# Patient Record
Sex: Male | Born: 1950
Health system: Southern US, Community
[De-identification: ages and names within clinical notes are randomized; demographics above are authoritative.]

## PROBLEM LIST (undated history)

## (undated) DIAGNOSIS — E785 Hyperlipidemia, unspecified: Secondary | ICD-10-CM

## (undated) DIAGNOSIS — M1990 Unspecified osteoarthritis, unspecified site: Secondary | ICD-10-CM

## (undated) DIAGNOSIS — K219 Gastro-esophageal reflux disease without esophagitis: Secondary | ICD-10-CM

## (undated) DIAGNOSIS — B1920 Unspecified viral hepatitis C without hepatic coma: Secondary | ICD-10-CM

## (undated) DIAGNOSIS — I10 Essential (primary) hypertension: Secondary | ICD-10-CM

## (undated) DIAGNOSIS — T7840XA Allergy, unspecified, initial encounter: Secondary | ICD-10-CM

## (undated) DIAGNOSIS — N529 Male erectile dysfunction, unspecified: Secondary | ICD-10-CM

## (undated) HISTORY — DX: Allergy, unspecified, initial encounter: T78.40XA

## (undated) HISTORY — DX: Hyperlipidemia, unspecified: E78.5

## (undated) HISTORY — PX: HEMICOLECTOMY: SHX854

## (undated) HISTORY — PX: APPENDECTOMY: SHX54

## (undated) HISTORY — DX: Unspecified osteoarthritis, unspecified site: M19.90

## (undated) HISTORY — DX: Essential (primary) hypertension: I10

## (undated) HISTORY — DX: Unspecified viral hepatitis C without hepatic coma: B19.20

## (undated) HISTORY — DX: Gastro-esophageal reflux disease without esophagitis: K21.9

## (undated) HISTORY — DX: Male erectile dysfunction, unspecified: N52.9

---

## 2001-02-27 ENCOUNTER — Ambulatory Visit (HOSPITAL_COMMUNITY): Admission: RE | Admit: 2001-02-27 | Discharge: 2001-02-27 | Payer: Self-pay | Admitting: *Deleted

## 2001-02-27 ENCOUNTER — Encounter: Payer: Self-pay | Admitting: *Deleted

## 2003-10-06 ENCOUNTER — Ambulatory Visit (HOSPITAL_COMMUNITY): Admission: RE | Admit: 2003-10-06 | Discharge: 2003-10-06 | Payer: Self-pay | Admitting: General Surgery

## 2004-11-29 ENCOUNTER — Ambulatory Visit: Payer: Self-pay | Admitting: Family Medicine

## 2004-12-12 ENCOUNTER — Ambulatory Visit (HOSPITAL_COMMUNITY): Admission: RE | Admit: 2004-12-12 | Discharge: 2004-12-12 | Payer: Self-pay | Admitting: Family Medicine

## 2005-01-15 ENCOUNTER — Ambulatory Visit: Payer: Self-pay | Admitting: Family Medicine

## 2005-03-04 HISTORY — PX: OTHER SURGICAL HISTORY: SHX169

## 2005-03-25 ENCOUNTER — Encounter (HOSPITAL_COMMUNITY): Admission: RE | Admit: 2005-03-25 | Discharge: 2005-04-24 | Payer: Self-pay | Admitting: Orthopedic Surgery

## 2005-04-26 ENCOUNTER — Encounter (HOSPITAL_COMMUNITY): Admission: RE | Admit: 2005-04-26 | Discharge: 2005-05-26 | Payer: Self-pay | Admitting: Orthopedic Surgery

## 2005-05-28 ENCOUNTER — Encounter (HOSPITAL_COMMUNITY): Admission: RE | Admit: 2005-05-28 | Discharge: 2005-06-27 | Payer: Self-pay | Admitting: Orthopedic Surgery

## 2005-06-28 ENCOUNTER — Encounter (HOSPITAL_COMMUNITY): Admission: RE | Admit: 2005-06-28 | Discharge: 2005-08-02 | Payer: Self-pay | Admitting: Orthopedic Surgery

## 2005-07-11 ENCOUNTER — Ambulatory Visit: Payer: Self-pay | Admitting: Family Medicine

## 2005-12-19 ENCOUNTER — Ambulatory Visit: Payer: Self-pay | Admitting: Family Medicine

## 2006-02-03 ENCOUNTER — Ambulatory Visit: Payer: Self-pay | Admitting: Family Medicine

## 2006-04-30 ENCOUNTER — Ambulatory Visit: Payer: Self-pay | Admitting: Family Medicine

## 2006-07-31 ENCOUNTER — Ambulatory Visit: Payer: Self-pay | Admitting: Family Medicine

## 2007-01-08 ENCOUNTER — Encounter: Payer: Self-pay | Admitting: Family Medicine

## 2007-01-08 LAB — CONVERTED CEMR LAB
BUN: 12 mg/dL (ref 6–23)
Basophils Absolute: 0 10*3/uL (ref 0.0–0.1)
Basophils Relative: 0 % (ref 0–1)
CO2: 29 meq/L (ref 19–32)
Calcium: 9.4 mg/dL (ref 8.4–10.5)
Chloride: 102 meq/L (ref 96–112)
Cholesterol: 206 mg/dL — ABNORMAL HIGH (ref 0–200)
Creatinine, Ser: 1.42 mg/dL (ref 0.40–1.50)
Eosinophils Absolute: 0.2 10*3/uL (ref 0.0–0.7)
Eosinophils Relative: 5 % (ref 0–5)
Glucose, Bld: 86 mg/dL (ref 70–99)
HCT: 44.6 % (ref 39.0–52.0)
HDL: 54 mg/dL (ref >39)
Hemoglobin: 14.4 g/dL (ref 13.0–17.0)
LDL Cholesterol: 133 mg/dL — ABNORMAL HIGH (ref 0–99)
Lymphocytes Relative: 46 % (ref 12–46)
Lymphs Abs: 2.1 10*3/uL (ref 0.7–3.3)
MCHC: 32.3 g/dL (ref 30.0–36.0)
MCV: 86.8 fL (ref 78.0–100.0)
Monocytes Absolute: 0.4 10*3/uL (ref 0.2–0.7)
Monocytes Relative: 9 % (ref 3–11)
Neutro Abs: 1.8 10*3/uL (ref 1.7–7.7)
Neutrophils Relative %: 40 % — ABNORMAL LOW (ref 43–77)
PSA: 0.6 ng/mL (ref 0.10–4.00)
Platelets: 253 10*3/uL (ref 150–400)
Potassium: 4.8 meq/L (ref 3.5–5.3)
RBC: 5.14 M/uL (ref 4.22–5.81)
RDW: 14 % (ref 11.5–14.0)
Sodium: 139 meq/L (ref 135–145)
Total CHOL/HDL Ratio: 3.8
Triglycerides: 93 mg/dL (ref <150)
VLDL: 19 mg/dL (ref 0–40)
WBC: 4.5 10*3/uL (ref 4.0–10.5)

## 2007-01-20 ENCOUNTER — Ambulatory Visit: Payer: Self-pay | Admitting: Family Medicine

## 2007-01-27 ENCOUNTER — Ambulatory Visit (HOSPITAL_COMMUNITY): Admission: RE | Admit: 2007-01-27 | Discharge: 2007-01-27 | Payer: Self-pay | Admitting: Family Medicine

## 2007-03-09 ENCOUNTER — Ambulatory Visit (HOSPITAL_COMMUNITY): Admission: RE | Admit: 2007-03-09 | Discharge: 2007-03-09 | Payer: Self-pay | Admitting: Family Medicine

## 2007-03-17 ENCOUNTER — Ambulatory Visit: Payer: Self-pay | Admitting: Family Medicine

## 2007-09-10 ENCOUNTER — Ambulatory Visit: Payer: Self-pay | Admitting: Family Medicine

## 2008-01-13 DIAGNOSIS — I10 Essential (primary) hypertension: Secondary | ICD-10-CM

## 2008-01-13 DIAGNOSIS — E785 Hyperlipidemia, unspecified: Secondary | ICD-10-CM

## 2008-01-15 ENCOUNTER — Encounter: Payer: Self-pay | Admitting: Family Medicine

## 2008-01-15 LAB — CONVERTED CEMR LAB
ALT: 42 units/L (ref 0–53)
Albumin: 4.4 g/dL (ref 3.5–5.2)
Basophils Absolute: 0 10*3/uL (ref 0.0–0.1)
Basophils Relative: 1 % (ref 0–1)
Chloride: 99 meq/L (ref 96–112)
Cholesterol: 196 mg/dL (ref 0–200)
Eosinophils Absolute: 0.2 10*3/uL (ref 0.0–0.7)
HDL: 61 mg/dL (ref >39)
Indirect Bilirubin: 1.4 mg/dL — ABNORMAL HIGH (ref 0.0–0.9)
MCHC: 33.3 g/dL (ref 30.0–36.0)
MCV: 83.7 fL (ref 78.0–100.0)
Neutro Abs: 2.2 10*3/uL (ref 1.7–7.7)
Neutrophils Relative %: 42 % — ABNORMAL LOW (ref 43–77)
PSA: 0.66 ng/mL (ref 0.10–4.00)
Platelets: 227 10*3/uL (ref 150–400)
Potassium: 4.1 meq/L (ref 3.5–5.3)
Sodium: 137 meq/L (ref 135–145)
Total CHOL/HDL Ratio: 3.2
Total Protein: 7.3 g/dL (ref 6.0–8.3)
Triglycerides: 60 mg/dL (ref <150)
VLDL: 12 mg/dL (ref 0–40)

## 2008-01-19 ENCOUNTER — Ambulatory Visit: Payer: Self-pay | Admitting: Family Medicine

## 2008-01-19 LAB — CONVERTED CEMR LAB: Hepatitis B Surface Ag: NEGATIVE

## 2008-02-24 ENCOUNTER — Encounter: Payer: Self-pay | Admitting: Family Medicine

## 2008-02-24 LAB — CONVERTED CEMR LAB: AST: 37 units/L (ref 0–37)

## 2008-04-27 ENCOUNTER — Ambulatory Visit: Payer: Self-pay | Admitting: Family Medicine

## 2008-07-13 ENCOUNTER — Ambulatory Visit: Payer: Self-pay | Admitting: Family Medicine

## 2008-07-14 ENCOUNTER — Encounter: Payer: Self-pay | Admitting: Family Medicine

## 2008-07-20 ENCOUNTER — Encounter (HOSPITAL_COMMUNITY): Admission: RE | Admit: 2008-07-20 | Discharge: 2008-08-01 | Payer: Self-pay | Admitting: Family Medicine

## 2008-09-13 ENCOUNTER — Ambulatory Visit: Payer: Self-pay | Admitting: Family Medicine

## 2008-12-07 ENCOUNTER — Ambulatory Visit: Payer: Self-pay | Admitting: Family Medicine

## 2008-12-07 DIAGNOSIS — R5381 Other malaise: Secondary | ICD-10-CM | POA: Insufficient documentation

## 2008-12-07 DIAGNOSIS — R5383 Other fatigue: Secondary | ICD-10-CM

## 2009-01-28 ENCOUNTER — Encounter: Payer: Self-pay | Admitting: Family Medicine

## 2009-01-30 LAB — CONVERTED CEMR LAB
Basophils Relative: 1 % (ref 0–1)
CO2: 27 meq/L (ref 19–32)
Eosinophils Relative: 7 % — ABNORMAL HIGH (ref 0–5)
Glucose, Bld: 92 mg/dL (ref 70–99)
HCT: 41.6 % (ref 39.0–52.0)
Hemoglobin: 13.5 g/dL (ref 13.0–17.0)
MCHC: 32.5 g/dL (ref 30.0–36.0)
Monocytes Absolute: 0.4 10*3/uL (ref 0.1–1.0)
Monocytes Relative: 10 % (ref 3–12)
Neutro Abs: 1.2 10*3/uL — ABNORMAL LOW (ref 1.7–7.7)
PSA: 0.75 ng/mL (ref 0.10–4.00)
Potassium: 4 meq/L (ref 3.5–5.3)
RBC: 4.9 M/uL (ref 4.22–5.81)
Sodium: 141 meq/L (ref 135–145)
Total CHOL/HDL Ratio: 3.6
VLDL: 13 mg/dL (ref 0–40)

## 2009-06-12 ENCOUNTER — Ambulatory Visit: Payer: Self-pay | Admitting: Family Medicine

## 2009-06-29 ENCOUNTER — Encounter: Payer: Self-pay | Admitting: Internal Medicine

## 2009-07-14 ENCOUNTER — Ambulatory Visit (HOSPITAL_COMMUNITY): Admission: RE | Admit: 2009-07-14 | Discharge: 2009-07-14 | Payer: Self-pay | Admitting: Internal Medicine

## 2009-07-14 ENCOUNTER — Ambulatory Visit: Payer: Self-pay | Admitting: Internal Medicine

## 2009-07-14 ENCOUNTER — Encounter: Payer: Self-pay | Admitting: Internal Medicine

## 2009-07-17 ENCOUNTER — Encounter: Payer: Self-pay | Admitting: Internal Medicine

## 2009-07-17 ENCOUNTER — Ambulatory Visit (HOSPITAL_COMMUNITY): Admission: RE | Admit: 2009-07-17 | Discharge: 2009-07-17 | Payer: Self-pay | Admitting: Internal Medicine

## 2009-07-19 ENCOUNTER — Ambulatory Visit (HOSPITAL_COMMUNITY): Admission: RE | Admit: 2009-07-19 | Discharge: 2009-07-19 | Payer: Self-pay | Admitting: Internal Medicine

## 2009-07-19 ENCOUNTER — Encounter: Payer: Self-pay | Admitting: Internal Medicine

## 2009-07-24 ENCOUNTER — Encounter: Payer: Self-pay | Admitting: Internal Medicine

## 2009-08-14 ENCOUNTER — Encounter (INDEPENDENT_AMBULATORY_CARE_PROVIDER_SITE_OTHER): Payer: Self-pay | Admitting: General Surgery

## 2009-08-14 ENCOUNTER — Inpatient Hospital Stay (HOSPITAL_COMMUNITY): Admission: RE | Admit: 2009-08-14 | Discharge: 2009-08-20 | Payer: Self-pay | Admitting: General Surgery

## 2009-08-17 ENCOUNTER — Encounter: Payer: Self-pay | Admitting: Internal Medicine

## 2009-08-17 ENCOUNTER — Encounter (INDEPENDENT_AMBULATORY_CARE_PROVIDER_SITE_OTHER): Payer: Self-pay

## 2010-01-31 ENCOUNTER — Ambulatory Visit: Payer: Self-pay | Admitting: Family Medicine

## 2010-02-05 LAB — CONVERTED CEMR LAB
Chloride: 97 meq/L (ref 96–112)
Creatinine, Ser: 1.28 mg/dL (ref 0.40–1.50)
HDL: 62 mg/dL (ref >39)
LDL Cholesterol: 121 mg/dL — ABNORMAL HIGH (ref 0–99)
Lymphocytes Relative: 46 % (ref 12–46)
Lymphs Abs: 1.8 10*3/uL (ref 0.7–4.0)
Neutro Abs: 1.6 10*3/uL — ABNORMAL LOW (ref 1.7–7.7)
Neutrophils Relative %: 40 % — ABNORMAL LOW (ref 43–77)
Platelets: 247 10*3/uL (ref 150–400)
Potassium: 4.1 meq/L (ref 3.5–5.3)
Triglycerides: 108 mg/dL (ref <150)
VLDL: 22 mg/dL (ref 0–40)
WBC: 3.8 10*3/uL — ABNORMAL LOW (ref 4.0–10.5)

## 2010-08-23 ENCOUNTER — Ambulatory Visit: Payer: Self-pay | Admitting: Family Medicine

## 2010-09-01 DIAGNOSIS — K819 Cholecystitis, unspecified: Secondary | ICD-10-CM | POA: Insufficient documentation

## 2010-12-04 NOTE — Assessment & Plan Note (Signed)
Summary: OV   Vital Signs:  Patient profile:   60 year old male Height:      72 inches Weight:      181 pounds BMI:     24.64 O2 Sat:      99 % Pulse rate:   76 / minute Pulse rhythm:   regular Resp:     16 per minute BP sitting:   124 / 82  (left arm) Cuff size:   large  Vitals Entered By: Everitt Amber LPN (January 31, 2010 3:17 PM)  Nutrition Counseling: Patient's BMI is greater than 25 and therefore counseled on weight management options. CC: Follow up chronic problems Is Patient Diabetic? No Pain Assessment Patient in pain? no        CC:  Follow up chronic problems.  History of Present Illness: Reports  that he has been doing well. Denies recent fever or chills. Denies sinus pressure, nasal congestion , ear pain or sore throat. Denies chest congestion, or cough productive of sputum. Denies chest pain, palpitations, PND, orthopnea or leg swelling. Denies abdominal pain, nausea, vomitting, diarrhea or constipation.  Denies change in bowel movements or bloody stool. Denies dysuria , frequency, incontinence or hesitancy. Denies  joint pain, swelling, or reduced mobility. Denies headaches, vertigo, seizures. Denies depression, anxiety or insomnia. Denies  rash, lesions, or itch.     Current Medications (verified): 1)  Maxzide 75-50 Mg Tabs (Triamterene-Hctz) .... Take 1 Tablet By Mouth Once A Day  Allergies (verified): No Known Drug Allergies  Past History:  Past Medical History: HTN   1988 approx Hyperlipidemia  Allergic rhinitis  Past Surgical History: Surgery Right shoulder (5/06) appendectomy, right hemicolectomy  08/14/2009  Social History: Occupation: English as a second language teacher Married TWo sons Former Smoker, quit in 1970's Alcohol use-no Drug use-no  Review of Systems      See HPI Eyes:  Denies blurring and discharge. Heme:  Denies abnormal bruising and bleeding. Allergy:  Complains of seasonal allergies.  Physical Exam  General:   Well-developed,well-nourished,in no acute distress; alert,appropriate and cooperative throughout examination HEENT: No facial asymmetry,  EOMI, No sinus tenderness, TM's Clear, oropharynx  pink and moist.   Chest: Clear to auscultation bilaterally.  CVS: S1, S2, No murmurs, No S3.   Abd: Soft, Nontender.  MS: Adequate ROM spine, hips, shoulders and knees.  Ext: No edema.   CNS: CN 2-12 intact, power tone and sensation normal throughout.   Skin: Intact, no visible lesions or rashes.  Psych: Good eye contact, normal affect.  Memory intact, not anxious or depressed appearing.    Impression & Recommendations:  Problem # 1:  ERECTILE DYSFUNCTION (ICD-302.72) Assessment Unchanged  The following medications were removed from the medication list:    Cialis 20 Mg Tabs (Tadalafil) .Marland Kitchen... Take 1 tablet by mouth once a day  as needed His updated medication list for this problem includes:    Cialis 20 Mg Tabs (Tadalafil) .Marland Kitchen... Take 1 tablet by mouth once a day as needed  Problem # 2:  DYSLIPIDEMIA (ICD-272.4) Assessment: Comment Only  Orders: T-Lipid Profile [phone])  Labs Reviewed: SGOT: 37 (02/24/2008)   SGPT: 35 (02/24/2008)   HDL:62 (01/29/2010), 48 (01/28/2009)  LDL:121 (01/29/2010), 111 (01/28/2009)  Chol:205 (01/29/2010), 172 (01/28/2009)  Trig:108 (01/29/2010), 65 (01/28/2009)low fat diet and regular exercise discussed and encouraged  Problem # 3:  HYPERTENSION (ICD-401.9) Assessment: Unchanged  His updated medication list for this problem includes:    Maxzide 75-50 Mg Tabs (Triamterene-hctz) .Marland Kitchen... Take 1 tablet by mouth once  a day  Orders: T-Basic Metabolic Panel [id number])  BP today: 124/82 Prior BP: 120/70 (06/12/2009)  Labs Reviewed: K+: 4.1 (01/29/2010) Creat: : 1.28 (01/29/2010)   Chol: 205 (01/29/2010)   HDL: 62 (01/29/2010)   LDL: 121 (01/29/2010)   TG: 108 (01/29/2010)  Complete Medication List: 1)  Maxzide 75-50 Mg Tabs (Triamterene-hctz) .... Take 1  tablet by mouth once a day 2)  Flonase 50 Mcg/act Susp (Fluticasone propionate) .... 2 puffs pwer nostril daily as needed 3)  Cialis 20 Mg Tabs (Tadalafil) .... Take 1 tablet by mouth once a day as needed  Patient Instructions: 1)   CPE in 5 months.3.5 weeks 2)  Lipid Panel prior to visit, ICD-9: 3)  BMP prior to visit, ICD-9: 4)  Your bP is great , no med changes. 5)  It is important that you exercise regularly at least 20 minutes 5 times a week. If you develop chest pain, have severe difficulty breathing, or feel very tired , stop exercising immediately and seek medical attention. Prescriptions: CIALIS 20 MG TABS (TADALAFIL) Take 1 tablet by mouth once a day as needed  #7 x 5   Entered by:   Everitt Amber LPN   Authorized by:   Syliva Overman MD   Signed by:   Everitt Amber LPN on 09/81/1914   Method used:   [facility]ically to        [facility] [phone]* (retail)       [address]       [place], [place]  [address]       Ph: [phone] or [phone]       Fax: [phone]   RxID:   [id number] FLONASE 50 MCG/ACT SUSP (FLUTICASONE PROPIONATE) 2 puffs pwer nostril daily as needed  #1 x 3   Entered and Authorized by:   Syliva Overman MD   Signed by:   Syliva Overman MD on 01/31/2010   Method used:   Electronically to        [facility] [phone]* (retail)       [address]       [place], [place]  [address]       Ph: [phone] or [phone]       Fax: [phone]   RxID:   [id number]

## 2010-12-04 NOTE — Assessment & Plan Note (Signed)
Summary: office visit   Vital Signs:  Patient profile:   60 year old male Height:      72 inches Weight:      183.75 pounds BMI:     25.01 O2 Sat:      99 % on Room air Pulse rate:   73 / minute Pulse rhythm:   regular Resp:     16 per minute BP sitting:   140 / 84  (left arm)  Vitals Entered By: Adella Hare LPN (August 23, 2010 2:30 PM)  Nutrition Counseling: Patient's BMI is greater than 25 and therefore counseled on weight management options.  O2 Flow:  Room air CC: follow-up visit Is Patient Diabetic? No Pain Assessment Patient in pain? no        CC:  follow-up visit.  History of Present Illness: Reports  thathe has been  doing well. Denies recent fever or chills. Denies sinus pressure, nasal congestion , ear pain or sore throat. Denies chest congestion, or cough productive of sputum. Denies chest pain, palpitations, PND, orthopnea or leg swelling. Denies abdominal pain, nausea, vomitting, diarrhea or constipation. Denies change in bowel movements or bloody stool. Denies dysuria , frequency, incontinence or hesitancy. Denies  joint pain, swelling, or reduced mobility. Denies headaches, vertigo, seizures. Denies depression, anxiety or insomnia. Denies  rash, lesions, or itch.     Current Medications (verified): 1)  Maxzide 75-50 Mg Tabs (Triamterene-Hctz) .... Take 1 Tablet By Mouth Once A Day 2)  Flonase 50 Mcg/act Susp (Fluticasone Propionate) .... 2 Puffs Pwer Nostril Daily As Needed 3)  Cialis 20 Mg Tabs (Tadalafil) .... Take 1 Tablet By Mouth Once A Day As Needed  Allergies (verified): No Known Drug Allergies  Review of Systems      See HPI General:  Denies weakness. Eyes:  Denies discharge and red eye. Endo:  Denies cold intolerance, excessive hunger, excessive thirst, excessive urination, and polyuria. Heme:  Denies abnormal bruising and bleeding. Allergy:  Complains of seasonal allergies.  Physical Exam  General:   Well-developed,well-nourished,in no acute distress; alert,appropriate and cooperative throughout examination HEENT: No facial asymmetry,  EOMI, No sinus tenderness, TM's Clear, oropharynx  pink and moist.   Chest: Clear to auscultation bilaterally.  CVS: S1, S2, No murmurs, No S3.   Abd: Soft, Nontender.  MS: Adequate ROM spine, hips, shoulders and knees.  Ext: No edema.   CNS: CN 2-12 intact, power tone and sensation normal throughout.   Skin: Intact, no visible lesions or rashes.  Psych: Good eye contact, normal affect.  Memory intact, not anxious or depressed appearing.    Impression & Recommendations:  Problem # 1:  HYPERTENSION (ICD-401.9) Assessment Deteriorated  His updated medication list for this problem includes:    Maxzide 75-50 Mg Tabs (Triamterene-hctz) .Marland Kitchen... Take 1 tablet by mouth once a day Patient advised to follow low sodium diet rich in fruit and vegetables, and to commit to at least 30 minutes 5 days per week of regular exercise , to improve blood presure control.   Orders: T-Basic Metabolic Panel [id number])  BP today: 140/84 Prior BP: 124/82 (01/31/2010)  Labs Reviewed: K+: 4.1 (01/29/2010) Creat: : 1.28 (01/29/2010)   Chol: 205 (01/29/2010)   HDL: 62 (01/29/2010)   LDL: 121 (01/29/2010)   TG: 108 (01/29/2010)  Problem # 2:  DYSLIPIDEMIA (ICD-272.4) Assessment: Comment Only  Orders: T-Lipid Profile ([id number]) T-Hepatic Function [id number]) Low fat dietdiscussed and encouraged  Labs Reviewed: SGOT: 37 (02/24/2008)   SGPT: 35 (02/24/2008)   HDL:62 (01/29/2010),  48 (01/28/2009)  LDL:121 (01/29/2010), 111 (01/28/2009)  Chol:205 (01/29/2010), 172 (01/28/2009)  Trig:108 (01/29/2010), 65 (01/28/2009)  Problem # 3:  CHOLECYSTITIS, ACALCULOUS (ICD-575.10) Assessment: Unchanged intermittent dyspepsia and nausea, EF was less than 10% in 2010  Complete Medication List: 1)  Maxzide 75-50 Mg Tabs (Triamterene-hctz) .... Take 1 tablet by mouth once a  day 2)  Flonase 50 Mcg/act Susp (Fluticasone propionate) .... 2 puffs pwer nostril daily as needed 3)  Cialis 20 Mg Tabs (Tadalafil) .... Take 1 tablet by mouth once a day as needed  Other Orders: T-CBC w/Diff ([id number]) T-TSH (09811-91478)  Patient Instructions: 1)  Please schedule a follow-up appointment in 5.5 months. 2)  It is important that you exercise regularly at least 30 minutes 5 times a week. If you develop chest pain, have severe difficulty breathing, or feel very tired , stop exercising immediately and seek medical attention. 3)  You need to cut back on sodium so that your blood  pressure improves. Today BP is 140/80  4)  BMP prior to visit, ICD-9: 5)  Hepatic Panel prior to visit, ICD-9:  fasting in 5.5 months. 6)  Lipid Panel prior to visit, ICD-9: 7)  TSH prior to visit, ICD-9: 8)  CBC w/ Diff prior to visit, ICD-9: 9)  Continue the good work, and enjoy your vacation. 10)  PSA prior to visit, ICD-9:   Orders Added: 1)  Est. Patient Level IV [29562] 2)  T-Basic Metabolic Panel [80048-22910] 3)  T-Lipid Profile [80061-22930] 4)  T-Hepatic Function [80076-22960] 5)  T-CBC w/Diff [13086-57846] 6)  T-TSH [96295-28413]

## 2011-01-08 ENCOUNTER — Telehealth (INDEPENDENT_AMBULATORY_CARE_PROVIDER_SITE_OTHER): Payer: Self-pay | Admitting: *Deleted

## 2011-01-09 ENCOUNTER — Other Ambulatory Visit: Payer: Self-pay | Admitting: Family Medicine

## 2011-01-09 ENCOUNTER — Ambulatory Visit (INDEPENDENT_AMBULATORY_CARE_PROVIDER_SITE_OTHER): Payer: Medicare HMO | Admitting: Family Medicine

## 2011-01-09 ENCOUNTER — Encounter: Payer: Self-pay | Admitting: Family Medicine

## 2011-01-09 DIAGNOSIS — H109 Unspecified conjunctivitis: Secondary | ICD-10-CM

## 2011-01-09 DIAGNOSIS — J0110 Acute frontal sinusitis, unspecified: Secondary | ICD-10-CM | POA: Insufficient documentation

## 2011-01-09 DIAGNOSIS — I10 Essential (primary) hypertension: Secondary | ICD-10-CM

## 2011-01-09 LAB — CBC WITH DIFFERENTIAL/PLATELET
Basophils Relative: 0 % (ref 0–1)
Eosinophils Absolute: 0 10*3/uL (ref 0.0–0.7)
Eosinophils Relative: 0 % (ref 0–5)
Hemoglobin: 14 g/dL (ref 13.0–17.0)
MCH: 28.1 pg (ref 26.0–34.0)
MCHC: 32.9 g/dL (ref 30.0–36.0)
Monocytes Relative: 10 % (ref 3–12)
Neutrophils Relative %: 75 % (ref 43–77)

## 2011-01-09 LAB — BASIC METABOLIC PANEL
BUN: 15 mg/dL (ref 6–23)
Calcium: 9.3 mg/dL (ref 8.4–10.5)
Creat: 1.36 mg/dL (ref 0.40–1.50)
Glucose, Bld: 84 mg/dL (ref 70–99)
Sodium: 136 mEq/L (ref 135–145)

## 2011-01-09 LAB — HEPATIC FUNCTION PANEL
ALT: 23 U/L (ref 0–53)
AST: 23 U/L (ref 0–37)
Alkaline Phosphatase: 59 U/L (ref 39–117)
Bilirubin, Direct: 0.3 mg/dL (ref 0.0–0.3)
Indirect Bilirubin: 1.3 mg/dL — ABNORMAL HIGH (ref 0.0–0.9)

## 2011-01-09 LAB — LIPID PANEL
Cholesterol: 202 mg/dL — ABNORMAL HIGH (ref 0–200)
Total CHOL/HDL Ratio: 3.2 Ratio

## 2011-01-10 LAB — CONVERTED CEMR LAB
ALT: 23 units/L (ref 0–53)
AST: 23 units/L (ref 0–37)
Alkaline Phosphatase: 59 units/L (ref 39–117)
Basophils Absolute: 0 10*3/uL (ref 0.0–0.1)
Basophils Relative: 0 % (ref 0–1)
Bilirubin, Direct: 0.3 mg/dL (ref 0.0–0.3)
Calcium: 9.3 mg/dL (ref 8.4–10.5)
Cholesterol: 202 mg/dL — ABNORMAL HIGH (ref 0–200)
Creatinine, Ser: 1.36 mg/dL (ref 0.40–1.50)
Eosinophils Absolute: 0 10*3/uL (ref 0.0–0.7)
Eosinophils Relative: 0 % (ref 0–5)
Glucose, Bld: 84 mg/dL (ref 70–99)
HCT: 42.6 % (ref 39.0–52.0)
HDL: 64 mg/dL (ref >39)
Hemoglobin: 14 g/dL (ref 13.0–17.0)
MCV: 85.4 fL (ref 78.0–100.0)
Monocytes Absolute: 1.6 10*3/uL — ABNORMAL HIGH (ref 0.1–1.0)
Platelets: 208 10*3/uL (ref 150–400)
RDW: 13.6 % (ref 11.5–15.5)
Sodium: 136 meq/L (ref 135–145)
TSH: 1.241 microintl units/mL (ref 0.350–4.500)
Total Bilirubin: 1.6 mg/dL — ABNORMAL HIGH (ref 0.3–1.2)
Total CHOL/HDL Ratio: 3.2
Total Protein: 7.4 g/dL (ref 6.0–8.3)

## 2011-01-15 NOTE — Progress Notes (Signed)
Summary: sick  Phone Note Call from Patient   Summary of Call: he is having chills , head hurts eye turned red and in the morining eye is sticking together. Feel so bad he does not think he will go to work tomorrow. This is not like him. Wants to come see Dr. Lodema Hong call back at [phone] Initial call taken by: Lind Guest,  January 08, 2011 4:11 PM  Follow-up for Phone Call        please work in tomorrow Follow-up by: Adella Hare LPN,  January 08, 2011 4:18 PM  Additional Follow-up for Phone Call Additional follow up Details #1::        will be here at 8:00 in the morning Additional Follow-up by: Lind Guest,  January 08, 2011 4:42 PM

## 2011-01-15 NOTE — Letter (Signed)
Summary: Out of Work  [facility]  [address]   [place], [place] [address]   Phone: [phone]  Fax: [phone]    January 09, 2011   Employee:  CLEMENTE DEWEY    To Whom It May Concern:   For Medical reasons, please excuse the above named employee from work for the following dates:  Start:   01/08/11  End:   01/11/11 to return with no restrictions  If you need additional information, please feel free to contact our office.         Sincerely,    Milus Mallick. Lodema Hong, MD

## 2011-01-22 NOTE — Assessment & Plan Note (Signed)
Summary: sick   Vital Signs:  Patient profile:   60 year old male Height:      72 inches Weight:      182.13 pounds BMI:     24.79 O2 Sat:      99 % Pulse rate:   87 / minute Pulse rhythm:   regular Resp:     16 per minute BP sitting:   120 / 78  (left arm) Cuff size:   large  Vitals Entered By: Everitt Amber LPN (January 08, 6269 8:05 AM) CC: started getting a headache monday and some drainage, has dark brown mucus in sinuses   CC:  started getting a headache monday and some drainage and has dark brown mucus in sinuses.  History of Present Illness: 3 day h/o frontal pressure, started Monday night on the way from work. Green post nasal bloody drainage, fevr and chills x 2 days, malaise. Slept entire moning yesterday. Reports  that prior to this he had been ding well.  Denies chest congestion, or cough productive of sputum. Denies chest pain, palpitations, PND, orthopnea or leg swelling. Denies abdominal pain, nausea, vomitting, diarrhea or constipation. Denies change in bowel movements or bloody stool. Denies dysuria , frequency, incontinence or hesitancy. Denies  joint pain, swelling, or reduced mobility. Denies headaches, vertigo, seizures. Denies depression, anxiety or insomnia. Denies  rash, lesions, or itch.     Current Medications (verified): 1)  Maxzide 75-50 Mg Tabs (Triamterene-Hctz) .... Take 1 Tablet By Mouth Once A Day 2)  Flonase 50 Mcg/act Susp (Fluticasone Propionate) .... 2 Puffs Pwer Nostril Daily As Needed 3)  Cialis 20 Mg Tabs (Tadalafil) .... Take 1 Tablet By Mouth Once A Day As Needed  Allergies (verified): No Known Drug Allergies  Review of Systems      See HPI General:  Complains of chills, fatigue, fever, loss of appetite, malaise, sweats, and weakness. Eyes:  Complains of blurring and red eye; left red eye with sticky drainage x 2 days. Endo:  Denies cold intolerance, excessive hunger, and excessive thirst. Heme:  Denies abnormal bruising,  bleeding, and enlarge lymph nodes. Allergy:  Complains of seasonal allergies.  Physical Exam  General:  Well- hEENT:Positive   sinus tenderness, TM's Clear, oropharynx  pink and moist. erythema and drainage from left  conjunctivum  Chest: Clear to auscultation bilaterally.  CVS: S1, S2, No murmurs, No S3.   Abd: Soft, Nontender.  MS: Adequate ROM spine, hips, shoulders and knees.  Ext: No edema.   CNS: CN 2-12 intact, power tone and sensation normal throughout.   Skin: Intact, no visible lesions or rashes.  Psych: Good eye contact, normal affect.  Memory intact, not anxious or depressed appearing.    Impression & Recommendations:  Problem # 1:  ACUTE FRONTAL SINUSITIS (ICD-461.1) Assessment Comment Only  His updated medication list for this problem includes:    Flonase 50 Mcg/act Susp (Fluticasone propionate) .Marland Kitchen... 2 puffs pwer nostril daily as needed    Septra Ds 800-160 Mg Tabs (Sulfamethoxazole-trimethoprim) .Marland Kitchen... Take 1 tablet by mouth two times a day  Orders: Rocephin  250mg  (J5009) Admin of Therapeutic Inj  intramuscular or subcutaneous (38182)  Problem # 2:  CONJUNCTIVITIS (ICD-372.30) Assessment: Comment Only  The following medications were removed from the medication list:    Genoptic 0.3 % Soln (Gentamicin sulfate) His updated medication list for this problem includes:    Genoptic 0.3 % Soln (Gentamicin sulfate) ..... One drop every 4 hours while awake to left eye x  5 days  Problem # 3:  HYPERTENSION (ICD-401.9) Assessment: Improved  His updated medication list for this problem includes:    Maxzide 75-50 Mg Tabs (Triamterene-hctz) .Marland Kitchen... Take 1 tablet by mouth once a day  BP today: 120/78 Prior BP: 140/84 (08/23/2010)  Labs Reviewed: K+: 4.1 (01/29/2010) Creat: : 1.28 (01/29/2010)   Chol: 205 (01/29/2010)   HDL: 62 (01/29/2010)   LDL: 121 (01/29/2010)   TG: 108 (01/29/2010)  Complete Medication List: 1)  Maxzide 75-50 Mg Tabs (Triamterene-hctz) .... Take  1 tablet by mouth once a day 2)  Flonase 50 Mcg/act Susp (Fluticasone propionate) .... 2 puffs pwer nostril daily as needed 3)  Cialis 20 Mg Tabs (Tadalafil) .... Take 1 tablet by mouth once a day as needed 4)  Septra Ds 800-160 Mg Tabs (Sulfamethoxazole-trimethoprim) .... Take 1 tablet by mouth two times a day 5)  Genoptic 0.3 % Soln (Gentamicin sulfate) .... One drop every 4 hours while awake to left eye x 5 days  Patient Instructions: 1)  Cancel March  appt, and pls resched for 5.5 months 2)  You have acute    sinusitis and left conjunctivitis 3)  Meds are being sent in, and you will get Rocephin injection in the office 4)  Work excuse from 03/06 to 01/11/2011. 5)  Clean any discharge from eyelids with baby shampoo and warm water. Be sure to wash your hands often  to avoid spreading and reinfection. If you wear contacts, remove them and wear glasses until infection resolved (be sure and clean lenses before replacing).  Prescriptions: GENOPTIC 0.3 % SOLN (GENTAMICIN SULFATE) one drop every 4 hours while awake to left eye x 5 days  #109ml x 0   Entered and Authorized by:   Syliva Overman MD   Signed by:   Syliva Overman MD on 01/09/2011   Method used:   Electronically to        [facility] [phone]* (retail)       [address]       [place], [place]  [address]       Ph: [phone]       Fax: [phone]   RxID:   [id number] SEPTRA DS 800-160 MG TABS (SULFAMETHOXAZOLE-TRIMETHOPRIM) Take 1 tablet by mouth two times a day  #20 x 0   Entered and Authorized by:   Syliva Overman MD   Signed by:   Syliva Overman MD on 01/09/2011   Method used:   Electronically to        [facility] [phone]* (retail)       [address]       [place], [place]  [address]       Ph: [phone]       Fax: [phone]   RxID:   [id number]    Medication Administration  Injection # 1:    Medication: Rocephin  250mg     Diagnosis: ACUTE FRONTAL  SINUSITIS (ICD-461.1)    Route: IM    Site: LUOQ gluteus    Exp Date: 08/14    Lot #: [id number]    Mfr: [email or web address]    Comments: rocephin 500mg  given    Patient tolerated injection without complications    Given by: Adella Hare LPN (January 09, 5731 8:39 AM)  Orders Added: 1)  Est. Patient Level IV [20254] 2)  Rocephin  250mg  [J0696] 3)  Admin of Therapeutic Inj  intramuscular  or subcutaneous [id number]     Medication Administration  Injection # 1:    Medication: Rocephin  250mg     Diagnosis: ACUTE FRONTAL SINUSITIS (ICD-461.1)    Route: IM    Site: LUOQ gluteus    Exp Date: 08/14    Lot #: [id number]    Mfr: [email or web address]    Comments: rocephin 500mg  given    Patient tolerated injection without complications    Given by: Adella Hare LPN (January 08, 453 8:39 AM)  Orders Added: 1)  Est. Patient Level IV [[address]11] 2)  Rocephin  250mg  [J0696] 3)  Admin of Therapeutic Inj  intramuscular or subcutaneous [91478]

## 2011-01-28 ENCOUNTER — Ambulatory Visit: Payer: Self-pay | Admitting: Family Medicine

## 2011-02-06 ENCOUNTER — Other Ambulatory Visit: Payer: Self-pay | Admitting: Family Medicine

## 2011-02-07 LAB — TYPE AND SCREEN: Antibody Screen: NEGATIVE

## 2011-02-07 LAB — DIFFERENTIAL
Basophils Absolute: 0 10*3/uL (ref 0.0–0.1)
Basophils Absolute: 0 10*3/uL (ref 0.0–0.1)
Basophils Absolute: 0 10*3/uL (ref 0.0–0.1)
Basophils Absolute: 0 10*3/uL (ref 0.0–0.1)
Basophils Absolute: 0 10*3/uL (ref 0.0–0.1)
Basophils Relative: 0 % (ref 0–1)
Basophils Relative: 0 % (ref 0–1)
Basophils Relative: 0 % (ref 0–1)
Basophils Relative: 1 % (ref 0–1)
Eosinophils Absolute: 0.2 10*3/uL (ref 0.0–0.7)
Eosinophils Absolute: 0 10*3/uL (ref 0.0–0.7)
Eosinophils Absolute: 0.1 10*3/uL (ref 0.0–0.7)
Eosinophils Relative: 5 % (ref 0–5)
Eosinophils Relative: 0 % (ref 0–5)
Eosinophils Relative: 0 % (ref 0–5)
Lymphocytes Relative: 6 % — ABNORMAL LOW (ref 12–46)
Lymphs Abs: 1 10*3/uL (ref 0.7–4.0)
Lymphs Abs: 1.1 10*3/uL (ref 0.7–4.0)
Monocytes Absolute: 0.5 10*3/uL (ref 0.1–1.0)
Monocytes Absolute: 0.6 10*3/uL (ref 0.1–1.0)
Monocytes Absolute: 1.1 10*3/uL — ABNORMAL HIGH (ref 0.1–1.0)
Monocytes Relative: 13 % — ABNORMAL HIGH (ref 3–12)
Monocytes Relative: 14 % — ABNORMAL HIGH (ref 3–12)
Monocytes Relative: 6 % (ref 3–12)
Monocytes Relative: 8 % (ref 3–12)
Neutro Abs: 2.1 10*3/uL (ref 1.7–7.7)
Neutro Abs: 2.4 10*3/uL (ref 1.7–7.7)
Neutrophils Relative %: 59 % (ref 43–77)
Neutrophils Relative %: 57 % (ref 43–77)
Neutrophils Relative %: 66 % (ref 43–77)

## 2011-02-07 LAB — CBC
HCT: 31 % — ABNORMAL LOW (ref 39.0–52.0)
HCT: 31.5 % — ABNORMAL LOW (ref 39.0–52.0)
HCT: 37.9 % — ABNORMAL LOW (ref 39.0–52.0)
Hemoglobin: 10.2 g/dL — ABNORMAL LOW (ref 13.0–17.0)
Hemoglobin: 10.6 g/dL — ABNORMAL LOW (ref 13.0–17.0)
Hemoglobin: 13.8 g/dL (ref 13.0–17.0)
MCHC: 34.8 g/dL (ref 30.0–36.0)
MCHC: 33.2 g/dL (ref 30.0–36.0)
MCHC: 33.8 g/dL (ref 30.0–36.0)
MCHC: 33.8 g/dL (ref 30.0–36.0)
MCHC: 33.9 g/dL (ref 30.0–36.0)
MCV: 84.6 fL (ref 78.0–100.0)
MCV: 85.4 fL (ref 78.0–100.0)
MCV: 86.2 fL (ref 78.0–100.0)
MCV: 86.4 fL (ref 78.0–100.0)
MCV: 86.5 fL (ref 78.0–100.0)
Platelets: 223 10*3/uL (ref 150–400)
Platelets: 210 10*3/uL (ref 150–400)
Platelets: 210 10*3/uL (ref 150–400)
Platelets: 213 10*3/uL (ref 150–400)
RBC: 3.7 MIL/uL — ABNORMAL LOW (ref 4.22–5.81)
RDW: 13.8 % (ref 11.5–15.5)
RDW: 13.4 % (ref 11.5–15.5)
RDW: 13.6 % (ref 11.5–15.5)
RDW: 13.8 % (ref 11.5–15.5)
RDW: 13.9 % (ref 11.5–15.5)
RDW: 14.1 % (ref 11.5–15.5)
WBC: 4.3 10*3/uL (ref 4.0–10.5)

## 2011-02-07 LAB — BASIC METABOLIC PANEL
BUN: 13 mg/dL (ref 6–23)
BUN: 15 mg/dL (ref 6–23)
BUN: 16 mg/dL (ref 6–23)
BUN: 7 mg/dL (ref 6–23)
CO2: 33 mEq/L — ABNORMAL HIGH (ref 19–32)
CO2: 34 mEq/L — ABNORMAL HIGH (ref 19–32)
CO2: 34 mEq/L — ABNORMAL HIGH (ref 19–32)
CO2: 35 mEq/L — ABNORMAL HIGH (ref 19–32)
Calcium: 8.7 mg/dL (ref 8.4–10.5)
Calcium: 8.9 mg/dL (ref 8.4–10.5)
Calcium: 9.5 mg/dL (ref 8.4–10.5)
Chloride: 97 mEq/L (ref 96–112)
Chloride: 95 mEq/L — ABNORMAL LOW (ref 96–112)
Chloride: 96 mEq/L (ref 96–112)
Chloride: 96 mEq/L (ref 96–112)
Chloride: 99 mEq/L (ref 96–112)
Creatinine, Ser: 1.13 mg/dL (ref 0.4–1.5)
Creatinine, Ser: 1.09 mg/dL (ref 0.4–1.5)
Creatinine, Ser: 1.11 mg/dL (ref 0.4–1.5)
Creatinine, Ser: 1.21 mg/dL (ref 0.4–1.5)
Creatinine, Ser: 1.27 mg/dL (ref 0.4–1.5)
GFR calc Af Amer: >60 mL/min (ref >60)
GFR calc non Af Amer: 58 mL/min — ABNORMAL LOW (ref >60)
GFR calc non Af Amer: >60 mL/min (ref >60)
Glucose, Bld: 102 mg/dL — ABNORMAL HIGH (ref 70–99)
Glucose, Bld: 102 mg/dL — ABNORMAL HIGH (ref 70–99)
Glucose, Bld: 104 mg/dL — ABNORMAL HIGH (ref 70–99)
Glucose, Bld: 143 mg/dL — ABNORMAL HIGH (ref 70–99)
Glucose, Bld: 93 mg/dL (ref 70–99)
Glucose, Bld: 98 mg/dL (ref 70–99)
Potassium: 3.8 mEq/L (ref 3.5–5.1)
Potassium: 3.4 mEq/L — ABNORMAL LOW (ref 3.5–5.1)
Potassium: 4 mEq/L (ref 3.5–5.1)
Sodium: 133 mEq/L — ABNORMAL LOW (ref 135–145)
Sodium: 139 mEq/L (ref 135–145)

## 2011-02-07 LAB — MAGNESIUM
Magnesium: 1.8 mg/dL (ref 1.5–2.5)
Magnesium: 1.8 mg/dL (ref 1.5–2.5)

## 2011-02-07 LAB — PHOSPHORUS
Phosphorus: 2.9 mg/dL (ref 2.3–4.6)
Phosphorus: 2.7 mg/dL (ref 2.3–4.6)

## 2011-02-08 LAB — CREATININE, SERUM: GFR calc Af Amer: >60 mL/min (ref >60)

## 2011-03-22 NOTE — Cardiovascular Report (Signed)
[place]. [facility]  Patient:    Bryce Martinez, Bryce Martinez                     MRN: [id number] Proc. Date: 02/27/01 Adm. Date:  [id number] Disc. Date: [id number] Attending:  Daisey Must CC:         Syliva Overman, M.D.  Thomas C. Wall, M.D. [facility]  Cardiac Catheterization Laboratory   Cardiac Catheterization  PROCEDURES PERFORMED:  Left heart catheterization with coronary angiography and left ventriculography.  INDICATIONS:  Mr. Wisler is a 59 year old male with cardiovascular risk factors, who has had chest pain.  A stress Cardiolite done in [place] was interpreted as revealing ischemia in the anteroseptal and inferoseptal walls. He was referred for cardiac catheterization.  DESCRIPTION OF PROCEDURE:  A 6 French sheath was placed in the right femoral artery.  Standard Judkins 6 French catheters were utilized.  Contrast was Omnipaque.  There were no complications.  RESULTS:  HEMODYNAMICS:  Left ventricular pressure 126/12.  Aortic pressure 120/80. There was no aortic valve gradient.  LEFT VENTRICULOGRAM:  Wall motion is normal.  Ejection fraction is calculated at 56%.  No mitral regurgitation.  CORONARY ARTERIOGRAPHY:  (Right dominant).  Left main:  Normal.  Left anterior descending:  The left anterior descending artery gives rise to a small first diagonal, normal second diagonal and a small third diagonal.  The LAD is free of angiographic disease.  Left circumflex:  The left circumflex gives rise to three normal sized obtuse marginal branches.  The left circumflex is free of angiographic disease.  Right coronary artery:  The right coronary artery is a dominant vessel giving rise to a large posterior descending artery, a small posterolateral branch. There is some mild catheter-induced spasm in the proximal vessel which was relieved with nitroglycerin.  There were residual minor luminal irregularities in the proximal vessel but otherwise no  angiographic abnormalities in the right coronary.  IMPRESSIONS: 1. Normal left ventricular systolic function. 2. Normal coronary arteries. DD:  02/27/01 TD:  03/01/01 Job: [id number]

## 2011-07-10 ENCOUNTER — Encounter: Payer: Self-pay | Admitting: Family Medicine

## 2011-07-11 ENCOUNTER — Ambulatory Visit (INDEPENDENT_AMBULATORY_CARE_PROVIDER_SITE_OTHER): Payer: Medicare HMO | Admitting: Family Medicine

## 2011-07-11 ENCOUNTER — Encounter: Payer: Self-pay | Admitting: Family Medicine

## 2011-07-11 VITALS — BP 128/84 | HR 68 | Resp 16 | Ht 72.0 in | Wt 188.4 lb

## 2011-07-11 DIAGNOSIS — M549 Dorsalgia, unspecified: Secondary | ICD-10-CM

## 2011-07-11 DIAGNOSIS — J309 Allergic rhinitis, unspecified: Secondary | ICD-10-CM | POA: Insufficient documentation

## 2011-07-11 DIAGNOSIS — K819 Cholecystitis, unspecified: Secondary | ICD-10-CM

## 2011-07-11 DIAGNOSIS — N529 Male erectile dysfunction, unspecified: Secondary | ICD-10-CM

## 2011-07-11 DIAGNOSIS — Z125 Encounter for screening for malignant neoplasm of prostate: Secondary | ICD-10-CM

## 2011-07-11 DIAGNOSIS — I10 Essential (primary) hypertension: Secondary | ICD-10-CM

## 2011-07-11 MED ORDER — FLUTICASONE PROPIONATE 50 MCG/ACT NA SUSP: 1.0000 | Freq: Every day | NASAL | Status: DC

## 2011-07-11 MED ORDER — TRIAMTERENE-HCTZ 75-50 MG PO TABS: 1.0000 | ORAL_TABLET | Freq: Every day | ORAL | Status: DC

## 2011-07-11 MED ORDER — TADALAFIL 5 MG PO TABS: 5.0000 mg | ORAL_TABLET | Freq: Every day | ORAL | Status: DC | PRN

## 2011-07-11 NOTE — Patient Instructions (Signed)
CPE in 6 months.  PSA today.  Continue regular exercise and a diet rich in fruit and vegetables.  Blood pressure is excellent.  Medication sent in for allergies, also for ED .  The cialis says once daily, only take as needed 2 tablets before intercourse

## 2011-07-12 LAB — PSA: PSA: 0.95 ng/mL (ref <=4.00)

## 2011-07-15 NOTE — Assessment & Plan Note (Signed)
Unchanged, med prescribed

## 2011-07-15 NOTE — Progress Notes (Signed)
  Subjective:    Patient ID: Bryce Martinez, male    DOB: 1951-01-31, 60 y.o.   MRN: [id number]  HPI The PT is here for follow up and re-evaluation of chronic medical conditions, medication management and review of any available recent lab and radiology data.  Preventive health is updated, specifically  Cancer screening and Immunization.   Questions or concerns regarding consultations or procedures which the PT has had in the interim are  addressed. The PT denies any adverse reactions to current medications since the last visit.  There are no new concerns.  There are no specific complaints       Review of Systems    See HPI Denies recent fever or chills. Denies sinus pressure, nasal congestion, ear pain or sore throat.reports increasedallegy symptoms, including nasal congestion and drainage, with increased sneezing. Denies chest congestion, productive cough or wheezing. Denies chest pains, palpitations and leg swelling Denies abdominal pain, nausea, vomiting,diarrhea or constipation.   Denies dysuria, frequency, hesitancy or incontinence. Intermittent low back pain, denies limitation in mobility. Denies headaches, seizures, numbness, or tingling. Denies depression, anxiety or insomnia. Denies skin break down or rash.     Objective:   Physical Exam Patient alert and oriented and in no cardiopulmonary distress.  HEENT: No facial asymmetry, EOMI, no sinus tenderness,  oropharynx pink and moist.  Neck supple no adenopathy.  Chest: Clear to auscultation bilaterally.  CVS: S1, S2 no murmurs, no S3.  ABD: Soft non tender. Bowel sounds normal.  Ext: No edema  MS: Adequate ROM spine, shoulders, hips and knees.  Skin: Intact, no ulcerations or rash noted.  Psych: Good eye contact, normal affect. Memory intact not anxious or depressed appearing.  CNS: CN 2-12 intact, power, tone and sensation normal throughout.        Assessment & Plan:

## 2011-07-15 NOTE — Assessment & Plan Note (Signed)
Controlled, no change in medication  

## 2011-07-15 NOTE — Assessment & Plan Note (Signed)
Increased symptoms, as expected in the Fall, flonase prescribed

## 2011-07-15 NOTE — Assessment & Plan Note (Signed)
Reports worsening symptoms, still no decision for cholecystectomy in the near fuutre

## 2011-07-24 ENCOUNTER — Other Ambulatory Visit: Payer: Self-pay | Admitting: Family Medicine

## 2012-01-08 ENCOUNTER — Encounter: Payer: Self-pay | Admitting: Family Medicine

## 2012-01-08 ENCOUNTER — Ambulatory Visit (INDEPENDENT_AMBULATORY_CARE_PROVIDER_SITE_OTHER): Payer: Medicare HMO | Admitting: Family Medicine

## 2012-01-08 VITALS — BP 130/90 | HR 76 | Resp 18 | Ht 72.0 in | Wt 193.1 lb

## 2012-01-08 DIAGNOSIS — M549 Dorsalgia, unspecified: Secondary | ICD-10-CM

## 2012-01-08 DIAGNOSIS — I10 Essential (primary) hypertension: Secondary | ICD-10-CM

## 2012-01-08 DIAGNOSIS — B351 Tinea unguium: Secondary | ICD-10-CM

## 2012-01-08 DIAGNOSIS — Z0000 Encounter for general adult medical examination without abnormal findings: Secondary | ICD-10-CM

## 2012-01-08 DIAGNOSIS — E785 Hyperlipidemia, unspecified: Secondary | ICD-10-CM

## 2012-01-08 LAB — POC HEMOCCULT BLD/STL (OFFICE/1-CARD/DIAGNOSTIC): Fecal Occult Blood, POC: NEGATIVE

## 2012-01-08 MED ORDER — TERBINAFINE HCL 250 MG PO TABS: 250.0000 mg | ORAL_TABLET | Freq: Every day | ORAL | Status: DC

## 2012-01-08 NOTE — Patient Instructions (Addendum)
F/u mid to end September or early October.  PSA due Sept 20 or after.  Medication sent in for fungal toenail infection.  Fasting CBC, chem 7 cmp, TSH as soon as possible  It is important that you exercise regularly at least 30 minutes 5 times a week. If you develop chest pain, have severe difficulty breathing, or feel very tired, stop exercising immediately and seek medical attention  A healthy diet is rich in fruit, vegetables and whole grains. Poultry fish, nuts and beans are a healthy choice for protein rather then red meat. A low sodium diet and drinking 64 ounces of water daily is generally recommended. Oils and sweet should be limited. Carbohydrates especially for those who are diabetic or overweight, should be limited to 30-45 gram per meal. It is important to eat on a regular schedule, at least 3 times daily. Snacks should be primarily fruits, vegetables or nuts.  Blood pressure is slightly elevated, please cut back on salty foods and canned foods    X ray of low back

## 2012-01-08 NOTE — Assessment & Plan Note (Signed)
Increased x 6 month, will order xray, I suspect osteoarthritis

## 2012-01-08 NOTE — Assessment & Plan Note (Signed)
Uncontrolled, low sodium diet, exercise and weight loss, no med change

## 2012-01-08 NOTE — Progress Notes (Signed)
  Subjective:    Patient ID: Bryce Martinez, male    DOB: 1951/01/12, 61 y.o.   MRN: [id number]  HPI The PT is here for annual exam  and re-evaluation of chronic medical conditions, medication management and review of any available recent lab and radiology data.  Preventive health is updated, specifically  Cancer screening and Immunization.   Questions or concerns regarding consultations or procedures which the PT has had in the interim are  addressed. The PT denies any adverse reactions to current medications since the last visit.  C/o increased low back pain for the past 3 to 6 months. Non radiating, no lower extremity weakness or numbness       Review of Systems See HPI Denies recent fever or chills. Denies uncontrolled  sinus pressure, nasal congestion, ear pain or sore throat.Does have mild seasonal allergies Denies chest congestion, productive cough or wheezing. Denies chest pains, palpitations and leg swelling Denies abdominal pain, nausea, vomiting,diarrhea or constipation.   Denies dysuria, frequency, hesitancy or incontinence. Denies headaches, seizures, numbness, or tingling. Denies depression, anxiety or insomnia. Denies skin break down or rash.        Objective:   Physical Exam Pleasant well nourished male, alert and oriented x 3, in no cardio-pulmonary distress. Afebrile. HEENT No facial trauma or asymetry. Sinuses non tender. EOMI, PERTL, External ears normal, tympanic membranes clear. Oropharynx moist, no exudate,fairdentition. Neck: supple, no adenopathy,JVD or thyromegaly.No bruits.  Chest: Clear to ascultation bilaterally.No crackles or wheezes. Non tender to palpation  Breast: No asymetry,no masses. No nipple discharge or inversion. No axillary or supraclavicular adenopathy  Cardiovascular system; Heart sounds normal,  S1 and  S2 ,no S3.  No murmur, or thrill. Apical beat not displaced Peripheral pulses normal.  Abdomen: Soft, non tender,  no organomegaly or masses. No bruits. Bowel sounds normal. No guarding, tenderness or rebound.  Rectal:  No mass. guaiac negative stool. Prostate smooth and firm   Musculoskeletal exam: Full ROM of spine, hips , shoulders and knees. No deformity ,swelling or crepitus noted. No muscle wasting or atrophy.   Neurologic: Cranial nerves 2 to 12 intact. Power, tone ,sensation and reflexes normal throughout. No disturbance in gait. No tremor.  Skin: Tinea pedis and onychomycosis Pigmentation normal throughout  Psych; Normal mood and affect. Judgement and concentration normal          Assessment & Plan:

## 2012-01-08 NOTE — Assessment & Plan Note (Signed)
Extensive onychomycosis, also tinea pedis, med prescribed

## 2012-01-09 ENCOUNTER — Other Ambulatory Visit: Payer: Self-pay | Admitting: Family Medicine

## 2012-01-10 LAB — CBC
HCT: 46.8 % (ref 39.0–52.0)
Hemoglobin: 15.4 g/dL (ref 13.0–17.0)
MCHC: 32.9 g/dL (ref 30.0–36.0)
MCV: 84.9 fL (ref 78.0–100.0)
RDW: 13.7 % (ref 11.5–15.5)

## 2012-01-10 LAB — COMPLETE METABOLIC PANEL WITH GFR
Albumin: 4.7 g/dL (ref 3.5–5.2)
Alkaline Phosphatase: 53 U/L (ref 39–117)
BUN: 17 mg/dL (ref 6–23)
Calcium: 10.1 mg/dL (ref 8.4–10.5)
Creat: 1.39 mg/dL — ABNORMAL HIGH (ref 0.50–1.35)
GFR, Est Non African American: 55 mL/min — ABNORMAL LOW
Glucose, Bld: 106 mg/dL — ABNORMAL HIGH (ref 70–99)
Potassium: 4.3 mEq/L (ref 3.5–5.3)

## 2012-01-10 LAB — TSH: TSH: 4.528 u[IU]/mL — ABNORMAL HIGH (ref 0.350–4.500)

## 2012-01-14 LAB — HEMOGLOBIN A1C: Mean Plasma Glucose: 117 mg/dL — ABNORMAL HIGH (ref <117)

## 2012-01-15 ENCOUNTER — Ambulatory Visit (HOSPITAL_COMMUNITY)
Admission: RE | Admit: 2012-01-15 | Discharge: 2012-01-15 | Disposition: A | Discharge status: Home / Self Care | Payer: Medicare HMO | Source: Ambulatory Visit | Attending: Family Medicine | Admitting: Family Medicine

## 2012-01-15 DIAGNOSIS — M5137 Other intervertebral disc degeneration, lumbosacral region: Secondary | ICD-10-CM | POA: Insufficient documentation

## 2012-01-15 DIAGNOSIS — M51379 Other intervertebral disc degeneration, lumbosacral region without mention of lumbar back pain or lower extremity pain: Secondary | ICD-10-CM | POA: Insufficient documentation

## 2012-01-15 DIAGNOSIS — M549 Dorsalgia, unspecified: Secondary | ICD-10-CM

## 2012-01-15 LAB — T3, FREE: T3, Free: 3.6 pg/mL (ref 2.3–4.2)

## 2012-02-14 ENCOUNTER — Other Ambulatory Visit: Payer: Self-pay | Admitting: Family Medicine

## 2012-07-21 ENCOUNTER — Other Ambulatory Visit: Payer: Self-pay | Admitting: Family Medicine

## 2012-07-22 ENCOUNTER — Encounter: Payer: Self-pay | Admitting: Family Medicine

## 2012-07-22 ENCOUNTER — Ambulatory Visit (INDEPENDENT_AMBULATORY_CARE_PROVIDER_SITE_OTHER): Payer: Medicare HMO | Admitting: Family Medicine

## 2012-07-22 VITALS — BP 130/82 | HR 66 | Resp 18 | Ht 72.0 in | Wt 190.1 lb

## 2012-07-22 DIAGNOSIS — M25519 Pain in unspecified shoulder: Secondary | ICD-10-CM

## 2012-07-22 DIAGNOSIS — M25511 Pain in right shoulder: Secondary | ICD-10-CM | POA: Insufficient documentation

## 2012-07-22 DIAGNOSIS — I10 Essential (primary) hypertension: Secondary | ICD-10-CM

## 2012-07-22 DIAGNOSIS — J309 Allergic rhinitis, unspecified: Secondary | ICD-10-CM

## 2012-07-22 DIAGNOSIS — Z2911 Encounter for prophylactic immunotherapy for respiratory syncytial virus (RSV): Secondary | ICD-10-CM

## 2012-07-22 DIAGNOSIS — R5383 Other fatigue: Secondary | ICD-10-CM

## 2012-07-22 DIAGNOSIS — E785 Hyperlipidemia, unspecified: Secondary | ICD-10-CM

## 2012-07-22 LAB — PSA: PSA: 0.8 ng/mL (ref <=4.00)

## 2012-07-22 MED ORDER — IBUPROFEN 800 MG PO TABS: 800.0000 mg | ORAL_TABLET | Freq: Three times a day (TID) | ORAL | Status: DC | PRN

## 2012-07-22 MED ORDER — PREDNISONE (PAK) 5 MG PO TABS: 5.0000 mg | ORAL_TABLET | ORAL | Status: DC

## 2012-07-22 NOTE — Patient Instructions (Addendum)
F/u with rectal  End March 2014    Your blood pressure is excellent. No change in medication   Zostavax today with info on this.  Please remember you need to cut back on sweets and carbs and also fried and fatty foods, blood sugar is higher than it should be , as is your cholesterol  HBA1C, tSH, CBC, festing chem 7 in March  Prednisone dose back and ibuprofen sent in for right shoulder pain, if no better next week you will need to see Dr Eulah Pont

## 2012-07-26 NOTE — Assessment & Plan Note (Signed)
Hyperlipidemia:Low fat diet discussed and encouraged.  pdated lab for next visit

## 2012-07-26 NOTE — Assessment & Plan Note (Signed)
Acute uncontrolled pain, with limitation in mobility. Aggressive anti inflammatories, if no relief needs ortho re eval

## 2012-07-26 NOTE — Progress Notes (Signed)
  Subjective:    Patient ID: Bryce Martinez, male    DOB: [birth year], 61 y.o.   MRN: [id number]  HPI The PT is here for follow up and re-evaluation of chronic medical conditions, medication management and review of any available recent lab and radiology data.  Preventive health is updated, specifically  Cancer screening and Immunization.   Questions or concerns regarding consultations or procedures which the PT has had in the interim are  addressed. The PT denies any adverse reactions to current medications since the last visit.  There are no new concerns.  There are no specific complaints c/o right shoulder pain with reduced mobility for the past 2 weeeks     Review of Systems See HPI Denies recent fever or chills. Denies sinus pressure, nasal congestion, ear pain or sore throat. Denies chest congestion, productive cough or wheezing. Denies chest pains, palpitations and leg swelling Denies abdominal pain, nausea, vomiting,diarrhea or constipation.   Denies dysuria, frequency, hesitancy or incontinence. . Denies headaches, seizures, numbness, or tingling. Denies depression, anxiety or insomnia. Denies skin break down or rash.        Objective:   Physical Exam Patient alert and oriented and in no cardiopulmonary distress.  HEENT: No facial asymmetry, EOMI, no sinus tenderness,  oropharynx pink and moist.  Neck supple no adenopathy.  Chest: Clear to auscultation bilaterally.  CVS: S1, S2 no murmurs, no S3.  ABD: Soft non tender. Bowel sounds normal.  Ext: No edema  MS: Adequate ROM spine, hips and knees.Decreased ROM right shoulder  Skin: Intact, no ulcerations or rash noted.  Psych: Good eye contact, normal affect. Memory intact not anxious or depressed appearing.  CNS: CN 2-12 intact, power, tone and sensation normal throughout.        Assessment & Plan:

## 2012-07-26 NOTE — Assessment & Plan Note (Signed)
Currently minimally symptomatic, no meds needed at this time

## 2012-07-26 NOTE — Assessment & Plan Note (Signed)
Controlled, no change in medication DASH diet and commitment to daily physical activity for a minimum of 30 minutes discussed and encouraged, as a part of hypertension management. The importance of attaining a healthy weight is also discussed.  

## 2012-08-29 ENCOUNTER — Other Ambulatory Visit: Payer: Self-pay | Admitting: Family Medicine

## 2012-11-25 ENCOUNTER — Encounter: Payer: Self-pay | Admitting: Family Medicine

## 2012-11-25 ENCOUNTER — Ambulatory Visit (INDEPENDENT_AMBULATORY_CARE_PROVIDER_SITE_OTHER): Payer: Medicare HMO | Admitting: Family Medicine

## 2012-11-25 VITALS — BP 120/84 | HR 88 | Resp 18 | Ht 72.0 in | Wt 189.0 lb

## 2012-11-25 DIAGNOSIS — H9209 Otalgia, unspecified ear: Secondary | ICD-10-CM

## 2012-11-25 DIAGNOSIS — E785 Hyperlipidemia, unspecified: Secondary | ICD-10-CM

## 2012-11-25 DIAGNOSIS — R7301 Impaired fasting glucose: Secondary | ICD-10-CM

## 2012-11-25 DIAGNOSIS — I10 Essential (primary) hypertension: Secondary | ICD-10-CM

## 2012-11-25 DIAGNOSIS — H9201 Otalgia, right ear: Secondary | ICD-10-CM | POA: Insufficient documentation

## 2012-11-25 NOTE — Patient Instructions (Addendum)
F/u in 4.5 month, please call if you need me before  You are referred to ENT for further evaluation of the ear.Stop at referral staff at checkout for appt pls  Take sudafed one twice daily for ear fullness until seen  Please get fasting lipid, cmp and HBA1C as soon as possible in the next week, these are overdue  Pl;ease get the flu vaccine at the pharmacy

## 2012-11-25 NOTE — Progress Notes (Signed)
  Subjective:    Patient ID: Bryce Martinez, male    DOB: 1951-04-02, 62 y.o.   MRN: [id number]  HPI 2 week h/o right ear pain with muffling, no preceding sinus pressure, vertigo or tinnitus. No h/o direct ear trauma. Prior to this he has been well with no complaints. Labs and immunization need to be updated   Review of Systems See HPI Denies recent fever or chills. Denies sinus pressure, nasal congestion,  or sore throat. Denies chest congestion, productive cough or wheezing. Denies chest pains, palpitations and leg swelling Denies abdominal pain, nausea, vomiting,diarrhea or constipation.   Denies dysuria, frequency, hesitancy or incontinence. Denies joint pain, swelling and limitation in mobility. Denies headaches, seizures, numbness, or tingling. Denies depression, anxiety or insomnia. Denies skin break down or rash.        Objective:   Physical Exam  Patient alert and oriented and in no cardiopulmonary distress.  HEENT: No facial asymmetry, EOMI, no sinus tenderness,  oropharynx pink and moist.  Neck supple no adenopathy.Both TM are clear  Chest: Clear to auscultation bilaterally.  CVS: S1, S2 no murmurs, no S3.  ABD: Soft non tender. Bowel sounds normal.  Ext: No edema  MS: Adequate ROM spine, shoulders, hips and knees.  Skin: Intact, no ulcerations or rash noted.  Psych: Good eye contact, normal affect. Memory intact not anxious or depressed appearing.  CNS: CN 2-12 intact, power, tone and sensation normal throughout.       Assessment & Plan:

## 2012-11-29 NOTE — Assessment & Plan Note (Signed)
Acute right ear pain wit hearing loss subjectively, urgent ENT eval

## 2012-11-29 NOTE — Assessment & Plan Note (Signed)
Controlled, no change in medication DASH diet and commitment to daily physical activity for a minimum of 30 minutes discussed and encouraged, as a part of hypertension management. The importance of attaining a healthy weight is also discussed.  

## 2012-11-29 NOTE — Assessment & Plan Note (Signed)
Hyperlipidemia:Low fat diet discussed and encouraged.  \updtaed lab needed

## 2012-12-09 LAB — COMPREHENSIVE METABOLIC PANEL
ALT: 22 U/L (ref 0–53)
Albumin: 4.2 g/dL (ref 3.5–5.2)
CO2: 29 mEq/L (ref 19–32)
Calcium: 9.6 mg/dL (ref 8.4–10.5)
Chloride: 101 mEq/L (ref 96–112)
Creat: 1.09 mg/dL (ref 0.50–1.35)
Potassium: 4 mEq/L (ref 3.5–5.3)

## 2012-12-09 LAB — LIPID PANEL
Cholesterol: 207 mg/dL — ABNORMAL HIGH (ref 0–200)
LDL Cholesterol: 129 mg/dL — ABNORMAL HIGH (ref 0–99)
Triglycerides: 103 mg/dL (ref <150)

## 2012-12-09 LAB — HEMOGLOBIN A1C: Hgb A1c MFr Bld: 6.2 % — ABNORMAL HIGH (ref <5.7)

## 2013-01-28 ENCOUNTER — Encounter: Payer: Medicare HMO | Admitting: Family Medicine

## 2013-02-23 NOTE — Addendum Note (Signed)
Addended by: Abner Greenspan on: 02/23/2013 02:32 PM   Modules accepted: Orders

## 2013-03-15 ENCOUNTER — Other Ambulatory Visit: Payer: Self-pay | Admitting: Family Medicine

## 2013-04-10 LAB — HEMOGLOBIN A1C
Hgb A1c MFr Bld: 5.6 % (ref <5.7)
Mean Plasma Glucose: 114 mg/dL (ref <117)

## 2013-04-16 ENCOUNTER — Ambulatory Visit (INDEPENDENT_AMBULATORY_CARE_PROVIDER_SITE_OTHER): Payer: Medicare HMO | Admitting: Family Medicine

## 2013-04-16 ENCOUNTER — Encounter: Payer: Self-pay | Admitting: Family Medicine

## 2013-04-16 VITALS — BP 140/80 | HR 60 | Resp 18 | Wt 186.0 lb

## 2013-04-16 DIAGNOSIS — R5383 Other fatigue: Secondary | ICD-10-CM

## 2013-04-16 DIAGNOSIS — R5381 Other malaise: Secondary | ICD-10-CM

## 2013-04-16 DIAGNOSIS — I10 Essential (primary) hypertension: Secondary | ICD-10-CM

## 2013-04-16 DIAGNOSIS — Z0000 Encounter for general adult medical examination without abnormal findings: Secondary | ICD-10-CM

## 2013-04-16 DIAGNOSIS — E785 Hyperlipidemia, unspecified: Secondary | ICD-10-CM

## 2013-04-16 DIAGNOSIS — Z1211 Encounter for screening for malignant neoplasm of colon: Secondary | ICD-10-CM

## 2013-04-16 DIAGNOSIS — R7301 Impaired fasting glucose: Secondary | ICD-10-CM

## 2013-04-16 DIAGNOSIS — Z125 Encounter for screening for malignant neoplasm of prostate: Secondary | ICD-10-CM

## 2013-04-16 NOTE — Progress Notes (Signed)
  Subjective:    Patient ID: Bryce Martinez, male    DOB: [birth year], 62 y.o.   MRN: [id number]  HPI The PT is here for annual exam  and re-evaluation of chronic medical conditions, medication management and review of any available recent lab and radiology data.  Preventive health is updated, specifically  Cancer screening and Immunization.   . The PT denies any adverse reactions to current medications since the last visit.  Currently working swing shifts, and will do so for the next 2 years, coping well with this. Has not been exercising as he has for the past 30 years  In the past year , misses this and will commit again There are no specific complaints , still experiences intermittent bloating and excessive belching from gall stones, is putting off surgery till nearer retirement      Review of Systems See HPI Denies recent fever or chills. Denies sinus pressure, nasal congestion, ear pain or sore throat. Denies chest congestion, productive cough or wheezing. Denies chest pains, palpitations and leg swelling Denies  nausea, vomiting,diarrhea or constipation.  Denies change in BM Denies dysuria, frequency, hesitancy or incontinence. Denies joint pain, swelling and limitation in mobility. Denies headaches, seizures, numbness, or tingling. Denies depression, anxiety or insomnia. Denies skin break down or rash.        Objective:   Physical Exam  Pleasant well nourished male, alert and oriented x 3, in no cardio-pulmonary distress. Afebrile. HEENT No facial trauma or asymetry. Sinuses non tender. EOMI, PERTL, fundoscopic exam is negative for hemorhages or exudates. External ears normal, tympanic membranes clear. Oropharynx moist, no exudate, good dentition. Neck: supple, no adenopathy,JVD or thyromegaly.No bruits.  Chest: Clear to ascultation bilaterally.No crackles or wheezes. Non tender to palpation  Breast: No asymetry,no masses. No nipple discharge or  inversion. No axillary or supraclavicular adenopathy  Cardiovascular system; Heart sounds normal,  S1 and  S2 ,no S3.  No murmur, or thrill. Apical beat not displaced Peripheral pulses normal.  Abdomen: Soft, non tender, no organomegaly or masses. No bruits. Bowel sounds normal. No guarding, tenderness or rebound.  Rectal:  No mass. guaiac negative stool. Prostate smooth and firm   Musculoskeletal exam: Full ROM of spine, hips , shoulders and knees. No deformity ,swelling or crepitus noted. No muscle wasting or atrophy.   Neurologic: Cranial nerves 2 to 12 intact. Power, tone ,sensation and reflexes normal throughout. No disturbance in gait. No tremor.  Skin: Intact, no ulceration, erythema , scaling or rash noted. Pigmentation normal throughout  Psych; Normal mood and affect. Judgement and concentration normal        Assessment & Plan:

## 2013-04-16 NOTE — Patient Instructions (Addendum)
F/ mid to  end November, call if you need me before  Fasting lipid, chem 7, hBA1C, , CBC, PSA in November ,before visit.  Congrats on normalized blood sugars, keep it up!Kepp up the vegetable juices  Blood pressure slightly high, cut back on salt  Rectal exam today is normal   Please get back to daily exercise, this is excellent for your health. Start aspirin 81mg  one daily for reduction of heart disease risk  Take 1 multivitamin daily

## 2013-04-17 DIAGNOSIS — Z0000 Encounter for general adult medical examination without abnormal findings: Secondary | ICD-10-CM | POA: Insufficient documentation

## 2013-04-17 NOTE — Assessment & Plan Note (Signed)
Physical exam completed as documented. Pt to commit to regular exercise once more He is to start daily aspirin and also multivitamin

## 2013-04-17 NOTE — Assessment & Plan Note (Signed)
Controlled, no change in medication DASH diet and commitment to daily physical activity for a minimum of 30 minutes discussed and encouraged, as a part of hypertension management. The importance of attaining a healthy weight is also discussed.  

## 2013-04-17 NOTE — Assessment & Plan Note (Signed)
Uncontrolled Hyperlipidemia:Low fat diet discussed and encouraged.   

## 2013-04-19 NOTE — Addendum Note (Signed)
Addended by: Abner Greenspan on: 04/19/2013 01:39 PM   Modules accepted: Orders

## 2013-09-19 ENCOUNTER — Other Ambulatory Visit: Payer: Self-pay | Admitting: Family Medicine

## 2013-09-24 ENCOUNTER — Other Ambulatory Visit: Payer: Self-pay | Admitting: Family Medicine

## 2013-09-24 LAB — TSH: TSH: 2.322 u[IU]/mL (ref 0.350–4.500)

## 2013-09-27 ENCOUNTER — Encounter (INDEPENDENT_AMBULATORY_CARE_PROVIDER_SITE_OTHER): Payer: Self-pay

## 2013-09-27 ENCOUNTER — Encounter: Payer: Self-pay | Admitting: Family Medicine

## 2013-09-27 ENCOUNTER — Ambulatory Visit (INDEPENDENT_AMBULATORY_CARE_PROVIDER_SITE_OTHER): Payer: 59 | Admitting: Family Medicine

## 2013-09-27 VITALS — BP 134/82 | HR 84 | Resp 18 | Ht 72.0 in | Wt 183.1 lb

## 2013-09-27 DIAGNOSIS — I10 Essential (primary) hypertension: Secondary | ICD-10-CM

## 2013-09-27 DIAGNOSIS — K819 Cholecystitis, unspecified: Secondary | ICD-10-CM

## 2013-09-27 DIAGNOSIS — R7309 Other abnormal glucose: Secondary | ICD-10-CM

## 2013-09-27 DIAGNOSIS — N529 Male erectile dysfunction, unspecified: Secondary | ICD-10-CM

## 2013-09-27 DIAGNOSIS — E785 Hyperlipidemia, unspecified: Secondary | ICD-10-CM

## 2013-09-27 DIAGNOSIS — R7302 Impaired glucose tolerance (oral): Secondary | ICD-10-CM

## 2013-09-27 DIAGNOSIS — Z1321 Encounter for screening for nutritional disorder: Secondary | ICD-10-CM

## 2013-09-27 DIAGNOSIS — Z130 Encounter for screening for diseases of the blood and blood-forming organs and certain disorders involving the immune mechanism: Secondary | ICD-10-CM

## 2013-09-27 DIAGNOSIS — J309 Allergic rhinitis, unspecified: Secondary | ICD-10-CM

## 2013-09-27 LAB — BASIC METABOLIC PANEL
BUN: 12 mg/dL (ref 6–23)
CO2: 24 mEq/L (ref 19–32)
Creat: 1.11 mg/dL (ref 0.50–1.35)
Glucose, Bld: 89 mg/dL (ref 70–99)
Potassium: 4.1 mEq/L (ref 3.5–5.3)
Sodium: 140 mEq/L (ref 135–145)

## 2013-09-27 LAB — PSA: PSA: 0.7 ng/mL (ref <=4.00)

## 2013-09-27 MED ORDER — SILDENAFIL CITRATE 100 MG PO TABS: ORAL_TABLET | ORAL | Status: DC

## 2013-09-27 NOTE — Assessment & Plan Note (Signed)
No current flare and patient on no regular medication for this

## 2013-09-27 NOTE — Assessment & Plan Note (Signed)
asymptomatic

## 2013-09-27 NOTE — Assessment & Plan Note (Signed)
Hyperlipidemia:Low fat diet discussed and encouraged.  Updated lab in 5 month

## 2013-09-27 NOTE — Assessment & Plan Note (Signed)
Controlled, no change in medication  

## 2013-09-27 NOTE — Progress Notes (Signed)
  Subjective:    Patient ID: Bryce Martinez, male    DOB: [birth year], 62 y.o.   MRN: [id number]  HPI The PT is here for follow up and re-evaluation of chronic medical conditions, medication management and review of any available recent lab and radiology data.  Preventive health is updated, specifically  Cancer screening and Immunization.   Questions or concerns regarding consultations or procedures which the PT has had in the interim are  Addressed.Did see ENT earlier this year for right ear discomfort, no pathology identified, despite extensive testing. States still gets occasional "fullness " in the ear, and some time in the past 3 months he felt a "pop" along the right side of the neck anteriorly, over a muscle. Denies upper extremity weakness or numbness The PT denies any adverse reactions to current medications since the last visit.  Requests samples of ED meds, no interest in long term use, coupon provided Intends to join the gym, and is comited to increased intake of fruit and vegetable which he enjoys blending    Review of Systems See HPI Denies recent fever or chills. Denies sinus pressure, nasal congestion, ear pain or sore throat. Denies chest congestion, productive cough or wheezing. Denies chest pains, palpitations and leg swelling Denies abdominal pain, nausea, vomiting,diarrhea or constipation.   Denies dysuria, frequency, hesitancy or incontinence.  Denies headaches, seizures, numbness, or tingling. Denies depression, anxiety or insomnia. Denies skin break down or rash.        Objective:   Physical Exam  Patient alert and oriented and in no cardiopulmonary distress.  HEENT: No facial asymmetry, EOMI, no sinus tenderness,  oropharynx pink and moist.  Neck supple no adenopathy.  Chest: Clear to auscultation bilaterally.  CVS: S1, S2 no murmurs, no S3.  ABD: Soft non tender. Bowel sounds normal.  Ext: No edema  MS: Adequate ROM spine, shoulders, hips and  knees.  Skin: Intact, no ulcerations or rash noted.  Psych: Good eye contact, normal affect. Memory intact not anxious or depressed appearing.  CNS: CN 2-12 intact, power, tone and sensation normal throughout.       Assessment & Plan:

## 2013-09-27 NOTE — Assessment & Plan Note (Signed)
miniumal symptoms, but requests viagra  Since "free available for short term. No desire for long term med at this time. Scrip and coupon given for 3 tabs, 100mg 

## 2013-09-27 NOTE — Patient Instructions (Addendum)
F/u in 5.5 month, call if you need me before   Fasting lipid, chem 7, HBA1C, vit D and CBC in 5.5 month, before visit    It is important that you exercise regularly at least 30 minutes 5 times a week. If you develop chest pain, have severe difficulty breathing, or feel very tired, stop exercising immediately and seek medical attention    Continue current meds

## 2013-09-28 NOTE — Addendum Note (Signed)
Addended by: Kandis Fantasia B on: 09/28/2013 04:50 PM   Modules accepted: Orders

## 2013-09-29 ENCOUNTER — Ambulatory Visit: Payer: Medicare HMO | Admitting: Family Medicine

## 2013-11-22 ENCOUNTER — Other Ambulatory Visit: Payer: Self-pay | Admitting: Family Medicine

## 2014-03-10 ENCOUNTER — Ambulatory Visit: Payer: 59 | Admitting: Family Medicine

## 2014-04-02 ENCOUNTER — Other Ambulatory Visit: Payer: Self-pay | Admitting: Family Medicine

## 2014-04-02 LAB — BASIC METABOLIC PANEL
BUN: 18 mg/dL (ref 6–23)
CHLORIDE: 101 meq/L (ref 96–112)
CO2: 31 mEq/L (ref 19–32)
CREATININE: 1.21 mg/dL (ref 0.50–1.35)
Calcium: 9.2 mg/dL (ref 8.4–10.5)
Glucose, Bld: 99 mg/dL (ref 70–99)
Potassium: 3.9 mEq/L (ref 3.5–5.3)
Sodium: 139 mEq/L (ref 135–145)

## 2014-04-02 LAB — CBC
HCT: 40.1 % (ref 39.0–52.0)
HEMOGLOBIN: 13.6 g/dL (ref 13.0–17.0)
MCH: 27.4 pg (ref 26.0–34.0)
MCHC: 33.9 g/dL (ref 30.0–36.0)
MCV: 80.7 fL (ref 78.0–100.0)
PLATELETS: 252 10*3/uL (ref 150–400)
RBC: 4.97 MIL/uL (ref 4.22–5.81)
RDW: 14.4 % (ref 11.5–15.5)
WBC: 3.9 10*3/uL — ABNORMAL LOW (ref 4.0–10.5)

## 2014-04-02 LAB — LIPID PANEL
Cholesterol: 199 mg/dL (ref 0–200)
HDL: 52 mg/dL (ref >39)
LDL CALC: 132 mg/dL — AB (ref 0–99)
TRIGLYCERIDES: 77 mg/dL (ref <150)
Total CHOL/HDL Ratio: 3.8 Ratio
VLDL: 15 mg/dL (ref 0–40)

## 2014-04-03 LAB — HEMOGLOBIN A1C
Hgb A1c MFr Bld: 5.9 % — ABNORMAL HIGH (ref <5.7)
MEAN PLASMA GLUCOSE: 123 mg/dL — AB (ref <117)

## 2014-04-04 ENCOUNTER — Encounter (INDEPENDENT_AMBULATORY_CARE_PROVIDER_SITE_OTHER): Payer: Self-pay

## 2014-04-04 ENCOUNTER — Encounter: Payer: Self-pay | Admitting: Family Medicine

## 2014-04-04 ENCOUNTER — Ambulatory Visit (INDEPENDENT_AMBULATORY_CARE_PROVIDER_SITE_OTHER): Payer: 59 | Admitting: Family Medicine

## 2014-04-04 VITALS — BP 132/76 | HR 64 | Resp 18 | Ht 72.0 in | Wt 185.1 lb

## 2014-04-04 DIAGNOSIS — R7309 Other abnormal glucose: Secondary | ICD-10-CM

## 2014-04-04 DIAGNOSIS — E785 Hyperlipidemia, unspecified: Secondary | ICD-10-CM

## 2014-04-04 DIAGNOSIS — N529 Male erectile dysfunction, unspecified: Secondary | ICD-10-CM

## 2014-04-04 DIAGNOSIS — I10 Essential (primary) hypertension: Secondary | ICD-10-CM

## 2014-04-04 DIAGNOSIS — K819 Cholecystitis, unspecified: Secondary | ICD-10-CM

## 2014-04-04 DIAGNOSIS — R1013 Epigastric pain: Secondary | ICD-10-CM

## 2014-04-04 DIAGNOSIS — R7302 Impaired glucose tolerance (oral): Secondary | ICD-10-CM

## 2014-04-04 LAB — VITAMIN D 25 HYDROXY (VIT D DEFICIENCY, FRACTURES): Vit D, 25-Hydroxy: 26 ng/mL — ABNORMAL LOW (ref 30–89)

## 2014-04-04 MED ORDER — TADALAFIL 20 MG PO TABS: 20.0000 mg | ORAL_TABLET | Freq: Every day | ORAL | Status: DC | PRN

## 2014-04-04 NOTE — Progress Notes (Signed)
   Subjective:    Patient ID: Bryce Martinez, male    DOB: [birth year], 63 y.o.   MRN: [id number]  HPI 5 month h/o intermittent severe umbilical pain radiaiting to back which is new, also notes "dull ache" in RUQ, he has poorly functioning gallbladder but no h/o gallstones, no fevr or chills Good response to cialis re Ed requests hard script,states gets "on line" from reliable source The PT is here for follow up and re-evaluation of chronic medical conditions, medication management and review of any available recent lab and radiology data.  Preventive health is updated, specifically  Cancer screening and Immunization.   . The PT denies any adverse reactions to current medications since the last visit.          Review of Systems See HPI Denies recent fever or chills. Denies sinus pressure, nasal congestion, ear pain or sore throat. Denies chest congestion, productive cough or wheezing. Denies chest pains, palpitations and leg swelling  c/o generalized Denies joint pain, and limitation in mobility at times, states due to ause of joints when he lifted excessive weights Denies headaches, seizures, numbness, or tingling. Denies depression, anxiety or insomnia. Denies skin break down or rash.        Objective:   Physical Exam BP 132/76  Pulse 64  Resp 18  Ht 6' (1.829 m)  Wt 185 lb 1.3 oz (83.952 kg)  BMI 25.10 kg/m2  SpO2 99% Patient alert and oriented and in no cardiopulmonary distress.  HEENT: No facial asymmetry, EOMI, no sinus tenderness,  oropharynx pink and moist.  Neck supple no adenopathy.  Chest: Clear to auscultation bilaterally.  CVS: S1, S2 no murmurs, no S3.  ABD: Soft non guarding or rebound tenderness, no palpable organomegaly or mass  Ext: No edema  MS: Adequate ROM spine, shoulders, hips and knees.  Skin: Intact, no ulcerations or rash noted.  Psych: Good eye contact, normal affect. Memory intact not anxious or depressed appearing.  CNS: CN  2-12 intact, power, tone and sensation normal throughout.        Assessment & Plan:  DYSLIPIDEMIA LDL still too high Hyperlipidemia:Low fat diet discussed and encouraged.  Npo meds indicated currently  ED (erectile dysfunction) Good response to cialis prescription provided, pt states he has access to meds through faxing in for 'on line fill"  CHOLECYSTITIS, ACALCULOUS Increased and new type of abdominal pain in past 5 to 6 mohts, complete abdominal US to further eval also H pylori to be added.may need GI eval and upper endo  HYPERTENSION Controlled, no change in medication DASH diet and commitment to daily physical activity for a minimum of 30 minutes discussed and encouraged, as a part of hypertension management. The importance of attaining a healthy weight is also discussed.   IGT (impaired glucose tolerance) Deteriorated. Patient educated about the importance of limiting  Carbohydrate intake , the need to commit to daily physical activity for a minimum of 30 minutes , and to commit weight loss. The fact that changes in all these areas will reduce or eliminate all together the development of diabetes is stressed.

## 2014-04-04 NOTE — Patient Instructions (Addendum)
F/U in 6 month, call if you need me before  Blood pressure is good  Cut back on sugar and fatty foods, sugar has increased and bad chlesterol still high  You  Will be referred fro complete US of abdomen due to new pain and also we will contact you re test for bacterial infection in blood.  You may need GI if we cannot find out why you have new abdominal pain

## 2014-04-05 DIAGNOSIS — R7302 Impaired glucose tolerance (oral): Secondary | ICD-10-CM | POA: Insufficient documentation

## 2014-04-05 NOTE — Assessment & Plan Note (Signed)
Increased and new type of abdominal pain in past 5 to 6 mohts, complete abdominal US to further eval also H pylori to be added.may need GI eval and upper endo

## 2014-04-05 NOTE — Assessment & Plan Note (Signed)
LDL still too high Hyperlipidemia:Low fat diet discussed and encouraged.  Npo meds indicated currently

## 2014-04-05 NOTE — Assessment & Plan Note (Signed)
Good response to cialis prescription provided, pt states he has access to meds through faxing in for 'on line fill"

## 2014-04-05 NOTE — Assessment & Plan Note (Addendum)
Deteriorated Patient educated about the importance of limiting  Carbohydrate intake , the need to commit to daily physical activity for a minimum of 30 minutes , and to commit weight loss. The fact that changes in all these areas will reduce or eliminate all together the development of diabetes is stressed.    

## 2014-04-05 NOTE — Addendum Note (Signed)
Addended by: Denman George B on: 04/05/2014 12:17 PM   Modules accepted: Orders

## 2014-04-05 NOTE — Assessment & Plan Note (Signed)
Controlled, no change in medication DASH diet and commitment to daily physical activity for a minimum of 30 minutes discussed and encouraged, as a part of hypertension management. The importance of attaining a healthy weight is also discussed.  

## 2014-04-06 LAB — H. PYLORI ANTIBODY, IGG: H PYLORI IGG: 0.5 {ISR}

## 2014-04-07 ENCOUNTER — Ambulatory Visit (HOSPITAL_COMMUNITY)
Admission: RE | Admit: 2014-04-07 | Discharge: 2014-04-07 | Disposition: A | Discharge status: Home / Self Care | Payer: 59 | Source: Ambulatory Visit | Attending: Family Medicine | Admitting: Family Medicine

## 2014-04-07 ENCOUNTER — Other Ambulatory Visit: Payer: Self-pay | Admitting: Family Medicine

## 2014-04-07 DIAGNOSIS — K819 Cholecystitis, unspecified: Secondary | ICD-10-CM

## 2014-04-07 DIAGNOSIS — R1013 Epigastric pain: Secondary | ICD-10-CM

## 2014-04-07 DIAGNOSIS — K8010 Calculus of gallbladder with chronic cholecystitis without obstruction: Secondary | ICD-10-CM

## 2014-04-07 DIAGNOSIS — K8020 Calculus of gallbladder without cholecystitis without obstruction: Secondary | ICD-10-CM

## 2014-04-13 NOTE — H&P (Signed)
  NTS SOAP Note  Vital Signs:  Vitals as of: 0/05/6807: Systolic 811: Diastolic 87: Heart Rate 65: Temp 21F: Height 73ft 1in: Weight 186Lbs 0 Ounces: Pain Level 1: BMI 24.54  BMI :   Subjective: This 63 Years 62 Months old Male presents for of Abdominal pain.  Has been having intermittent right upper quadrant abdominal pain with radiation to the right flank, nausea, and some food intolerance.  No fever, chills, jaundice.  U/S of gallbladder shows thickened gallbladder wall.  No stones seen.  Review of Symptoms:  Constitutional:unremarkable   Head:unremarkable    Eyes:unremarkable   Nose/Mouth/Throat:unremarkable Cardiovascular:  unremarkable   Respiratory:unremarkable   Gastrointestin    abdominal pain,nausea,heartburn Genitourinary:unremarkable       back pain Skin:unremarkable Hematolgic/Lymphatic:unremarkable     Allergic/Immunologic:unremarkable     Past Medical History:    Reviewed  Past Medical History  Surgical History: appendectomy Medical Problems: HTN Allergies: nkda Medications: a BP pill   Social History:Reviewed  Social History  Preferred Language: English Race:  Black or African American Ethnicity: Not Hispanic / Latino Age: 63 Years 62 Months Marital Status:  M Alcohol: no   Smoking Status: Never smoker reviewed on 04/12/2014 Functional Status reviewed on 04/12/2014 ------------------------------------------------ Bathing: Normal Cooking: Normal Dressing: Normal Driving: Normal Eating: Normal Managing Meds: Normal Oral Care: Normal Shopping: Normal Toileting: Normal Transferring: Normal Walking: Normal Cognitive Status reviewed on 04/12/2014 ------------------------------------------------ Attention: Normal Decision Making: Normal Language: Normal Memory: Normal Motor: Normal Perception: Normal Problem Solving: Normal Visual and Spatial: Normal   Family History:  Reviewed  Family Health  History Mother, Living; Healthy; healthy Father, Deceased; Colon cancer;     Objective Information: General:  Well appearing, well nourished in no distress.   no scleral icterus Heart:  RRR, no murmur Lungs:    CTA bilaterally, no wheezes, rhonchi, rales.  Breathing unlabored. Abdomen:Soft, NT/ND, no HSM, no masses.  Some tenderness in right upper quadrant to deep palpation.  Assessment:Chronic cholecystitis  Diagnoses: 575.11 Chronic cholecystitis (Chronic cholecystitis)  Procedures: 03159 - OFFICE OUTPATIENT NEW 30 MINUTES    Plan:  Scheduled for laparoscopic cholecystectomy on 04/25/14.     Patient Education:Alternative treatments to surgery were discussed with patient (and family).  Risks and benefits  of procedure including bleeding, infection, hepatobiliary injury, and the possibility of an open procedure were fully explained to the patient (and family) who gave informed consent. Patient/family questions were addressed.  Follow-up:Pending Surgery

## 2014-04-18 ENCOUNTER — Encounter (HOSPITAL_COMMUNITY): Payer: Self-pay

## 2014-04-19 ENCOUNTER — Encounter (HOSPITAL_COMMUNITY): Payer: Self-pay

## 2014-04-19 ENCOUNTER — Other Ambulatory Visit: Payer: Self-pay

## 2014-04-19 ENCOUNTER — Encounter (HOSPITAL_COMMUNITY)
Admission: RE | Admit: 2014-04-19 | Discharge: 2014-04-19 | Disposition: A | Discharge status: Home / Self Care | Payer: 59 | Source: Ambulatory Visit | Attending: General Surgery | Admitting: General Surgery

## 2014-04-19 DIAGNOSIS — Z01812 Encounter for preprocedural laboratory examination: Secondary | ICD-10-CM | POA: Insufficient documentation

## 2014-04-19 DIAGNOSIS — Z01810 Encounter for preprocedural cardiovascular examination: Secondary | ICD-10-CM | POA: Insufficient documentation

## 2014-04-19 LAB — CBC WITH DIFFERENTIAL/PLATELET
Basophils Absolute: 0 10*3/uL (ref 0.0–0.1)
Basophils Relative: 1 % (ref 0–1)
Eosinophils Absolute: 0.1 10*3/uL (ref 0.0–0.7)
Eosinophils Relative: 5 % (ref 0–5)
HCT: 41 % (ref 39.0–52.0)
HEMOGLOBIN: 14 g/dL (ref 13.0–17.0)
LYMPHS ABS: 1.6 10*3/uL (ref 0.7–4.0)
LYMPHS PCT: 56 % — AB (ref 12–46)
MCH: 28.2 pg (ref 26.0–34.0)
MCHC: 34.1 g/dL (ref 30.0–36.0)
MCV: 82.7 fL (ref 78.0–100.0)
Monocytes Absolute: 0.2 10*3/uL (ref 0.1–1.0)
Monocytes Relative: 8 % (ref 3–12)
NEUTROS PCT: 31 % — AB (ref 43–77)
Neutro Abs: 0.9 10*3/uL — ABNORMAL LOW (ref 1.7–7.7)
Platelets: 219 10*3/uL (ref 150–400)
RBC: 4.96 MIL/uL (ref 4.22–5.81)
RDW: 13.2 % (ref 11.5–15.5)
WBC: 2.9 10*3/uL — AB (ref 4.0–10.5)

## 2014-04-19 LAB — BASIC METABOLIC PANEL
BUN: 20 mg/dL (ref 6–23)
CALCIUM: 9.2 mg/dL (ref 8.4–10.5)
CO2: 31 mEq/L (ref 19–32)
Chloride: 100 mEq/L (ref 96–112)
Creatinine, Ser: 1.34 mg/dL (ref 0.50–1.35)
GFR calc Af Amer: 64 mL/min — ABNORMAL LOW (ref >90)
GFR, EST NON AFRICAN AMERICAN: 56 mL/min — AB (ref >90)
GLUCOSE: 95 mg/dL (ref 70–99)
POTASSIUM: 3.9 meq/L (ref 3.7–5.3)
SODIUM: 140 meq/L (ref 137–147)

## 2014-04-19 LAB — HEPATIC FUNCTION PANEL
ALBUMIN: 3.7 g/dL (ref 3.5–5.2)
ALK PHOS: 45 U/L (ref 39–117)
ALT: 34 U/L (ref 0–53)
AST: 27 U/L (ref 0–37)
Bilirubin, Direct: <0.2 mg/dL (ref 0.0–0.3)
Total Bilirubin: 1 mg/dL (ref 0.3–1.2)
Total Protein: 6.9 g/dL (ref 6.0–8.3)

## 2014-04-19 NOTE — Pre-Procedure Instructions (Signed)
Patient given information to sign up for my chart at home. 

## 2014-04-19 NOTE — Patient Instructions (Signed)
Bryce Martinez  04/19/2014   Your procedure is scheduled on:  04/25/2014  Report to [facility] at  65  AM.  Call this number if you have problems the morning of surgery: [phone]   Remember:   Do not eat food or drink liquids after midnight.   Take these medicines the morning of surgery with A SIP OF WATER: maxzide   Do not wear jewelry, make-up or nail polish.  Do not wear lotions, powders, or perfumes.   Do not shave 48 hours prior to surgery. Men may shave face and neck.  Do not bring valuables to the hospital.  [facility] is not responsible  for any belongings or valuables.               Contacts, dentures or bridgework may not be worn into surgery.  Leave suitcase in the car. After surgery it may be brought to your room.  For patients admitted to the hospital, discharge time is determined by your  treatment team.               Patients discharged the day of surgery will not be allowed to drive home.  Name and phone number of your driver: family  Special Instructions: Shower using CHG 2 nights before surgery and the night before surgery.  If you shower the day of surgery use CHG.  Use special wash - you have one bottle of CHG for all showers.  You should use approximately 1/3 of the bottle for each shower.   Please read over the following fact sheets that you were given: Pain Booklet, Coughing and Deep Breathing, Surgical Site Infection Prevention, Anesthesia Post-op Instructions and Care and Recovery After Surgery Laparoscopic Cholecystectomy Laparoscopic cholecystectomy is surgery to remove the gallbladder. The gallbladder is located in the upper right part of the abdomen, behind the liver. It is a storage sac for bile produced in the liver. Bile aids in the digestion and absorption of fats. Cholecystectomy is often done for inflammation of the gallbladder (cholecystitis). This condition is usually caused by a buildup of gallstones (cholelithiasis) in your  gallbladder. Gallstones can block the flow of bile, resulting in inflammation and pain. In severe cases, emergency surgery may be required. When emergency surgery is not required, you will have time to prepare for the procedure. Laparoscopic surgery is an alternative to open surgery. Laparoscopic surgery has a shorter recovery time. Your common bile duct may also need to be examined during the procedure. If stones are found in the common bile duct, they may be removed. LET [facility] CARE PROVIDER KNOW ABOUT:  Any allergies you have.  All medicines you are taking, including vitamins, herbs, eye drops, creams, and over-the-counter medicines.  Previous problems you or members of your family have had with the use of anesthetics.  Any blood disorders you have.  Previous surgeries you have had.  Medical conditions you have. RISKS AND COMPLICATIONS Generally, this is a safe procedure. However, as with any procedure, complications can occur. Possible complications include:  Infection.  Damage to the common bile duct, nerves, arteries, veins, or other internal organs such as the stomach, liver, or intestines.  Bleeding.  A stone may remain in the common bile duct.  A bile leak from the cyst duct that is clipped when your gallbladder is removed.  The need to convert to open surgery, which requires a larger incision in the abdomen. This may be necessary if your surgeon thinks it  is not safe to continue with a laparoscopic procedure. BEFORE THE PROCEDURE  Ask your health care provider about changing or stopping any regular medicines. You will need to stop taking aspirin or blood thinners at least 5 days prior to surgery.  Do not eat or drink anything after midnight the night before surgery.  Let your health care provider know if you develop a cold or other infectious problem before surgery. PROCEDURE   You will be given medicine to make you sleep through the procedure (general  anesthetic). A breathing tube will be placed in your mouth.  When you are asleep, your surgeon will make several small cuts (incisions) in your abdomen.  A thin, lighted tube with a tiny camera on the end (laparoscope) is inserted through one of the small incisions. The camera on the laparoscope sends a picture to a TV screen in the operating room. This gives the surgeon a good view inside your abdomen.  A gas will be pumped into your abdomen. This expands your abdomen so that the surgeon has more room to perform the surgery.  Other tools needed for the procedure are inserted through the other incisions. The gallbladder is removed through one of the incisions.  After the removal of your gallbladder, the incisions will be closed with stitches, staples, or skin glue. AFTER THE PROCEDURE  You will be taken to a recovery area where your progress will be checked often.  You may be allowed to go home the same day if your pain is controlled and you can tolerate liquids. Document Released: 10/21/2005 Document Revised: 08/11/2013 Document Reviewed: 06/02/2013 [facility] Patient Information 2014 [place]. PATIENT INSTRUCTIONS POST-ANESTHESIA  IMMEDIATELY FOLLOWING SURGERY:  Do not drive or operate machinery for the first twenty four hours after surgery.  Do not make any important decisions for twenty four hours after surgery or while taking narcotic pain medications or sedatives.  If you develop intractable nausea and vomiting or a severe headache please notify your doctor immediately.  FOLLOW-UP:  Please make an appointment with your surgeon as instructed. You do not need to follow up with anesthesia unless specifically instructed to do so.  WOUND CARE INSTRUCTIONS (if applicable):  Keep a dry clean dressing on the anesthesia/puncture wound site if there is drainage.  Once the wound has quit draining you may leave it open to air.  Generally you should leave the bandage intact for twenty four  hours unless there is drainage.  If the epidural site drains for more than 36-48 hours please call the anesthesia department.  QUESTIONS?:  Please feel free to call your physician or the hospital operator if you have any questions, and they will be happy to assist you.

## 2014-04-19 NOTE — Pre-Procedure Instructions (Signed)
Dr Arnoldo Morale aware of WBC. No orders given.

## 2014-04-20 NOTE — Pre-Procedure Instructions (Signed)
Dr. Patsey Berthold made aware of pt's WBC of 2.9. He was ok with it for surgery, no orders given.

## 2014-04-25 ENCOUNTER — Ambulatory Visit (HOSPITAL_COMMUNITY)
Admission: RE | Admit: 2014-04-25 | Discharge: 2014-04-25 | Disposition: A | Discharge status: Home / Self Care | Payer: 59 | Source: Ambulatory Visit | Attending: General Surgery | Admitting: General Surgery

## 2014-04-25 ENCOUNTER — Encounter (HOSPITAL_COMMUNITY): Payer: 59 | Admitting: Anesthesiology

## 2014-04-25 ENCOUNTER — Encounter (HOSPITAL_COMMUNITY): Payer: Self-pay | Admitting: *Deleted

## 2014-04-25 ENCOUNTER — Ambulatory Visit (HOSPITAL_COMMUNITY): Payer: 59 | Admitting: Anesthesiology

## 2014-04-25 ENCOUNTER — Encounter (HOSPITAL_COMMUNITY)
Admission: RE | Disposition: A | Discharge status: Home / Self Care | Payer: Self-pay | Source: Ambulatory Visit | Attending: General Surgery

## 2014-04-25 DIAGNOSIS — Z79899 Other long term (current) drug therapy: Secondary | ICD-10-CM | POA: Insufficient documentation

## 2014-04-25 DIAGNOSIS — I10 Essential (primary) hypertension: Secondary | ICD-10-CM | POA: Insufficient documentation

## 2014-04-25 DIAGNOSIS — Z87891 Personal history of nicotine dependence: Secondary | ICD-10-CM | POA: Insufficient documentation

## 2014-04-25 DIAGNOSIS — K8010 Calculus of gallbladder with chronic cholecystitis without obstruction: Secondary | ICD-10-CM | POA: Insufficient documentation

## 2014-04-25 HISTORY — PX: CHOLECYSTECTOMY: SHX55

## 2014-04-25 SURGERY — LAPAROSCOPIC CHOLECYSTECTOMY
Anesthesia: General | Site: Abdomen

## 2014-04-25 MED ORDER — LACTATED RINGERS IV SOLN
INTRAVENOUS | Status: DC | PRN
  Administered 2014-04-25: 08:00:00 via INTRAVENOUS

## 2014-04-25 MED ORDER — FENTANYL CITRATE 0.05 MG/ML IJ SOLN
INTRAMUSCULAR | Status: AC
  Filled 2014-04-25: qty 5

## 2014-04-25 MED ORDER — PROPOFOL 10 MG/ML IV BOLUS
INTRAVENOUS | Status: DC | PRN
  Administered 2014-04-25: 150 mg via INTRAVENOUS

## 2014-04-25 MED ORDER — NEOSTIGMINE METHYLSULFATE 10 MG/10ML IV SOLN
INTRAVENOUS | Status: DC | PRN
  Administered 2014-04-25: 4 mg via INTRAVENOUS

## 2014-04-25 MED ORDER — GLYCOPYRROLATE 0.2 MG/ML IJ SOLN
0.2000 mg | Freq: Once | INTRAMUSCULAR | Status: AC
  Administered 2014-04-25: 0.2 mg via INTRAVENOUS
  Filled 2014-04-25: qty 1

## 2014-04-25 MED ORDER — ROCURONIUM BROMIDE 100 MG/10ML IV SOLN
INTRAVENOUS | Status: DC | PRN
  Administered 2014-04-25: 5 mg via INTRAVENOUS
  Administered 2014-04-25 (×2): 10 mg via INTRAVENOUS
  Administered 2014-04-25: 25 mg via INTRAVENOUS

## 2014-04-25 MED ORDER — BUPIVACAINE HCL (PF) 0.5 % IJ SOLN
INTRAMUSCULAR | Status: DC | PRN
  Administered 2014-04-25: 10 mL

## 2014-04-25 MED ORDER — FENTANYL CITRATE 0.05 MG/ML IJ SOLN
25.0000 ug | INTRAMUSCULAR | Status: DC | PRN
  Administered 2014-04-25 (×2): 25 ug via INTRAVENOUS
  Administered 2014-04-25: 50 ug via INTRAVENOUS

## 2014-04-25 MED ORDER — HEMOSTATIC AGENTS (NO CHARGE) OPTIME
TOPICAL | Status: DC | PRN
  Administered 2014-04-25: 1 via TOPICAL

## 2014-04-25 MED ORDER — LACTATED RINGERS IV SOLN
INTRAVENOUS | Status: DC
  Administered 2014-04-25: 08:00:00 via INTRAVENOUS

## 2014-04-25 MED ORDER — PROPOFOL 10 MG/ML IV EMUL
INTRAVENOUS | Status: AC
  Filled 2014-04-25: qty 20

## 2014-04-25 MED ORDER — LIDOCAINE HCL (PF) 1 % IJ SOLN
INTRAMUSCULAR | Status: AC
  Filled 2014-04-25: qty 5

## 2014-04-25 MED ORDER — OXYCODONE-ACETAMINOPHEN 7.5-325 MG PO TABS: 1.0000 | ORAL_TABLET | ORAL | Status: DC | PRN

## 2014-04-25 MED ORDER — CHLORHEXIDINE GLUCONATE 4 % EX LIQD: 1.0000 "application " | Freq: Once | CUTANEOUS | Status: DC

## 2014-04-25 MED ORDER — POVIDONE-IODINE 10 % OINT PACKET
TOPICAL_OINTMENT | CUTANEOUS | Status: DC | PRN
  Administered 2014-04-25: 1 via TOPICAL

## 2014-04-25 MED ORDER — CIPROFLOXACIN IN D5W 400 MG/200ML IV SOLN
INTRAVENOUS | Status: AC
  Filled 2014-04-25: qty 200

## 2014-04-25 MED ORDER — CIPROFLOXACIN IN D5W 400 MG/200ML IV SOLN
400.0000 mg | INTRAVENOUS | Status: AC
  Administered 2014-04-25: 400 mg via INTRAVENOUS

## 2014-04-25 MED ORDER — LIDOCAINE HCL 1 % IJ SOLN
INTRAMUSCULAR | Status: DC | PRN
  Administered 2014-04-25: 40 mg via INTRADERMAL

## 2014-04-25 MED ORDER — SUCCINYLCHOLINE CHLORIDE 20 MG/ML IJ SOLN
INTRAMUSCULAR | Status: DC | PRN
  Administered 2014-04-25: 110 mg via INTRAVENOUS

## 2014-04-25 MED ORDER — SODIUM CHLORIDE 0.9 % IR SOLN
Status: DC | PRN
  Administered 2014-04-25: 1000 mL

## 2014-04-25 MED ORDER — ONDANSETRON HCL 4 MG/2ML IJ SOLN
4.0000 mg | Freq: Once | INTRAMUSCULAR | Status: AC
  Administered 2014-04-25: 4 mg via INTRAVENOUS
  Filled 2014-04-25: qty 2

## 2014-04-25 MED ORDER — KETOROLAC TROMETHAMINE 30 MG/ML IJ SOLN
30.0000 mg | Freq: Once | INTRAMUSCULAR | Status: AC
  Administered 2014-04-25: 30 mg via INTRAVENOUS
  Filled 2014-04-25: qty 1

## 2014-04-25 MED ORDER — FENTANYL CITRATE 0.05 MG/ML IJ SOLN
INTRAMUSCULAR | Status: DC | PRN
  Administered 2014-04-25 (×5): 50 ug via INTRAVENOUS

## 2014-04-25 MED ORDER — ROCURONIUM BROMIDE 50 MG/5ML IV SOLN
INTRAVENOUS | Status: AC
  Filled 2014-04-25: qty 1

## 2014-04-25 MED ORDER — MIDAZOLAM HCL 2 MG/2ML IJ SOLN
1.0000 mg | INTRAMUSCULAR | Status: DC | PRN
Start: 2014-04-25 — End: 2014-04-25
  Administered 2014-04-25: 2 mg via INTRAVENOUS
  Filled 2014-04-25: qty 2

## 2014-04-25 MED ORDER — GLYCOPYRROLATE 0.2 MG/ML IJ SOLN
INTRAMUSCULAR | Status: DC | PRN
  Administered 2014-04-25: .7 mg via INTRAVENOUS

## 2014-04-25 MED ORDER — ONDANSETRON HCL 4 MG/2ML IJ SOLN: 4.0000 mg | Freq: Once | INTRAMUSCULAR | Status: DC | PRN

## 2014-04-25 MED ORDER — FENTANYL CITRATE 0.05 MG/ML IJ SOLN
INTRAMUSCULAR | Status: AC
  Filled 2014-04-25: qty 2

## 2014-04-25 MED ORDER — SUCCINYLCHOLINE CHLORIDE 20 MG/ML IJ SOLN
INTRAMUSCULAR | Status: AC
  Filled 2014-04-25: qty 1

## 2014-04-25 SURGICAL SUPPLY — 48 items
APPLIER CLIP LAPSCP 10X32 DD (CLIP) ×3 IMPLANT
BAG HAMPER (MISCELLANEOUS) ×3 IMPLANT
BAG SPEC RTRVL LRG 6X4 10 (ENDOMECHANICALS) ×1
BLADE 11 SAFETY STRL DISP (BLADE) IMPLANT
CLOTH BEACON ORANGE TIMEOUT ST (SAFETY) ×3 IMPLANT
COVER LIGHT HANDLE STERIS (MISCELLANEOUS) ×6 IMPLANT
DECANTER SPIKE VIAL GLASS SM (MISCELLANEOUS) ×3 IMPLANT
DURAPREP 26ML APPLICATOR (WOUND CARE) ×3 IMPLANT
ELECT REM PT RETURN 9FT ADLT (ELECTROSURGICAL) ×3
ELECTRODE REM PT RTRN 9FT ADLT (ELECTROSURGICAL) ×1 IMPLANT
EVACUATOR SMOKE 8.L (FILTER) ×3 IMPLANT
FILTER SMOKE EVAC LAPAROSHD (FILTER) ×3 IMPLANT
FORMALIN 10 PREFIL 120ML (MISCELLANEOUS) ×3 IMPLANT
GLOVE BIOGEL PI IND STRL 6.5 (GLOVE) ×1 IMPLANT
GLOVE BIOGEL PI IND STRL 7.0 (GLOVE) ×1 IMPLANT
GLOVE BIOGEL PI IND STRL 7.5 (GLOVE) ×2 IMPLANT
GLOVE BIOGEL PI INDICATOR 6.5 (GLOVE) ×2
GLOVE BIOGEL PI INDICATOR 7.0 (GLOVE) ×2
GLOVE BIOGEL PI INDICATOR 7.5 (GLOVE) ×4
GLOVE ECLIPSE 6.5 STRL STRAW (GLOVE) ×4 IMPLANT
GLOVE ECLIPSE 7.5 STRL STRAW (GLOVE) ×2 IMPLANT
GLOVE SURG SS PI 7.5 STRL IVOR (GLOVE) ×3 IMPLANT
GOWN STRL REUS W/TWL LRG LVL3 (GOWN DISPOSABLE) ×9 IMPLANT
HEMOSTAT SNOW SURGICEL 2X4 (HEMOSTASIS) ×3 IMPLANT
INST SET LAPROSCOPIC AP (KITS) ×3 IMPLANT
IV NS IRRIG 3000ML ARTHROMATIC (IV SOLUTION) IMPLANT
KIT ROOM TURNOVER APOR (KITS) ×3 IMPLANT
MANIFOLD NEPTUNE II (INSTRUMENTS) ×3 IMPLANT
NDL INSUFFLATION 14GA 120MM (NEEDLE) ×1 IMPLANT
NEEDLE INSUFFLATION 14GA 120MM (NEEDLE) ×3 IMPLANT
NS IRRIG 1000ML POUR BTL (IV SOLUTION) ×3 IMPLANT
PACK LAP CHOLE LZT030E (CUSTOM PROCEDURE TRAY) ×3 IMPLANT
PAD ARMBOARD 7.5X6 YLW CONV (MISCELLANEOUS) ×3 IMPLANT
POUCH SPECIMEN RETRIEVAL 10MM (ENDOMECHANICALS) ×3 IMPLANT
SET BASIN LINEN APH (SET/KITS/TRAYS/PACK) ×3 IMPLANT
SET TUBE IRRIG SUCTION NO TIP (IRRIGATION / IRRIGATOR) IMPLANT
SLEEVE ENDOPATH XCEL 5M (ENDOMECHANICALS) ×3 IMPLANT
SPONGE GAUZE 2X2 8PLY STER LF (GAUZE/BANDAGES/DRESSINGS) ×4
SPONGE GAUZE 2X2 8PLY STRL LF (GAUZE/BANDAGES/DRESSINGS) ×8 IMPLANT
STAPLER VISISTAT (STAPLE) ×3 IMPLANT
SUT VICRYL 0 UR6 27IN ABS (SUTURE) ×3 IMPLANT
TAPE CLOTH SURG 4X10 WHT LF (GAUZE/BANDAGES/DRESSINGS) ×3 IMPLANT
TROCAR ENDO BLADELESS 11MM (ENDOMECHANICALS) ×3 IMPLANT
TROCAR XCEL NON-BLD 5MMX100MML (ENDOMECHANICALS) ×3 IMPLANT
TROCAR XCEL UNIV SLVE 11M 100M (ENDOMECHANICALS) ×3 IMPLANT
TUBING INSUFFLATION (TUBING) ×3 IMPLANT
WARMER LAPAROSCOPE (MISCELLANEOUS) ×3 IMPLANT
YANKAUER SUCT 12FT TUBE ARGYLE (SUCTIONS) ×3 IMPLANT

## 2014-04-25 NOTE — Anesthesia Preprocedure Evaluation (Signed)
Anesthesia Evaluation  Patient identified by MRN, date of birth, ID band Patient awake    Reviewed: Allergy & Precautions, H&P , NPO status , Patient's Chart, lab work & pertinent test results  Airway Mallampati: I TM Distance: >3 FB Neck ROM: Full    Dental  (+) Teeth Intact   Pulmonary former smoker,  breath sounds clear to auscultation        Cardiovascular hypertension, Pt. on medications Rhythm:Regular Rate:Normal     Neuro/Psych    GI/Hepatic negative GI ROS,   Endo/Other    Renal/GU      Musculoskeletal   Abdominal   Peds  Hematology   Anesthesia Other Findings   Reproductive/Obstetrics                           Anesthesia Physical Anesthesia Plan  ASA: II  Anesthesia Plan: General   Post-op Pain Management:    Induction: Intravenous  Airway Management Planned: Oral ETT  Additional Equipment:   Intra-op Plan:   Post-operative Plan: Extubation in OR  Informed Consent: I have reviewed the patients History and Physical, chart, labs and discussed the procedure including the risks, benefits and alternatives for the proposed anesthesia with the patient or authorized representative who has indicated his/her understanding and acceptance.     Plan Discussed with:   Anesthesia Plan Comments:         Anesthesia Quick Evaluation

## 2014-04-25 NOTE — Anesthesia Procedure Notes (Signed)
Procedure Name: Intubation Date/Time: 04/25/2014 8:22 AM Performed by: Tressie Stalker E Pre-anesthesia Checklist: Patient identified, Patient being monitored, Timeout performed, Emergency Drugs available and Suction available Patient Re-evaluated:Patient Re-evaluated prior to inductionOxygen Delivery Method: Circle System Utilized Preoxygenation: Pre-oxygenation with 100% oxygen Intubation Type: IV induction Ventilation: Mask ventilation without difficulty Laryngoscope Size: Mac and 3 Grade View: Grade I Tube type: Oral Tube size: 7.0 mm Number of attempts: 1 Airway Equipment and Method: stylet Placement Confirmation: ETT inserted through vocal cords under direct vision,  positive ETCO2 and breath sounds checked- equal and bilateral Secured at: 22 cm Tube secured with: Tape Dental Injury: Teeth and Oropharynx as per pre-operative assessment

## 2014-04-25 NOTE — Transfer of Care (Signed)
Immediate Anesthesia Transfer of Care Note  Patient: Bryce Martinez  Procedure(s) Performed: Procedure(s): LAPAROSCOPIC CHOLECYSTECTOMY (N/A)  Patient Location: PACU  Anesthesia Type:General  Level of Consciousness: sedated  Airway & Oxygen Therapy: Patient Spontanous Breathing and Patient connected to face mask oxygen  Post-op Assessment: Report given to PACU RN  Post vital signs: Reviewed and stable  Complications: No apparent anesthesia complications

## 2014-04-25 NOTE — Op Note (Signed)
Patient:  Bryce Martinez  DOB:  [birth year]  MRN:  [id number]   Preop Diagnosis:  Cholecystitis, cholelithiasis  Postop Diagnosis:  Same  Procedure:  Laparoscopic cholecystectomy  Surgeon:  Aviva Signs, M.D.  Anes:  General endotracheal  Indications:  Patient is a 63 year old white male who presents with biliary colic secondary to cholecystitis. The risks and benefits of the procedure including bleeding, infection, hepatobiliary, and the possibility of an open procedure were fully explained to the patient, who gave informed consent.  Procedure note:  The patient's placed the supine position. After induction of general endotracheal anesthesia, the abdomen was prepped and draped using usual sterile technique with DuraPrep. Surgical site confirmation was performed.  An infraumbilical incision was made down to the fascia. A Veress needle was introduced into the abdominal cavity and confirmation of placement was done using the saline drop test. The abdomen was then insufflated to 16 mm mercury pressure. An 11 mm trocar was introduced into the abdominal cavity under direct visualization without difficulty. The patient was placed in reverse Trendelenburg position and additional 11 mm trocar was placed the epigastric region and 5 mm trochars were placed the right the quadrant right flank regions. The liver was inspected and noted within normal limits. The gallbladder was retracted in a dynamic fashion in order to expose the triangle of Calot. The cystic duct was first identified. Its junction to the infundibulum was fully identified. Endoclips placed proximally and distally on the cystic duct, and the cystic duct was divided. This was likewise done to the cystic artery. The gallbladder was then freed away from the gallbladder fossa using Bovie electrocautery. The gallbladder was delivered through the epigastric trocar site using an Endo Catch bag. The gallbladder fossa was inspected and no abnormal  bleeding or bile leakage was noted. Surgicel is placed the gallbladder fossa. All fluid and air were then evacuated from the abdominal cavity prior to removal of the trochars.  All wounds were irrigated with normal saline. All wounds were injected with 0.5% Sensorcaine. The infraumbilical fashion as well as epigastric fascia reapproximated using 0 Vicryl interrupted sutures. All skin incisions were closed using staples. Betadine ointment and dry sterile dressings were applied.  All tape and needle counts were correct the end of the procedure. Patient was extubated in the operating room and transferred to PACU in stable condition.  Complications:  None  EBL:  Minimal  Specimen:  Gallbladder

## 2014-04-25 NOTE — Interval H&P Note (Signed)
History and Physical Interval Note:  04/25/2014 7:51 AM  Bryce Martinez  has presented today for surgery, with the diagnosis of cholelithiasis  The various methods of treatment have been discussed with the patient and family. After consideration of risks, benefits and other options for treatment, the patient has consented to  Procedure(s): LAPAROSCOPIC CHOLECYSTECTOMY (N/A) as a surgical intervention .  The patient's history has been reviewed, patient examined, no change in status, stable for surgery.  I have reviewed the patient's chart and labs.  Questions were answered to the patient's satisfaction.     Aviva Signs A

## 2014-04-25 NOTE — Anesthesia Postprocedure Evaluation (Signed)
  Anesthesia Post-op Note  Patient: Bryce Martinez  Procedure(s) Performed: Procedure(s): LAPAROSCOPIC CHOLECYSTECTOMY (N/A)  Patient Location: PACU  Anesthesia Type:General  Level of Consciousness: awake  Airway and Oxygen Therapy: Patient Spontanous Breathing and Patient connected to face mask oxygen  Post-op Pain: none  Post-op Assessment: Post-op Vital signs reviewed, Patient's Cardiovascular Status Stable, Respiratory Function Stable, Patent Airway and No signs of Nausea or vomiting  Post-op Vital Signs: Reviewed and stable  Last Vitals:  Filed Vitals:   04/25/14 0800  BP: 156/85  Pulse:   Temp:   Resp: 25    Complications: No apparent anesthesia complications

## 2014-04-25 NOTE — Discharge Instructions (Signed)

## 2014-04-26 ENCOUNTER — Encounter (HOSPITAL_COMMUNITY): Payer: Self-pay | Admitting: General Surgery

## 2014-06-14 ENCOUNTER — Other Ambulatory Visit: Payer: Self-pay | Admitting: Family Medicine

## 2014-08-03 ENCOUNTER — Telehealth: Payer: Self-pay | Admitting: Family Medicine

## 2014-08-03 MED ORDER — TADALAFIL 20 MG PO TABS: 20.0000 mg | ORAL_TABLET | Freq: Every day | ORAL | Status: DC | PRN

## 2014-08-03 NOTE — Telephone Encounter (Signed)
rx printed and pt can come collect

## 2014-10-10 ENCOUNTER — Ambulatory Visit (INDEPENDENT_AMBULATORY_CARE_PROVIDER_SITE_OTHER): Payer: 59 | Admitting: Family Medicine

## 2014-10-10 ENCOUNTER — Encounter: Payer: Self-pay | Admitting: Family Medicine

## 2014-10-10 VITALS — BP 136/86 | HR 66 | Resp 18 | Ht 74.0 in | Wt 182.0 lb

## 2014-10-10 DIAGNOSIS — Z23 Encounter for immunization: Secondary | ICD-10-CM | POA: Insufficient documentation

## 2014-10-10 DIAGNOSIS — M5440 Lumbago with sciatica, unspecified side: Secondary | ICD-10-CM

## 2014-10-10 DIAGNOSIS — K7689 Other specified diseases of liver: Secondary | ICD-10-CM

## 2014-10-10 DIAGNOSIS — G8929 Other chronic pain: Secondary | ICD-10-CM | POA: Insufficient documentation

## 2014-10-10 DIAGNOSIS — R945 Abnormal results of liver function studies: Secondary | ICD-10-CM

## 2014-10-10 DIAGNOSIS — R7302 Impaired glucose tolerance (oral): Secondary | ICD-10-CM

## 2014-10-10 DIAGNOSIS — Z125 Encounter for screening for malignant neoplasm of prostate: Secondary | ICD-10-CM

## 2014-10-10 DIAGNOSIS — R894 Abnormal immunological findings in specimens from other organs, systems and tissues: Secondary | ICD-10-CM

## 2014-10-10 DIAGNOSIS — M542 Cervicalgia: Secondary | ICD-10-CM | POA: Insufficient documentation

## 2014-10-10 DIAGNOSIS — E785 Hyperlipidemia, unspecified: Secondary | ICD-10-CM

## 2014-10-10 DIAGNOSIS — I10 Essential (primary) hypertension: Secondary | ICD-10-CM

## 2014-10-10 DIAGNOSIS — R768 Other specified abnormal immunological findings in serum: Secondary | ICD-10-CM

## 2014-10-10 NOTE — Assessment & Plan Note (Signed)
Vaccine administered at visit.  

## 2014-10-10 NOTE — Progress Notes (Signed)
Subjective:    Patient ID: Bryce Martinez, male    DOB: 1951-02-16, 63 y.o.   MRN: [id number]  HPI The PT is here for follow up and re-evaluation of chronic medical conditions, medication management and review of any available recent lab and radiology data.  Preventive health is updated, specifically  Cancer screening and Immunization.  Needs TdAP and rectal exam will do latter next visit, denies change in BM Had cholecystectomy since last visit, improved, feels better The PT denies any adverse reactions to current medications since the last visit.  C/o daily low back pain, sometimes radiates to mid thigh, no weakness or ongoing numbness, no incontinence of stool or urine C/o intermittent numbness/ tingling on right side of neck , not aggravated by movement, wants to hold off on X ray of spine at this time, I explained symptom likely due to arthritis and disc disease causing nerve irritation Has questions about hep c , if he needs treatment, c/ointermittent RUq pain ., npo fevr, was evaluated in the past by GI as his wife was treated for this, i will send msg to GI and see if re evaluation is OK from their perspective    Review of Systems See HPI Denies recent fever or chills. Denies sinus pressure, nasal congestion, ear pain or sore throat. Denies chest congestion, productive cough or wheezing. Denies chest pains, palpitations and leg swelling Denies a, nausea, vomiting,diarrhea or constipation.   Denies dysuria, frequency, hesitancy or incontinence. Denies headaches, seizures, numbness, or tingling. Denies depression, anxiety or insomnia. Denies skin break down or rash.        Objective:   Physical Exam BP 136/80 mmHg  Pulse 66  Resp 18  Ht 6\' 2"  (1.88 m)  Wt 182 lb 0.6 oz (82.573 kg)  BMI 23.36 kg/m2  SpO2 100% Patient alert and oriented and in no cardiopulmonary distress.  HEENT: No facial asymmetry, EOMI,   oropharynx pink and moist.  Neck supple no JVD, no  mass. No bruit, no thyromegaly Chest: Clear to auscultation bilaterally.  CVS: S1, S2 no murmurs, no S3.Regular rate.  ABD: Soft non tender.   Ext: No edema  MS: Adequate ROM spine, shoulders, hips and knees.  Skin: Intact, no ulcerations or rash noted.  Psych: Good eye contact, normal affect. Memory intact not anxious or depressed appearing.  CNS: CN 2-12 intact, power,  normal throughout.no focal deficits noted.        Assessment & Plan:  Essential hypertension Sub optimal, though adequate control DASH diet and commitment to daily physical activity for a minimum of 30 minutes discussed and encouraged, as a part of hypertension management. The importance of attaining a healthy weight is also discussed. No med change  IGT (impaired glucose tolerance) Patient educated about the importance of limiting  Carbohydrate intake , the need to commit to daily physical activity for a minimum of 30 minutes , and to commit weight loss. The fact that changes in all these areas will reduce or eliminate all together the development of diabetes is stressed.   Updated lab needed at/ before next visit.   Dyslipidemia Hyperlipidemia:Low fat diet discussed and encouraged.  Updated lab needed at/ before next visit.   Back pain Chronic daily back pian increased in frequency esp during Winter, advised use of tylenol 325mg  one to two daily in place of ibuprofen, gabapentin offered but no interest currently  Neck pain on right side Likely due to arthritis, pr holding off on imaging study at this  time  Need for diphtheria-tetanus-pertussis (Tdap) vaccine, adult/adolescent Vaccine administered at visit.   Hepatitis C antibody test positive Pt has questions/ concerns as to whether he needs treatment. Will contact GI Doc who has sen him in the past  And get back with pt

## 2014-10-10 NOTE — Patient Instructions (Addendum)
CPE with rectal in 4 month, call if you need me before  Pls reduce salt and commit to regular EXERCISE, bp SLIGHTLY HIGHER THAN GOAL TODAY    You are referred to Dr Laural Golden about liver  Tylenol 325 mg up to 2 daily for back and geenral pian  Call in for xray of neck if you decide ion this  PSA , fasting lipid, cmp , hBa1C by end of year

## 2014-10-10 NOTE — Assessment & Plan Note (Signed)
Pt has questions/ concerns as to whether he needs treatment. Will contact GI Doc who has sen him in the past  And get back with pt

## 2014-10-10 NOTE — Assessment & Plan Note (Signed)
Hyperlipidemia:Low fat diet discussed and encouraged.  Updated lab needed at/ before next visit.  

## 2014-10-10 NOTE — Assessment & Plan Note (Signed)
Patient educated about the importance of limiting  Carbohydrate intake , the need to commit to daily physical activity for a minimum of 30 minutes , and to commit weight loss. The fact that changes in all these areas will reduce or eliminate all together the development of diabetes is stressed.   Updated lab needed at/ before next visit.  

## 2014-10-10 NOTE — Assessment & Plan Note (Signed)
Likely due to arthritis, pr holding off on imaging study at this time

## 2014-10-10 NOTE — Assessment & Plan Note (Signed)
Sub optimal, though adequate control DASH diet and commitment to daily physical activity for a minimum of 30 minutes discussed and encouraged, as a part of hypertension management. The importance of attaining a healthy weight is also discussed. No med change

## 2014-10-10 NOTE — Assessment & Plan Note (Signed)
Chronic daily back pian increased in frequency esp during Winter, advised use of tylenol 325mg  one to two daily in place of ibuprofen, gabapentin offered but no interest currently

## 2014-10-12 ENCOUNTER — Other Ambulatory Visit: Payer: Self-pay | Admitting: Family Medicine

## 2014-10-12 DIAGNOSIS — R768 Other specified abnormal immunological findings in serum: Secondary | ICD-10-CM

## 2014-10-17 ENCOUNTER — Telehealth (INDEPENDENT_AMBULATORY_CARE_PROVIDER_SITE_OTHER): Payer: Self-pay | Admitting: *Deleted

## 2014-10-17 NOTE — Telephone Encounter (Signed)
LM at home for patient to return the call to schedule an apt.

## 2014-10-18 ENCOUNTER — Encounter (INDEPENDENT_AMBULATORY_CARE_PROVIDER_SITE_OTHER): Payer: Self-pay | Admitting: *Deleted

## 2014-10-22 ENCOUNTER — Other Ambulatory Visit: Payer: Self-pay | Admitting: Family Medicine

## 2014-10-23 LAB — COMPREHENSIVE METABOLIC PANEL
ALK PHOS: 47 U/L (ref 39–117)
ALT: 25 U/L (ref 0–53)
AST: 26 U/L (ref 0–37)
Albumin: 4.1 g/dL (ref 3.5–5.2)
BILIRUBIN TOTAL: 1.1 mg/dL (ref 0.2–1.2)
BUN: 12 mg/dL (ref 6–23)
CO2: 31 mEq/L (ref 19–32)
CREATININE: 1.11 mg/dL (ref 0.50–1.35)
Calcium: 9.7 mg/dL (ref 8.4–10.5)
Chloride: 100 mEq/L (ref 96–112)
Glucose, Bld: 88 mg/dL (ref 70–99)
Potassium: 4 mEq/L (ref 3.5–5.3)
SODIUM: 139 meq/L (ref 135–145)
TOTAL PROTEIN: 7.1 g/dL (ref 6.0–8.3)

## 2014-10-23 LAB — LIPID PANEL
CHOL/HDL RATIO: 3.4 ratio
Cholesterol: 186 mg/dL (ref 0–200)
HDL: 55 mg/dL (ref >39)
LDL CALC: 111 mg/dL — AB (ref 0–99)
Triglycerides: 100 mg/dL (ref <150)
VLDL: 20 mg/dL (ref 0–40)

## 2014-10-23 LAB — HEMOGLOBIN A1C
HEMOGLOBIN A1C: 5.7 % — AB (ref <5.7)
Mean Plasma Glucose: 117 mg/dL — ABNORMAL HIGH (ref <117)

## 2014-10-24 ENCOUNTER — Encounter: Payer: Self-pay | Admitting: Family Medicine

## 2014-10-24 LAB — PSA: PSA: 1.07 ng/mL (ref <=4.00)

## 2014-10-25 LAB — TSH: TSH: 2.292 u[IU]/mL (ref 0.350–4.500)

## 2014-11-28 ENCOUNTER — Ambulatory Visit (INDEPENDENT_AMBULATORY_CARE_PROVIDER_SITE_OTHER): Payer: 59 | Admitting: Internal Medicine

## 2014-11-28 ENCOUNTER — Encounter (INDEPENDENT_AMBULATORY_CARE_PROVIDER_SITE_OTHER): Payer: Self-pay | Admitting: Internal Medicine

## 2014-11-28 VITALS — BP 104/64 | HR 60 | Temp 97.7°F | Ht 73.0 in | Wt 181.9 lb

## 2014-11-28 DIAGNOSIS — B1920 Unspecified viral hepatitis C without hepatic coma: Secondary | ICD-10-CM

## 2014-11-28 LAB — HEPATITIS C ANTIBODY: HCV AB: REACTIVE — AB

## 2014-11-28 NOTE — Patient Instructions (Signed)
Hep C antibody, genotype, quantitative, PT/INR, AFP

## 2014-11-28 NOTE — Progress Notes (Signed)
Subjective:    Patient ID: Bryce Martinez, male    DOB: [birth year], 64 y.o.   MRN: [id number]  HPI Referred to our office for Hepatitis C.  Diagnosed in 2008 with Hepatitis C. Patient states he was seen by our office years ago.  Risk factors: wife positive for Hepatitis C. No tattoos. No IV drugs. No prior hx of jaundice. Appetite is good. No weight loss. BMs are normal. No melena or BRRB.   Works at [employer]. [place].  02/23/2009 HCV RNA quaint: positive. 01/09/2008 Hep B surface antigen negative Hep B Core IgM negative. Hep A IgM: negative.  CBC    Component Value Date/Time   WBC 2.9* 04/19/2014 0925   RBC 4.96 04/19/2014 0925   HGB 14.0 04/19/2014 0925   HCT 41.0 04/19/2014 0925   PLT 219 04/19/2014 0925   MCV 82.7 04/19/2014 0925   MCH 28.2 04/19/2014 0925   MCHC 34.1 04/19/2014 0925   RDW 13.2 04/19/2014 0925   LYMPHSABS 1.6 04/19/2014 0925   MONOABS 0.2 04/19/2014 0925   EOSABS 0.1 04/19/2014 0925   BASOSABS 0.0 04/19/2014 0925   CMP Latest Ref Rng 10/22/2014 04/19/2014 04/02/2014  Glucose 70 - 99 mg/dL 88 95 99  BUN 6 - 23 mg/dL 12 20 18   Creatinine 0.50 - 1.35 mg/dL 1.11 1.34 1.21  Sodium 135 - 145 mEq/L 139 140 139  Potassium 3.5 - 5.3 mEq/L 4.0 3.9 3.9  Chloride 96 - 112 mEq/L 100 100 101  CO2 19 - 32 mEq/L 31 31 31   Calcium 8.4 - 10.5 mg/dL 9.7 9.2 9.2  Total Protein 6.0 - 8.3 g/dL 7.1 6.9 -  Total Bilirubin 0.2 - 1.2 mg/dL 1.1 1.0 -  Alkaline Phos 39 - 117 U/L 47 45 -  AST 0 - 37 U/L 26 27 -  ALT 0 - 53 U/L 25 34 -        Review of Systems Married, two children in good health.    Past Medical History  Diagnosis Date  . ED (erectile dysfunction)   . Hypertension   . Hyperlipidemia   . Arthritis   . Allergy     Past Surgical History  Procedure Laterality Date  . Right shoulder surgery Right 03/2005    rotator cuff repair  . Appendectomy    . Hemicolectomy Right     lap hand assisted  . Cholecystectomy N/A 04/25/2014    Procedure:  LAPAROSCOPIC CHOLECYSTECTOMY;  Surgeon: Jamesetta So, MD;  Location: [facility];  Service: General;  Laterality: N/A;    No Known Allergies  Current Outpatient Prescriptions on File Prior to Visit  Medication Sig Dispense Refill  . aspirin EC 81 MG tablet Take 1 tablet (81 mg total) by mouth daily. 30 tablet 11  . tadalafil (CIALIS) 20 MG tablet Take 1 tablet (20 mg total) by mouth daily as needed for erectile dysfunction. 10 tablet 4  . triamterene-hydrochlorothiazide (MAXZIDE) 75-50 MG per tablet TAKE 1 TABLET BY MOUTH EVERY DAY 30 tablet 5   No current facility-administered medications on file prior to visit.       Objective:   Physical Exam   Filed Vitals:   11/28/14 1040  Height: 6\' 1"  (1.854 m)  Weight: 181 lb 14.4 oz (82.509 kg)   Alert and oriented. Skin warm and dry. Oral mucosa is moist.   . Sclera anicteric, conjunctivae is pink. Thyroid not enlarged. No cervical lymphadenopathy. Lungs clear. Heart regular rate and rhythm.  Abdomen is soft. Bowel sounds  are positive. No hepatomegaly. No abdominal masses felt. No tenderness.  No edema to lower extremities.          Assessment & Plan:  Hepatitis C. Desiring treatment.  Hep C antibody, Hep C quaint. Hep C genotype, AFP Further recommendations once we have labs back. If positive will need a liver biopsy.

## 2014-11-29 LAB — HEPATITIS C RNA QUANTITATIVE
HCV QUANT LOG: 6.92 {Log} — AB (ref <1.18)
HCV QUANT LOG: 6.94 {Log} — AB (ref <1.18)
HCV Quantitative: 8377581 IU/mL — ABNORMAL HIGH (ref <15)
HCV Quantitative: 8666472 IU/mL — ABNORMAL HIGH (ref <15)

## 2014-11-29 LAB — PROTIME-INR
INR: 1.07 (ref <1.50)
Prothrombin Time: 13.9 seconds (ref 11.6–15.2)

## 2014-11-29 LAB — AFP TUMOR MARKER: AFP TUMOR MARKER: 5.6 ng/mL (ref <6.1)

## 2014-12-02 LAB — HEPATITIS C GENOTYPE: HCV Genotype: 2

## 2014-12-05 ENCOUNTER — Telehealth (INDEPENDENT_AMBULATORY_CARE_PROVIDER_SITE_OTHER): Payer: Self-pay | Admitting: Internal Medicine

## 2014-12-05 DIAGNOSIS — B1920 Unspecified viral hepatitis C without hepatic coma: Secondary | ICD-10-CM

## 2014-12-05 NOTE — Telephone Encounter (Signed)
US Biopsy of liver ordered

## 2014-12-07 NOTE — Addendum Note (Signed)
Addended by: Butch Penny on: 12/07/2014 04:51 PM   Modules accepted: Orders

## 2014-12-07 NOTE — Telephone Encounter (Signed)
Bryce Martinez will cancel the Liver Biopsy. Will order an [place] elastrography of the abdomen.

## 2014-12-13 ENCOUNTER — Ambulatory Visit (HOSPITAL_COMMUNITY)
Admission: RE | Admit: 2014-12-13 | Discharge: 2014-12-13 | Disposition: A | Discharge status: Home / Self Care | Payer: 59 | Source: Ambulatory Visit | Attending: Internal Medicine | Admitting: Internal Medicine

## 2014-12-13 ENCOUNTER — Telehealth (INDEPENDENT_AMBULATORY_CARE_PROVIDER_SITE_OTHER): Payer: Self-pay | Admitting: Internal Medicine

## 2014-12-13 DIAGNOSIS — B1920 Unspecified viral hepatitis C without hepatic coma: Secondary | ICD-10-CM | POA: Diagnosis present

## 2014-12-13 NOTE — Telephone Encounter (Signed)
Bryce Martinez, will treat with Sovaldi and Ribavirin 1200mg  x 12 weeks.

## 2014-12-14 NOTE — Telephone Encounter (Signed)
Paper work completed and being sent to [place] for PA to be done.

## 2014-12-20 ENCOUNTER — Ambulatory Visit (HOSPITAL_COMMUNITY): Payer: 59

## 2014-12-20 ENCOUNTER — Telehealth (INDEPENDENT_AMBULATORY_CARE_PROVIDER_SITE_OTHER): Payer: Self-pay | Admitting: *Deleted

## 2014-12-20 NOTE — Telephone Encounter (Signed)
I have verified information for the [facility]

## 2014-12-20 NOTE — Telephone Encounter (Signed)
[facility]   ? About his medication that was sent to them from [facility]  Please call back [place] called

## 2014-12-23 ENCOUNTER — Telehealth (INDEPENDENT_AMBULATORY_CARE_PROVIDER_SITE_OTHER): Payer: Self-pay | Admitting: *Deleted

## 2014-12-23 NOTE — Telephone Encounter (Signed)
Patient was called and made aware that we had rec'd information that he was approved for the Patient Access [employer] Program. He states that he talked with them on 12/22/14, and gave them information and he was advised that he was to rec'v his medication today. He will call our office and leave a message of the day/date that he starts his medication. A copy of what we rec'd is being mailed to the patient upon his request.

## 2014-12-26 ENCOUNTER — Encounter (INDEPENDENT_AMBULATORY_CARE_PROVIDER_SITE_OTHER): Payer: Self-pay | Admitting: *Deleted

## 2014-12-26 ENCOUNTER — Telehealth (INDEPENDENT_AMBULATORY_CARE_PROVIDER_SITE_OTHER): Payer: Self-pay | Admitting: *Deleted

## 2014-12-26 DIAGNOSIS — B182 Chronic viral hepatitis C: Secondary | ICD-10-CM

## 2014-12-26 NOTE — Telephone Encounter (Signed)
Per Dr.Rehman the patient will need to have a CBC in 2 weeks as he is on Ribavirin. In 4 weeks get CBC/DIff , Hepatic Profile and Hepatitis C Quant.

## 2014-12-26 NOTE — Telephone Encounter (Signed)
Noted  

## 2014-12-26 NOTE — Telephone Encounter (Signed)
Patient is being treated with  Sovaldi and Ribavirin. Patient may need lab work in 2 weeks verses 4 weeks. Forward to Terri to make me aware of labs that are needed.

## 2014-12-26 NOTE — Telephone Encounter (Signed)
LAB WORK IN 4 WEEKS. Hepatic profile, CBC with diff.

## 2014-12-26 NOTE — Telephone Encounter (Signed)
Bryce Martinez received his Hep C medication on 12/23/14 and started on 12/24/14. A 4 week f/u apt has been scheduled for 01/26/15 with Deberah Castle, NP. He also was advised to get his lab work done at least 2 days before his apt. By 01/24/15.

## 2015-01-05 ENCOUNTER — Other Ambulatory Visit: Payer: Self-pay | Admitting: Family Medicine

## 2015-01-10 LAB — CBC WITH DIFFERENTIAL/PLATELET
Basophils Absolute: 0 10*3/uL (ref 0.0–0.1)
Basophils Relative: 1 % (ref 0–1)
Eosinophils Absolute: 0.1 10*3/uL (ref 0.0–0.7)
Eosinophils Relative: 3 % (ref 0–5)
HCT: 38.6 % — ABNORMAL LOW (ref 39.0–52.0)
Hemoglobin: 13.1 g/dL (ref 13.0–17.0)
LYMPHS ABS: 2.1 10*3/uL (ref 0.7–4.0)
Lymphocytes Relative: 49 % — ABNORMAL HIGH (ref 12–46)
MCH: 28.2 pg (ref 26.0–34.0)
MCHC: 33.9 g/dL (ref 30.0–36.0)
MCV: 83.2 fL (ref 78.0–100.0)
MONO ABS: 0.3 10*3/uL (ref 0.1–1.0)
MONOS PCT: 8 % (ref 3–12)
MPV: 9.8 fL (ref 8.6–12.4)
Neutro Abs: 1.6 10*3/uL — ABNORMAL LOW (ref 1.7–7.7)
Neutrophils Relative %: 39 % — ABNORMAL LOW (ref 43–77)
Platelets: 237 10*3/uL (ref 150–400)
RBC: 4.64 MIL/uL (ref 4.22–5.81)
RDW: 14.9 % (ref 11.5–15.5)
WBC: 4.2 10*3/uL (ref 4.0–10.5)

## 2015-01-10 LAB — HEPATIC FUNCTION PANEL
ALBUMIN: 4.1 g/dL (ref 3.5–5.2)
ALK PHOS: 42 U/L (ref 39–117)
ALT: 23 U/L (ref 0–53)
AST: 25 U/L (ref 0–37)
BILIRUBIN TOTAL: 1.1 mg/dL (ref 0.2–1.2)
Bilirubin, Direct: 0.2 mg/dL (ref 0.0–0.3)
Indirect Bilirubin: 0.9 mg/dL (ref 0.2–1.2)
Total Protein: 6.5 g/dL (ref 6.0–8.3)

## 2015-01-10 LAB — HEPATITIS C RNA QUANTITATIVE
HCV Quantitative Log: 1.7 {Log} — ABNORMAL HIGH (ref <1.18)
HCV Quantitative: 50 IU/mL — ABNORMAL HIGH (ref <15)

## 2015-01-11 ENCOUNTER — Telehealth (INDEPENDENT_AMBULATORY_CARE_PROVIDER_SITE_OTHER): Payer: Self-pay | Admitting: Internal Medicine

## 2015-01-11 NOTE — Telephone Encounter (Signed)
Bryce Martinez, I sent you a Hep C quaint for 2 weeks. Ignore it. You already have this in the computer. I added because he has not cleared the virus, but you already have this ordered.

## 2015-01-11 NOTE — Telephone Encounter (Signed)
Tammy, Needs Hep C quaint in 2 weeks.

## 2015-01-12 ENCOUNTER — Encounter (INDEPENDENT_AMBULATORY_CARE_PROVIDER_SITE_OTHER): Payer: Self-pay | Admitting: *Deleted

## 2015-01-12 ENCOUNTER — Telehealth (INDEPENDENT_AMBULATORY_CARE_PROVIDER_SITE_OTHER): Payer: Self-pay | Admitting: *Deleted

## 2015-01-12 DIAGNOSIS — B182 Chronic viral hepatitis C: Secondary | ICD-10-CM

## 2015-01-12 NOTE — Telephone Encounter (Signed)
.  Per Lelon Perla patient is to have lab in 2 weeks.

## 2015-01-12 NOTE — Telephone Encounter (Signed)
Lab noted for 01-26-15. A letter has been sent as a reminder.

## 2015-01-25 LAB — HEPATITIS C RNA QUANTITATIVE: HCV Quantitative: NOT DETECTED IU/mL (ref <15)

## 2015-01-26 ENCOUNTER — Encounter (INDEPENDENT_AMBULATORY_CARE_PROVIDER_SITE_OTHER): Payer: Self-pay | Admitting: Internal Medicine

## 2015-01-26 ENCOUNTER — Ambulatory Visit (INDEPENDENT_AMBULATORY_CARE_PROVIDER_SITE_OTHER): Payer: 59 | Admitting: Internal Medicine

## 2015-01-26 ENCOUNTER — Telehealth (INDEPENDENT_AMBULATORY_CARE_PROVIDER_SITE_OTHER): Payer: Self-pay | Admitting: *Deleted

## 2015-01-26 VITALS — BP 112/64 | HR 56 | Temp 98.0°F | Ht 73.0 in | Wt 178.0 lb

## 2015-01-26 DIAGNOSIS — B1920 Unspecified viral hepatitis C without hepatic coma: Secondary | ICD-10-CM

## 2015-01-26 DIAGNOSIS — B182 Chronic viral hepatitis C: Secondary | ICD-10-CM

## 2015-01-26 NOTE — Patient Instructions (Signed)
OV in 4 weeks.  

## 2015-01-26 NOTE — Progress Notes (Signed)
   Subjective:    Patient ID: Bryce Martinez, male    DOB: May 03, 1951, 64 y.o.   MRN: [id number]  HPI Here today for f/u of his Hepatitis C. He is genotype 2.  Patient started treatment 12/26/2014. This is week 4 for him. Risk factors razor during a hair cut.  Treated with Sovaldi and Ribavirin for 12 weeks. He says he is doing well. Working full time. No rashes. No joint pain.  He does c/o back arthritis. Appetite is good. No weight loss.  No abdominal pain. Usually has a BM daily. No melena or BRRB. No unusual tiredness.   01/23/2015 Hep C quaint undetected. H has cleared the virus. Hepatic Function Panel     Component Value Date/Time   PROT 6.5 01/09/2015 1648   ALBUMIN 4.1 01/09/2015 1648   AST 25 01/09/2015 1648   ALT 23 01/09/2015 1648   ALKPHOS 42 01/09/2015 1648   BILITOT 1.1 01/09/2015 1648   BILIDIR 0.2 01/09/2015 1648   IBILI 0.9 01/09/2015 1648    CBC    Component Value Date/Time   WBC 4.2 01/09/2015 1650   RBC 4.64 01/09/2015 1650   HGB 13.1 01/09/2015 1650   HCT 38.6* 01/09/2015 1650   PLT 237 01/09/2015 1650   MCV 83.2 01/09/2015 1650   MCH 28.2 01/09/2015 1650   MCHC 33.9 01/09/2015 1650   RDW 14.9 01/09/2015 1650   LYMPHSABS 2.1 01/09/2015 1650   MONOABS 0.3 01/09/2015 1650   EOSABS 0.1 01/09/2015 1650   BASOSABS 0.0 01/09/2015 1650             Review of Systems Past Medical History  Diagnosis Date  . ED (erectile dysfunction)   . Hypertension   . Hyperlipidemia   . Arthritis   . Allergy     Past Surgical History  Procedure Laterality Date  . Right shoulder surgery Right 03/2005    rotator cuff repair  . Appendectomy    . Hemicolectomy Right     lap hand assisted  . Cholecystectomy N/A 04/25/2014    Procedure: LAPAROSCOPIC CHOLECYSTECTOMY;  Surgeon: Jamesetta So, MD;  Location: [facility];  Service: General;  Laterality: N/A;    No Known Allergies  Current Outpatient Prescriptions on File Prior to Visit  Medication Sig  Dispense Refill  . aspirin EC 81 MG tablet Take 1 tablet (81 mg total) by mouth daily. 30 tablet 11  . tadalafil (CIALIS) 20 MG tablet Take 1 tablet (20 mg total) by mouth daily as needed for erectile dysfunction. 10 tablet 4  . triamterene-hydrochlorothiazide (MAXZIDE) 75-50 MG per tablet TAKE 1 TABLET BY MOUTH EVERY DAY 30 tablet 3   No current facility-administered medications on file prior to visit.        Objective:   Physical Exam Blood pressure 112/64, pulse 56, temperature 98 F (36.7 C), height 6\' 1"  (1.854 m), weight 178 lb (80.74 kg).  Alert and oriented. Skin warm and dry. Oral mucosa is moist.   . Sclera anicteric, conjunctivae is pink. Thyroid not enlarged. No cervical lymphadenopathy. Lungs clear. Heart regular rate and rhythm.  Abdomen is soft. Bowel sounds are positive. No hepatomegaly. No abdominal masses felt. No tenderness.  No edema to lower extremities.          Assessment & Plan:  Hepatitis C. He has cleared the virus. He is doing well.  Will repeat CBC in 2 weeks and Hep C quaint, Hepatic, and CBC in 4 weeks. OV in 4 weeks.

## 2015-01-26 NOTE — Telephone Encounter (Signed)
.  Per Bryce Martinez patient is to have CBC/D in 2 weeks, then Bryce Martinez will have CBC/d , Hepatic Profile, Hep C RNA QUANT in 4 weeks.

## 2015-02-01 ENCOUNTER — Encounter (INDEPENDENT_AMBULATORY_CARE_PROVIDER_SITE_OTHER): Payer: Self-pay | Admitting: *Deleted

## 2015-02-01 ENCOUNTER — Other Ambulatory Visit (INDEPENDENT_AMBULATORY_CARE_PROVIDER_SITE_OTHER): Payer: Self-pay | Admitting: *Deleted

## 2015-02-01 DIAGNOSIS — B182 Chronic viral hepatitis C: Secondary | ICD-10-CM

## 2015-02-11 LAB — HEPATIC FUNCTION PANEL
ALT: 23 U/L (ref 0–53)
AST: 21 U/L (ref 0–37)
Albumin: 4 g/dL (ref 3.5–5.2)
Alkaline Phosphatase: 45 U/L (ref 39–117)
Bilirubin, Direct: 0.3 mg/dL (ref 0.0–0.3)
Indirect Bilirubin: 1.7 mg/dL — ABNORMAL HIGH (ref 0.2–1.2)
TOTAL PROTEIN: 6.9 g/dL (ref 6.0–8.3)
Total Bilirubin: 2 mg/dL — ABNORMAL HIGH (ref 0.2–1.2)

## 2015-02-12 LAB — CBC WITH DIFFERENTIAL/PLATELET
Basophils Absolute: 0 10*3/uL (ref 0.0–0.1)
Basophils Relative: 1 % (ref 0–1)
EOS ABS: 0.2 10*3/uL (ref 0.0–0.7)
Eosinophils Relative: 4 % (ref 0–5)
HEMATOCRIT: 36.6 % — AB (ref 39.0–52.0)
Hemoglobin: 11.8 g/dL — ABNORMAL LOW (ref 13.0–17.0)
LYMPHS ABS: 1.4 10*3/uL (ref 0.7–4.0)
Lymphocytes Relative: 35 % (ref 12–46)
MCH: 27.2 pg (ref 26.0–34.0)
MCHC: 32.2 g/dL (ref 30.0–36.0)
MCV: 84.3 fL (ref 78.0–100.0)
MONO ABS: 0.5 10*3/uL (ref 0.1–1.0)
MPV: 9.5 fL (ref 8.6–12.4)
Monocytes Relative: 12 % (ref 3–12)
Neutro Abs: 2 10*3/uL (ref 1.7–7.7)
Neutrophils Relative %: 48 % (ref 43–77)
Platelets: 287 10*3/uL (ref 150–400)
RBC: 4.34 MIL/uL (ref 4.22–5.81)
RDW: 13.9 % (ref 11.5–15.5)
WBC: 4.1 10*3/uL (ref 4.0–10.5)

## 2015-02-14 LAB — HEPATITIS C RNA QUANTITATIVE: HCV Quantitative: NOT DETECTED IU/mL (ref <15)

## 2015-02-15 ENCOUNTER — Telehealth (INDEPENDENT_AMBULATORY_CARE_PROVIDER_SITE_OTHER): Payer: Self-pay | Admitting: Internal Medicine

## 2015-02-15 ENCOUNTER — Encounter: Payer: Self-pay | Admitting: Family Medicine

## 2015-02-15 ENCOUNTER — Ambulatory Visit (INDEPENDENT_AMBULATORY_CARE_PROVIDER_SITE_OTHER): Payer: 59 | Admitting: Family Medicine

## 2015-02-15 VITALS — BP 130/76 | HR 66 | Resp 18 | Ht 75.0 in | Wt 182.0 lb

## 2015-02-15 DIAGNOSIS — I10 Essential (primary) hypertension: Secondary | ICD-10-CM | POA: Diagnosis not present

## 2015-02-15 DIAGNOSIS — E785 Hyperlipidemia, unspecified: Secondary | ICD-10-CM | POA: Diagnosis not present

## 2015-02-15 DIAGNOSIS — R7302 Impaired glucose tolerance (oral): Secondary | ICD-10-CM | POA: Diagnosis not present

## 2015-02-15 DIAGNOSIS — Z0000 Encounter for general adult medical examination without abnormal findings: Secondary | ICD-10-CM

## 2015-02-15 DIAGNOSIS — Z1211 Encounter for screening for malignant neoplasm of colon: Secondary | ICD-10-CM

## 2015-02-15 LAB — POC HEMOCCULT BLD/STL (OFFICE/1-CARD/DIAGNOSTIC): Fecal Occult Blood, POC: NEGATIVE

## 2015-02-15 NOTE — Patient Instructions (Addendum)
F/u in 5 month, call if you need me before   Thankful that your treatment has been successful  Fasting lipid, chem 7, hBa1C end June  Pls watch fried and fatty foods and sweets, as bad cholesterol and blood sugar are slightly increased  Exam today is good  Thanks for choosing [facility], we consider it a privelige to serve you.

## 2015-02-15 NOTE — Telephone Encounter (Signed)
error 

## 2015-02-22 LAB — CBC WITH DIFFERENTIAL/PLATELET
BASOS ABS: 0 10*3/uL (ref 0.0–0.1)
BASOS PCT: 0 % (ref 0–1)
Eosinophils Absolute: 0.2 10*3/uL (ref 0.0–0.7)
Eosinophils Relative: 5 % (ref 0–5)
HCT: 35.2 % — ABNORMAL LOW (ref 39.0–52.0)
Hemoglobin: 10.9 g/dL — ABNORMAL LOW (ref 13.0–17.0)
LYMPHS PCT: 40 % (ref 12–46)
Lymphs Abs: 1.8 10*3/uL (ref 0.7–4.0)
MCH: 26.6 pg (ref 26.0–34.0)
MCHC: 31 g/dL (ref 30.0–36.0)
MCV: 85.9 fL (ref 78.0–100.0)
MPV: 9.7 fL (ref 8.6–12.4)
Monocytes Absolute: 0.5 10*3/uL (ref 0.1–1.0)
Monocytes Relative: 10 % (ref 3–12)
NEUTROS PCT: 45 % (ref 43–77)
Neutro Abs: 2 10*3/uL (ref 1.7–7.7)
Platelets: 251 10*3/uL (ref 150–400)
RBC: 4.1 MIL/uL — ABNORMAL LOW (ref 4.22–5.81)
RDW: 14.2 % (ref 11.5–15.5)
WBC: 4.5 10*3/uL (ref 4.0–10.5)

## 2015-02-23 ENCOUNTER — Encounter (INDEPENDENT_AMBULATORY_CARE_PROVIDER_SITE_OTHER): Payer: Self-pay | Admitting: Internal Medicine

## 2015-02-23 ENCOUNTER — Ambulatory Visit (INDEPENDENT_AMBULATORY_CARE_PROVIDER_SITE_OTHER): Payer: 59 | Admitting: Internal Medicine

## 2015-02-23 VITALS — BP 112/70 | HR 60 | Temp 98.6°F | Ht 73.0 in | Wt 179.4 lb

## 2015-02-23 DIAGNOSIS — B1920 Unspecified viral hepatitis C without hepatic coma: Secondary | ICD-10-CM | POA: Diagnosis not present

## 2015-02-23 NOTE — Progress Notes (Signed)
Subjective:    Patient ID: Bryce Martinez, male    DOB: [birth year], 64 y.o.   MRN: [id number]  HPI  HPI Here today for f/u of his Hepatitis C. He is genotype 2. Patient started treatment 12/26/2014. This is week 8 for him. He has 4 more weeks.  Risk factors razor during a hair cut.  Treated with Sovaldi and Ribavirin for 12 weeks. Solvaldi  Daily. and Ribavirin 400mg  BID.  He says he is doing well. Working full time.   No joint pain . Does feel tired sometimes.   Appetite is good. No weight loss.  No abdominal pain. Usually has a BM regularly. . No melena or BRRB.    02/11/2015 HCV Quaint not detected.  CBC    Component Value Date/Time   WBC 4.5 02/21/2015 1647   RBC 4.10* 02/21/2015 1647   HGB 10.9* 02/21/2015 1647   HCT 35.2* 02/21/2015 1647   PLT 251 02/21/2015 1647   MCV 85.9 02/21/2015 1647   MCH 26.6 02/21/2015 1647   MCHC 31.0 02/21/2015 1647   RDW 14.2 02/21/2015 1647   LYMPHSABS 1.8 02/21/2015 1647   MONOABS 0.5 02/21/2015 1647   EOSABS 0.2 02/21/2015 1647   BASOSABS 0.0 02/21/2015 1647         Review of Systems Past Medical History  Diagnosis Date  . ED (erectile dysfunction)   . Hypertension   . Hyperlipidemia   . Arthritis   . Allergy     Past Surgical History  Procedure Laterality Date  . Right shoulder surgery Right 03/2005    rotator cuff repair  . Appendectomy    . Hemicolectomy Right     lap hand assisted  . Cholecystectomy N/A 04/25/2014    Procedure: LAPAROSCOPIC CHOLECYSTECTOMY;  Surgeon: Jamesetta So, MD;  Location: [facility];  Service: General;  Laterality: N/A;    No Known Allergies  Current Outpatient Prescriptions on File Prior to Visit  Medication Sig Dispense Refill  . aspirin EC 81 MG tablet Take 1 tablet (81 mg total) by mouth daily. 30 tablet 11  . ribavirin (REBETOL) 200 MG capsule Take 400 mg by mouth 2 (two) times daily. Takes 400mg  BID    . sertraline (ZOLOFT) 50 MG tablet Take 50 mg by mouth daily.  0  .  Sofosbuvir 400 MG TABS Take by mouth daily.    . tadalafil (CIALIS) 20 MG tablet Take 1 tablet (20 mg total) by mouth daily as needed for erectile dysfunction. 10 tablet 4  . traZODone (DESYREL) 50 MG tablet Take 50 mg by mouth at bedtime as needed.  0  . triamterene-hydrochlorothiazide (MAXZIDE) 75-50 MG per tablet TAKE 1 TABLET BY MOUTH EVERY DAY 30 tablet 3   No current facility-administered medications on file prior to visit.        Objective:   Physical Exam Blood pressure 112/70, pulse 60, temperature 98.6 F (37 C), height 6\' 1"  (1.854 m), weight 179 lb 6.4 oz (81.375 kg). Alert and oriented. Skin warm and dry. Oral mucosa is moist.   . Sclera anicteric, conjunctivae is pink. Thyroid not enlarged. No cervical lymphadenopathy. Lungs clear. Heart regular rate and rhythm.  Abdomen is soft. Bowel sounds are positive. No hepatomegaly. No abdominal masses felt. No tenderness.  No edema to lower extremities.          Assessment & Plan:  Continue Solvaldi. Ribavirin 400mg  in Am and 200mg  in pm  Since he has had a drop in his hemoglobin.  OV  in 4 weeks.with a liver profile, CBC with diff and hep C quaint.  CBC in 2 weeks.

## 2015-02-23 NOTE — Patient Instructions (Addendum)
Continue the Solvaldi. Ribavirin 400mg  in am and 200mg  in pm.  CBC in 2 weeks.   OV in 4 weeks with a Liver profile, CBC with diff, and Hep C quaint.

## 2015-02-24 ENCOUNTER — Telehealth (INDEPENDENT_AMBULATORY_CARE_PROVIDER_SITE_OTHER): Payer: Self-pay | Admitting: *Deleted

## 2015-02-24 DIAGNOSIS — B182 Chronic viral hepatitis C: Secondary | ICD-10-CM

## 2015-02-24 NOTE — Telephone Encounter (Signed)
.  Per Bryce Martinez patient is to have lab work in 2 weeks, then other lab work in 4 weeks.

## 2015-02-26 DIAGNOSIS — Z0000 Encounter for general adult medical examination without abnormal findings: Secondary | ICD-10-CM | POA: Insufficient documentation

## 2015-02-26 NOTE — Progress Notes (Signed)
   Subjective:    Patient ID: Bryce Martinez, male    DOB: 01-17-1951, 64 y.o.   MRN: [id number]  HPI Patient is in for annual physical exam. No other health concerns are expressed or addressed at the visit. Recent labs and also consultations ,  are reviewed.Has recently completed for Hepatitis C and is being treated by mental health , through the [place] for PTSD/ depression Immunization is reviewed , and  updated if needed.    Review of Systems See HPI     Objective:   Physical Exam BP 130/76 mmHg  Pulse 66  Resp 18  Ht 6\' 3"  (1.905 m)  Wt 182 lb 0.6 oz (82.573 kg)  BMI 22.75 kg/m2  SpO2 100% Pleasant well nourished male, alert and oriented x 3, in no cardio-pulmonary distress. Afebrile. HEENT No facial trauma or asymetry. Sinuses non tender. EOMI, PERTL,  External ears normal, tympanic membranes clear. Oropharynx moist, no exudate, fairly  good dentition. Neck: supple, no adenopathy,JVD or thyromegaly.No bruits.  Chest: Clear to ascultation bilaterally.No crackles or wheezes. Non tender to palpation  Breast: No asymetry,no masses. No nipple discharge or inversion. No axillary or supraclavicular adenopathy  Cardiovascular system; Heart sounds normal,  S1 and  S2 ,no S3.  No murmur, or thrill. Apical beat not displaced Peripheral pulses normal.  Abdomen: Soft, non tender, no organomegaly or masses. No bruits. Bowel sounds normal. No guarding, tenderness or rebound.  Rectal:  Normal sphincter tone. No hemorrhoids or  masses. guaiac negative stool. Prostate smooth and firm  GU: No penile lesion or discharge. No testicular mass or tenderness.  Musculoskeletal exam: Full ROM of spine, hips , shoulders and knees. No deformity ,swelling or crepitus noted. No muscle wasting or atrophy.   Neurologic: Cranial nerves 2 to 12 intact. Power, tone ,sensation and reflexes normal throughout. No disturbance in gait. No tremor.  Skin: Intact, no ulceration,  erythema , scaling or rash noted. Pigmentation normal throughout  Psych; Normal mood and affect. Judgement and concentration normal         Assessment & Plan:  Annual physical exam Annual exam as documented. Counseling done  re healthy lifestyle involving commitment to 150 minutes exercise per week, heart healthy diet, and attaining healthy weight.The importance of adequate sleep also discussed. Regular seat belt use and home safety, is also discussed. Changes in health habits are decided on by the patient with goals and time frames  set for achieving them. Immunization and cancer screening needs are specifically addressed at this visit.

## 2015-02-26 NOTE — Assessment & Plan Note (Signed)

## 2015-02-27 ENCOUNTER — Telehealth (INDEPENDENT_AMBULATORY_CARE_PROVIDER_SITE_OTHER): Payer: Self-pay | Admitting: *Deleted

## 2015-02-27 DIAGNOSIS — B1920 Unspecified viral hepatitis C without hepatic coma: Secondary | ICD-10-CM

## 2015-02-27 LAB — CBC WITH DIFFERENTIAL/PLATELET
BASOS ABS: 0 10*3/uL (ref 0.0–0.1)
BASOS PCT: 1 % (ref 0–1)
Eosinophils Absolute: 0.1 10*3/uL (ref 0.0–0.7)
Eosinophils Relative: 2 % (ref 0–5)
HCT: 38.7 % — ABNORMAL LOW (ref 39.0–52.0)
Hemoglobin: 12.6 g/dL — ABNORMAL LOW (ref 13.0–17.0)
Lymphocytes Relative: 28 % (ref 12–46)
Lymphs Abs: 0.9 10*3/uL (ref 0.7–4.0)
MCH: 26.9 pg (ref 26.0–34.0)
MCHC: 32.6 g/dL (ref 30.0–36.0)
MCV: 82.7 fL (ref 78.0–100.0)
MONO ABS: 0.8 10*3/uL (ref 0.1–1.0)
MONOS PCT: 25 % — AB (ref 3–12)
MPV: 9.1 fL (ref 8.6–12.4)
Neutro Abs: 1.4 10*3/uL — ABNORMAL LOW (ref 1.7–7.7)
Neutrophils Relative %: 44 % (ref 43–77)
PLATELETS: 222 10*3/uL (ref 150–400)
RBC: 4.68 MIL/uL (ref 4.22–5.81)
RDW: 13.6 % (ref 11.5–15.5)
WBC: 3.2 10*3/uL — ABNORMAL LOW (ref 4.0–10.5)

## 2015-02-27 NOTE — Telephone Encounter (Signed)
Am going to get a CBC. His Ribavirin has already been cut back.

## 2015-02-27 NOTE — Telephone Encounter (Signed)
We are going in to week 9.

## 2015-02-27 NOTE — Telephone Encounter (Signed)
Would like to speak with Bryce Castle, NP. He is starting to feel worse, light headed, dizzy and no engery with his new medication. Please return his call at [phone].

## 2015-02-27 NOTE — Telephone Encounter (Signed)
Feels tired and dizzy. Will get a CBC.

## 2015-03-01 ENCOUNTER — Other Ambulatory Visit (INDEPENDENT_AMBULATORY_CARE_PROVIDER_SITE_OTHER): Payer: Self-pay | Admitting: *Deleted

## 2015-03-01 ENCOUNTER — Encounter (INDEPENDENT_AMBULATORY_CARE_PROVIDER_SITE_OTHER): Payer: Self-pay | Admitting: *Deleted

## 2015-03-01 DIAGNOSIS — B182 Chronic viral hepatitis C: Secondary | ICD-10-CM

## 2015-03-22 LAB — HEPATIC FUNCTION PANEL
ALBUMIN: 3.8 g/dL (ref 3.5–5.2)
ALT: 18 U/L (ref 0–53)
AST: 19 U/L (ref 0–37)
Alkaline Phosphatase: 44 U/L (ref 39–117)
BILIRUBIN TOTAL: 0.9 mg/dL (ref 0.2–1.2)
Bilirubin, Direct: 0.2 mg/dL (ref 0.0–0.3)
Indirect Bilirubin: 0.7 mg/dL (ref 0.2–1.2)
Total Protein: 6.8 g/dL (ref 6.0–8.3)

## 2015-03-22 LAB — CBC WITH DIFFERENTIAL/PLATELET
Basophils Absolute: 0 10*3/uL (ref 0.0–0.1)
Basophils Relative: 1 % (ref 0–1)
Eosinophils Absolute: 0.3 10*3/uL (ref 0.0–0.7)
Eosinophils Relative: 6 % — ABNORMAL HIGH (ref 0–5)
HCT: 38 % — ABNORMAL LOW (ref 39.0–52.0)
HEMOGLOBIN: 12.1 g/dL — AB (ref 13.0–17.0)
LYMPHS PCT: 45 % (ref 12–46)
Lymphs Abs: 1.9 10*3/uL (ref 0.7–4.0)
MCH: 26.8 pg (ref 26.0–34.0)
MCHC: 31.8 g/dL (ref 30.0–36.0)
MCV: 84.1 fL (ref 78.0–100.0)
MPV: 9.6 fL (ref 8.6–12.4)
Monocytes Absolute: 0.3 10*3/uL (ref 0.1–1.0)
Monocytes Relative: 8 % (ref 3–12)
NEUTROS ABS: 1.7 10*3/uL (ref 1.7–7.7)
Neutrophils Relative %: 40 % — ABNORMAL LOW (ref 43–77)
Platelets: 274 10*3/uL (ref 150–400)
RBC: 4.52 MIL/uL (ref 4.22–5.81)
RDW: 13.7 % (ref 11.5–15.5)
WBC: 4.2 10*3/uL (ref 4.0–10.5)

## 2015-03-22 LAB — HEPATITIS C RNA QUANTITATIVE: HCV Quantitative: NOT DETECTED IU/mL (ref <15)

## 2015-03-23 ENCOUNTER — Encounter (INDEPENDENT_AMBULATORY_CARE_PROVIDER_SITE_OTHER): Payer: Self-pay | Admitting: *Deleted

## 2015-03-23 ENCOUNTER — Encounter (INDEPENDENT_AMBULATORY_CARE_PROVIDER_SITE_OTHER): Payer: Self-pay | Admitting: Internal Medicine

## 2015-03-23 ENCOUNTER — Ambulatory Visit (INDEPENDENT_AMBULATORY_CARE_PROVIDER_SITE_OTHER): Payer: 59 | Admitting: Internal Medicine

## 2015-03-23 VITALS — BP 112/58 | HR 60 | Temp 98.2°F | Ht 73.0 in | Wt 179.8 lb

## 2015-03-23 DIAGNOSIS — B1920 Unspecified viral hepatitis C without hepatic coma: Secondary | ICD-10-CM

## 2015-03-23 NOTE — Patient Instructions (Addendum)
OV in 6 months with CBC with diff, Hepatic function, and Hep C quaint.  [place] RUQ in August.

## 2015-03-23 NOTE — Progress Notes (Signed)
Subjective:    Patient ID: Bryce Martinez, male    DOB: Mar 04, 1951, 64 y.o.   MRN: [id number]  HPIHere today for f/u of his Hepatitis c. Genotype 2. Started tx 12/26/2014. Week 12 for him. Risk factor for Hep C: razor for a hair cut. He finished tx 03/22/2015 Treated with Solvaldi and Ribavirin x 12 weeks.  Ribavirin cut to 400mg  am and 200mg  in pm last OV due to drop in hemoglobin.  03/21/2015 Hep C quaint undetected. He is doing well. No side effect from the [facility] or Ribavirin. Continues to work full time.  Appetite has remained good. No weight loss. No rashes, No joint pain. (weight in April 179).  No abdominal pain.  CBC    Component Value Date/Time   WBC 4.2 03/21/2015 1651   RBC 4.52 03/21/2015 1651   HGB 12.1* 03/21/2015 1651   HCT 38.0* 03/21/2015 1651   PLT 274 03/21/2015 1651   MCV 84.1 03/21/2015 1651   MCH 26.8 03/21/2015 1651   MCHC 31.8 03/21/2015 1651   RDW 13.7 03/21/2015 1651   LYMPHSABS 1.9 03/21/2015 1651   MONOABS 0.3 03/21/2015 1651   EOSABS 0.3 03/21/2015 1651   BASOSABS 0.0 03/21/2015 1651   Hepatic Function Panel     Component Value Date/Time   PROT 6.8 03/21/2015 1648   ALBUMIN 3.8 03/21/2015 1648   AST 19 03/21/2015 1648   ALT 18 03/21/2015 1648   ALKPHOS 44 03/21/2015 1648   BILITOT 0.9 03/21/2015 1648   BILIDIR 0.2 03/21/2015 1648   IBILI 0.7 03/21/2015 1648          Review of Systems Past Medical History  Diagnosis Date  . ED (erectile dysfunction)   . Hypertension   . Hyperlipidemia   . Arthritis   . Allergy     Past Surgical History  Procedure Laterality Date  . Right shoulder surgery Right 03/2005    rotator cuff repair  . Appendectomy    . Hemicolectomy Right     lap hand assisted  . Cholecystectomy N/A 04/25/2014    Procedure: LAPAROSCOPIC CHOLECYSTECTOMY;  Surgeon: Jamesetta So, MD;  Location: [facility];  Service: General;  Laterality: N/A;    No Known Allergies  Current Outpatient Prescriptions on File Prior  to Visit  Medication Sig Dispense Refill  . aspirin EC 81 MG tablet Take 1 tablet (81 mg total) by mouth daily. 30 tablet 11  . ribavirin (REBETOL) 200 MG capsule Take 400 mg by mouth 2 (two) times daily. Takes 400mg  BID    . sertraline (ZOLOFT) 50 MG tablet Take 50 mg by mouth daily.  0  . Sofosbuvir 400 MG TABS Take by mouth daily.    . tadalafil (CIALIS) 20 MG tablet Take 1 tablet (20 mg total) by mouth daily as needed for erectile dysfunction. 10 tablet 4  . traZODone (DESYREL) 50 MG tablet Take 50 mg by mouth at bedtime as needed.  0  . triamterene-hydrochlorothiazide (MAXZIDE) 75-50 MG per tablet TAKE 1 TABLET BY MOUTH EVERY DAY 30 tablet 3   No current facility-administered medications on file prior to visit.        Objective:   Physical ExamBlood pressure 112/58, pulse 60, temperature 98.2 F (36.8 C), height 6\' 1"  (1.854 m), weight 179 lb 12.8 oz (81.557 kg). Alert and oriented. Skin warm and dry. Oral mucosa is moist.   . Sclera anicteric, conjunctivae is pink. Thyroid not enlarged. No cervical lymphadenopathy. Lungs clear. Heart regular rate and rhythm.  Abdomen is soft. Bowel sounds are positive. No hepatomegaly. No abdominal masses felt. No tenderness.  No edema to lower extremities.          Assessment & Plan:  Hepatitis C. He has cleared the virus. He is doing well. Finished tx 03/22/2015 OV in 6 months with a CBC, Hepatic function, and Hep C quaint.  Recall for [place] RUQ in August.

## 2015-05-14 ENCOUNTER — Other Ambulatory Visit: Payer: Self-pay | Admitting: Family Medicine

## 2015-05-21 LAB — LIPID PANEL
CHOL/HDL RATIO: 3 ratio
Cholesterol: 195 mg/dL (ref 0–200)
HDL: 66 mg/dL (ref >=40)
LDL Cholesterol: 119 mg/dL — ABNORMAL HIGH (ref 0–99)
TRIGLYCERIDES: 52 mg/dL (ref <150)
VLDL: 10 mg/dL (ref 0–40)

## 2015-05-21 LAB — BASIC METABOLIC PANEL
BUN: 14 mg/dL (ref 6–23)
CALCIUM: 9.4 mg/dL (ref 8.4–10.5)
CO2: 31 mEq/L (ref 19–32)
Chloride: 102 mEq/L (ref 96–112)
Creat: 1.07 mg/dL (ref 0.50–1.35)
GLUCOSE: 87 mg/dL (ref 70–99)
Potassium: 3.9 mEq/L (ref 3.5–5.3)
Sodium: 141 mEq/L (ref 135–145)

## 2015-05-21 LAB — HEMOGLOBIN A1C
Hgb A1c MFr Bld: 5.8 % — ABNORMAL HIGH (ref <5.7)
MEAN PLASMA GLUCOSE: 120 mg/dL — AB (ref <117)

## 2015-06-12 ENCOUNTER — Encounter (INDEPENDENT_AMBULATORY_CARE_PROVIDER_SITE_OTHER): Payer: Self-pay | Admitting: *Deleted

## 2015-07-10 ENCOUNTER — Emergency Department (HOSPITAL_COMMUNITY): Payer: 59

## 2015-07-10 ENCOUNTER — Emergency Department (HOSPITAL_COMMUNITY)
Admission: EM | Admit: 2015-07-10 | Discharge: 2015-07-11 | Disposition: A | Discharge status: Home / Self Care | Payer: 59 | Attending: Emergency Medicine | Admitting: Emergency Medicine

## 2015-07-10 DIAGNOSIS — Z8619 Personal history of other infectious and parasitic diseases: Secondary | ICD-10-CM | POA: Insufficient documentation

## 2015-07-10 DIAGNOSIS — M545 Low back pain: Secondary | ICD-10-CM | POA: Diagnosis present

## 2015-07-10 DIAGNOSIS — I10 Essential (primary) hypertension: Secondary | ICD-10-CM | POA: Insufficient documentation

## 2015-07-10 DIAGNOSIS — M5441 Lumbago with sciatica, right side: Secondary | ICD-10-CM | POA: Insufficient documentation

## 2015-07-10 DIAGNOSIS — Z87891 Personal history of nicotine dependence: Secondary | ICD-10-CM | POA: Diagnosis not present

## 2015-07-10 DIAGNOSIS — Z87438 Personal history of other diseases of male genital organs: Secondary | ICD-10-CM | POA: Insufficient documentation

## 2015-07-10 DIAGNOSIS — Z8639 Personal history of other endocrine, nutritional and metabolic disease: Secondary | ICD-10-CM | POA: Insufficient documentation

## 2015-07-10 DIAGNOSIS — M1990 Unspecified osteoarthritis, unspecified site: Secondary | ICD-10-CM | POA: Insufficient documentation

## 2015-07-10 DIAGNOSIS — Z79899 Other long term (current) drug therapy: Secondary | ICD-10-CM | POA: Diagnosis not present

## 2015-07-10 DIAGNOSIS — M79673 Pain in unspecified foot: Secondary | ICD-10-CM

## 2015-07-10 DIAGNOSIS — G8929 Other chronic pain: Secondary | ICD-10-CM | POA: Insufficient documentation

## 2015-07-10 NOTE — ED Notes (Signed)
Numbness to right foot.  Began earlier today.  Dull and sore,  Pt states he noticed the feelling while at a cook out about 8 pm. No pain or numbness any where else, and no injury that pt is aware of.

## 2015-07-11 LAB — CBG MONITORING, ED: Glucose-Capillary: 102 mg/dL — ABNORMAL HIGH (ref 65–99)

## 2015-07-11 MED ORDER — METHOCARBAMOL 500 MG PO TABS: 1000.0000 mg | ORAL_TABLET | Freq: Four times a day (QID) | ORAL | Status: DC

## 2015-07-11 MED ORDER — PREDNISONE 10 MG PO TABS: ORAL_TABLET | ORAL | Status: DC

## 2015-07-11 NOTE — ED Provider Notes (Signed)
CSN: [id number]     Arrival date & time 07/10/15  2041 History   First MD Initiated Contact with Patient 07/10/15 2345      (Consider location/radiation/quality/duration/timing/severity/associated sxs/prior Treatment) The history is provided by the patient.   Bryce Martinez is a 64 y.o. male with a past medical history of HTN, Hyperlipidemia, hepatitis C and worsening chronic right lower back pain presenting with numbness without weakness in his right foot and his right lateral calf  which has been present intermittently for the past couple of weeks.  He denies any injury to the foot.  He has chronic low back pain with known arthritis and a leftward curvature per prior xrays.  He states his back pain has become more constant and daily, denies injury or fall.  He has had no weakness in the legs, no urinary or bowel incontinence.  He has had no medicines for his sx and has found no alleviators.     Past Medical History  Diagnosis Date  . ED (erectile dysfunction)   . Hypertension   . Hyperlipidemia   . Arthritis   . Allergy   . Hepatitis C    Past Surgical History  Procedure Laterality Date  . Right shoulder surgery Right 03/2005    rotator cuff repair  . Appendectomy    . Hemicolectomy Right     lap hand assisted  . Cholecystectomy N/A 04/25/2014    Procedure: LAPAROSCOPIC CHOLECYSTECTOMY;  Surgeon: Jamesetta So, MD;  Location: [facility];  Service: General;  Laterality: N/A;   Family History  Problem Relation Age of Onset  . Hypertension Mother   . Hypertension Father   . Hypertension Sister   . Hypertension Brother   . Hypertension Daughter   . Hypertension Son    Social History  Substance Use Topics  . Smoking status: Former Smoker -- 0.25 packs/day for 15 years    Types: Cigarettes    Quit date: 04/19/1972  . Smokeless tobacco: Not on file  . Alcohol Use: No    Review of Systems  Constitutional: Negative for fever.  HENT: Negative.   Respiratory: Negative for  shortness of breath.   Cardiovascular: Negative for chest pain and leg swelling.  Gastrointestinal: Negative for nausea, abdominal pain, constipation and abdominal distention.  Genitourinary: Negative.  Negative for dysuria, urgency, frequency, flank pain and difficulty urinating.  Musculoskeletal: Positive for back pain. Negative for joint swelling, arthralgias, gait problem and neck pain.  Skin: Negative.  Negative for rash and wound.  Neurological: Positive for numbness. Negative for dizziness, facial asymmetry, speech difficulty, weakness, light-headedness and headaches.  Psychiatric/Behavioral: Negative.       Allergies  Review of patient's allergies indicates no known allergies.  Home Medications   Prior to Admission medications   Medication Sig Start Date End Date Taking? Authorizing Provider  triamterene-hydrochlorothiazide (MAXZIDE) 75-50 MG per tablet TAKE 1 TABLET BY MOUTH EVERY DAY 05/15/15  Yes Fayrene Helper, MD  methocarbamol (ROBAXIN) 500 MG tablet Take 2 tablets (1,000 mg total) by mouth 4 (four) times daily. 07/11/15 07/21/15  Evalee Jefferson, PA-C  predniSONE (DELTASONE) 10 MG tablet 6, 5, 4, 3, 2 then 1 tablet by mouth daily for 6 days total. 07/11/15   Evalee Jefferson, PA-C  tadalafil (CIALIS) 20 MG tablet Take 1 tablet (20 mg total) by mouth daily as needed for erectile dysfunction. 08/03/14   Fayrene Helper, MD   BP 151/85 mmHg  Pulse 72  Temp(Src) 98.2 F (36.8 C) (Oral)  Resp 20  Ht 6\' 1"  (1.854 m)  Wt 189 lb (85.73 kg)  BMI 24.94 kg/m2  SpO2 99% Physical Exam  Constitutional: He is oriented to person, place, and time. He appears well-developed and well-nourished.  HENT:  Head: Normocephalic.  Eyes: Conjunctivae are normal.  Neck: Normal range of motion. Neck supple.  Cardiovascular: Normal rate and intact distal pulses.   Pulses:      Dorsalis pedis pulses are 2+ on the right side, and 2+ on the left side.  Pedal pulses normal.  Pulmonary/Chest: Effort  normal.  Abdominal: Soft. Bowel sounds are normal. He exhibits no distension and no mass.  Musculoskeletal: Normal range of motion. He exhibits no edema.       Lumbar back: He exhibits tenderness. He exhibits no swelling, no edema and no spasm.  ttp right paralumbar musculature.  No muscle spasm appreciated. Calf nontender, no edema.  Neurological: He is alert and oriented to person, place, and time. He has normal strength. He displays no atrophy and no tremor. A sensory deficit is present. No cranial nerve deficit. Coordination and gait normal.  Reflex Scores:      Patellar reflexes are 2+ on the right side and 2+ on the left side.      Achilles reflexes are 2+ on the right side and 2+ on the left side. No strength deficit noted in hip and knee flexor and extensor muscle groups.  Ankle flexion and extension intact.  Decreased sensation to fine touch right lateral dorsal foot.    Skin: Skin is warm and dry.  Psychiatric: He has a normal mood and affect.  Nursing note and vitals reviewed.   ED Course  Procedures (including critical care time) Labs Review Labs Reviewed  CBG MONITORING, ED - Abnormal; Notable for the following:    Glucose-Capillary 102 (*)    All other components within normal limits    Imaging Review Dg Foot Complete Right  07/10/2015   CLINICAL DATA:  Initial valuation for acute right foot pain.  EXAM: RIGHT FOOT COMPLETE - 3+ VIEW  COMPARISON:  None.  FINDINGS: No acute fracture dislocation. Joint spaces maintained. Minimal degenerative changes present about the first MTP joint. Tiny posterior calcaneal enthesophyte noted. No other significant degenerative changes. Osseous mineralization normal. No soft tissue abnormality.  IMPRESSION: No acute abnormality about the right foot.   Electronically Signed   By: Jeannine Boga M.D.   On: 07/10/2015 23:51   I have personally reviewed and evaluated these images and lab results as part of my medical decision-making.   EKG  Interpretation None      MDM   Final diagnoses:  Right-sided low back pain with right-sided sciatica    Exam and history c/w peripheral neuropathic pain, possibly related to his worsening right lower back pain.  No red flag sx suggesting emergent presentation. Doubt cauda equina.  No lower extremity weakness.  Gait normal, no foot drop.  Pt was encouraged to f/u with his pcp - has appt next week. He was placed on prednisone taper, robaxin prescribed, advised heat tx. To lower back.  No neuro deficit on exam or by history to suggest emergent or surgical presentation.  Also discussed worsened sx that should prompt immediate re-evaluation including distal weakness, bowel/bladder retention/incontinence.          Evalee Jefferson, PA-C 07/11/15 Irma Newness, MD 07/11/15 [phone]

## 2015-07-11 NOTE — Discharge Instructions (Signed)
Sciatica Sciatica is pain, weakness, numbness, or tingling along the path of the sciatic nerve. The nerve starts in the lower back and runs down the back of each leg. The nerve controls the muscles in the lower leg and in the back of the knee, while also providing sensation to the back of the thigh, lower leg, and the sole of your foot. Sciatica is a symptom of another medical condition. For instance, nerve damage or certain conditions, such as a herniated disk or bone spur on the spine, pinch or put pressure on the sciatic nerve. This causes the pain, weakness, or other sensations normally associated with sciatica. Generally, sciatica only affects one side of the body. CAUSES   Herniated or slipped disc.  Degenerative disk disease.  A pain disorder involving the narrow muscle in the buttocks (piriformis syndrome).  Pelvic injury or fracture.  Pregnancy.  Tumor (rare). SYMPTOMS  Symptoms can vary from mild to very severe. The symptoms usually travel from the low back to the buttocks and down the back of the leg. Symptoms can include:  Mild tingling or dull aches in the lower back, leg, or hip.  Numbness in the back of the calf or sole of the foot.  Burning sensations in the lower back, leg, or hip.  Sharp pains in the lower back, leg, or hip.  Leg weakness.  Severe back pain inhibiting movement. These symptoms may get worse with coughing, sneezing, laughing, or prolonged sitting or standing. Also, being overweight may worsen symptoms. DIAGNOSIS  Your caregiver will perform a physical exam to look for common symptoms of sciatica. He or she may ask you to do certain movements or activities that would trigger sciatic nerve pain. Other tests may be performed to find the cause of the sciatica. These may include:  Blood tests.  X-rays.  Imaging tests, such as an MRI or CT scan. TREATMENT  Treatment is directed at the cause of the sciatic pain. Sometimes, treatment is not necessary  and the pain and discomfort goes away on its own. If treatment is needed, your caregiver may suggest:  Over-the-counter medicines to relieve pain.  Prescription medicines, such as anti-inflammatory medicine, muscle relaxants, or narcotics.  Applying heat or ice to the painful area.  Steroid injections to lessen pain, irritation, and inflammation around the nerve.  Reducing activity during periods of pain.  Exercising and stretching to strengthen your abdomen and improve flexibility of your spine. Your caregiver may suggest losing weight if the extra weight makes the back pain worse.  Physical therapy.  Surgery to eliminate what is pressing or pinching the nerve, such as a bone spur or part of a herniated disk. HOME CARE INSTRUCTIONS   Only take over-the-counter or prescription medicines for pain or discomfort as directed by your caregiver.  Apply ice to the affected area for 20 minutes, 3-4 times a day for the first 48-72 hours. Then try heat in the same way.  Exercise, stretch, or perform your usual activities if these do not aggravate your pain.  Attend physical therapy sessions as directed by your caregiver.  Keep all follow-up appointments as directed by your caregiver.  Do not wear high heels or shoes that do not provide proper support.  Check your mattress to see if it is too soft. A firm mattress may lessen your pain and discomfort. SEEK IMMEDIATE MEDICAL CARE IF:   You lose control of your bowel or bladder (incontinence).  You have increasing weakness in the lower back, pelvis, buttocks,   or legs.  You have redness or swelling of your back.  You have a burning sensation when you urinate.  You have pain that gets worse when you lie down or awakens you at night.  Your pain is worse than you have experienced in the past.  Your pain is lasting longer than 4 weeks.  You are suddenly losing weight without reason. MAKE SURE YOU:  Understand these  instructions.  Will watch your condition.  Will get help right away if you are not doing well or get worse. Document Released: 10/15/2001 Document Revised: 04/21/2012 Document Reviewed: 03/01/2012 ExitCare Patient Information 2015 [employer]. This information is not intended to replace advice given to you by your health care provider. Make sure you discuss any questions you have with your health care provider.  

## 2015-07-18 ENCOUNTER — Encounter: Payer: Self-pay | Admitting: Family Medicine

## 2015-07-18 ENCOUNTER — Ambulatory Visit (INDEPENDENT_AMBULATORY_CARE_PROVIDER_SITE_OTHER): Payer: 59 | Admitting: Family Medicine

## 2015-07-18 VITALS — BP 134/72 | HR 62 | Resp 18 | Ht 75.0 in | Wt 178.0 lb

## 2015-07-18 DIAGNOSIS — I10 Essential (primary) hypertension: Secondary | ICD-10-CM

## 2015-07-18 DIAGNOSIS — Z114 Encounter for screening for human immunodeficiency virus [HIV]: Secondary | ICD-10-CM | POA: Diagnosis not present

## 2015-07-18 DIAGNOSIS — M5441 Lumbago with sciatica, right side: Secondary | ICD-10-CM

## 2015-07-18 DIAGNOSIS — R7302 Impaired glucose tolerance (oral): Secondary | ICD-10-CM | POA: Diagnosis not present

## 2015-07-18 DIAGNOSIS — Z634 Disappearance and death of family member: Secondary | ICD-10-CM

## 2015-07-18 DIAGNOSIS — F4321 Adjustment disorder with depressed mood: Secondary | ICD-10-CM

## 2015-07-18 DIAGNOSIS — Z125 Encounter for screening for malignant neoplasm of prostate: Secondary | ICD-10-CM | POA: Diagnosis not present

## 2015-07-18 DIAGNOSIS — J3089 Other allergic rhinitis: Secondary | ICD-10-CM

## 2015-07-18 DIAGNOSIS — E785 Hyperlipidemia, unspecified: Secondary | ICD-10-CM

## 2015-07-18 DIAGNOSIS — N529 Male erectile dysfunction, unspecified: Secondary | ICD-10-CM

## 2015-07-18 MED ORDER — GABAPENTIN 300 MG PO CAPS: 300.0000 mg | ORAL_CAPSULE | Freq: Three times a day (TID) | ORAL | Status: DC

## 2015-07-18 NOTE — Assessment & Plan Note (Signed)
No current or recent flare, on no medication in past 4 months

## 2015-07-18 NOTE — Assessment & Plan Note (Signed)
Patient educated about the importance of limiting  Carbohydrate intake , the need to commit to daily physical activity for a minimum of 30 minutes , and to commit weight loss. The fact that changes in all these areas will reduce or eliminate all together the development of diabetes is stressed.  Deteriorated will continue to follow  Diabetic Labs Latest Ref Rng 05/20/2015 10/22/2014 04/19/2014 04/02/2014 09/24/2013  HbA1c <5.7 % 5.8(H) 5.7(H) - 5.9(H) -  Chol 0 - 200 mg/dL 195 186 - 199 -  HDL >=40 mg/dL 66 55 - 52 -  Calc LDL 0 - 99 mg/dL 119(H) 111(H) - 132(H) -  Triglycerides <150 mg/dL 52 100 - 77 -  Creatinine 0.50 - 1.35 mg/dL 1.07 1.11 1.34 1.21 1.11   BP/Weight 07/18/2015 07/11/2015 07/10/2015 03/23/2015 02/23/2015 02/15/2015 1/63/8453  Systolic BP 646 803 - 212 248 250 037  Diastolic BP 72 85 - 58 70 76 64  Wt. (Lbs) 178 - 189 179.8 179.4 182.04 178  BMI 22.25 - 24.94 23.73 23.67 22.75 23.49   No flowsheet data found.

## 2015-07-18 NOTE — Patient Instructions (Signed)
F/u in 5.5 month, call if you need me before  Non fast labs 12/21 or after include your prostate screen  For sciatic nerve pain/ back pain, gabapentin 300mg  one at bedtime will  Be hel[pful  Continue regular exercise and reduce fatty food in diet  Aim for 7 hours sleep daily,   Thanks for choosing [facility], we consider it a privelige to serve you.

## 2015-07-18 NOTE — Assessment & Plan Note (Signed)
Appears to be coping well 4 months post event, states wife but not he went to  Grief counseling , feels as though he is "doing well"and has a good support system

## 2015-07-18 NOTE — Progress Notes (Signed)
Bryce Martinez     MRN: [id number]      DOB: June 06, 1951   HPI Mr. Fontanilla is here for follow up and re-evaluation of chronic medical conditions, medication management and review of any available recent lab and radiology data.  Preventive health is updated, specifically  Cancer screening and Immunization.  Will get flu vac at work Seen recently in [place] due right foot and ankle numbness and discomfort, xray normal pain neuropathic from spine, was prescribed prednisone which  He has not yet filled, states he has had he pain for years The PT denies any adverse reactions to current medications since the last visit.  Unexpectedly lost his only child at the age of 69 less than 5 months ago, overall seems to be doing as well as expected ROS Denies recent fever or chills. Denies sinus pressure, nasal congestion, ear pain or sore throat. Denies chest congestion, productive cough or wheezing. Denies chest pains, palpitations and leg swelling Denies abdominal pain, nausea, vomiting,diarrhea or constipation.   Denies dysuria, frequency, hesitancy or incontinence.  Denies headaches, seizures, numbness, or tingling. Denies depression, anxiety or insomnia.Grieving unexpected loss of his only child a 4 y/o son, appears to be handling the situation, he has 2 grandchildren and they are cared for by their Mom who pt states is an excellent Mom Denies skin break down or rash.   PE  BP 134/72 mmHg  Pulse 62  Resp 18  Ht 6\' 3"  (1.905 m)  Wt 178 lb (80.74 kg)  BMI 22.25 kg/m2  SpO2 99%  Patient alert and oriented and in no cardiopulmonary distress.  HEENT: No facial asymmetry, EOMI,   oropharynx pink and moist.  Neck supple no JVD, no mass.  Chest: Clear to auscultation bilaterally.  CVS: S1, S2 no murmurs, no S3.Regular rate.  ABD: Soft non tender.   Ext: No edema  MS: Adequate ROM spine, shoulders, hips and knees.  Skin: Intact, no ulcerations or rash noted.  Psych: Good eye contact,  normal affect. Memory intact not anxious or depressed appearing.  CNS: CN 2-12 intact, power,  normal throughout.no focal deficits noted.   Assessment & Plan   Essential hypertension Controlled, no change in medication DASH diet and commitment to daily physical activity for a minimum of 30 minutes discussed and encouraged, as a part of hypertension management. The importance of attaining a healthy weight is also discussed.  BP/Weight 07/18/2015 07/11/2015 07/10/2015 03/23/2015 02/23/2015 02/15/2015 6/73/4193  Systolic BP 790 240 - 973 532 992 426  Diastolic BP 72 85 - 58 70 76 64  Wt. (Lbs) 178 - 189 179.8 179.4 182.04 178  BMI 22.25 - 24.94 23.73 23.67 22.75 23.49        IGT (impaired glucose tolerance) Patient educated about the importance of limiting  Carbohydrate intake , the need to commit to daily physical activity for a minimum of 30 minutes , and to commit weight loss. The fact that changes in all these areas will reduce or eliminate all together the development of diabetes is stressed.  Deteriorated will continue to follow  Diabetic Labs Latest Ref Rng 05/20/2015 10/22/2014 04/19/2014 04/02/2014 09/24/2013  HbA1c <5.7 % 5.8(H) 5.7(H) - 5.9(H) -  Chol 0 - 200 mg/dL 195 186 - 199 -  HDL >=40 mg/dL 66 55 - 52 -  Calc LDL 0 - 99 mg/dL 119(H) 111(H) - 132(H) -  Triglycerides <150 mg/dL 52 100 - 77 -  Creatinine 0.50 - 1.35 mg/dL 1.07 1.11 1.34 1.21 1.11  BP/Weight 07/18/2015 07/11/2015 07/10/2015 03/23/2015 02/23/2015 02/15/2015 0/14/1030  Systolic BP 131 438 - 887 579 728 206  Diastolic BP 72 85 - 58 70 76 64  Wt. (Lbs) 178 - 189 179.8 179.4 182.04 178  BMI 22.25 - 24.94 23.73 23.67 22.75 23.49   No flowsheet data found.     Back pain Recent flare requiring ED visit,radiates to right foot, has not filled prednisone prescribed. Educated pt re underlying pathology, and will start a trial of gabapentin  Dyslipidemia Hyperlipidemia:Low fat diet discussed and encouraged.   Lipid  Panel  Lab Results  Component Value Date   CHOL 195 05/20/2015   HDL 66 05/20/2015   LDLCALC 119* 05/20/2015   TRIG 52 05/20/2015   CHOLHDL 3.0 05/20/2015  re educated re  Need to lower fried and fatty food intake      ED (erectile dysfunction) As needed cialis effective, continue same  Allergic rhinitis No current or recent flare, on no medication in past 4 months  Grief at loss of child Appears to be coping well 4 months post event, states wife but not he went to  Grief counseling , feels as though he is "doing well"and has a good support system

## 2015-07-18 NOTE — Progress Notes (Signed)
   Subjective:    Patient ID: Bryce Martinez, male    DOB: 14-Mar-1951, 64 y.o.   MRN: [id number]  HPI    Review of Systems     Objective:   Physical Exam        Assessment & Plan:

## 2015-07-18 NOTE — Assessment & Plan Note (Signed)
Hyperlipidemia:Low fat diet discussed and encouraged.   Lipid Panel  Lab Results  Component Value Date   CHOL 195 05/20/2015   HDL 66 05/20/2015   LDLCALC 119* 05/20/2015   TRIG 52 05/20/2015   CHOLHDL 3.0 05/20/2015  re educated re  Need to lower fried and fatty food intake

## 2015-07-18 NOTE — Assessment & Plan Note (Signed)
Recent flare requiring ED visit,radiates to right foot, has not filled prednisone prescribed. Educated pt re underlying pathology, and will start a trial of gabapentin

## 2015-07-18 NOTE — Assessment & Plan Note (Signed)
As needed cialis effective, continue same

## 2015-07-18 NOTE — Assessment & Plan Note (Signed)
Controlled, no change in medication DASH diet and commitment to daily physical activity for a minimum of 30 minutes discussed and encouraged, as a part of hypertension management. The importance of attaining a healthy weight is also discussed.  BP/Weight 07/18/2015 07/11/2015 07/10/2015 03/23/2015 02/23/2015 02/15/2015 1/91/4782  Systolic BP 956 213 - 086 578 469 629  Diastolic BP 72 85 - 58 70 76 64  Wt. (Lbs) 178 - 189 179.8 179.4 182.04 178  BMI 22.25 - 24.94 23.73 23.67 22.75 23.49

## 2015-07-26 ENCOUNTER — Telehealth (INDEPENDENT_AMBULATORY_CARE_PROVIDER_SITE_OTHER): Payer: Self-pay | Admitting: Internal Medicine

## 2015-07-26 ENCOUNTER — Other Ambulatory Visit (INDEPENDENT_AMBULATORY_CARE_PROVIDER_SITE_OTHER): Payer: Self-pay | Admitting: Internal Medicine

## 2015-07-26 DIAGNOSIS — B1921 Unspecified viral hepatitis C with hepatic coma: Secondary | ICD-10-CM

## 2015-07-27 NOTE — Telephone Encounter (Signed)
US ordered

## 2015-07-28 ENCOUNTER — Ambulatory Visit (HOSPITAL_COMMUNITY)
Admission: RE | Admit: 2015-07-28 | Discharge: 2015-07-28 | Disposition: A | Discharge status: Home / Self Care | Payer: 59 | Source: Ambulatory Visit | Attending: Internal Medicine | Admitting: Internal Medicine

## 2015-07-28 DIAGNOSIS — B1920 Unspecified viral hepatitis C without hepatic coma: Secondary | ICD-10-CM | POA: Insufficient documentation

## 2015-07-28 DIAGNOSIS — B1921 Unspecified viral hepatitis C with hepatic coma: Secondary | ICD-10-CM

## 2015-08-02 ENCOUNTER — Other Ambulatory Visit (HOSPITAL_COMMUNITY): Payer: 59

## 2015-09-25 ENCOUNTER — Ambulatory Visit (INDEPENDENT_AMBULATORY_CARE_PROVIDER_SITE_OTHER): Payer: 59 | Admitting: Internal Medicine

## 2015-10-10 ENCOUNTER — Other Ambulatory Visit: Payer: Self-pay | Admitting: Family Medicine

## 2015-10-17 ENCOUNTER — Encounter (INDEPENDENT_AMBULATORY_CARE_PROVIDER_SITE_OTHER): Payer: Self-pay | Admitting: Internal Medicine

## 2015-10-17 ENCOUNTER — Telehealth (INDEPENDENT_AMBULATORY_CARE_PROVIDER_SITE_OTHER): Payer: Self-pay | Admitting: *Deleted

## 2015-10-17 ENCOUNTER — Ambulatory Visit (INDEPENDENT_AMBULATORY_CARE_PROVIDER_SITE_OTHER): Payer: 59 | Admitting: Internal Medicine

## 2015-10-17 VITALS — BP 144/88 | HR 60 | Temp 98.2°F | Resp 18 | Ht 73.0 in | Wt 183.3 lb

## 2015-10-17 DIAGNOSIS — Z8619 Personal history of other infectious and parasitic diseases: Secondary | ICD-10-CM

## 2015-10-17 DIAGNOSIS — D649 Anemia, unspecified: Secondary | ICD-10-CM | POA: Diagnosis not present

## 2015-10-17 DIAGNOSIS — B1710 Acute hepatitis C without hepatic coma: Secondary | ICD-10-CM

## 2015-10-17 NOTE — Telephone Encounter (Signed)
Per Dr.Rehman the patient will need to have labs drawn. 

## 2015-10-17 NOTE — Patient Instructions (Signed)
Physician will call with results of blood tests when completed. 

## 2015-10-17 NOTE — Progress Notes (Signed)
Presenting complaint;  Follow-up for chronic hepatitis C.  Subjective:  Patient is 64 year old African-American male who has chronic hepatitis C genotype II who was treated with 12 weeks of Sofosbuvir and ribavirin. He developed anemia but cleared the virus. He has no complaints. He has very good appetite. She denies abdominal pain. Bowels move once or twice a week which is his usual pattern. He denies melena or rectal bleeding. He believes last colonoscopy was 6 or 7 years ago. 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   Current Medications: Outpatient Encounter Prescriptions as of 10/17/2015  Medication Sig  . tadalafil (CIALIS) 20 MG tablet Take 1 tablet (20 mg total) by mouth daily as needed for erectile dysfunction.  . triamterene-hydrochlorothiazide (MAXZIDE) 75-50 MG tablet TAKE 1 TABLET BY MOUTH EVERY DAY  . [DISCONTINUED] gabapentin (NEURONTIN) 300 MG capsule Take 1 capsule (300 mg total) by mouth 3 (three) times daily. (Patient not taking: Reported on 10/17/2015)   No facility-administered encounter medications on file as of 10/17/2015.     Objective: Blood pressure 144/88, pulse 60, temperature 98.2 F (36.8 C), temperature source Oral, resp. rate 18, height 6\' 1"  (1.854 m), weight 183 lb 4.8 oz (83.144 kg). Patient is alert and in no acute distress. Conjunctiva is pink. Sclera is nonicteric Oropharyngeal mucosa is normal. No neck masses or thyromegaly noted. Cardiac exam with regular rhythm normal S1 and S2. No murmur or gallop noted. Lungs are clear to auscultation. Abdomen is symmetrical soft and nontender. No organomegaly or masses noted. No LE edema or clubbing noted.  Labs/studies Results: Ultrasound of 07/28/2015  Prior cholecystectomy. No abnormality noted to liver. bile duct 2 mm.   H&H 12.1 and 38.0 on 03/21/2015.  Assessment:  #1.  History of hepatitis C genotype II,stage F2/F3 disease. He completed 12 weeks of sofosbuvir and ribavirin and HCVRNA was undetectable at end of treatment. Follow-up test is needed to document sustained virologic response. Since he has F2/F3 he may have to be screened periodically for [facility] unless follow-up study reveals progression of fibrosis. #2. Mild anemia most likely secondary to ribavirin. H&H should be back to normal by now.   Plan:  Patient will go to the lab for CBC, LFTs and HCVRNA by his PCR quantitative. Review prior colonoscopy records to make sure he is up-to-date as to screening for CRC. Office visit in one year.

## 2015-10-18 LAB — HEPATITIS C RNA QUANTITATIVE: HCV QUANT: NOT DETECTED [IU]/mL (ref <15)

## 2015-10-18 LAB — HEPATIC FUNCTION PANEL
ALT: 21 U/L (ref 9–46)
AST: 23 U/L (ref 10–35)
Albumin: 3.9 g/dL (ref 3.6–5.1)
Alkaline Phosphatase: 41 U/L (ref 40–115)
BILIRUBIN DIRECT: 0.2 mg/dL (ref <=0.2)
BILIRUBIN INDIRECT: 0.7 mg/dL (ref 0.2–1.2)
TOTAL PROTEIN: 6.8 g/dL (ref 6.1–8.1)
Total Bilirubin: 0.9 mg/dL (ref 0.2–1.2)

## 2015-10-18 LAB — CBC
HCT: 39.4 % (ref 39.0–52.0)
Hemoglobin: 13.7 g/dL (ref 13.0–17.0)
MCH: 28.7 pg (ref 26.0–34.0)
MCHC: 34.8 g/dL (ref 30.0–36.0)
MCV: 82.4 fL (ref 78.0–100.0)
MPV: 10.1 fL (ref 8.6–12.4)
PLATELETS: 226 10*3/uL (ref 150–400)
RBC: 4.78 MIL/uL (ref 4.22–5.81)
RDW: 13.9 % (ref 11.5–15.5)
WBC: 5.2 10*3/uL (ref 4.0–10.5)

## 2015-12-20 LAB — BASIC METABOLIC PANEL
BUN: 14 mg/dL (ref 7–25)
CALCIUM: 9.1 mg/dL (ref 8.6–10.3)
CO2: 26 mmol/L (ref 20–31)
Chloride: 101 mmol/L (ref 98–110)
Creat: 1.1 mg/dL (ref 0.70–1.25)
GLUCOSE: 96 mg/dL (ref 65–99)
Potassium: 3.8 mmol/L (ref 3.5–5.3)
Sodium: 140 mmol/L (ref 135–146)

## 2015-12-20 LAB — HIV ANTIBODY (ROUTINE TESTING W REFLEX): HIV 1&2 Ab, 4th Generation: NONREACTIVE

## 2015-12-20 LAB — TSH: TSH: 1.93 mIU/L (ref 0.40–4.50)

## 2015-12-20 LAB — PSA: PSA: 0.88 ng/mL (ref <=4.00)

## 2016-01-02 ENCOUNTER — Ambulatory Visit (INDEPENDENT_AMBULATORY_CARE_PROVIDER_SITE_OTHER): Payer: 59 | Admitting: Family Medicine

## 2016-01-02 ENCOUNTER — Encounter: Payer: Self-pay | Admitting: Family Medicine

## 2016-01-02 VITALS — BP 124/80 | HR 80 | Resp 18 | Wt 182.0 lb

## 2016-01-02 DIAGNOSIS — M542 Cervicalgia: Secondary | ICD-10-CM | POA: Diagnosis not present

## 2016-01-02 DIAGNOSIS — M5442 Lumbago with sciatica, left side: Secondary | ICD-10-CM

## 2016-01-02 DIAGNOSIS — N529 Male erectile dysfunction, unspecified: Secondary | ICD-10-CM | POA: Diagnosis not present

## 2016-01-02 DIAGNOSIS — E785 Hyperlipidemia, unspecified: Secondary | ICD-10-CM

## 2016-01-02 DIAGNOSIS — G8929 Other chronic pain: Secondary | ICD-10-CM

## 2016-01-02 DIAGNOSIS — I10 Essential (primary) hypertension: Secondary | ICD-10-CM | POA: Diagnosis not present

## 2016-01-02 DIAGNOSIS — R7302 Impaired glucose tolerance (oral): Secondary | ICD-10-CM

## 2016-01-02 DIAGNOSIS — F4321 Adjustment disorder with depressed mood: Secondary | ICD-10-CM

## 2016-01-02 DIAGNOSIS — Z634 Disappearance and death of family member: Secondary | ICD-10-CM

## 2016-01-02 MED ORDER — GABAPENTIN 100 MG PO CAPS: 100.0000 mg | ORAL_CAPSULE | Freq: Every day | ORAL | Status: DC

## 2016-01-02 MED ORDER — TADALAFIL 20 MG PO TABS: 20.0000 mg | ORAL_TABLET | Freq: Every day | ORAL | Status: DC | PRN

## 2016-01-02 MED ORDER — MELOXICAM 7.5 MG PO TABS: ORAL_TABLET | ORAL | Status: DC

## 2016-01-02 MED ORDER — TRIAMTERENE-HCTZ 75-50 MG PO TABS: 1.0000 | ORAL_TABLET | Freq: Every day | ORAL | Status: DC

## 2016-01-02 NOTE — Progress Notes (Signed)
Subjective:    Patient ID: Bryce Martinez, male    DOB: 11-16-1950, 65 y.o.   MRN: [id number]  HPI   Bryce Martinez     MRN: [id number]      DOB: 1950/12/13   HPI Bryce Martinez is here for follow up and re-evaluation of chronic medical conditions, medication management and review of any available recent lab and radiology data.  Preventive health is updated, specifically  Cancer screening and Immunization.   Questions or concerns regarding consultations or procedures which the PT has had in the interim are  addressed. The PT denies any adverse reactions to current medications since the last visit.  C/o chronic low back pain, was in ED several moths ago due to radiculopathy to left lower leg C/o increased neck and left arm pain, no limitation in movement  ROS Denies recent fever or chills. Denies sinus pressure, nasal congestion, ear pain or sore throat. Denies chest congestion, productive cough or wheezing. Denies chest pains, palpitations and leg swelling Denies abdominal pain, nausea, vomiting,diarrhea or constipation.   Denies dysuria, frequency, hesitancy or incontinence. Denies headaches, seizures, numbness, or tingling. Denies depression, anxiety or insomnia. Denies skin break down or rash.   PE  BP 124/80 mmHg  Pulse 80  Resp 18  Wt 182 lb 0.6 oz (82.573 kg)  SpO2 100%  Patient alert and oriented and in no cardiopulmonary distress.  HEENT: No facial asymmetry, EOMI,   oropharynx pink and moist.  Neck supple no JVD, no mass.  Chest: Clear to auscultation bilaterally.  CVS: S1, S2 no murmurs, no S3.Regular rate.  ABD: Soft non tender.   Ext: No edema  MS: Adequate ROM spine, shoulders, hips and knees.  Skin: Intact, no ulcerations or rash noted.  Psych: Good eye contact, normal affect. Memory intact not anxious or depressed appearing.  CNS: CN 2-12 intact, power,  normal throughout.no focal deficits noted.   Assessment & Plan Neck pain,  chronic X ray to further evaluate, increased left upper extremity symptoms in past 3 to 4 months, likel;y nerve impingement, no extremity weakness or numbness  Essential hypertension Controlled, no change in medication DASH diet and commitment to daily physical activity for a minimum of 30 minutes discussed and encouraged, as a part of hypertension management. The importance of attaining a healthy weight is also discussed.  BP/Weight 01/02/2016 10/17/2015 07/18/2015 07/11/2015 07/10/2015 03/23/2015 [id number]  Systolic BP A999333 123456 [id number] - [id number]  Diastolic BP 80 88 72 85 - 58 70  Wt. (Lbs) 182.04 183.3 178 - 189 179.8 179.4  BMI 24.02 24.19 22.25 - 24.94 23.73 23.67        ED (erectile dysfunction) Unchanged , responds to cialis, script provided, states will order on line as more cost effective  Grief at loss of child Reports lessening symptoms, states "doing well"  IGT (impaired glucose tolerance) Patient educated about the importance of limiting  Carbohydrate intake , the need to commit to daily physical activity for a minimum of 30 minutes , and to commit weight loss. The fact that changes in all these areas will reduce or eliminate all together the development of diabetes is stressed.   Updated lab needed at/ before next visit.   Diabetic Labs Latest Ref Rng 12/19/2015 05/20/2015 10/22/2014 04/19/2014 04/02/2014  HbA1c <5.7 % - 5.8(H) 5.7(H) - 5.9(H)  Chol 0 - 200 mg/dL - 195 186 - 199  HDL >=40 mg/dL - 66 55 - 52  Calc LDL 0 - 99  mg/dL - 119(H) 111(H) - 132(H)  Triglycerides <150 mg/dL - 52 100 - 77  Creatinine 0.70 - 1.25 mg/dL 1.10 1.07 1.11 1.34 1.21   BP/Weight 01/02/2016 10/17/2015 07/18/2015 07/11/2015 07/10/2015 03/23/2015 A999333  Systolic BP A999333 123456 Q000111Q [id number] - [id number]  Diastolic BP 80 88 72 85 - 58 70  Wt. (Lbs) 182.04 183.3 178 - 189 179.8 179.4  BMI 24.02 24.19 22.25 - 24.94 23.73 23.67   No flowsheet data found.     Dyslipidemia Hyperlipidemia:Low fat diet  discussed and encouraged.   Lipid Panel  Lab Results  Component Value Date   CHOL 195 05/20/2015   HDL 66 05/20/2015   LDLCALC 119* 05/20/2015   TRIG 52 05/20/2015   CHOLHDL 3.0 05/20/2015   Updated lab needed at/ before next visit.              Review of Systems     Objective:   Physical Exam        Assessment & Plan:

## 2016-01-02 NOTE — Patient Instructions (Signed)
Annual physical in 6 month, call if you need me sooner please  NEW medications, gabapentin one every night for back pain, meloxicam ( similar to ibuprofen, but wil not irritate stomach) is sent for use ONLY if pain is uncontrolled    Excellent labs  Labs in 5.5 months please  Thankful that you are  doing better  Please get Xray of low back and n eck as soon as possible, I believe pain is from arthrits in both areas  Thanks for choosing [facility], we consider it a privelige to serve you.

## 2016-01-03 NOTE — Assessment & Plan Note (Signed)
Patient educated about the importance of limiting  Carbohydrate intake , the need to commit to daily physical activity for a minimum of 30 minutes , and to commit weight loss. The fact that changes in all these areas will reduce or eliminate all together the development of diabetes is stressed.   Updated lab needed at/ before next visit.   Diabetic Labs Latest Ref Rng 12/19/2015 05/20/2015 10/22/2014 04/19/2014 04/02/2014  HbA1c <5.7 % - 5.8(H) 5.7(H) - 5.9(H)  Chol 0 - 200 mg/dL - 195 186 - 199  HDL >=40 mg/dL - 66 55 - 52  Calc LDL 0 - 99 mg/dL - 119(H) 111(H) - 132(H)  Triglycerides <150 mg/dL - 52 100 - 77  Creatinine 0.70 - 1.25 mg/dL 1.10 1.07 1.11 1.34 1.21   BP/Weight 01/02/2016 10/17/2015 07/18/2015 07/11/2015 07/10/2015 03/23/2015 A999333  Systolic BP A999333 123456 Q000111Q [id number] - [id number]  Diastolic BP 80 88 72 85 - 58 70  Wt. (Lbs) 182.04 183.3 178 - 189 179.8 179.4  BMI 24.02 24.19 22.25 - 24.94 23.73 23.67   No flowsheet data found.

## 2016-01-03 NOTE — Assessment & Plan Note (Signed)
Unchanged , responds to cialis, script provided, states will order on line as more cost effective

## 2016-01-03 NOTE — Assessment & Plan Note (Signed)
Controlled, no change in medication DASH diet and commitment to daily physical activity for a minimum of 30 minutes discussed and encouraged, as a part of hypertension management. The importance of attaining a healthy weight is also discussed.  BP/Weight 01/02/2016 10/17/2015 07/18/2015 07/11/2015 07/10/2015 03/23/2015 A999333  Systolic BP A999333 123456 Q000111Q [id number] - [id number]  Diastolic BP 80 88 72 85 - 58 70  Wt. (Lbs) 182.04 183.3 178 - 189 179.8 179.4  BMI 24.02 24.19 22.25 - 24.94 23.73 23.67

## 2016-01-03 NOTE — Assessment & Plan Note (Signed)
Hyperlipidemia:Low fat diet discussed and encouraged.   Lipid Panel  Lab Results  Component Value Date   CHOL 195 05/20/2015   HDL 66 05/20/2015   LDLCALC 119* 05/20/2015   TRIG 52 05/20/2015   CHOLHDL 3.0 05/20/2015   Updated lab needed at/ before next visit.

## 2016-01-03 NOTE — Assessment & Plan Note (Addendum)
X ray to further evaluate, increased left upper extremity symptoms in past 3 to 4 months, likel;y nerve impingement, no extremity weakness or numbness

## 2016-01-03 NOTE — Assessment & Plan Note (Signed)
Reports lessening symptoms, states "doing well"

## 2016-03-13 ENCOUNTER — Telehealth: Payer: Self-pay | Admitting: Family Medicine

## 2016-03-13 NOTE — Telephone Encounter (Signed)
Form for signature

## 2016-03-13 NOTE — Telephone Encounter (Signed)
Need clarification re heaRT DISEASE, BEATHING , DIFFICULTY WALKING ETC

## 2016-03-13 NOTE — Telephone Encounter (Signed)
Due to back pain

## 2016-03-13 NOTE — Telephone Encounter (Signed)
pt said he has partial disability through the [place] and needs a handicap sticker.

## 2016-03-13 NOTE — Telephone Encounter (Signed)
Do you agree with handicap sticker?

## 2016-03-13 NOTE — Telephone Encounter (Signed)
pls fill out I will sign

## 2016-03-25 ENCOUNTER — Telehealth: Payer: Self-pay | Admitting: Family Medicine

## 2016-03-25 NOTE — Telephone Encounter (Signed)
I have decided to hold off on signing the handicap sticker until I speak with pt furhter, he has not called back and I will wait on this until his appointment unless he calls about it , the form is in folder at my station  Just an update

## 2016-03-27 NOTE — Telephone Encounter (Signed)
pls provide signed form

## 2016-03-27 NOTE — Telephone Encounter (Signed)
Patient states that the request is due to back pain in which he says that he has a partial disability because of.  He would like to know if he can have a temporary sticker until his appointment.

## 2016-03-27 NOTE — Telephone Encounter (Signed)
Noted  

## 2016-05-23 ENCOUNTER — Ambulatory Visit (HOSPITAL_COMMUNITY)
Admission: RE | Admit: 2016-05-23 | Discharge: 2016-05-23 | Disposition: A | Discharge status: Home / Self Care | Payer: 59 | Source: Ambulatory Visit | Attending: Family Medicine | Admitting: Family Medicine

## 2016-05-23 DIAGNOSIS — M5442 Lumbago with sciatica, left side: Secondary | ICD-10-CM | POA: Insufficient documentation

## 2016-05-23 DIAGNOSIS — M5136 Other intervertebral disc degeneration, lumbar region: Secondary | ICD-10-CM | POA: Diagnosis not present

## 2016-05-23 DIAGNOSIS — G8929 Other chronic pain: Secondary | ICD-10-CM

## 2016-05-23 DIAGNOSIS — M542 Cervicalgia: Secondary | ICD-10-CM

## 2016-07-18 ENCOUNTER — Telehealth: Payer: Self-pay | Admitting: Family Medicine

## 2016-07-18 NOTE — Telephone Encounter (Signed)
Called to reschedule - Dr. Simpson out of town °

## 2016-07-24 ENCOUNTER — Encounter: Payer: 59 | Admitting: Family Medicine

## 2016-07-24 ENCOUNTER — Ambulatory Visit (INDEPENDENT_AMBULATORY_CARE_PROVIDER_SITE_OTHER): Payer: 59 | Admitting: Family Medicine

## 2016-07-24 ENCOUNTER — Encounter: Payer: Self-pay | Admitting: Family Medicine

## 2016-07-24 VITALS — BP 130/82 | HR 59 | Resp 16 | Ht 73.0 in | Wt 173.0 lb

## 2016-07-24 DIAGNOSIS — Z23 Encounter for immunization: Secondary | ICD-10-CM | POA: Diagnosis not present

## 2016-07-24 DIAGNOSIS — M5441 Lumbago with sciatica, right side: Secondary | ICD-10-CM

## 2016-07-24 DIAGNOSIS — Z0000 Encounter for general adult medical examination without abnormal findings: Secondary | ICD-10-CM

## 2016-07-24 DIAGNOSIS — Z8619 Personal history of other infectious and parasitic diseases: Secondary | ICD-10-CM

## 2016-07-24 DIAGNOSIS — Z1211 Encounter for screening for malignant neoplasm of colon: Secondary | ICD-10-CM | POA: Diagnosis not present

## 2016-07-24 LAB — POC HEMOCCULT BLD/STL (OFFICE/1-CARD/DIAGNOSTIC): FECAL OCCULT BLD: NEGATIVE

## 2016-07-24 MED ORDER — TADALAFIL 20 MG PO TABS: 20.0000 mg | ORAL_TABLET | Freq: Every day | ORAL | 5 refills | Status: DC | PRN

## 2016-07-24 NOTE — Assessment & Plan Note (Signed)
Good result with cialis , requests same

## 2016-07-24 NOTE — Assessment & Plan Note (Signed)

## 2016-07-24 NOTE — Assessment & Plan Note (Signed)
Worsening, keeps him awake at times,  rLE numbness and weakness noted intermittwently, needs mRI and handicap sticker renewed

## 2016-07-24 NOTE — Patient Instructions (Signed)
F/u in 5.5 month, call if you need me sooner  You are referred to Dr Laural Golden for Dec f/u  Flu vaccine today  You will be contacted re lab results from today  Script for cilais sent  You are being referred for an mRI of your lumbar spine, appt will be scheduled for next 2  To 3 weeks  Thank you  for choosing [facility]. We consider it a privelige to serve you.  Delivering excellent health care in a caring and  compassionate way is our goal.  Partnering with you,  so that together we can achieve this goal is our strategy.

## 2016-07-25 ENCOUNTER — Other Ambulatory Visit: Payer: Self-pay | Admitting: Family Medicine

## 2016-07-25 LAB — COMPREHENSIVE METABOLIC PANEL
ALT: 19 U/L (ref 9–46)
AST: 27 U/L (ref 10–35)
Albumin: 4.2 g/dL (ref 3.6–5.1)
Alkaline Phosphatase: 37 U/L — ABNORMAL LOW (ref 40–115)
BILIRUBIN TOTAL: 1.7 mg/dL — AB (ref 0.2–1.2)
BUN: 19 mg/dL (ref 7–25)
CHLORIDE: 101 mmol/L (ref 98–110)
CO2: 26 mmol/L (ref 20–31)
CREATININE: 1.42 mg/dL — AB (ref 0.70–1.25)
Calcium: 9.4 mg/dL (ref 8.6–10.3)
Glucose, Bld: 73 mg/dL (ref 65–99)
Potassium: 3.6 mmol/L (ref 3.5–5.3)
SODIUM: 140 mmol/L (ref 135–146)
TOTAL PROTEIN: 6.9 g/dL (ref 6.1–8.1)

## 2016-07-26 DIAGNOSIS — M5441 Lumbago with sciatica, right side: Secondary | ICD-10-CM | POA: Insufficient documentation

## 2016-07-26 DIAGNOSIS — Z8619 Personal history of other infectious and parasitic diseases: Secondary | ICD-10-CM | POA: Insufficient documentation

## 2016-07-26 NOTE — Assessment & Plan Note (Signed)
worsening back pain with right lower extremity weakness and numbness  By history, exam consistent with grade 4 lower ext strength and reduced tone and sensation in RLE. Need MRI of lumbar spine to determine management. He is failing on meloxicam and gabapentin, has had lumbar x ray since  2013 establishing arthritis and disc disease

## 2016-07-26 NOTE — Progress Notes (Signed)
   Bryce Martinez     MRN: [id number]      DOB: 1951-09-09   HPI: Patient is in for annual physical exam.  c/o increased low back pain, debilitating at times and disrupting rest, also c/o right lower extremity weakness as though leg will give out intermittently and numbness, has had near fall,  But no actual fall.Denies incontinence of stool or urine . Immunization is reviewed , and  updated    PE; Pleasant male, alert and oriented x 3, in no cardio-pulmonary distress. Afebrile. HEENT No facial trauma or asymetry. Sinuses non tender. EOMI, pupils equally reactive to light. External ears normal, tympanic membranes clear. Oropharynx moist, no exudate. Neck: supple, no adenopathy,JVD or thyromegaly.No bruits.  Chest: Clear to ascultation bilaterally.No crackles or wheezes. Non tender to palpation  Breast: No asymetry,no masses. No nipple discharge or inversion. No axillary or supraclavicular adenopathy  Cardiovascular system; Heart sounds normal,  S1 and  S2 ,no S3.  No murmur, or thrill. Apical beat not displaced Peripheral pulses normal.  Abdomen: Soft, non tender, no organomegaly or masses. No bruits. Bowel sounds normal. No guarding, tenderness or rebound.  Rectal:  Normal sphincter tone. No hemorrhoids or  masses. guaiac negative stool. Prostate smooth and firm    [id number]  ROM of lumbar  spine, adequate in hips , shoulders and knees. No deformity ,swelling or crepitus noted. No muscle wasting or atrophy.   Neurologic: Cranial nerves 2 to 12 intact.Grade 4 power in rLE with reduced tone and sensation in right lower extremity, grade 5 power with normal tone and sensation in the other 3 extremities. No tremor.Gait disturbance noted Skin: Intact, no ulceration, erythema , scaling or rash noted. Pigmentation normal throughout  Psych; Normal mood and affect. Judgement and concentration normal   Assessment & Plan:  Annual physical exam Annual exam as  documented. Counseling done  re healthy lifestyle involving commitment to 150 minutes exercise per week, heart healthy diet, and attaining healthy weight.The importance of adequate sleep also discussed. Regular seat belt use and home safety, is also discussed. Changes in health habits are decided on by the patient with goals and time frames  set for achieving them. Immunization and cancer screening needs are specifically addressed at this visit.   Back pain Worsening, keeps him awake at times,  rLE numbness and weakness noted intermittwently, needs mRI and handicap sticker renewed  ED (erectile dysfunction) Good result with cialis , requests same

## 2016-07-26 NOTE — Assessment & Plan Note (Signed)
Need to f/u with GI in December , will refer

## 2016-07-30 ENCOUNTER — Encounter (INDEPENDENT_AMBULATORY_CARE_PROVIDER_SITE_OTHER): Payer: Self-pay | Admitting: *Deleted

## 2016-08-08 ENCOUNTER — Encounter: Payer: Self-pay | Admitting: Family Medicine

## 2016-08-14 ENCOUNTER — Ambulatory Visit (HOSPITAL_COMMUNITY)
Admission: RE | Admit: 2016-08-14 | Discharge: 2016-08-14 | Disposition: A | Discharge status: Home / Self Care | Payer: 59 | Source: Ambulatory Visit | Attending: Family Medicine | Admitting: Family Medicine

## 2016-08-14 DIAGNOSIS — M48061 Spinal stenosis, lumbar region without neurogenic claudication: Secondary | ICD-10-CM | POA: Insufficient documentation

## 2016-08-14 DIAGNOSIS — M5137 Other intervertebral disc degeneration, lumbosacral region: Secondary | ICD-10-CM | POA: Insufficient documentation

## 2016-08-14 DIAGNOSIS — M5126 Other intervertebral disc displacement, lumbar region: Secondary | ICD-10-CM | POA: Insufficient documentation

## 2016-08-14 DIAGNOSIS — M5441 Lumbago with sciatica, right side: Secondary | ICD-10-CM | POA: Insufficient documentation

## 2016-08-15 ENCOUNTER — Telehealth: Payer: Self-pay

## 2016-08-15 DIAGNOSIS — M5441 Lumbago with sciatica, right side: Secondary | ICD-10-CM

## 2016-08-15 NOTE — Telephone Encounter (Signed)
Called patient. Gave imaging results and referral instructions. Patient verbalized understanding.  Request general neurosurgery referral. Placed referral order.

## 2016-08-15 NOTE — Telephone Encounter (Signed)
-----   Message from Fayrene Helper, MD sent at 08/15/2016  5:51 AM EDT ----- pls advise him MRI shows severe stenosis due to arthritis, and disc disease, I recommend neurosurgery eval, I will refer once you let me know, also ask if he has specific oc he wants to see, if not I will just do generic referral Explain , may or may NOT need surgery, but evaluation by specialist recommended

## 2016-09-03 DIAGNOSIS — M48061 Spinal stenosis, lumbar region without neurogenic claudication: Secondary | ICD-10-CM | POA: Insufficient documentation

## 2016-10-15 ENCOUNTER — Ambulatory Visit (INDEPENDENT_AMBULATORY_CARE_PROVIDER_SITE_OTHER): Payer: 59 | Admitting: Internal Medicine

## 2016-10-15 ENCOUNTER — Encounter (INDEPENDENT_AMBULATORY_CARE_PROVIDER_SITE_OTHER): Payer: Self-pay | Admitting: Internal Medicine

## 2016-10-15 ENCOUNTER — Encounter (INDEPENDENT_AMBULATORY_CARE_PROVIDER_SITE_OTHER): Payer: Self-pay | Admitting: *Deleted

## 2016-10-15 VITALS — BP 120/82 | HR 72 | Temp 98.1°F | Ht 73.0 in | Wt 181.8 lb

## 2016-10-15 DIAGNOSIS — B1710 Acute hepatitis C without hepatic coma: Secondary | ICD-10-CM | POA: Diagnosis not present

## 2016-10-15 NOTE — Patient Instructions (Signed)
Labs and [place].  OV in one year.

## 2016-10-15 NOTE — Progress Notes (Signed)
   Subjective:    Patient ID: Bryce Martinez, male    DOB: February 15, 1951, 65 y.o.   MRN: [id number]  HPIHere today for f/u of his Hepatitis C. Genotype 2. Treated with Sofosbuvir and ribavirin x 12 weeks.  He developed anemia but he did clear the virus.  He is doing well. Appetite is good. No weight loss. No GI complaints. BM x 1 a day. He lost one of his son's last year at age 88 to an MI. He is retired from [employer].   10/17/2015 Hep C quaint undetected. CBC    Component Value Date/Time   WBC 5.2 10/17/2015 1642   RBC 4.78 10/17/2015 1642   HGB 13.7 10/17/2015 1642   HCT 39.4 10/17/2015 1642   PLT 226 10/17/2015 1642   MCV 82.4 10/17/2015 1642   MCH 28.7 10/17/2015 1642   MCHC 34.8 10/17/2015 1642   RDW 13.9 10/17/2015 1642   LYMPHSABS 1.9 03/21/2015 1651   MONOABS 0.3 03/21/2015 1651   EOSABS 0.3 03/21/2015 1651   BASOSABS 0.0 03/21/2015 1651   Hepatic Function Panel     Component Value Date/Time   PROT 6.9 07/24/2016 1427   ALBUMIN 4.2 07/24/2016 1427   AST 27 07/24/2016 1427   ALT 19 07/24/2016 1427   ALKPHOS 37 (L) 07/24/2016 1427   BILITOT 1.7 (H) 07/24/2016 1427   BILIDIR 0.2 10/17/2015 1642   IBILI 0.7 10/17/2015 1642       Review of Systems Past Medical History:  Diagnosis Date  . Allergy   . Arthritis   . ED (erectile dysfunction)   . Hepatitis C   . Hyperlipidemia   . Hypertension     Past Surgical History:  Procedure Laterality Date  . APPENDECTOMY    . CHOLECYSTECTOMY N/A 04/25/2014   Procedure: LAPAROSCOPIC CHOLECYSTECTOMY;  Surgeon: Jamesetta So, MD;  Location: [facility];  Service: General;  Laterality: N/A;  . HEMICOLECTOMY Right    lap hand assisted  . right shoulder surgery Right 03/2005   rotator cuff repair    No Known Allergies  Current Outpatient Prescriptions on File Prior to Visit  Medication Sig Dispense Refill  . gabapentin (NEURONTIN) 100 MG capsule Take 1 capsule (100 mg total) by mouth at bedtime. 30 capsule 6  . tadalafil  (CIALIS) 20 MG tablet Take 1 tablet (20 mg total) by mouth daily as needed for erectile dysfunction. 10 tablet 5  . triamterene-hydrochlorothiazide (MAXZIDE) 75-50 MG tablet Take 1 tablet by mouth daily. 30 tablet 7   No current facility-administered medications on file prior to visit.        Objective:   Physical Exam Blood pressure 120/82, pulse 72, temperature 98.1 F (36.7 C), height 6\' 1"  (1.854 m), weight 181 lb 12.8 oz (82.5 kg).  Alert and oriented. Skin warm and dry. Oral mucosa is moist.   . Sclera anicteric, conjunctivae is pink. Thyroid not enlarged. No cervical lymphadenopathy. Lungs clear. Heart regular rate and rhythm.  Abdomen is soft. Bowel sounds are positive. No hepatomegaly. No abdominal masses felt. No tenderness.  No edema to lower extremities.         Assessment & Plan:  Hepatitis C. He is doing well.  [place] RUQ. Hep C quaint, Hepatic function and CBC OV in 1 year.

## 2016-10-17 ENCOUNTER — Ambulatory Visit (HOSPITAL_COMMUNITY)
Admission: RE | Admit: 2016-10-17 | Discharge: 2016-10-17 | Disposition: A | Discharge status: Home / Self Care | Payer: 59 | Source: Ambulatory Visit | Attending: Internal Medicine | Admitting: Internal Medicine

## 2016-10-17 DIAGNOSIS — Z9049 Acquired absence of other specified parts of digestive tract: Secondary | ICD-10-CM | POA: Diagnosis not present

## 2016-10-17 DIAGNOSIS — B1710 Acute hepatitis C without hepatic coma: Secondary | ICD-10-CM | POA: Insufficient documentation

## 2016-10-21 ENCOUNTER — Other Ambulatory Visit (INDEPENDENT_AMBULATORY_CARE_PROVIDER_SITE_OTHER): Payer: Self-pay | Admitting: Internal Medicine

## 2016-10-25 LAB — CBC WITH DIFFERENTIAL/PLATELET
BASOS: 1 %
Basophils Absolute: 0 10*3/uL (ref 0.0–0.2)
EOS (ABSOLUTE): 0.1 10*3/uL (ref 0.0–0.4)
EOS: 3 %
HEMATOCRIT: 41.2 % (ref 37.5–51.0)
Hemoglobin: 13.6 g/dL (ref 13.0–17.7)
IMMATURE GRANS (ABS): 0 10*3/uL (ref 0.0–0.1)
IMMATURE GRANULOCYTES: 0 %
LYMPHS: 43 %
Lymphocytes Absolute: 1.7 10*3/uL (ref 0.7–3.1)
MCH: 27 pg (ref 26.6–33.0)
MCHC: 33 g/dL (ref 31.5–35.7)
MCV: 82 fL (ref 79–97)
Monocytes Absolute: 0.4 10*3/uL (ref 0.1–0.9)
Monocytes: 9 %
NEUTROS PCT: 44 %
Neutrophils Absolute: 1.8 10*3/uL (ref 1.4–7.0)
PLATELETS: 239 10*3/uL (ref 150–379)
RBC: 5.03 x10E6/uL (ref 4.14–5.80)
RDW: 14.9 % (ref 12.3–15.4)
WBC: 4 10*3/uL (ref 3.4–10.8)

## 2016-10-25 LAB — HEPATIC FUNCTION PANEL
ALBUMIN: 4.5 g/dL (ref 3.6–4.8)
ALT: 16 IU/L (ref 0–44)
AST: 22 IU/L (ref 0–40)
Alkaline Phosphatase: 49 IU/L (ref 39–117)
BILIRUBIN TOTAL: 1.1 mg/dL (ref 0.0–1.2)
BILIRUBIN, DIRECT: 0.25 mg/dL (ref 0.00–0.40)
Total Protein: 7.3 g/dL (ref 6.0–8.5)

## 2016-10-25 LAB — HCV REALTIME ABBOTT: Hepatitis C Quantitation: <12 IU/mL

## 2016-11-11 ENCOUNTER — Other Ambulatory Visit: Payer: Self-pay | Admitting: Family Medicine

## 2017-01-08 ENCOUNTER — Encounter: Payer: Self-pay | Admitting: Family Medicine

## 2017-01-08 ENCOUNTER — Ambulatory Visit (INDEPENDENT_AMBULATORY_CARE_PROVIDER_SITE_OTHER): Payer: 59 | Admitting: Family Medicine

## 2017-01-08 VITALS — BP 130/82 | HR 70 | Resp 15 | Ht 73.0 in | Wt 183.0 lb

## 2017-01-08 DIAGNOSIS — E785 Hyperlipidemia, unspecified: Secondary | ICD-10-CM

## 2017-01-08 DIAGNOSIS — G8929 Other chronic pain: Secondary | ICD-10-CM | POA: Diagnosis not present

## 2017-01-08 DIAGNOSIS — J301 Allergic rhinitis due to pollen: Secondary | ICD-10-CM

## 2017-01-08 DIAGNOSIS — N528 Other male erectile dysfunction: Secondary | ICD-10-CM

## 2017-01-08 DIAGNOSIS — I10 Essential (primary) hypertension: Secondary | ICD-10-CM | POA: Diagnosis not present

## 2017-01-08 DIAGNOSIS — Z125 Encounter for screening for malignant neoplasm of prostate: Secondary | ICD-10-CM | POA: Diagnosis not present

## 2017-01-08 DIAGNOSIS — M5441 Lumbago with sciatica, right side: Secondary | ICD-10-CM | POA: Diagnosis not present

## 2017-01-08 MED ORDER — FLUTICASONE PROPIONATE 50 MCG/ACT NA SUSP: 2.0000 | Freq: Every day | NASAL | 3 refills | Status: DC

## 2017-01-08 MED ORDER — TADALAFIL 20 MG PO TABS: 20.0000 mg | ORAL_TABLET | Freq: Every day | ORAL | 1 refills | Status: DC | PRN

## 2017-01-08 MED ORDER — ASPIRIN EC 81 MG PO TBEC: 81.0000 mg | DELAYED_RELEASE_TABLET | Freq: Every day | ORAL | 3 refills | Status: DC

## 2017-01-08 NOTE — Patient Instructions (Signed)
Welcome to medicare in September, call if you need me before  We will print cialis prescription for you  Fasting lipid, chem 7, PSA and TSH  At [facility] this week please  Call Dr Cyndy Freeze to arrange epidural  ENJOY road trip   Thank you  for choosing [facility]. We consider it a privelige to serve you.  Delivering excellent health care in a caring and  compassionate way is our goal.  Partnering with you,  so that together we can achieve this goal is our strategy.   Please work on good  health habits so that your health will improve. 1. Commitment to daily physical activity for 30 to 60  minutes, if you are able to do this.  2. Commitment to wise food choices. Aim for half of your  food intake to be vegetable and fruit, one quarter starchy foods, and one quarter protein. Try to eat on a regular schedule  3 meals per day, snacking between meals should be limited to vegetables or fruits or small portions of nuts. 64 ounces of water per day is generally recommended, unless you have specific health conditions, like heart failure or kidney failure where you will need to limit fluid intake.  3. Commitment to sufficient and a  good quality of physical and mental rest daily, generally between 6 to 8 hours per day.  WITH PERSISTANCE AND PERSEVERANCE, THE IMPOSSIBLE , BECOMES THE NORM!

## 2017-01-09 ENCOUNTER — Encounter: Payer: Self-pay | Admitting: Family Medicine

## 2017-01-09 NOTE — Assessment & Plan Note (Signed)
No current flare, will use OTC medication as needed

## 2017-01-09 NOTE — Assessment & Plan Note (Signed)
Good response to cialis, script provided

## 2017-01-09 NOTE — Progress Notes (Signed)
   Bryce Martinez     MRN: [id number]      DOB: Feb 05, 1951   HPI Bryce Martinez is here for follow up and re-evaluation of chronic medical conditions, medication management and review of any available recent lab and radiology data.  Preventive health is updated, specifically  Cancer screening and Immunization.   Questions or concerns regarding consultations or procedures which the PT has had in the interim are  Addressed.was evaluated by neurosurgeon epidural recommended, and has got treatment for knee pain The PT denies any adverse reactions to current medications since the last visit.  There are no new concerns.  There are no specific complaints   ROS Denies recent fever or chills. Denies sinus pressure, nasal congestion, ear pain or sore throat. Denies chest congestion, productive cough or wheezing. Denies chest pains, palpitations and leg swelling Denies abdominal pain, nausea, vomiting,diarrhea or constipation.   Denies dysuria, frequency, hesitancy or incontinence Denies headaches, seizures, numbness, or tingling. Denies depression, anxiety or insomnia. Denies skin break down or rash.   PE  BP 130/82   Pulse 70   Resp 15   Ht 6\' 1"  (1.854 m)   Wt 183 lb (83 kg)   SpO2 100%   BMI 24.14 kg/m   Patient alert and oriented and in no cardiopulmonary distress.  HEENT: No facial asymmetry, EOMI,   oropharynx pink and moist.  Neck supple no JVD, no mass.  Chest: Clear to auscultation bilaterally.  CVS: S1, S2 no murmurs, no S3.Regular rate.  ABD: Soft non tender.   Ext: No edema  MS: Adequate ROM spine, shoulders, hips and knees.  Skin: Intact, no ulcerations or rash noted.  Psych: Good eye contact, normal affect. Memory intact not anxious or depressed appearing.  CNS: CN 2-12 intact, power,  normal throughout.no focal deficits noted.   Assessment & Plan  Essential hypertension Controlled, no change in medication DASH diet and commitment to daily physical  activity for a minimum of 30 minutes discussed and encouraged, as a part of hypertension management. The importance of attaining a healthy weight is also discussed.  BP/Weight 01/08/2017 10/15/2016 07/24/2016 01/02/2016 10/17/2015 4/00/8676 11/13/5091  Systolic BP 267 124 580 998 338 250 539  Diastolic BP 82 82 82 80 88 72 85  Wt. (Lbs) 183 181.8 173 182.04 183.3 178 -  BMI 24.14 23.99 22.82 24.02 24.19 22.25 -       Right-sided low back pain with right-sided sciatica Will call in for epidural offered by neurosurgeon, has upcoming road trip and is still symptomatic  Dyslipidemia Hyperlipidemia:Low fat diet discussed and encouraged.   Lipid Panel  Lab Results  Component Value Date   CHOL 195 05/20/2015   HDL 66 05/20/2015   LDLCALC 119 (H) 05/20/2015   TRIG 52 05/20/2015   CHOLHDL 3.0 05/20/2015  Updated lab needed at/ before next visit.      Allergic rhinitis No current flare, will use OTC medication as needed  ED (erectile dysfunction) Good response to cialis, script provided

## 2017-01-09 NOTE — Assessment & Plan Note (Signed)
Controlled, no change in medication DASH diet and commitment to daily physical activity for a minimum of 30 minutes discussed and encouraged, as a part of hypertension management. The importance of attaining a healthy weight is also discussed.  BP/Weight 01/08/2017 10/15/2016 07/24/2016 01/02/2016 10/17/2015 4/80/1655 01/08/4826  Systolic BP 078 675 449 201 007 121 975  Diastolic BP 82 82 82 80 88 72 85  Wt. (Lbs) 183 181.8 173 182.04 183.3 178 -  BMI 24.14 23.99 22.82 24.02 24.19 22.25 -

## 2017-01-09 NOTE — Assessment & Plan Note (Signed)
Will call in for epidural offered by neurosurgeon, has upcoming road trip and is still symptomatic

## 2017-01-09 NOTE — Assessment & Plan Note (Signed)
Hyperlipidemia:Low fat diet discussed and encouraged.   Lipid Panel  Lab Results  Component Value Date   CHOL 195 05/20/2015   HDL 66 05/20/2015   LDLCALC 119 (H) 05/20/2015   TRIG 52 05/20/2015   CHOLHDL 3.0 05/20/2015  Updated lab needed at/ before next visit.

## 2017-01-11 ENCOUNTER — Encounter: Payer: Self-pay | Admitting: Family Medicine

## 2017-01-11 LAB — BASIC METABOLIC PANEL
BUN/Creatinine Ratio: 14 (ref 10–24)
BUN: 15 mg/dL (ref 8–27)
CALCIUM: 9.2 mg/dL (ref 8.6–10.2)
CO2: 27 mmol/L (ref 18–29)
CREATININE: 1.06 mg/dL (ref 0.76–1.27)
Chloride: 94 mmol/L — ABNORMAL LOW (ref 96–106)
GFR, EST AFRICAN AMERICAN: 85 mL/min/{1.73_m2} (ref >59)
GFR, EST NON AFRICAN AMERICAN: 74 mL/min/{1.73_m2} (ref >59)
Glucose: 75 mg/dL (ref 65–99)
Potassium: 4 mmol/L (ref 3.5–5.2)
Sodium: 138 mmol/L (ref 134–144)

## 2017-01-11 LAB — TSH: TSH: 1.69 u[IU]/mL (ref 0.450–4.500)

## 2017-01-11 LAB — LIPID PANEL
CHOLESTEROL TOTAL: 176 mg/dL (ref 100–199)
Chol/HDL Ratio: 3.1 ratio units (ref 0.0–5.0)
HDL: 56 mg/dL (ref >39)
LDL CALC: 107 mg/dL — AB (ref 0–99)
TRIGLYCERIDES: 66 mg/dL (ref 0–149)
VLDL Cholesterol Cal: 13 mg/dL (ref 5–40)

## 2017-01-11 LAB — PSA: Prostate Specific Ag, Serum: 1.1 ng/mL (ref 0.0–4.0)

## 2017-03-24 IMAGING — MR MR LUMBAR SPINE W/O CM
4 of 5 series · 15 of 48 positions shown · non-contrast
Comparison: Radiography 05/23/2016

CLINICAL DATA: Low back pain radiating down the right leg for 10
years. No known injury.

EXAM:
MRI LUMBAR SPINE WITHOUT CONTRAST
TECHNIQUE: Multiplanar, multisequence MR imaging of the lumbar spine was
performed. No intravenous contrast was administered.

[Series 5: T2 · sagittal · 4.0mm · 0.78mm/px · 6 of 15 slices shown (1 of 2)]
[im 1/15]
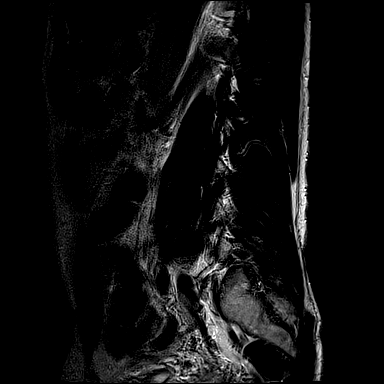
[im 3/15]
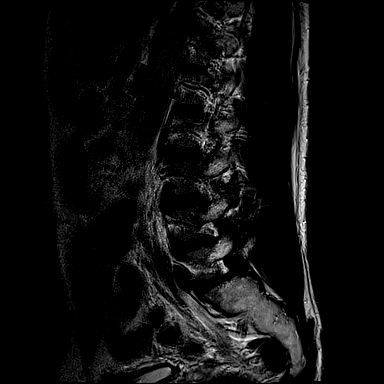
[im 6/15]
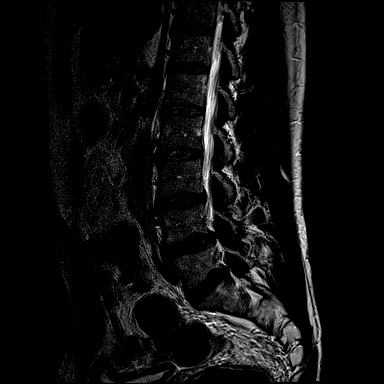
[im 9/15]
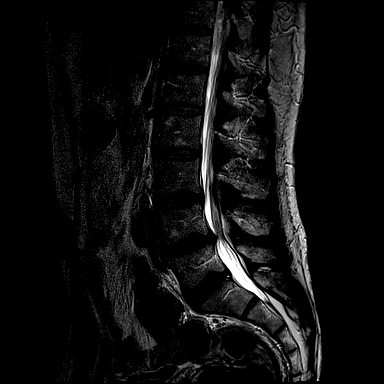
[im 12/15]
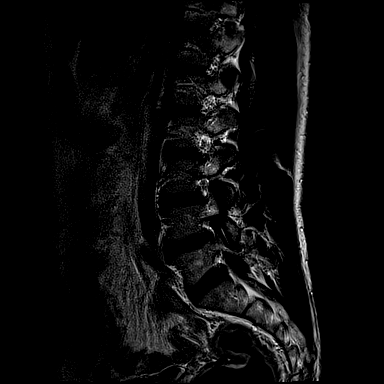
[im 15/15]
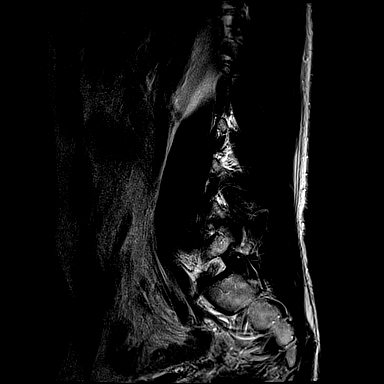

[Series 6: T1 · sagittal · 4.0mm · 0.43mm/px · 3 of 15 slices shown (1 of 2)]
[im 3/15]
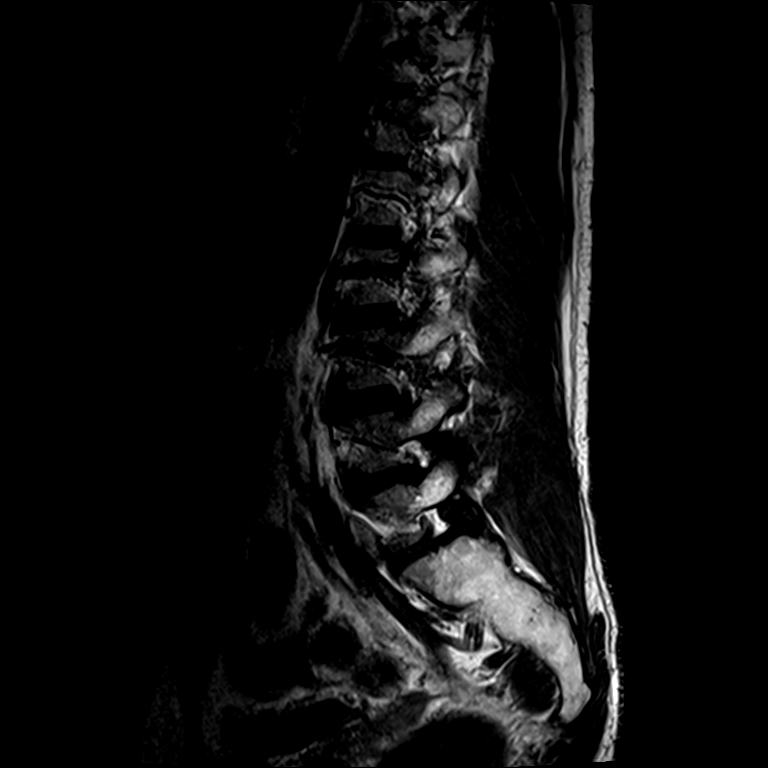
[im 9/15]
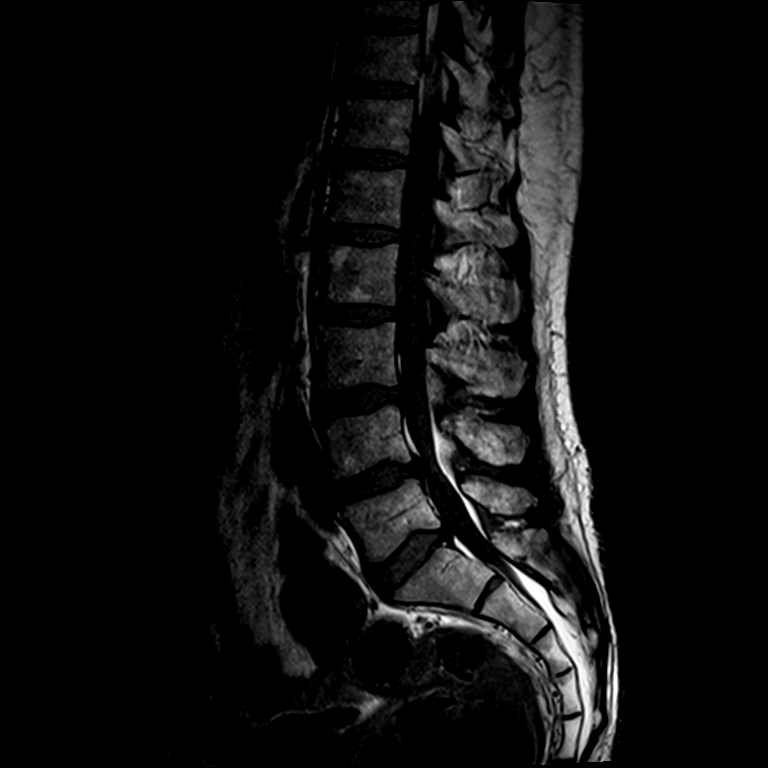
[im 15/15]
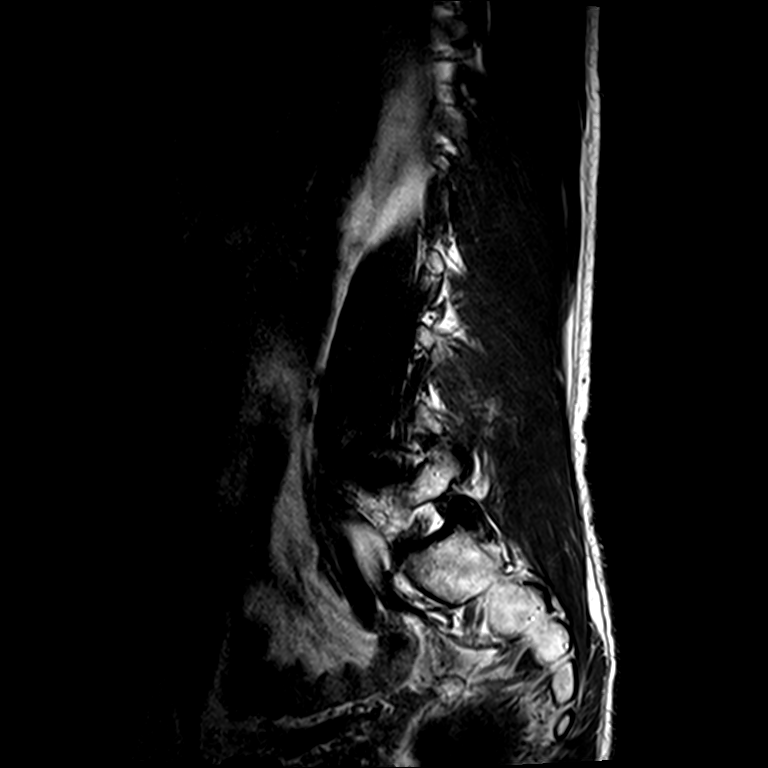

[Series 8: T2 · axial · 4.0mm · 0.20mm/px · z∈[-76,+48]mm · 3 of 37 slices shown (2 of 2)]
[im 6/37]
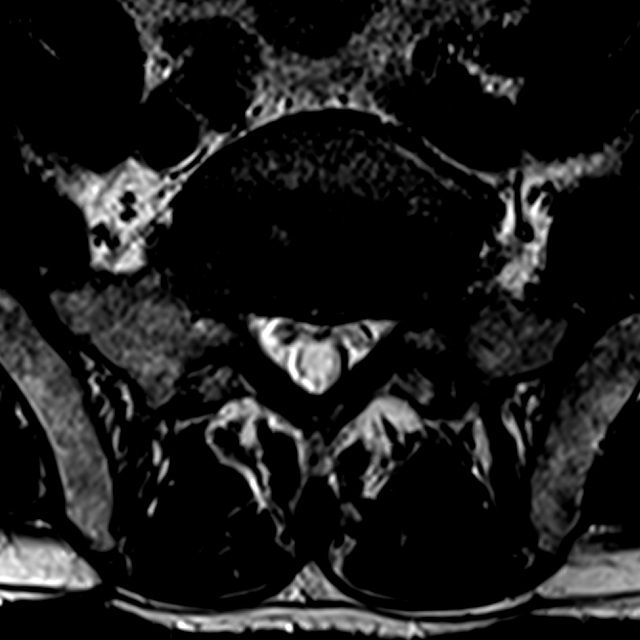
[im 19/37]
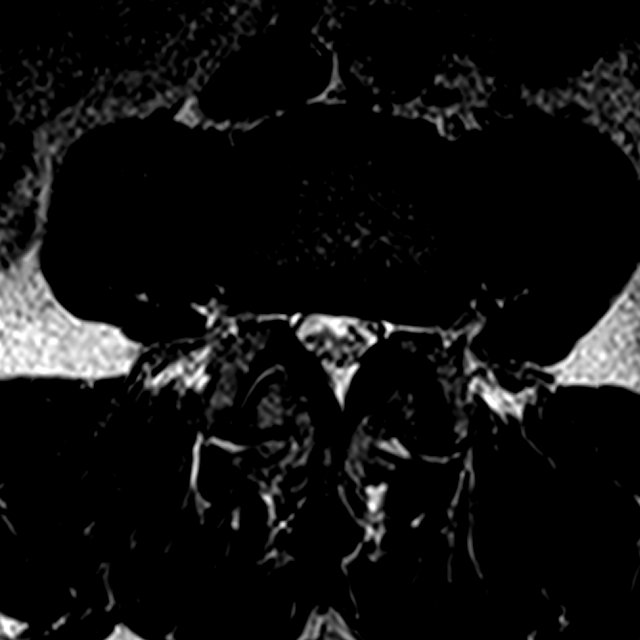
[im 31/37]
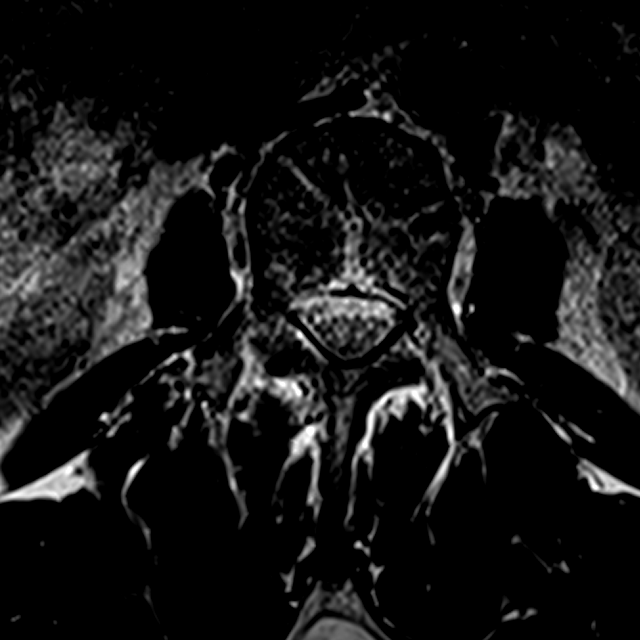

[Series 9: T1 · axial · 4.0mm · 0.20mm/px · z∈[-76,+48]mm · 3 of 37 slices shown (2 of 2)]
[im 6/37]
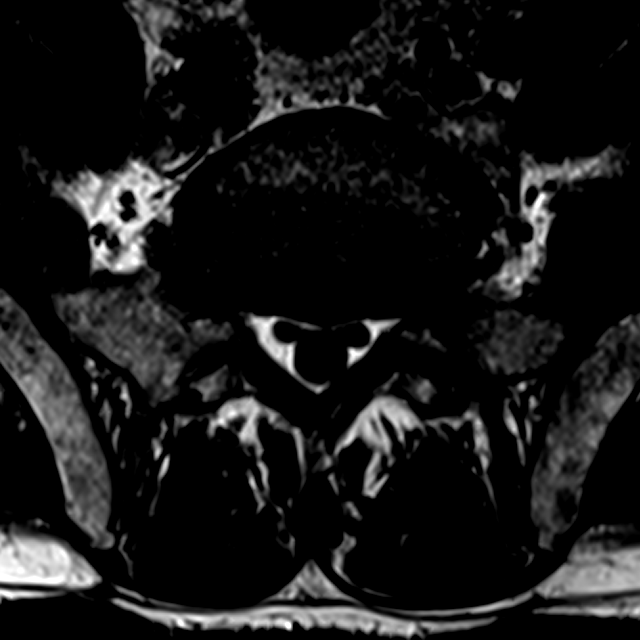
[im 19/37]
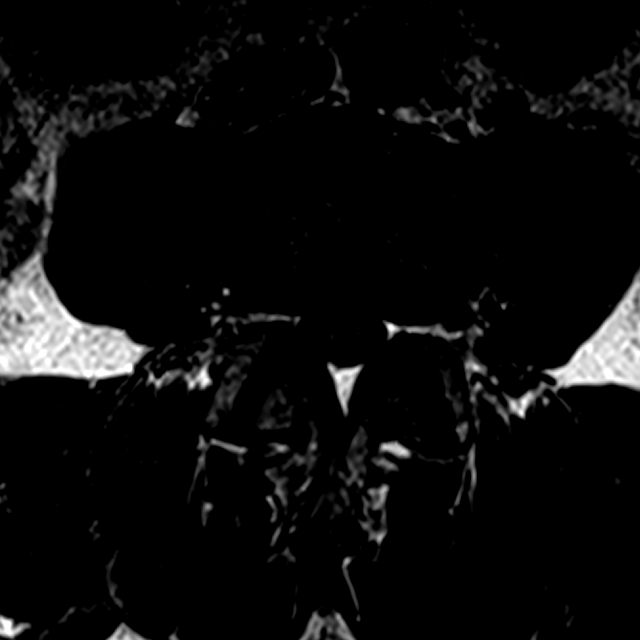
[im 31/37]
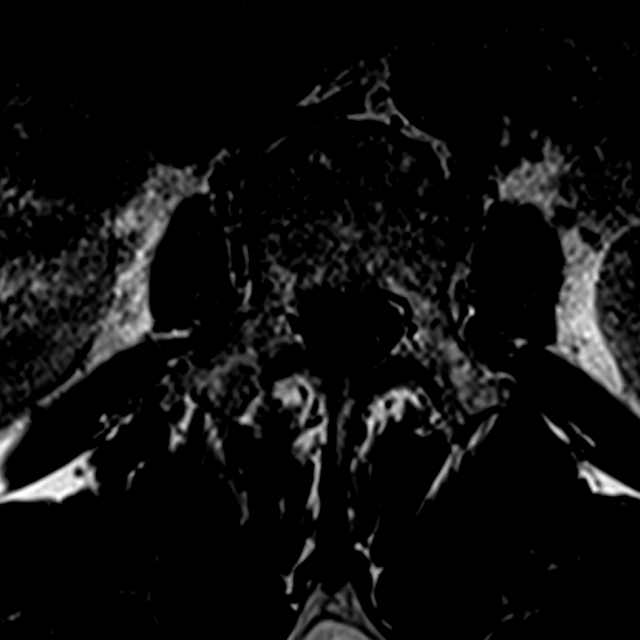

[15 of 48 positions shown; findings below may reference images not displayed]

FINDINGS: Segmentation:  Standard.

Alignment:  Physiologic.

Vertebrae:  No fracture, evidence of discitis, or bone lesion.

Conus medullaris: Extends to the L1 level and appears normal.

Paraspinal and other soft tissues: Prominent rectal stool volume.

Disc levels:

T12- L1: Unremarkable.

L1-L2: Unremarkable.

L2-L3: Unremarkable.

L3-L4: Facet and ligament hypertrophy and mild disc bulging. Mild
triangular narrowing of the thecal sac without impingement

L4-L5: Disc narrowing with circumferential bulging. Facet
arthropathy with marked ligamentum flavum thickening. Near complete
effacement of CSF. Bilateral inferior foraminal narrowing without L4
compression.

L5-S1:Disc bulging with posterior annular fissure. Mild bilateral
foraminal narrowing.
IMPRESSION: 1. Moderate to advanced canal stenosis at L4-5 from disc bulge and
posterior element hypertrophy.
2. L5-S1 disc degeneration and annular fissure. Mild bilateral
foraminal narrowing.

## 2017-05-14 ENCOUNTER — Other Ambulatory Visit: Payer: Self-pay | Admitting: Family Medicine

## 2017-06-30 ENCOUNTER — Encounter: Payer: Self-pay | Admitting: *Deleted

## 2017-07-23 ENCOUNTER — Encounter: Payer: 59 | Admitting: Family Medicine

## 2017-09-08 ENCOUNTER — Ambulatory Visit: Payer: 59

## 2017-09-08 ENCOUNTER — Telehealth: Payer: Self-pay

## 2017-09-08 DIAGNOSIS — R7302 Impaired glucose tolerance (oral): Secondary | ICD-10-CM

## 2017-09-08 DIAGNOSIS — E785 Hyperlipidemia, unspecified: Secondary | ICD-10-CM

## 2017-09-08 DIAGNOSIS — I10 Essential (primary) hypertension: Secondary | ICD-10-CM

## 2017-09-08 NOTE — Telephone Encounter (Signed)
Labs ordered through [facility]

## 2017-09-16 DIAGNOSIS — E785 Hyperlipidemia, unspecified: Secondary | ICD-10-CM | POA: Diagnosis not present

## 2017-09-16 DIAGNOSIS — Z781 Physical restraint status: Secondary | ICD-10-CM | POA: Diagnosis not present

## 2017-09-16 DIAGNOSIS — I10 Essential (primary) hypertension: Secondary | ICD-10-CM | POA: Diagnosis not present

## 2017-09-16 DIAGNOSIS — R7302 Impaired glucose tolerance (oral): Secondary | ICD-10-CM | POA: Diagnosis not present

## 2017-09-17 LAB — BASIC METABOLIC PANEL
BUN / CREAT RATIO: 15 (ref 10–24)
BUN: 15 mg/dL (ref 8–27)
CO2: 27 mmol/L (ref 20–29)
CREATININE: 1.03 mg/dL (ref 0.76–1.27)
Calcium: 9.5 mg/dL (ref 8.6–10.2)
Chloride: 100 mmol/L (ref 96–106)
GFR calc Af Amer: 88 mL/min/{1.73_m2} (ref >59)
GFR calc non Af Amer: 76 mL/min/{1.73_m2} (ref >59)
Glucose: 105 mg/dL — ABNORMAL HIGH (ref 65–99)
Potassium: 3.9 mmol/L (ref 3.5–5.2)
Sodium: 142 mmol/L (ref 134–144)

## 2017-09-17 LAB — LIPID PANEL
CHOLESTEROL TOTAL: 225 mg/dL — AB (ref 100–199)
Chol/HDL Ratio: 3.7 ratio (ref 0.0–5.0)
HDL: 61 mg/dL (ref >39)
LDL CALC: 144 mg/dL — AB (ref 0–99)
TRIGLYCERIDES: 98 mg/dL (ref 0–149)
VLDL CHOLESTEROL CAL: 20 mg/dL (ref 5–40)

## 2017-09-17 LAB — CBC
Hematocrit: 42 % (ref 37.5–51.0)
Hemoglobin: 13.7 g/dL (ref 13.0–17.7)
MCH: 27.3 pg (ref 26.6–33.0)
MCHC: 32.6 g/dL (ref 31.5–35.7)
MCV: 84 fL (ref 79–97)
PLATELETS: 272 10*3/uL (ref 150–379)
RBC: 5.02 x10E6/uL (ref 4.14–5.80)
RDW: 14.4 % (ref 12.3–15.4)
WBC: 3.8 10*3/uL (ref 3.4–10.8)

## 2017-09-22 ENCOUNTER — Ambulatory Visit (INDEPENDENT_AMBULATORY_CARE_PROVIDER_SITE_OTHER): Payer: Medicare Other | Admitting: Family Medicine

## 2017-09-22 ENCOUNTER — Other Ambulatory Visit: Payer: Self-pay

## 2017-09-22 ENCOUNTER — Encounter (INDEPENDENT_AMBULATORY_CARE_PROVIDER_SITE_OTHER): Payer: Self-pay | Admitting: Internal Medicine

## 2017-09-22 ENCOUNTER — Encounter: Payer: Self-pay | Admitting: Family Medicine

## 2017-09-22 VITALS — BP 160/82 | HR 59 | Resp 16 | Ht 73.0 in | Wt 180.1 lb

## 2017-09-22 DIAGNOSIS — Z23 Encounter for immunization: Secondary | ICD-10-CM | POA: Diagnosis not present

## 2017-09-22 DIAGNOSIS — I10 Essential (primary) hypertension: Secondary | ICD-10-CM

## 2017-09-22 DIAGNOSIS — Z0000 Encounter for general adult medical examination without abnormal findings: Secondary | ICD-10-CM

## 2017-09-22 LAB — POCT URINALYSIS DIPSTICK
BILIRUBIN UA: NEGATIVE
Blood, UA: NEGATIVE
GLUCOSE UA: NEGATIVE
KETONES UA: NEGATIVE
LEUKOCYTES UA: NEGATIVE
Nitrite, UA: NEGATIVE
PROTEIN UA: NEGATIVE
Spec Grav, UA: 1.02 (ref 1.010–1.025)
Urobilinogen, UA: 1 E.U./dL
pH, UA: 7 (ref 5.0–8.0)

## 2017-09-22 MED ORDER — TADALAFIL 20 MG PO TABS: ORAL_TABLET | ORAL | 2 refills | Status: DC

## 2017-09-22 MED ORDER — TADALAFIL 20 MG PO TABS: 10.0000 mg | ORAL_TABLET | Freq: Every day | ORAL | 5 refills | Status: DC | PRN

## 2017-09-22 NOTE — Progress Notes (Signed)
Preventive Screening-Counseling & Management   Patient present here today for a welcome to medicare visit.   Current Problems (verified)   Medications Prior to Visit Allergies (verified)   PAST HISTORY  Family History (updated)  Social History Married, 1 son (passed away), retired from [employer]. Never a smoker or drinker    Risk Factors  Current exercise habits: Walks around daily and gets physical activity when able like walking dogs or working around the house    Dietary issues discussed: Eats variety of foods, encouraged to eat more baked foods and less fried and more fruits and vegetables    Cardiac risk factors: HTN  Depression Screen  (Note: if answer to either of the following is "Yes", a more complete depression screening is indicated)   Over the past two weeks, have you felt down, depressed or hopeless? No  Over the past two weeks, have you felt little interest or pleasure in doing things? No  Have you lost interest or pleasure in daily life? No  Do you often feel hopeless? No  Do you cry easily over simple problems? No   Activities of Daily Living  In your present state of health, do you have any difficulty performing the following activities?  Driving?: No Managing money?: No Feeding yourself?:No Getting from bed to chair?:No Climbing a flight of stairs?:No Preparing food and eating?:No Bathing or showering?:No Getting dressed?:No Getting to the toilet?:No Using the toilet?:No Moving around from place to place?: No, back hurts from time to time but doesn't impair ability to get around   Fall Risk Assessment In the past year have you fallen or had a near fall?:No Are you currently taking any medications that make you dizzy?:No   Hearing Difficulties: No Do you often ask people to speak up or repeat themselves? Was having problems with right ear but had a bad tooth pulled and its gotten better  Do you experience ringing or noises in your  ears?:No Do you have difficulty understanding soft or whispered voices?:No  Cognitive Testing  Alert? Yes Normal Appearance?Yes  Oriented to person? Yes Place? Yes  Time? Yes  Displays appropriate judgment?Yes  Can read the correct time from a watch face? yes Are you having problems remembering things?No  Advanced Directives have been discussed with the patient?Yes, information given    List the Names of Other Physician/Practitioners you currently use: none    Indicate any recent Medical Services you may have received from other than [facility] providers in the past year (date may be approximate).     Medicare Attestation  I have personally reviewed:  The patient's medical and social history  Their use of alcohol, tobacco or illicit drugs  Their current medications and supplements  The patient's functional ability including ADLs,fall risks, home safety risks, cognitive, and hearing and visual impairment  Diet and physical activities  Evidence for depression or mood disorders  The patient's weight, height, BMI, and visual acuity have been recorded in the chart. I have made referrals, counseling, and provided education to the patient based on review of the above and I have provided the patient with a written personalized care plan for preventive services.    Physical Exam BP (!) 160/82   Pulse (!) 59   Resp 16   Ht 6\' 1"  (1.854 m)   Wt 180 lb 1.9 oz (81.7 kg)   SpO2 98%   BMI 23.76 kg/m  EKG; sinus bradycardia, Rate 54, no LVH , no ischemia  Assessment &  Plan:  Welcome to Medicare preventive visit Exam as documented No significant risk factors for heart disease or cancer EKG: sinus bradycardia , no lVH or ischemia. CCUA: normal Pt to work on living wil, discussion started and material given for review  Essential hypertension Elevated at this visit, generally well controlled No med change counseled to limit sodium and follow DASH diet , will re evaluate in 6 to 8 weeks  without changing medication today

## 2017-09-22 NOTE — Patient Instructions (Addendum)
Annual physical exam in January with mD  Blood pressure is high today  Cialis is prescribed  prevnar today

## 2017-09-23 ENCOUNTER — Encounter: Payer: Self-pay | Admitting: Family Medicine

## 2017-09-23 LAB — HEPATIC FUNCTION PANEL
ALT: 19 [IU]/L (ref 0–44)
AST: 18 [IU]/L (ref 0–40)
Albumin: 4.3 g/dL (ref 3.6–4.8)
Alkaline Phosphatase: 50 [IU]/L (ref 39–117)
Bilirubin Total: 0.5 mg/dL (ref 0.0–1.2)
Bilirubin, Direct: 0.12 mg/dL (ref 0.00–0.40)
Total Protein: 6.9 g/dL (ref 6.0–8.5)

## 2017-09-23 LAB — SPECIMEN STATUS REPORT

## 2017-09-23 LAB — HGB A1C W/O EAG: Hgb A1c MFr Bld: 5.5 % (ref 4.8–5.6)

## 2017-09-23 NOTE — Assessment & Plan Note (Signed)
Elevated at this visit, generally well controlled No med change counseled to limit sodium and follow DASH diet , will re evaluate in 6 to 8 weeks without changing medication today

## 2017-09-23 NOTE — Assessment & Plan Note (Signed)
Exam as documented No significant risk factors for heart disease or cancer EKG: sinus bradycardia , no lVH or ischemia. CCUA: normal Pt to work on living wil, discussion started and material given for review

## 2017-10-15 ENCOUNTER — Encounter (INDEPENDENT_AMBULATORY_CARE_PROVIDER_SITE_OTHER): Payer: Self-pay | Admitting: Internal Medicine

## 2017-10-15 ENCOUNTER — Other Ambulatory Visit (INDEPENDENT_AMBULATORY_CARE_PROVIDER_SITE_OTHER): Payer: Self-pay | Admitting: Internal Medicine

## 2017-10-15 ENCOUNTER — Encounter (INDEPENDENT_AMBULATORY_CARE_PROVIDER_SITE_OTHER): Payer: Self-pay | Admitting: *Deleted

## 2017-10-15 ENCOUNTER — Ambulatory Visit (INDEPENDENT_AMBULATORY_CARE_PROVIDER_SITE_OTHER): Payer: Medicare Other | Admitting: Internal Medicine

## 2017-10-15 VITALS — BP 120/84 | HR 60 | Temp 98.4°F | Ht 73.0 in | Wt 178.5 lb

## 2017-10-15 DIAGNOSIS — B182 Chronic viral hepatitis C: Secondary | ICD-10-CM

## 2017-10-15 NOTE — Progress Notes (Signed)
   Subjective:    Patient ID: Bryce Martinez, male    DOB: September 21, 1951, 66 y.o.   MRN: [id number]  HPIHere today for f/u. Last seen in December of 2017. Hx of Hepatitis C. Genotype 2. treated with Sofosbuvir and Ribavirin. Successfully treated in 2016. Retired. Doing good. Appetite is good. No weight loss. BMs x 1 a day.   10/21/2016 Hep  C quaint undetected. Hepatic Function Latest Ref Rng & Units 09/16/2017 10/21/2016 07/24/2016  Total Protein 6.0 - 8.5 g/dL 6.9 7.3 6.9  Albumin 3.6 - 4.8 g/dL 4.3 4.5 4.2  AST 0 - 40 IU/L '18 22 27  '$ ALT 0 - 44 IU/L '19 16 19  '$ Alk Phosphatase 39 - 117 IU/L 50 49 37(L)  Total Bilirubin 0.0 - 1.2 mg/dL 0.5 1.1 1.7(H)  Bilirubin, Direct 0.00 - 0.40 mg/dL 0.12 0.25 -       10/17/2016 US abdomen; Mildly increased hepatic echotexture likely reflects fatty infiltrative change but could reflect other hepatocellular disease. There is no focal mass or ductal dilation. Previous cholecystectomy.    Review of Systems Past Medical History:  Diagnosis Date  . Allergy   . Arthritis   . ED (erectile dysfunction)   . Hepatitis C   . Hyperlipidemia   . Hypertension     Past Surgical History:  Procedure Laterality Date  . APPENDECTOMY    . CHOLECYSTECTOMY N/A 04/25/2014   Procedure: LAPAROSCOPIC CHOLECYSTECTOMY;  Surgeon: Jamesetta So, MD;  Location: [facility];  Service: General;  Laterality: N/A;  . HEMICOLECTOMY Right    lap hand assisted  . right shoulder surgery Right 03/2005   rotator cuff repair    No Known Allergies  Current Outpatient Medications on File Prior to Visit  Medication Sig Dispense Refill  . aspirin EC 81 MG tablet Take 1 tablet (81 mg total) by mouth daily. 100 tablet 3  . fluticasone (FLONASE) 50 MCG/ACT nasal spray Place 2 sprays into both nostrils daily. 16 g 3  . tadalafil (CIALIS) 20 MG tablet One tablet every 3 days as needed for erectile function 10 tablet 2  . triamterene-hydrochlorothiazide (MAXZIDE) 75-50 MG tablet  TAKE 1 TABLET BY MOUTH DAILY. 90 tablet 1   No current facility-administered medications on file prior to visit.         Objective:   Physical Exam Blood pressure 120/84, pulse 60, temperature 98.4 F (36.9 C), height '6\' 1"'$  (1.854 m), weight 178 lb 8 oz (81 kg). Alert and oriented. Skin warm and dry. Oral mucosa is moist.   . Sclera anicteric, conjunctivae is pink. Thyroid not enlarged. No cervical lymphadenopathy. Lungs clear. Heart regular rate and rhythm.  Abdomen is soft. Bowel sounds are positive. No hepatomegaly. No abdominal masses felt. No tenderness.  No edema to lower extremities.          Assessment & Plan:  Hepatitis C. He has cleared the virus. He is doing well. Labs and US abdomen OV in 1 year.

## 2017-10-15 NOTE — Patient Instructions (Signed)
OV in 1 year.  

## 2017-10-17 LAB — CBC/DIFF AMBIGUOUS DEFAULT
BASOS: 0 %
Basophils Absolute: 0 10*3/uL (ref 0.0–0.2)
EOS (ABSOLUTE): 0 10*3/uL (ref 0.0–0.4)
EOS: 1 %
Hematocrit: 40.7 % (ref 37.5–51.0)
Hemoglobin: 13.2 g/dL (ref 13.0–17.7)
IMMATURE GRANS (ABS): 0 10*3/uL (ref 0.0–0.1)
IMMATURE GRANULOCYTES: 0 %
LYMPHS: 39 %
Lymphocytes Absolute: 1.7 10*3/uL (ref 0.7–3.1)
MCH: 27.2 pg (ref 26.6–33.0)
MCHC: 32.4 g/dL (ref 31.5–35.7)
MCV: 84 fL (ref 79–97)
Monocytes Absolute: 0.4 10*3/uL (ref 0.1–0.9)
Monocytes: 9 %
NEUTROS PCT: 51 %
Neutrophils Absolute: 2.2 10*3/uL (ref 1.4–7.0)
PLATELETS: 256 10*3/uL (ref 150–379)
RBC: 4.85 x10E6/uL (ref 4.14–5.80)
RDW: 14.1 % (ref 12.3–15.4)
WBC: 4.3 10*3/uL (ref 3.4–10.8)

## 2017-10-17 LAB — HCV RNA QUANT: Hepatitis C Quantitation: NOT DETECTED IU/mL

## 2017-10-17 LAB — HEPATIC FUNCTION PANEL
ALBUMIN: 4.4 g/dL (ref 3.6–4.8)
ALT: 23 IU/L (ref 0–44)
AST: 29 IU/L (ref 0–40)
Alkaline Phosphatase: 51 IU/L (ref 39–117)
BILIRUBIN, DIRECT: 0.21 mg/dL (ref 0.00–0.40)
Bilirubin Total: 1.1 mg/dL (ref 0.0–1.2)
Total Protein: 7.2 g/dL (ref 6.0–8.5)

## 2017-10-17 LAB — SPECIMEN STATUS REPORT

## 2017-10-17 LAB — AFP TUMOR MARKER: AFP, SERUM, TUMOR MARKER: 5 ng/mL (ref 0.0–8.3)

## 2017-10-20 ENCOUNTER — Telehealth: Payer: Self-pay | Admitting: Family Medicine

## 2017-10-20 ENCOUNTER — Encounter: Payer: Self-pay | Admitting: Family Medicine

## 2017-10-20 ENCOUNTER — Ambulatory Visit (INDEPENDENT_AMBULATORY_CARE_PROVIDER_SITE_OTHER): Payer: Medicare Other | Admitting: Family Medicine

## 2017-10-20 ENCOUNTER — Ambulatory Visit (HOSPITAL_COMMUNITY)
Admission: RE | Admit: 2017-10-20 | Discharge: 2017-10-20 | Disposition: A | Discharge status: Home / Self Care | Payer: Medicare Other | Source: Ambulatory Visit | Attending: Family Medicine | Admitting: Family Medicine

## 2017-10-20 ENCOUNTER — Ambulatory Visit (HOSPITAL_COMMUNITY)
Admission: RE | Admit: 2017-10-20 | Discharge: 2017-10-20 | Disposition: A | Discharge status: Home / Self Care | Payer: Medicare Other | Source: Ambulatory Visit | Attending: Internal Medicine | Admitting: Internal Medicine

## 2017-10-20 VITALS — BP 128/80 | HR 68 | Resp 16 | Ht 73.0 in | Wt 180.0 lb

## 2017-10-20 DIAGNOSIS — M25571 Pain in right ankle and joints of right foot: Secondary | ICD-10-CM

## 2017-10-20 DIAGNOSIS — M79661 Pain in right lower leg: Secondary | ICD-10-CM | POA: Diagnosis not present

## 2017-10-20 DIAGNOSIS — M79604 Pain in right leg: Secondary | ICD-10-CM | POA: Insufficient documentation

## 2017-10-20 DIAGNOSIS — S99911A Unspecified injury of right ankle, initial encounter: Secondary | ICD-10-CM | POA: Diagnosis not present

## 2017-10-20 DIAGNOSIS — I10 Essential (primary) hypertension: Secondary | ICD-10-CM

## 2017-10-20 DIAGNOSIS — S8991XA Unspecified injury of right lower leg, initial encounter: Secondary | ICD-10-CM | POA: Diagnosis not present

## 2017-10-20 DIAGNOSIS — M7989 Other specified soft tissue disorders: Secondary | ICD-10-CM | POA: Diagnosis not present

## 2017-10-20 DIAGNOSIS — B182 Chronic viral hepatitis C: Secondary | ICD-10-CM | POA: Insufficient documentation

## 2017-10-20 DIAGNOSIS — Z9049 Acquired absence of other specified parts of digestive tract: Secondary | ICD-10-CM | POA: Insufficient documentation

## 2017-10-20 DIAGNOSIS — N528 Other male erectile dysfunction: Secondary | ICD-10-CM | POA: Diagnosis not present

## 2017-10-20 NOTE — Patient Instructions (Signed)
F/u as before, call if you need me sooner   I believe that you have a sprain/ soft tissue injury, and will need to see orthopedics and are referred   Please get [facility] today, we will contact you

## 2017-10-20 NOTE — Telephone Encounter (Signed)
Pt is sitting in our waiting room, requesting that we send over a order for an xray as he has hurt his foothiu

## 2017-10-20 NOTE — Progress Notes (Signed)
   Bryce Martinez     MRN: [id number]      DOB: Mar 24, 1951   HPI Bryce Martinez is here forright leg pain and swelling x 10 days following direct traUMA FROM A TREE BRANCH MOST  Localized to area of contact,he is concerned as the pain and swelling persist  ROS Denies recent fever or chills. Denies sinus pressure, nasal congestion, ear pain or sore throat. Denies chest congestion, productive cough or wheezing. Denies chest pains, palpitations and leg swelling Denies abdominal pain, nausea, vomiting,diarrhea or constipation.   Denies dysuria, frequency, hesitancy or incontinence. . Denies headaches, seizures, numbness, or tingling. Denies depression, anxiety or insomnia. Denies skin break down or rash.   PE  BP 128/80   Pulse 68   Resp 16   Ht 6\' 1"  (1.854 m)   Wt 180 lb (81.6 kg)   SpO2 98%   BMI 23.75 kg/m   Pt in pain and unable to weight bear on right foot  fully Patient alert and oriented and in no cardiopulmonary distress.  HEENT: No facial asymmetry, EOMI,   oropharynx pink and moist.  Neck supple no JVD, no mass.  Chest: Clear to auscultation bilaterally.  CVS: S1, S2 no murmurs, no S3.Regular rate.  ABD: Soft non tender.   Ext: No edema  MS: Adequate ROM spine, shoulders, hips and knees.Decreased ROM right ankle, localized tenderness on right leg lateral aspect, swelling of right lower extremity with discoloration/ bruising  Skin: Intact, no ulcerations or rash noted.  Psych: Good eye contact, normal affect. Memory intact not anxious or depressed appearing.  CNS: CN 2-12 intact, power,  normal throughout.no focal deficits noted.   Assessment & Plan Ankle pain, right Acute right ankle pain s/p direct trauma 10 days ago with limitation in movement and swelling, and inability to fully weight bear Will obtain x ray, I suspect this is a sprain and will need PT and partial immobilization/ non weight bearing for 6 to 8 weeks, will refer to ortho Tylenol and  ibuprofen for pain  Leg pain, lateral, right Localized lateral right leg pain following direct trauma x 10 days with bruising an swelling, x ray of affected area and ortho referral, clinical impression is soft tissue injury only  Essential hypertension Controlled, no change in medication   ED (erectile dysfunction) Controlled, no change in medication

## 2017-10-20 NOTE — Telephone Encounter (Signed)
Right lower leg pain from a fallen branch. Swollen and sore, bruised. Wants an xray ordered of right lower leg so he doesn't have to wait at the ER. Please advise

## 2017-10-20 NOTE — Telephone Encounter (Signed)
Patient seen In office

## 2017-10-22 ENCOUNTER — Encounter: Payer: Self-pay | Admitting: Family Medicine

## 2017-10-25 NOTE — Assessment & Plan Note (Addendum)
Acute right ankle pain s/p direct trauma 10 days ago with limitation in movement and swelling, and inability to fully weight bear Will obtain x ray, I suspect this is a sprain and will need PT and partial immobilization/ non weight bearing for 6 to 8 weeks, will refer to ortho Tylenol and ibuprofen for pain

## 2017-10-25 NOTE — Assessment & Plan Note (Signed)
Localized lateral right leg pain following direct trauma x 10 days with bruising an swelling, x ray of affected area and ortho referral, clinical impression is soft tissue injury only

## 2017-10-25 NOTE — Assessment & Plan Note (Signed)
Controlled, no change in medication  

## 2017-10-25 NOTE — Progress Notes (Signed)
   Bryce Martinez     MRN: [id number]      DOB: 01/27/1951   HPI Bryce Martinez is here forright leg pain and swelling x 10 days following direct traUMA FROM A TREE BRANCH MOST  Localized to area of contact,he is concerned as the pain and swelling persist  ROS Denies recent fever or chills. Denies sinus pressure, nasal congestion, ear pain or sore throat. Denies chest congestion, productive cough or wheezing. Denies chest pains, palpitations and leg swelling Denies abdominal pain, nausea, vomiting,diarrhea or constipation.   Denies dysuria, frequency, hesitancy or incontinence. . Denies headaches, seizures, numbness, or tingling. Denies depression, anxiety or insomnia. Denies skin break down or rash.   PE  BP 128/80   Pulse 68   Resp 16   Ht 6\' 1"  (1.854 m)   Wt 180 lb (81.6 kg)   SpO2 98%   BMI 23.75 kg/m   Pt in pain and unable to weight bear on right foot  fully Patient alert and oriented and in no cardiopulmonary distress.  HEENT: No facial asymmetry, EOMI,   oropharynx pink and moist.  Neck supple no JVD, no mass.  Chest: Clear to auscultation bilaterally.  CVS: S1, S2 no murmurs, no S3.Regular rate.  ABD: Soft non tender.   Ext: No edema  MS: Adequate ROM spine, shoulders, hips and knees.Decreased ROM right ankle, localized tenderness on right leg lateral aspect, swelling of right lower extremity with discoloration/ bruising  Skin: Intact, no ulcerations or rash noted.  Psych: Good eye contact, normal affect. Memory intact not anxious or depressed appearing.  CNS: CN 2-12 intact, power,  normal throughout.no focal deficits noted.   Assessment & Plan Ankle pain, right Acute right ankle pain s/p direct trauma 10 days ago with limitation in movement and swelling, and inability to fully weight bear Will obtain x ray, I suspect this is a sprain and will need PT and partial immobilization/ non weight bearing for 6 to 8 weeks, will refer to ortho Tylenol and  ibuprofen for pain  Leg pain, lateral, right Localized lateral right leg pain following direct trauma x 10 days with bruising an swelling, x ray of affected area and ortho referral, clinical impression is soft tissue injury only  Essential hypertension Controlled, no change in medication   ED (erectile dysfunction) Controlled, no change in medication

## 2017-11-20 ENCOUNTER — Telehealth: Payer: Self-pay | Admitting: Family Medicine

## 2017-11-20 NOTE — Telephone Encounter (Signed)
lvm to r/s appt due to r/s list

## 2017-11-24 ENCOUNTER — Other Ambulatory Visit: Payer: Self-pay | Admitting: Family Medicine

## 2017-12-02 ENCOUNTER — Ambulatory Visit: Payer: 59 | Admitting: Family Medicine

## 2017-12-04 ENCOUNTER — Ambulatory Visit (INDEPENDENT_AMBULATORY_CARE_PROVIDER_SITE_OTHER): Payer: Medicare Other | Admitting: Family Medicine

## 2017-12-04 ENCOUNTER — Encounter: Payer: Self-pay | Admitting: Family Medicine

## 2017-12-04 VITALS — BP 130/78 | HR 82 | Resp 15 | Ht 73.0 in | Wt 180.0 lb

## 2017-12-04 DIAGNOSIS — E785 Hyperlipidemia, unspecified: Secondary | ICD-10-CM

## 2017-12-04 DIAGNOSIS — Z125 Encounter for screening for malignant neoplasm of prostate: Secondary | ICD-10-CM

## 2017-12-04 DIAGNOSIS — I10 Essential (primary) hypertension: Secondary | ICD-10-CM

## 2017-12-04 DIAGNOSIS — R7302 Impaired glucose tolerance (oral): Secondary | ICD-10-CM

## 2017-12-04 DIAGNOSIS — N528 Other male erectile dysfunction: Secondary | ICD-10-CM

## 2017-12-04 DIAGNOSIS — Z1211 Encounter for screening for malignant neoplasm of colon: Secondary | ICD-10-CM

## 2017-12-04 LAB — POC HEMOCCULT BLD/STL (OFFICE/1-CARD/DIAGNOSTIC): Fecal Occult Blood, POC: NEGATIVE

## 2017-12-04 NOTE — Patient Instructions (Addendum)
F/u end August , call if you need me sooner   Excellent lifestyle and exam, keep up the good work  Cut back on fried and fatty foods  As you are doing  Fasting labs in 5 months  Thank you  for choosing [facility]. We consider it a privelige to serve you.  Delivering excellent health care in a caring and  compassionate way is our goal.  Partnering with you,  so that together we can achieve this goal is our strategy.

## 2017-12-07 ENCOUNTER — Encounter: Payer: Self-pay | Admitting: Family Medicine

## 2017-12-07 NOTE — Assessment & Plan Note (Signed)
Hyperlipidemia:Low fat diet discussed and encouraged.   Lipid Panel  Lab Results  Component Value Date   CHOL 225 (H) 09/16/2017   HDL 61 09/16/2017   LDLCALC 144 (H) 09/16/2017   TRIG 98 09/16/2017   CHOLHDL 3.7 09/16/2017   uncontrolled Updated lab needed at/ before next visit.

## 2017-12-07 NOTE — Assessment & Plan Note (Signed)
Controlled, no change in medication DASH diet and commitment to daily physical activity for a minimum of 30 minutes discussed and encouraged, as a part of hypertension management. The importance of attaining a healthy weight is also discussed.  BP/Weight 12/04/2017 10/20/2017 10/15/2017 09/22/2017 01/08/2017 10/15/2016 5/37/9432  Systolic BP 761 470 929 574 734 037 096  Diastolic BP 78 80 84 82 82 82 82  Wt. (Lbs) 180 180 178.5 180.12 183 181.8 173  BMI 23.75 23.75 23.55 23.76 24.14 23.99 22.82

## 2017-12-07 NOTE — Assessment & Plan Note (Signed)
Controlled, no change in medication  

## 2017-12-07 NOTE — Progress Notes (Signed)
   Bryce Martinez     MRN: [id number]      DOB: [birth year]   HPI [name] is here for follow up and re-evaluation of chronic medical conditions, medication management and review of any available recent lab and radiology data.  Preventive health is updated, specifically  Cancer screening and Immunization.   Questions or concerns regarding consultations or procedures which the PT has had in the interim are  addressed. The PT denies any adverse reactions to current medications since the last visit.  There are no new concerns.  There are no specific complaints   ROS Denies recent fever or chills. Denies sinus pressure, nasal congestion, ear pain or sore throat. Denies chest congestion, productive cough or wheezing. Denies chest pains, palpitations and leg swelling Denies abdominal pain, nausea, vomiting,diarrhea or constipation.   Denies dysuria, frequency, hesitancy or incontinence. Denies joint pain, swelling and limitation in mobility. Denies headaches, seizures, numbness, or tingling. Denies depression, anxiety or insomnia. Denies skin break down or rash.   PE  BP 130/78   Pulse 82   Resp 15   Ht 6\' 1"  (1.854 m)   Wt 180 lb (81.6 kg)   SpO2 98%   BMI 23.75 kg/m   Patient alert and oriented and in no cardiopulmonary distress.  HEENT: No facial asymmetry, EOMI,   oropharynx pink and moist.  Neck supple no JVD, no mass.  Chest: Clear to auscultation bilaterally.  CVS: S1, S2 no murmurs, no S3.Regular rate.  ABD: Soft non tender.   Ext: No edema  MS: Adequate ROM spine, shoulders, hips and knees.  Skin: Intact, no ulcerations or rash noted.  Psych: Good eye contact, normal affect. Memory intact not anxious or depressed appearing.  CNS: CN 2-12 intact, power,  normal throughout.no focal deficits noted.   Assessment & Plan  .Essential hypertension Controlled, no change in medication DASH diet and commitment to daily physical activity for a minimum of 30  minutes discussed and encouraged, as a part of hypertension management. The importance of attaining a healthy weight is also discussed.  BP/Weight 12/04/2017 10/20/2017 10/15/2017 09/22/2017 01/08/2017 10/15/2016 3/41/9379  Systolic BP 024 097 353 299 242 683 419  Diastolic BP 78 80 84 82 82 82 82  Wt. (Lbs) 180 180 178.5 180.12 183 181.8 173  BMI 23.75 23.75 23.55 23.76 24.14 23.99 22.82       Dyslipidemia Hyperlipidemia:Low fat diet discussed and encouraged.   Lipid Panel  Lab Results  Component Value Date   CHOL 225 (H) 09/16/2017   HDL 61 09/16/2017   LDLCALC 144 (H) 09/16/2017   TRIG 98 09/16/2017   CHOLHDL 3.7 09/16/2017   uncontrolled Updated lab needed at/ before next visit.     ED (erectile dysfunction) Controlled, no change in medication

## 2018-05-19 ENCOUNTER — Other Ambulatory Visit: Payer: Self-pay | Admitting: Family Medicine

## 2018-05-20 DIAGNOSIS — E785 Hyperlipidemia, unspecified: Secondary | ICD-10-CM | POA: Diagnosis not present

## 2018-05-20 DIAGNOSIS — Z125 Encounter for screening for malignant neoplasm of prostate: Secondary | ICD-10-CM | POA: Diagnosis not present

## 2018-05-20 DIAGNOSIS — I10 Essential (primary) hypertension: Secondary | ICD-10-CM | POA: Diagnosis not present

## 2018-06-03 ENCOUNTER — Other Ambulatory Visit: Payer: Self-pay

## 2018-06-03 ENCOUNTER — Ambulatory Visit (INDEPENDENT_AMBULATORY_CARE_PROVIDER_SITE_OTHER): Payer: Medicare Other | Admitting: Family Medicine

## 2018-06-03 ENCOUNTER — Encounter: Payer: Self-pay | Admitting: Family Medicine

## 2018-06-03 VITALS — BP 152/76 | HR 54 | Resp 12 | Ht 73.0 in | Wt 172.0 lb

## 2018-06-03 DIAGNOSIS — I10 Essential (primary) hypertension: Secondary | ICD-10-CM | POA: Diagnosis not present

## 2018-06-03 DIAGNOSIS — N528 Other male erectile dysfunction: Secondary | ICD-10-CM | POA: Diagnosis not present

## 2018-06-03 DIAGNOSIS — E785 Hyperlipidemia, unspecified: Secondary | ICD-10-CM | POA: Diagnosis not present

## 2018-06-03 DIAGNOSIS — J301 Allergic rhinitis due to pollen: Secondary | ICD-10-CM

## 2018-06-03 DIAGNOSIS — H9190 Unspecified hearing loss, unspecified ear: Secondary | ICD-10-CM | POA: Insufficient documentation

## 2018-06-03 DIAGNOSIS — H918X1 Other specified hearing loss, right ear: Secondary | ICD-10-CM | POA: Diagnosis not present

## 2018-06-03 MED ORDER — TADALAFIL 20 MG PO TABS: ORAL_TABLET | ORAL | 1 refills | Status: DC

## 2018-06-03 MED ORDER — AMLODIPINE BESYLATE 2.5 MG PO TABS: 2.5000 mg | ORAL_TABLET | Freq: Every day | ORAL | 3 refills | Status: DC

## 2018-06-03 NOTE — Patient Instructions (Addendum)
Wellness visit nurse  In 2 months, call if you need me before  PSA and non fast chem 7 qnd EGFR first week in September( [facility]_)  ND appt mid January   Labs are better, still reduce fatty foods  BP needs help. New additional med is anmlodipine 2.5 mg  one  At night 9 pm  For right ear muffling, use allergy medication daily for next 10 to 14 days and OTC sudafed tablet one every day.May try to flush the scant amt of wax from ear at home, NO Q TIPS If this does not clear up the problem call for referral for ear exam by ENT

## 2018-06-03 NOTE — Assessment & Plan Note (Signed)
Current flare with right ear discomfort, he is to commit to daily allergy medication and decongestant once daily for next 2 weeks . If still  Has hearing disturbance he is to call for ENT referral

## 2018-06-03 NOTE — Assessment & Plan Note (Signed)
Uncontrolled, add amlodipine 2.5 mg I one at bedtime, continue maxzide as before DASH diet and commitment to daily physical activity for a minimum of 30 minutes discussed and encouraged, as a part of hypertension management. The importance of attaining a healthy weight is also discussed.  BP/Weight 06/03/2018 12/04/2017 10/20/2017 10/15/2017 09/22/2017 01/08/2017 75/64/3329  Systolic BP 518 841 660 630 160 109 323  Diastolic BP 76 78 80 84 82 82 82  Wt. (Lbs) 172.04 180 180 178.5 180.12 183 181.8  BMI 22.7 23.75 23.75 23.55 23.76 24.14 23.99

## 2018-06-03 NOTE — Assessment & Plan Note (Signed)
Unchanged, prescription handed to patient to send for medication as before Cialis 20 mg

## 2018-06-03 NOTE — Assessment & Plan Note (Signed)
Trial of aggressive treatment of uncontrolled allergy symptoms, if no success in 2 weeks, then pt to call for referral to ENT

## 2018-06-03 NOTE — Assessment & Plan Note (Signed)
Improved, pt applauded on this and encouraged to continue same

## 2018-06-03 NOTE — Progress Notes (Signed)
   Bryce Martinez     MRN: [id number]      DOB: [birth year]   HPI Bryce Martinez is here for follow up and re-evaluation of chronic medical conditions, medication management and review of any available recent lab and radiology data.  Preventive health is updated, specifically  Cancer screening and Immunization. Needs colonoscopy in 2020 . Denies change in bowel movement , reports good appetitie  The PT denies any adverse reactions to current medications since the last visit.  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 Denies sinus pressure, nasal congestion, or sore throat. Denies chest congestion, productive cough or wheezing. Denies chest pains, palpitations and leg swelling Denies abdominal pain, nausea, vomiting,diarrhea or constipation.   Denies dysuria, frequency, hesitancy or incontinence. Denies joint pain, swelling and limitation in mobility. Denies headaches, seizures, numbness, or tingling. Denies depression, anxiety or insomnia. Denies skin break down or rash.   PE  BP (!) 152/76 (BP Location: Left Arm, Patient Position: Sitting, Cuff Size: Large)   Pulse (!) 54   Resp 12   Ht 6\' 1"  (1.854 m)   Wt 172 lb 0.6 oz (78 kg)   SpO2 100%   BMI 22.70 kg/m   Patient alert and oriented and in no cardiopulmonary distress.  HEENT: No facial asymmetry, EOMI,   oropharynx pink and moist.  Neck supple no JVD, no mass. Left TM clear and ext ear canal normal. Right TM partialally occluded by cerumen and mildly dull no erythema  Chest: Clear to auscultation bilaterally.  CVS: S1, S2 no murmurs, no S3.Regular rate.  ABD: Soft non tender.   Ext: No edema  MS: Adequate ROM spine, shoulders, hips  and knees.  Skin: Intact, no ulcerations or rash noted.  Psych: Good eye contact, normal affect. Memory intact not anxious or depressed appearing.  CNS: CN 2-12 intact, power,  normal throughout.no focal deficits noted.   Assessment & Plan  Essential hypertension Uncontrolled, add amlodipine 2.5 mg I one at bedtime, continue maxzide as before DASH diet and commitment to daily physical activity for a minimum of 30 minutes discussed and encouraged, as a part of hypertension management. The importance of attaining a healthy weight is also discussed.  BP/Weight 06/03/2018 12/04/2017 10/20/2017 10/15/2017 09/22/2017 01/08/2017 03/00/9233  Systolic BP 007 622 633 354 562 563 893  Diastolic BP 76 78 80 84 82 82 82  Wt. (Lbs) 172.04 180 180 178.5 180.12 183 181.8  BMI 22.7 23.75 23.75 23.55 23.76 24.14 23.99       Dyslipidemia Improved, pt applauded on this and encouraged to continue same  ED (erectile dysfunction) Unchanged, prescription handed to patient to send for medication as before Cialis 20 mg  Allergic rhinitis Current flare with right ear discomfort, he is to commit to daily allergy medication and decongestant once daily for next 2 weeks . If still  Has hearing disturbance he is to call for ENT referral  Hearing loss Trial of aggressive treatment of uncontrolled allergy symptoms, if no success in 2 weeks, then pt to call for referral to ENT

## 2018-07-31 ENCOUNTER — Other Ambulatory Visit: Payer: Self-pay | Admitting: Family Medicine

## 2018-07-31 DIAGNOSIS — N528 Other male erectile dysfunction: Secondary | ICD-10-CM | POA: Diagnosis not present

## 2018-07-31 DIAGNOSIS — I10 Essential (primary) hypertension: Secondary | ICD-10-CM | POA: Diagnosis not present

## 2018-08-01 LAB — BASIC METABOLIC PANEL
BUN / CREAT RATIO: 14 (ref 10–24)
BUN: 18 mg/dL (ref 8–27)
CO2: 25 mmol/L (ref 20–29)
CREATININE: 1.28 mg/dL — AB (ref 0.76–1.27)
Calcium: 10.1 mg/dL (ref 8.6–10.2)
Chloride: 101 mmol/L (ref 96–106)
GFR calc Af Amer: 67 mL/min/{1.73_m2} (ref >59)
GFR calc non Af Amer: 58 mL/min/{1.73_m2} — ABNORMAL LOW (ref >59)
GLUCOSE: 73 mg/dL (ref 65–99)
POTASSIUM: 3.8 mmol/L (ref 3.5–5.2)
SODIUM: 141 mmol/L (ref 134–144)

## 2018-08-01 LAB — PSA: Prostate Specific Ag, Serum: 0.9 ng/mL (ref 0.0–4.0)

## 2018-08-01 LAB — SPECIMEN STATUS REPORT

## 2018-08-02 ENCOUNTER — Encounter: Payer: Self-pay | Admitting: Family Medicine

## 2018-08-03 ENCOUNTER — Ambulatory Visit (INDEPENDENT_AMBULATORY_CARE_PROVIDER_SITE_OTHER): Payer: Medicare Other

## 2018-08-03 ENCOUNTER — Encounter: Payer: Self-pay | Admitting: Family Medicine

## 2018-08-03 VITALS — BP 122/82 | HR 58 | Resp 15 | Ht 73.0 in | Wt 171.0 lb

## 2018-08-03 DIAGNOSIS — Z0000 Encounter for general adult medical examination without abnormal findings: Secondary | ICD-10-CM

## 2018-08-03 DIAGNOSIS — Z23 Encounter for immunization: Secondary | ICD-10-CM | POA: Diagnosis not present

## 2018-08-03 NOTE — Patient Instructions (Addendum)
Bryce Martinez , Thank you for taking time to come for your Medicare Wellness Visit. I appreciate your ongoing commitment to your health goals. Please review the following plan we discussed and let me know if I can assist you in the future.     Flu vaccine today   Wellness in 1 year  Screening recommendations/referrals: Colonoscopy: up to date  Recommended yearly ophthalmology/optometry visit for glaucoma screening and checkup Recommended yearly dental visit for hygiene and checkup  Vaccinations: Influenza vaccine: given today  Pneumococcal vaccine: up to date  Tdap vaccine: up to date  Shingles vaccine: ask insurance if shingrix is covered     Advanced directives: form given   Conditions/risks identified: done   Next appointment: wellness in 1 year   Preventive Care 41 Years and Older, Male Preventive care refers to lifestyle choices and visits with your health care provider that can promote health and wellness. What does preventive care include?  A yearly physical exam. This is also called an annual well check.  Dental exams once or twice a year.  Routine eye exams. Ask your health care provider how often you should have your eyes checked.  Personal lifestyle choices, including:  Daily care of your teeth and gums.  Regular physical activity.  Eating a healthy diet.  Avoiding tobacco and drug use.  Limiting alcohol use.  Practicing safe sex.  Taking low doses of aspirin every day.  Taking vitamin and mineral supplements as recommended by your health care provider. What happens during an annual well check? The services and screenings done by your health care provider during your annual well check will depend on your age, overall health, lifestyle risk factors, and family history of disease. Counseling  Your health care provider may ask you questions about your:  Alcohol use.  Tobacco use.  Drug use.  Emotional well-being.  Home and relationship  well-being.  Sexual activity.  Eating habits.  History of falls.  Memory and ability to understand (cognition).  Work and work Statistician. Screening  You may have the following tests or measurements:  Height, weight, and BMI.  Blood pressure.  Lipid and cholesterol levels. These may be checked every 5 years, or more frequently if you are over 78 years old.  Skin check.  Lung cancer screening. You may have this screening every year starting at age 64 if you have a 30-pack-year history of smoking and currently smoke or have quit within the past 15 years.  Fecal occult blood test (FOBT) of the stool. You may have this test every year starting at age 16.  Flexible sigmoidoscopy or colonoscopy. You may have a sigmoidoscopy every 5 years or a colonoscopy every 10 years starting at age 32.  Prostate cancer screening. Recommendations will vary depending on your family history and other risks.  Hepatitis C blood test.  Hepatitis B blood test.  Sexually transmitted disease (STD) testing.  Diabetes screening. This is done by checking your blood sugar (glucose) after you have not eaten for a while (fasting). You may have this done every 1-3 years.  Abdominal aortic aneurysm (AAA) screening. You may need this if you are a current or former smoker.  Osteoporosis. You may be screened starting at age 56 if you are at high risk. Talk with your health care provider about your test results, treatment options, and if necessary, the need for more tests. Vaccines  Your health care provider may recommend certain vaccines, such as:  Influenza vaccine. This is recommended every year.  Tetanus, diphtheria, and acellular pertussis (Tdap, Td) vaccine. You may need a Td booster every 10 years.  Zoster vaccine. You may need this after age 41.  Pneumococcal 13-valent conjugate (PCV13) vaccine. One dose is recommended after age 75.  Pneumococcal polysaccharide (PPSV23) vaccine. One dose is  recommended after age 60. Talk to your health care provider about which screenings and vaccines you need and how often you need them. This information is not intended to replace advice given to you by your health care provider. Make sure you discuss any questions you have with your health care provider. Document Released: 11/17/2015 Document Revised: 07/10/2016 Document Reviewed: 08/22/2015 Elsevier Interactive Patient Education  2017 [place] Prevention in the Home Falls can cause injuries. They can happen to people of all ages. There are many things you can do to make your home safe and to help prevent falls. What can I do on the outside of my home?  Regularly fix the edges of walkways and driveways and fix any cracks.  Remove anything that might make you trip as you walk through a door, such as a raised step or threshold.  Trim any bushes or trees on the path to your home.  Use bright outdoor lighting.  Clear any walking paths of anything that might make someone trip, such as rocks or tools.  Regularly check to see if handrails are loose or broken. Make sure that both sides of any steps have handrails.  Any raised decks and porches should have guardrails on the edges.  Have any leaves, snow, or ice cleared regularly.  Use sand or salt on walking paths during winter.  Clean up any spills in your garage right away. This includes oil or grease spills. What can I do in the bathroom?  Use night lights.  Install grab bars by the toilet and in the tub and shower. Do not use towel bars as grab bars.  Use non-skid mats or decals in the tub or shower.  If you need to sit down in the shower, use a plastic, non-slip stool.  Keep the floor dry. Clean up any water that spills on the floor as soon as it happens.  Remove soap buildup in the tub or shower regularly.  Attach bath mats securely with double-sided non-slip rug tape.  Do not have throw rugs and other things on  the floor that can make you trip. What can I do in the bedroom?  Use night lights.  Make sure that you have a light by your bed that is easy to reach.  Do not use any sheets or blankets that are too big for your bed. They should not hang down onto the floor.  Have a firm chair that has side arms. You can use this for support while you get dressed.  Do not have throw rugs and other things on the floor that can make you trip. What can I do in the kitchen?  Clean up any spills right away.  Avoid walking on wet floors.  Keep items that you use a lot in easy-to-reach places.  If you need to reach something above you, use a strong step stool that has a grab bar.  Keep electrical cords out of the way.  Do not use floor polish or wax that makes floors slippery. If you must use wax, use non-skid floor wax.  Do not have throw rugs and other things on the floor that can make you trip. What can I do  with my stairs?  Do not leave any items on the stairs.  Make sure that there are handrails on both sides of the stairs and use them. Fix handrails that are broken or loose. Make sure that handrails are as long as the stairways.  Check any carpeting to make sure that it is firmly attached to the stairs. Fix any carpet that is loose or worn.  Avoid having throw rugs at the top or bottom of the stairs. If you do have throw rugs, attach them to the floor with carpet tape.  Make sure that you have a light switch at the top of the stairs and the bottom of the stairs. If you do not have them, ask someone to add them for you. What else can I do to help prevent falls?  Wear shoes that:  Do not have high heels.  Have rubber bottoms.  Are comfortable and fit you well.  Are closed at the toe. Do not wear sandals.  If you use a stepladder:  Make sure that it is fully opened. Do not climb a closed stepladder.  Make sure that both sides of the stepladder are locked into place.  Ask someone to  hold it for you, if possible.  Clearly mark and make sure that you can see:  Any grab bars or handrails.  First and last steps.  Where the edge of each step is.  Use tools that help you move around (mobility aids) if they are needed. These include:  Canes.  Walkers.  Scooters.  Crutches.  Turn on the lights when you go into a dark area. Replace any light bulbs as soon as they burn out.  Set up your furniture so you have a clear path. Avoid moving your furniture around.  If any of your floors are uneven, fix them.  If there are any pets around you, be aware of where they are.  Review your medicines with your doctor. Some medicines can make you feel dizzy. This can increase your chance of falling. Ask your doctor what other things that you can do to help prevent falls. This information is not intended to replace advice given to you by your health care provider. Make sure you discuss any questions you have with your health care provider. Document Released: 08/17/2009 Document Revised: 03/28/2016 Document Reviewed: 11/25/2014 Elsevier Interactive Patient Education  2017 [name].

## 2018-08-03 NOTE — Progress Notes (Signed)
Subjective:   Bryce Martinez is a 67 y.o. male who presents for Medicare Annual/Subsequent preventive examination.  Review of Systems:   Cardiac Risk Factors include: advanced age (>21men, >2 women);dyslipidemia;hypertension;male gender     Objective:    Vitals: BP 122/82   Pulse (!) 58   Resp 15   Ht 6\' 1"  (1.854 m)   Wt 171 lb (77.6 kg)   SpO2 100%   BMI 22.56 kg/m   Body mass index is 22.56 kg/m.  Advanced Directives 08/03/2018 07/10/2015 04/19/2014  Does Patient Have a Medical Advance Directive? Yes No Patient does not have advance directive;Patient would not like information  Does patient want to make changes to medical advance directive? Yes (ED - Information included in AVS) - -  Pre-existing out of facility DNR order (yellow form or pink MOST form) - - No    Tobacco Social History   Tobacco Use  Smoking Status Former Smoker  . Packs/day: 0.25  . Years: 15.00  . Pack years: 3.75  . Types: Cigarettes  . Last attempt to quit: 04/19/1972  . Years since quitting: 46.3  Smokeless Tobacco Never Used     Counseling given: Not Answered   Clinical Intake:  Pre-visit preparation completed: Yes  Pain : No/denies pain Pain Score: 0-No pain     Nutritional Status: BMI of 19-24  Normal Diabetes: No  How often do you need to have someone help you when you read instructions, pamphlets, or other written materials from your doctor or pharmacy?: 1 - Never What is the last grade level you completed in school?: college degree     Information entered by :: Litisha Guagliardo LPN   Past Medical History:  Diagnosis Date  . Allergy   . Arthritis   . ED (erectile dysfunction)   . Hepatitis C   . Hyperlipidemia   . Hypertension    Past Surgical History:  Procedure Laterality Date  . APPENDECTOMY    . CHOLECYSTECTOMY N/A 04/25/2014   Procedure: LAPAROSCOPIC CHOLECYSTECTOMY;  Surgeon: Jamesetta So, MD;  Location: [facility];  Service: General;  Laterality: N/A;  .  HEMICOLECTOMY Right    lap hand assisted  . right shoulder surgery Right 03/2005   rotator cuff repair   Family History  Problem Relation Age of Onset  . Hypertension Mother   . Cancer Father        colon   . Hypertension Sister   . Hypertension Brother   . Diabetes Brother   . Hypertension Daughter   . Hypertension Son    Social History   Socioeconomic History  . Marital status: Married    Spouse name: Not on file  . Number of children: Not on file  . Years of education: Not on file  . Highest education level: Not on file  Occupational History  . Not on file  Social Needs  . Financial resource strain: Not on file  . Food insecurity:    Worry: Not on file    Inability: Not on file  . Transportation needs:    Medical: Not on file    Non-medical: Not on file  Tobacco Use  . Smoking status: Former Smoker    Packs/day: 0.25    Years: 15.00    Pack years: 3.75    Types: Cigarettes    Last attempt to quit: 04/19/1972    Years since quitting: 46.3  . Smokeless tobacco: Never Used  Substance and Sexual Activity  . Alcohol use: No  Alcohol/week: 0.0 standard drinks  . Drug use: No  . Sexual activity: Yes    Birth control/protection: None  Lifestyle  . Physical activity:    Days per week: Not on file    Minutes per session: Not on file  . Stress: Not on file  Relationships  . Social connections:    Talks on phone: Not on file    Gets together: Not on file    Attends religious service: Not on file    Active member of club or organization: Not on file    Attends meetings of clubs or organizations: Not on file    Relationship status: Not on file  Other Topics Concern  . Not on file  Social History Narrative  . Not on file    Outpatient Encounter Medications as of 08/03/2018  Medication Sig  . amLODipine (NORVASC) 2.5 MG tablet Take 1 tablet (2.5 mg total) by mouth daily.  Marland Kitchen aspirin EC 81 MG tablet Take 1 tablet (81 mg total) by mouth daily.  . fluticasone  (FLONASE) 50 MCG/ACT nasal spray Place 2 sprays into both nostrils daily.  . tadalafil (ADCIRCA/CIALIS) 20 MG tablet One tablet every 3 days as needed for erectile funmction  . triamterene-hydrochlorothiazide (MAXZIDE) 75-50 MG tablet TAKE 1 TABLET BY MOUTH DAILY.   No facility-administered encounter medications on file as of 08/03/2018.     Activities of Daily Living In your present state of health, do you have any difficulty performing the following activities: 08/03/2018  Hearing? N  Vision? N  Difficulty concentrating or making decisions? N  Walking or climbing stairs? N  Dressing or bathing? N  Doing errands, shopping? N  Preparing Food and eating ? N  Using the Toilet? N  In the past six months, have you accidently leaked urine? N  Do you have problems with loss of bowel control? N  Managing your Medications? N  Managing your Finances? N  Housekeeping or managing your Housekeeping? N  Some recent data might be hidden    Patient Care Team: Fayrene Helper, MD as PCP - General   Assessment:   This is a routine wellness examination for Bryce Martinez.  Exercise Activities and Dietary recommendations Current Exercise Habits: Home exercise routine, Time (Minutes): 60, Frequency (Times/Week): 5, Weekly Exercise (Minutes/Week): 300, Intensity: Moderate  Goals   None     Fall Risk Fall Risk  08/03/2018 08/03/2018 06/03/2018 12/04/2017 09/22/2017  Falls in the past year? No No No No No   Is the patient's home free of loose throw rugs in walkways, pet beds, electrical cords, etc?   yes      Grab bars in the bathroom? yes      Handrails on the stairs?   yes      Adequate lighting?   yes  Timed Get Up and Go Performed:   Depression Screen PHQ 2/9 Scores 08/03/2018 08/03/2018 06/03/2018 09/22/2017  PHQ - 2 Score 0 0 0 0  PHQ- 9 Score - - - -    Cognitive Function     6CIT Screen 08/03/2018  What time? 0 points  Count back from 20 0 points  Months in reverse 0 points  Repeat  phrase 0 points    Immunization History  Administered Date(s) Administered  . Influenza,inj,Quad PF,6+ Mos 08/16/2014, 08/24/2015, 07/24/2016, 08/03/2018  . Pneumococcal Conjugate-13 09/22/2017  . Td 06/14/2004  . Tdap 10/10/2014  . Zoster 07/22/2012    Qualifies for Shingles Vaccine? Ask insurance if covered   Screening  Tests Health Maintenance  Topic Date Due  . PNA vac Low Risk Adult (2 of 2 - PPSV23) 09/22/2018  . COLONOSCOPY  07/28/2019  . TETANUS/TDAP  10/10/2024  . INFLUENZA VACCINE  Completed  . Hepatitis C Screening  Completed   Cancer Screenings: Lung: Low Dose CT Chest recommended if Age 74-80 years, 30 pack-year currently smoking OR have quit w/in 15years. Patient does not qualify. Colorectal: up to date   Additional Screenings:  Hepatitis C Screening: done       Plan:     I have personally reviewed and noted the following in the patient's chart:   . Medical and social history . Use of alcohol, tobacco or illicit drugs  . Current medications and supplements . Functional ability and status . Nutritional status . Physical activity . Advanced directives . List of other physicians . Hospitalizations, surgeries, and ER visits in previous 12 months . Vitals . Screenings to include cognitive, depression, and falls . Referrals and appointments  In addition, I have reviewed and discussed with patient certain preventive protocols, quality metrics, and best practice recommendations. A written personalized care plan for preventive services as well as general preventive health recommendations were provided to patient.     Kate Sable, LPN, LPN  6/81/5947

## 2018-08-31 ENCOUNTER — Other Ambulatory Visit: Payer: Self-pay | Admitting: Family Medicine

## 2018-10-15 ENCOUNTER — Ambulatory Visit (INDEPENDENT_AMBULATORY_CARE_PROVIDER_SITE_OTHER): Payer: Medicare Other | Admitting: Internal Medicine

## 2018-10-15 ENCOUNTER — Encounter (INDEPENDENT_AMBULATORY_CARE_PROVIDER_SITE_OTHER): Payer: Self-pay | Admitting: Internal Medicine

## 2018-10-15 ENCOUNTER — Encounter (INDEPENDENT_AMBULATORY_CARE_PROVIDER_SITE_OTHER): Payer: Self-pay | Admitting: *Deleted

## 2018-10-15 VITALS — BP 132/80 | HR 60 | Temp 98.0°F | Ht 73.0 in | Wt 169.3 lb

## 2018-10-15 DIAGNOSIS — B182 Chronic viral hepatitis C: Secondary | ICD-10-CM

## 2018-10-15 NOTE — Progress Notes (Signed)
   Subjective:    Patient ID: Bryce Martinez, male    DOB: [birth year], 67 y.o.   MRN: [id number]  HPI Here today for f/u. Last seen in December of 2018 Hx of Hepatitis C successfully treated in 2016. Genotype 2.  Last viral load in December of 2018 was undetected.AFP tumor marker 5.0.  He tells me he is doing good. He is working part-time at a [employer] His appetite is good. No weight loss. BMs normal. No melena or BRRB.  10/20/2017 [place] RUQ:  IMPRESSION: Normal appearance of the liver and common bile duct. Previous cholecystectomy.  Review of Systems Past Medical History:  Diagnosis Date  . Allergy   . Arthritis   . ED (erectile dysfunction)   . Hepatitis C   . Hyperlipidemia   . Hypertension     Past Surgical History:  Procedure Laterality Date  . APPENDECTOMY    . CHOLECYSTECTOMY N/A 04/25/2014   Procedure: LAPAROSCOPIC CHOLECYSTECTOMY;  Surgeon: Jamesetta So, MD;  Location: [facility];  Service: General;  Laterality: N/A;  . HEMICOLECTOMY Right    lap hand assisted  . right shoulder surgery Right 03/2005   rotator cuff repair    No Known Allergies  Current Outpatient Medications on File Prior to Visit  Medication Sig Dispense Refill  . amLODipine (NORVASC) 2.5 MG tablet Take 1 tablet (2.5 mg total) by mouth daily. 90 tablet 3  . aspirin EC 81 MG tablet Take 1 tablet (81 mg total) by mouth daily. 100 tablet 3  . fluticasone (FLONASE) 50 MCG/ACT nasal spray Place 2 sprays into both nostrils daily. 16 g 3  . tadalafil (ADCIRCA/CIALIS) 20 MG tablet One tablet every 3 days as needed for erectile funmction 15 tablet 1  . triamterene-hydrochlorothiazide (MAXZIDE) 75-50 MG tablet TAKE 1 TABLET BY MOUTH DAILY. 90 tablet 1   No current facility-administered medications on file prior to visit.         Objective:   Physical Exam Blood pressure 132/80, pulse 60, temperature 98 F (36.7 C), height 6\' 1"  (1.854 m), weight 169 lb 4.8 oz (76.8 kg). Alert and oriented. Skin  warm and dry. Oral mucosa is moist.   . Sclera anicteric, conjunctivae is pink. Thyroid not enlarged. No cervical lymphadenopathy. Lungs clear. Heart regular rate and rhythm.  Abdomen is soft. Bowel sounds are positive. No hepatomegaly. No abdominal masses felt. No tenderness.  No edema to lower extremities.          Assessment & Plan:  Hepatitis C carrier state. Negative for viral load. Will get [place] RUQ and AFP for surveillance. OV in 1 year OV in 1 year.

## 2018-10-15 NOTE — Patient Instructions (Signed)
OV in 1 year.  

## 2018-10-22 ENCOUNTER — Other Ambulatory Visit (INDEPENDENT_AMBULATORY_CARE_PROVIDER_SITE_OTHER): Payer: Self-pay | Admitting: Internal Medicine

## 2018-10-22 DIAGNOSIS — B182 Chronic viral hepatitis C: Secondary | ICD-10-CM | POA: Diagnosis not present

## 2018-10-23 LAB — AFP TUMOR MARKER: AFP, Serum, Tumor Marker: 4.8 ng/mL (ref 0.0–8.3)

## 2018-10-26 ENCOUNTER — Ambulatory Visit (HOSPITAL_COMMUNITY)
Admission: RE | Admit: 2018-10-26 | Discharge: 2018-10-26 | Disposition: A | Discharge status: Home / Self Care | Payer: Medicare Other | Source: Ambulatory Visit | Attending: Internal Medicine | Admitting: Internal Medicine

## 2018-10-26 DIAGNOSIS — B182 Chronic viral hepatitis C: Secondary | ICD-10-CM | POA: Insufficient documentation

## 2018-11-03 ENCOUNTER — Encounter: Payer: Self-pay | Admitting: *Deleted

## 2018-11-16 ENCOUNTER — Ambulatory Visit (INDEPENDENT_AMBULATORY_CARE_PROVIDER_SITE_OTHER): Payer: Medicare Other | Admitting: Family Medicine

## 2018-11-16 ENCOUNTER — Encounter: Payer: Self-pay | Admitting: Family Medicine

## 2018-11-16 VITALS — BP 128/70 | HR 64 | Resp 12 | Ht 73.0 in | Wt 174.1 lb

## 2018-11-16 DIAGNOSIS — J301 Allergic rhinitis due to pollen: Secondary | ICD-10-CM

## 2018-11-16 DIAGNOSIS — Z23 Encounter for immunization: Secondary | ICD-10-CM | POA: Diagnosis not present

## 2018-11-16 DIAGNOSIS — I10 Essential (primary) hypertension: Secondary | ICD-10-CM | POA: Diagnosis not present

## 2018-11-16 DIAGNOSIS — E785 Hyperlipidemia, unspecified: Secondary | ICD-10-CM

## 2018-11-16 DIAGNOSIS — R7302 Impaired glucose tolerance (oral): Secondary | ICD-10-CM | POA: Diagnosis not present

## 2018-11-16 DIAGNOSIS — N528 Other male erectile dysfunction: Secondary | ICD-10-CM | POA: Diagnosis not present

## 2018-11-16 DIAGNOSIS — Z1211 Encounter for screening for malignant neoplasm of colon: Secondary | ICD-10-CM | POA: Diagnosis not present

## 2018-11-16 MED ORDER — TADALAFIL 20 MG PO TABS: ORAL_TABLET | ORAL | 3 refills | Status: DC

## 2018-11-16 MED ORDER — FLUTICASONE PROPIONATE 50 MCG/ACT NA SUSP: 2.0000 | Freq: Every day | NASAL | 3 refills | Status: DC

## 2018-11-16 NOTE — Patient Instructions (Addendum)
Wellness with nurse to be scheduled first week in October  MD follow up to be scheduled fo March 2021,  Call if you need me sooner  Commitment to healthy food choice, regular exercise and adequate rest needs to continue  Pneumonia 23 today and prescriptin will be handed to you for cialis  Need 2 dose shingles vaccine, check pharmacy, and plan for March start  You need colonoscopy in Sept I am refering you   Pls call any time after April and collect lab order to be drawn fasting Sept 29 or shortly after CBC, lipid, cmp and eGFr, PsA , TSH

## 2018-11-17 ENCOUNTER — Encounter (INDEPENDENT_AMBULATORY_CARE_PROVIDER_SITE_OTHER): Payer: Self-pay | Admitting: *Deleted

## 2018-11-22 ENCOUNTER — Encounter: Payer: Self-pay | Admitting: Family Medicine

## 2018-11-22 NOTE — Assessment & Plan Note (Signed)
Controlled, no change in medication DASH diet and commitment to daily physical activity for a minimum of 30 minutes discussed and encouraged, as a part of hypertension management. The importance of attaining a healthy weight is also discussed.  BP/Weight 11/16/2018 10/15/2018 08/03/2018 06/03/2018 12/04/2017 10/20/2017 60/12/9845  Systolic BP 308 569 437 005 259 102 890  Diastolic BP 70 80 82 76 78 80 84  Wt. (Lbs) 174.08 169.3 171 172.04 180 180 178.5  BMI 22.97 22.34 22.56 22.7 23.75 23.75 23.55

## 2018-11-22 NOTE — Assessment & Plan Note (Signed)
Hyperlipidemia:Low fat diet discussed and encouraged.   Lipid Panel  Lab Results  Component Value Date   CHOL 225 (H) 09/16/2017   HDL 61 09/16/2017   LDLCALC 144 (H) 09/16/2017   TRIG 98 09/16/2017   CHOLHDL 3.7 09/16/2017   Updated lab needed at/ before next visit.

## 2018-11-22 NOTE — Assessment & Plan Note (Signed)
Patient educated about the importance of limiting  Carbohydrate intake , the need to commit to daily physical activity for a minimum of 30 minutes , and to commit weight loss. The fact that changes in all these areas will reduce or eliminate all together the development of diabetes is stressed.   Diabetic Labs Latest Ref Rng & Units 07/31/2018 09/16/2017 01/10/2017 07/24/2016 12/19/2015  HbA1c 4.8 - 5.6 % - 5.5 - - -  Chol 100 - 199 mg/dL - 225(H) 176 - -  HDL >39 mg/dL - 61 56 - -  Calc LDL 0 - 99 mg/dL - 144(H) 107(H) - -  Triglycerides 0 - 149 mg/dL - 98 66 - -  Creatinine 0.76 - 1.27 mg/dL 1.28(H) 1.03 1.06 1.42(H) 1.10   BP/Weight 11/16/2018 10/15/2018 08/03/2018 06/03/2018 12/04/2017 10/20/2017 94/85/4627  Systolic BP 035 009 381 829 937 169 678  Diastolic BP 70 80 82 76 78 80 84  Wt. (Lbs) 174.08 169.3 171 172.04 180 180 178.5  BMI 22.97 22.34 22.56 22.7 23.75 23.75 23.55   No flowsheet data found.

## 2018-11-22 NOTE — Assessment & Plan Note (Signed)
No change in management , no current flare

## 2018-11-22 NOTE — Progress Notes (Signed)
Bryce Martinez     MRN: [id number]      DOB: 1951/05/09   HPI [name] is here for follow up and re-evaluation of chronic medical conditions, medication management and review of any available recent lab and radiology data.  Preventive health is updated, specifically  Cancer screening and Immunization.   Questions or concerns regarding consultations or procedures which the PT has had in the interim are  addressed. The PT denies any adverse reactions to current medications since the last visit.  There are no new concerns.  There are no specific complaints   ROS Denies recent fever or chills. Denies sinus pressure, nasal congestion, ear pain or sore throat. Denies chest congestion, productive cough or wheezing. Denies chest pains, palpitations and leg swelling Denies abdominal pain, nausea, vomiting,diarrhea or constipation.   Denies dysuria, frequency, hesitancy or incontinence. Denies joint pain, swelling and limitation in mobility. Denies headaches, seizures, numbness, or tingling. Denies depression, anxiety or insomnia. Denies skin break down or rash.   PE  BP 128/70 (BP Location: Left Arm, Patient Position: Sitting, Cuff Size: Large)   Pulse 64   Resp 12   Ht 6\' 1"  (1.854 m)   Wt 174 lb 1.3 oz (79 kg)   SpO2 100% Comment: room air  BMI 22.97 kg/m   Patient alert and oriented and in no cardiopulmonary distress.  HEENT: No facial asymmetry, EOMI,   oropharynx pink and moist.  Neck supple no JVD, no mass.  Chest: Clear to auscultation bilaterally.  CVS: S1, S2 no murmurs, no S3.Regular rate.  ABD: Soft non tender.   Ext: No edema  MS: Adequate ROM spine, shoulders, hips and knees.  Skin: Intact, no ulcerations or rash noted.  Psych: Good eye contact, normal affect. Memory intact not anxious or depressed appearing.  CNS: CN 2-12 intact, power,  normal throughout.no focal deficits noted.   Assessment & Plan  Essential hypertension Controlled, no  change in medication DASH diet and commitment to daily physical activity for a minimum of 30 minutes discussed and encouraged, as a part of hypertension management. The importance of attaining a healthy weight is also discussed.  BP/Weight 11/16/2018 10/15/2018 08/03/2018 06/03/2018 12/04/2017 10/20/2017 24/07/7352  Systolic BP 299 242 683 419 622 297 989  Diastolic BP 70 80 82 76 78 80 84  Wt. (Lbs) 174.08 169.3 171 172.04 180 180 178.5  BMI 22.97 22.34 22.56 22.7 23.75 23.75 23.55       IGT (impaired glucose tolerance) Patient educated about the importance of limiting  Carbohydrate intake , the need to commit to daily physical activity for a minimum of 30 minutes , and to commit weight loss. The fact that changes in all these areas will reduce or eliminate all together the development of diabetes is stressed.   Diabetic Labs Latest Ref Rng & Units 07/31/2018 09/16/2017 01/10/2017 07/24/2016 12/19/2015  HbA1c 4.8 - 5.6 % - 5.5 - - -  Chol 100 - 199 mg/dL - 225(H) 176 - -  HDL >39 mg/dL - 61 56 - -  Calc LDL 0 - 99 mg/dL - 144(H) 107(H) - -  Triglycerides 0 - 149 mg/dL - 98 66 - -  Creatinine 0.76 - 1.27 mg/dL 1.28(H) 1.03 1.06 1.42(H) 1.10   BP/Weight 11/16/2018 10/15/2018 08/03/2018 06/03/2018 12/04/2017 10/20/2017 21/19/4174  Systolic BP 081 448 185 631 497 026 378  Diastolic BP 70 80 82 76 78 80 84  Wt. (Lbs) 174.08 169.3 171 172.04 180 180 178.5  BMI 22.97  22.34 22.56 22.7 23.75 23.75 23.55   No flowsheet data found.    Dyslipidemia Hyperlipidemia:Low fat diet discussed and encouraged.   Lipid Panel  Lab Results  Component Value Date   CHOL 225 (H) 09/16/2017   HDL 61 09/16/2017   LDLCALC 144 (H) 09/16/2017   TRIG 98 09/16/2017   CHOLHDL 3.7 09/16/2017   Updated lab needed at/ before next visit.     Allergic rhinitis No change in management , no current flare  ED (erectile dysfunction) Controlled on medication

## 2018-11-22 NOTE — Assessment & Plan Note (Signed)
Controlled on medication

## 2018-12-09 ENCOUNTER — Encounter: Payer: Self-pay | Admitting: Family Medicine

## 2019-03-08 ENCOUNTER — Other Ambulatory Visit: Payer: Self-pay | Admitting: Family Medicine

## 2019-03-09 ENCOUNTER — Other Ambulatory Visit: Payer: Self-pay | Admitting: Family Medicine

## 2019-03-19 ENCOUNTER — Encounter: Payer: Self-pay | Admitting: *Deleted

## 2019-04-04 ENCOUNTER — Other Ambulatory Visit: Payer: Self-pay | Admitting: Family Medicine

## 2019-06-15 ENCOUNTER — Telehealth (INDEPENDENT_AMBULATORY_CARE_PROVIDER_SITE_OTHER): Payer: Self-pay | Admitting: *Deleted

## 2019-06-15 NOTE — Telephone Encounter (Signed)
Left several messages for patient to call to schedule TCS  Referring MD/PCP: simpson   Procedure: tcs  Reason/Indication:  Screening, fam hx colon ca  Has patient had this procedure before?  no  If so, when, by whom and where?    Is there a family history of colon cancer?  Yes, father  Who?  What age when diagnosed?    Is patient diabetic?   no      Does patient have prosthetic heart valve or mechanical valve?  no  Do you have a pacemaker/defibrillator?  no  Has patient ever had endocarditis/atrial fibrillation? no  Does patient use oxygen? no  Has patient had joint replacement within last 12 months?  no  Is patient constipated or do they take laxatives? no  Does patient have a history of alcohol/drug use?  no  Is patient on blood thinner such as Coumadin, Plavix and/or Aspirin? no  Medications: triam/hctz 75/50 mg daily, amlodipine 2.5 mg daily  Allergies: nkda  Medication Adjustment per Dr Lindi Adie, NP: asa 2 days  Procedure date & time:

## 2019-06-16 ENCOUNTER — Other Ambulatory Visit: Payer: Self-pay | Admitting: Family Medicine

## 2019-06-28 ENCOUNTER — Other Ambulatory Visit (INDEPENDENT_AMBULATORY_CARE_PROVIDER_SITE_OTHER): Payer: Self-pay | Admitting: *Deleted

## 2019-06-28 DIAGNOSIS — Z800 Family history of malignant neoplasm of digestive organs: Secondary | ICD-10-CM

## 2019-06-28 DIAGNOSIS — Z1211 Encounter for screening for malignant neoplasm of colon: Secondary | ICD-10-CM | POA: Insufficient documentation

## 2019-07-07 ENCOUNTER — Encounter (INDEPENDENT_AMBULATORY_CARE_PROVIDER_SITE_OTHER): Payer: Self-pay | Admitting: *Deleted

## 2019-07-07 ENCOUNTER — Telehealth (INDEPENDENT_AMBULATORY_CARE_PROVIDER_SITE_OTHER): Payer: Self-pay | Admitting: *Deleted

## 2019-07-07 DIAGNOSIS — Z1211 Encounter for screening for malignant neoplasm of colon: Secondary | ICD-10-CM

## 2019-07-07 MED ORDER — PEG 3350-KCL-NA BICARB-NACL 420 G PO SOLR: 4000.0000 mL | Freq: Once | ORAL | 0 refills | Status: AC

## 2019-07-07 NOTE — Telephone Encounter (Signed)
Patient needs trilyte 

## 2019-07-19 ENCOUNTER — Other Ambulatory Visit: Payer: Self-pay

## 2019-07-19 ENCOUNTER — Ambulatory Visit (INDEPENDENT_AMBULATORY_CARE_PROVIDER_SITE_OTHER): Payer: Medicare Other

## 2019-07-19 DIAGNOSIS — Z23 Encounter for immunization: Secondary | ICD-10-CM | POA: Diagnosis not present

## 2019-08-04 ENCOUNTER — Ambulatory Visit (INDEPENDENT_AMBULATORY_CARE_PROVIDER_SITE_OTHER): Payer: Self-pay

## 2019-08-04 ENCOUNTER — Other Ambulatory Visit: Payer: Self-pay

## 2019-08-04 ENCOUNTER — Telehealth (INDEPENDENT_AMBULATORY_CARE_PROVIDER_SITE_OTHER): Payer: Self-pay | Admitting: *Deleted

## 2019-08-04 NOTE — Telephone Encounter (Signed)
Referring MD/PCP: simpson   Procedure: tcs  Reason/Indication:  Screening, fam hx colon ca  Has patient had this procedure before?  no             If so, when, by whom and where?    Is there a family history of colon cancer?  Yes, father             Who?  What age when diagnosed?    Is patient diabetic?   no                                                  Does patient have prosthetic heart valve or mechanical valve?  no  Do you have a pacemaker/defibrillator?  no  Has patient ever had endocarditis/atrial fibrillation? no  Does patient use oxygen? no  Has patient had joint replacement within last 12 months?  no  Is patient constipated or do they take laxatives? no  Does patient have a history of alcohol/drug use?  no  Is patient on blood thinner such as Coumadin, Plavix and/or Aspirin? no  Medications: triam/hctz 75/50 mg daily, amlodipine 2.5 mg daily  Allergies: nkda  Medication Adjustment per Dr Lindi Adie, NP: asa 2 days  Procedure date & time: 09/01/19 at 930

## 2019-08-05 ENCOUNTER — Telehealth: Payer: Self-pay | Admitting: *Deleted

## 2019-08-05 ENCOUNTER — Ambulatory Visit: Payer: Medicare Other

## 2019-08-05 ENCOUNTER — Other Ambulatory Visit: Payer: Self-pay

## 2019-08-05 ENCOUNTER — Ambulatory Visit (INDEPENDENT_AMBULATORY_CARE_PROVIDER_SITE_OTHER): Payer: Medicare Other | Admitting: Family Medicine

## 2019-08-05 ENCOUNTER — Encounter: Payer: Self-pay | Admitting: Family Medicine

## 2019-08-05 VITALS — BP 140/70 | HR 76 | Temp 98.2°F | Resp 15 | Ht 73.0 in | Wt 168.1 lb

## 2019-08-05 DIAGNOSIS — Z0000 Encounter for general adult medical examination without abnormal findings: Secondary | ICD-10-CM

## 2019-08-05 NOTE — Telephone Encounter (Signed)
Pt has follow up appt in march would like his lab orders sent to [facility] he will get them done a week before that appt

## 2019-08-05 NOTE — Telephone Encounter (Signed)
Labs are due this year and are PSA, fasting lipid, cmp and EGFr and CBC , can get them first week in [employer]

## 2019-08-05 NOTE — Telephone Encounter (Signed)
None in system. WHat do you want to order?

## 2019-08-05 NOTE — Patient Instructions (Addendum)
[name] , Thank you for taking time to come for your Medicare Wellness Visit. I appreciate your ongoing commitment to your health goals. Please review the following plan we discussed and let me know if I can assist you in the future.   Please continue to practice social distancing to keep you, your family, and our community safe.  If you must go out, please wear a Mask and practice good handwashing.  Screening recommendations/referrals: Colonoscopy: Scheduled for end of Oct 2020 Recommended yearly ophthalmology/optometry visit for glaucoma screening and checkup Recommended yearly dental visit for hygiene and checkup  Vaccinations: Influenza vaccine: Completed for 2020 Pneumococcal vaccine: Completed Tdap vaccine: Due 2025 Shingles vaccine: Completed  Advanced directives: You have paperwork  Conditions/risks identified: Fall  Next appointment: 01/17/2020   Preventive Care 67 Years and Older, Male Preventive care refers to lifestyle choices and visits with your health care provider that can promote health and wellness. What does preventive care include?  A yearly physical exam. This is also called an annual well check.  Dental exams once or twice a year.  Routine eye exams. Ask your health care provider how often you should have your eyes checked.  Personal lifestyle choices, including:  Daily care of your teeth and gums.  Regular physical activity.  Eating a healthy diet.  Avoiding tobacco and drug use.  Limiting alcohol use.  Practicing safe sex.  Taking low doses of aspirin every day.  Taking vitamin and mineral supplements as recommended by your health care provider. What happens during an annual well check? The services and screenings done by your health care provider during your annual well check will depend on your age, overall health, lifestyle risk factors, and family history of disease. Counseling  Your health care provider may ask you questions about  your:  Alcohol use.  Tobacco use.  Drug use.  Emotional well-being.  Home and relationship well-being.  Sexual activity.  Eating habits.  History of falls.  Memory and ability to understand (cognition).  Work and work Statistician. Screening  You may have the following tests or measurements:  Height, weight, and BMI.  Blood pressure.  Lipid and cholesterol levels. These may be checked every 5 years, or more frequently if you are over 27 years old.  Skin check.  Lung cancer screening. You may have this screening every year starting at age 65 if you have a 30-pack-year history of smoking and currently smoke or have quit within the past 15 years.  Fecal occult blood test (FOBT) of the stool. You may have this test every year starting at age 33.  Flexible sigmoidoscopy or colonoscopy. You may have a sigmoidoscopy every 5 years or a colonoscopy every 10 years starting at age 59.  Prostate cancer screening. Recommendations will vary depending on your family history and other risks.  Hepatitis C blood test.  Hepatitis B blood test.  Sexually transmitted disease (STD) testing.  Diabetes screening. This is done by checking your blood sugar (glucose) after you have not eaten for a while (fasting). You may have this done every 1-3 years.  Abdominal aortic aneurysm (AAA) screening. You may need this if you are a current or former smoker.  Osteoporosis. You may be screened starting at age 50 if you are at high risk. Talk with your health care provider about your test results, treatment options, and if necessary, the need for more tests. Vaccines  Your health care provider may recommend certain vaccines, such as:  Influenza vaccine. This is  recommended every year.  Tetanus, diphtheria, and acellular pertussis (Tdap, Td) vaccine. You may need a Td booster every 10 years.  Zoster vaccine. You may need this after age 74.  Pneumococcal 13-valent conjugate (PCV13) vaccine.  One dose is recommended after age 75.  Pneumococcal polysaccharide (PPSV23) vaccine. One dose is recommended after age 29. Talk to your health care provider about which screenings and vaccines you need and how often you need them. This information is not intended to replace advice given to you by your health care provider. Make sure you discuss any questions you have with your health care provider. Document Released: 11/17/2015 Document Revised: 07/10/2016 Document Reviewed: 08/22/2015 Elsevier Interactive Patient Education  2017 [place] Prevention in the Home Falls can cause injuries. They can happen to people of all ages. There are many things you can do to make your home safe and to help prevent falls. What can I do on the outside of my home?  Regularly fix the edges of walkways and driveways and fix any cracks.  Remove anything that might make you trip as you walk through a door, such as a raised step or threshold.  Trim any bushes or trees on the path to your home.  Use bright outdoor lighting.  Clear any walking paths of anything that might make someone trip, such as rocks or tools.  Regularly check to see if handrails are loose or broken. Make sure that both sides of any steps have handrails.  Any raised decks and porches should have guardrails on the edges.  Have any leaves, snow, or ice cleared regularly.  Use sand or salt on walking paths during winter.  Clean up any spills in your garage right away. This includes oil or grease spills. What can I do in the bathroom?  Use night lights.  Install grab bars by the toilet and in the tub and shower. Do not use towel bars as grab bars.  Use non-skid mats or decals in the tub or shower.  If you need to sit down in the shower, use a plastic, non-slip stool.  Keep the floor dry. Clean up any water that spills on the floor as soon as it happens.  Remove soap buildup in the tub or shower regularly.  Attach bath  mats securely with double-sided non-slip rug tape.  Do not have throw rugs and other things on the floor that can make you trip. What can I do in the bedroom?  Use night lights.  Make sure that you have a light by your bed that is easy to reach.  Do not use any sheets or blankets that are too big for your bed. They should not hang down onto the floor.  Have a firm chair that has side arms. You can use this for support while you get dressed.  Do not have throw rugs and other things on the floor that can make you trip. What can I do in the kitchen?  Clean up any spills right away.  Avoid walking on wet floors.  Keep items that you use a lot in easy-to-reach places.  If you need to reach something above you, use a strong step stool that has a grab bar.  Keep electrical cords out of the way.  Do not use floor polish or wax that makes floors slippery. If you must use wax, use non-skid floor wax.  Do not have throw rugs and other things on the floor that can make you trip.  What can I do with my stairs?  Do not leave any items on the stairs.  Make sure that there are handrails on both sides of the stairs and use them. Fix handrails that are broken or loose. Make sure that handrails are as long as the stairways.  Check any carpeting to make sure that it is firmly attached to the stairs. Fix any carpet that is loose or worn.  Avoid having throw rugs at the top or bottom of the stairs. If you do have throw rugs, attach them to the floor with carpet tape.  Make sure that you have a light switch at the top of the stairs and the bottom of the stairs. If you do not have them, ask someone to add them for you. What else can I do to help prevent falls?  Wear shoes that:  Do not have high heels.  Have rubber bottoms.  Are comfortable and fit you well.  Are closed at the toe. Do not wear sandals.  If you use a stepladder:  Make sure that it is fully opened. Do not climb a closed  stepladder.  Make sure that both sides of the stepladder are locked into place.  Ask someone to hold it for you, if possible.  Clearly mark and make sure that you can see:  Any grab bars or handrails.  First and last steps.  Where the edge of each step is.  Use tools that help you move around (mobility aids) if they are needed. These include:  Canes.  Walkers.  Scooters.  Crutches.  Turn on the lights when you go into a dark area. Replace any light bulbs as soon as they burn out.  Set up your furniture so you have a clear path. Avoid moving your furniture around.  If any of your floors are uneven, fix them.  If there are any pets around you, be aware of where they are.  Review your medicines with your doctor. Some medicines can make you feel dizzy. This can increase your chance of falling. Ask your doctor what other things that you can do to help prevent falls. This information is not intended to replace advice given to you by your health care provider. Make sure you discuss any questions you have with your health care provider. Document Released: 08/17/2009 Document Revised: 03/28/2016 Document Reviewed: 11/25/2014 Elsevier Interactive Patient Education  2017 [name].

## 2019-08-05 NOTE — Progress Notes (Signed)
Subjective:   Bryce Martinez is a 68 y.o. male who presents for Medicare Annual/Subsequent preventive examination.  Review of Systems:    Cardiac Risk Factors include: advanced age (>71men, >54 women);dyslipidemia;hypertension;male gender     Objective:    Vitals: BP 140/70   Pulse 76   Temp 98.2 F (36.8 C) (Oral)   Resp 15   Ht 6\' 1"  (1.854 m)   Wt 168 lb 1.3 oz (76.2 kg)   SpO2 98%   BMI 22.18 kg/m   Body mass index is 22.18 kg/m.  Advanced Directives 08/03/2018 07/10/2015 04/19/2014  Does Patient Have a Medical Advance Directive? Yes No Patient does not have advance directive;Patient would not like information  Does patient want to make changes to medical advance directive? Yes (ED - Information included in AVS) - -  Pre-existing out of facility DNR order (yellow form or pink MOST form) - - No    Tobacco Social History   Tobacco Use  Smoking Status Former Smoker  . Packs/day: 0.25  . Years: 15.00  . Pack years: 3.75  . Types: Cigarettes  . Quit date: 04/19/1972  . Years since quitting: 47.3  Smokeless Tobacco Never Used     Counseling given: Yes   Clinical Intake:  Pre-visit preparation completed: Yes  Pain : 0-10 Pain Score: 8  Pain Type: Chronic pain Pain Location: Back Pain Orientation: Lower Pain Descriptors / Indicators: Burning, Sharp, Pins and needles Pain Onset: More than a month ago Pain Frequency: Intermittent Pain Relieving Factors: standing up or Tylenol Effect of Pain on Daily Activities: no  Pain Relieving Factors: standing up or Tylenol  BMI - recorded: 22.18 Nutritional Status: BMI of 19-24  Normal Nutritional Risks: None Diabetes: No  How often do you need to have someone help you when you read instructions, pamphlets, or other written materials from your doctor or pharmacy?: 1 - Never What is the last grade level you completed in school?: 12 + some college  Interpreter Needed?: No     Past Medical History:  Diagnosis  Date  . Allergy   . Arthritis   . ED (erectile dysfunction)   . Hepatitis C   . Hyperlipidemia   . Hypertension    Past Surgical History:  Procedure Laterality Date  . APPENDECTOMY    . CHOLECYSTECTOMY N/A 04/25/2014   Procedure: LAPAROSCOPIC CHOLECYSTECTOMY;  Surgeon: Jamesetta So, MD;  Location: [facility];  Service: General;  Laterality: N/A;  . HEMICOLECTOMY Right    lap hand assisted  . right shoulder surgery Right 03/2005   rotator cuff repair   Family History  Problem Relation Age of Onset  . Hypertension Mother   . Cancer Father        colon   . Hypertension Sister   . Hypertension Brother   . Diabetes Brother   . Hypertension Daughter   . Hypertension Son    Social History   Socioeconomic History  . Marital status: Married    Spouse name: Not on file  . Number of children: Not on file  . Years of education: Not on file  . Highest education level: Not on file  Occupational History  . Not on file  Social Needs  . Financial resource strain: Not hard at all  . Food insecurity    Worry: Never true    Inability: Never true  . Transportation needs    Medical: No    Non-medical: No  Tobacco Use  . Smoking status: Former  Smoker    Packs/day: 0.25    Years: 15.00    Pack years: 3.75    Types: Cigarettes    Quit date: 04/19/1972    Years since quitting: 47.3  . Smokeless tobacco: Never Used  Substance and Sexual Activity  . Alcohol use: No    Alcohol/week: 0.0 standard drinks  . Drug use: No  . Sexual activity: Yes    Birth control/protection: None  Lifestyle  . Physical activity    Days per week: 0 days    Minutes per session: 0 min  . Stress: Not at all  Relationships  . Social Herbalist on phone: Not on file    Gets together: Not on file    Attends religious service: Not on file    Active member of club or organization: Not on file    Attends meetings of clubs or organizations: Not on file    Relationship status: Not on file  Other  Topics Concern  . Not on file  Social History Narrative  . Not on file    Outpatient Encounter Medications as of 08/05/2019  Medication Sig  . amLODipine (NORVASC) 2.5 MG tablet TAKE 1 TABLET BY MOUTH EVERY DAY  . aspirin EC 81 MG tablet Take 1 tablet (81 mg total) by mouth daily.  . fluticasone (FLONASE) 50 MCG/ACT nasal spray SPRAY 2 SPRAYS INTO EACH NOSTRIL EVERY DAY  . tadalafil (ADCIRCA/CIALIS) 20 MG tablet One tablet every 3 days as needed for erectile funmction  . triamterene-hydrochlorothiazide (MAXZIDE) 75-50 MG tablet TAKE 1 TABLET BY MOUTH DAILY.  . [DISCONTINUED] tadalafil (ADCIRCA/CIALIS) 20 MG tablet Take one tablet every 3 days , as needed, for sexual activity (Patient not taking: Reported on 08/05/2019)   No facility-administered encounter medications on file as of 08/05/2019.     Activities of Daily Living In your present state of health, do you have any difficulty performing the following activities: 08/05/2019  Hearing? N  Vision? N  Difficulty concentrating or making decisions? N  Walking or climbing stairs? N  Dressing or bathing? N  Doing errands, shopping? N  Preparing Food and eating ? N  Using the Toilet? N  In the past six months, have you accidently leaked urine? N  Do you have problems with loss of bowel control? N  Managing your Medications? N  Managing your Finances? N  Housekeeping or managing your Housekeeping? N  Some recent data might be hidden    Patient Care Team: Fayrene Helper, MD as PCP - General   Assessment:   This is a routine wellness examination for [name].  Exercise Activities and Dietary recommendations Current Exercise Habits: The patient does not participate in regular exercise at present, Exercise limited by: None identified  Goals   None     Fall Risk Fall Risk  08/05/2019 11/16/2018 08/03/2018 08/03/2018 06/03/2018  Falls in the past year? 0 0 No No No  Number falls in past yr: 0 0 - - -  Injury with Fall? 0 0 - - -    Is the patient's home free of loose throw rugs in walkways, pet beds, electrical cords, etc?   yes      Grab bars in the bathroom? yes      Handrails on the stairs?   yes      Adequate lighting?   yes     Depression Screen PHQ 2/9 Scores 08/05/2019 11/16/2018 08/03/2018 08/03/2018  PHQ - 2 Score 0 0 0 0  PHQ- 9 Score - - - -    Cognitive Function     6CIT Screen 08/05/2019 08/03/2018  What Year? 0 points -  What month? 0 points -  What time? 0 points 0 points  Count back from 20 0 points 0 points  Months in reverse 0 points 0 points  Repeat phrase 0 points 0 points  Total Score 0 -    Immunization History  Administered Date(s) Administered  . Fluad Quad(high Dose 65+) 07/19/2019  . Influenza,inj,Quad PF,6+ Mos 08/16/2014, 08/24/2015, 07/24/2016, 08/03/2018  . Pneumococcal Conjugate-13 09/22/2017  . Pneumococcal Polysaccharide-23 11/16/2018  . Td 06/14/2004  . Tdap 10/10/2014  . Zoster 07/22/2012    Qualifies for Shingles Vaccine?  Completed   Screening Tests Health Maintenance  Topic Date Due  . COLONOSCOPY  07/28/2019  . TETANUS/TDAP  10/10/2024  . INFLUENZA VACCINE  Completed  . Hepatitis C Screening  Completed  . PNA vac Low Risk Adult  Completed   Cancer Screenings: Lung: Low Dose CT Chest recommended if Age 71-80 years, 30 pack-year currently smoking OR have quit w/in 15years. Patient does not qualify. Colorectal:  Due 2020-Scheduled for 08/2019  Additional Screenings:   Hepatitis C Screening: Completed      Plan:       1. Encounter for Medicare annual wellness exam  I have personally reviewed and noted the following in the patient's chart:   . Medical and social history . Use of alcohol, tobacco or illicit drugs  . Current medications and supplements . Functional ability and status . Nutritional status . Physical activity . Advanced directives . List of other physicians . Hospitalizations, surgeries, and ER visits in previous 12 months .  Vitals . Screenings to include cognitive, depression, and falls . Referrals and appointments  In addition, I have reviewed and discussed with patient certain preventive protocols, quality metrics, and best practice recommendations. A written personalized care plan for preventive services as well as general preventive health recommendations were provided to patient.     Perlie Mayo, NP  08/05/2019

## 2019-08-06 ENCOUNTER — Ambulatory Visit: Payer: Medicare Other

## 2019-08-06 ENCOUNTER — Telehealth: Payer: Self-pay

## 2019-08-06 DIAGNOSIS — N528 Other male erectile dysfunction: Secondary | ICD-10-CM

## 2019-08-06 DIAGNOSIS — E785 Hyperlipidemia, unspecified: Secondary | ICD-10-CM

## 2019-08-06 DIAGNOSIS — Z125 Encounter for screening for malignant neoplasm of prostate: Secondary | ICD-10-CM

## 2019-08-06 DIAGNOSIS — I10 Essential (primary) hypertension: Secondary | ICD-10-CM

## 2019-08-06 NOTE — Telephone Encounter (Signed)
Labs ordered and faxed to [facility] on [address]

## 2019-08-06 NOTE — Telephone Encounter (Signed)
Labs ordered to be drawn in November

## 2019-08-16 ENCOUNTER — Ambulatory Visit: Payer: Medicare Other

## 2019-08-17 ENCOUNTER — Encounter (INDEPENDENT_AMBULATORY_CARE_PROVIDER_SITE_OTHER): Payer: Self-pay | Admitting: *Deleted

## 2019-08-30 ENCOUNTER — Other Ambulatory Visit (HOSPITAL_COMMUNITY)
Admission: RE | Admit: 2019-08-30 | Discharge: 2019-08-30 | Disposition: A | Discharge status: Home / Self Care | Payer: Medicare Other | Source: Ambulatory Visit | Attending: Internal Medicine | Admitting: Internal Medicine

## 2019-09-01 ENCOUNTER — Encounter (HOSPITAL_COMMUNITY): Payer: Self-pay | Admitting: *Deleted

## 2019-09-01 ENCOUNTER — Encounter (HOSPITAL_COMMUNITY)
Admission: RE | Disposition: A | Discharge status: Home / Self Care | Payer: Self-pay | Source: Home / Self Care | Attending: Internal Medicine

## 2019-09-01 ENCOUNTER — Other Ambulatory Visit: Payer: Self-pay

## 2019-09-01 ENCOUNTER — Ambulatory Visit (HOSPITAL_COMMUNITY)
Admission: RE | Admit: 2019-09-01 | Discharge: 2019-09-01 | Disposition: A | Discharge status: Home / Self Care | Payer: Medicare Other | Attending: Internal Medicine | Admitting: Internal Medicine

## 2019-09-01 DIAGNOSIS — K644 Residual hemorrhoidal skin tags: Secondary | ICD-10-CM | POA: Insufficient documentation

## 2019-09-01 DIAGNOSIS — Z7982 Long term (current) use of aspirin: Secondary | ICD-10-CM | POA: Insufficient documentation

## 2019-09-01 DIAGNOSIS — Z1211 Encounter for screening for malignant neoplasm of colon: Secondary | ICD-10-CM | POA: Insufficient documentation

## 2019-09-01 DIAGNOSIS — Z800 Family history of malignant neoplasm of digestive organs: Secondary | ICD-10-CM | POA: Insufficient documentation

## 2019-09-01 DIAGNOSIS — Z79899 Other long term (current) drug therapy: Secondary | ICD-10-CM | POA: Diagnosis not present

## 2019-09-01 DIAGNOSIS — Z20828 Contact with and (suspected) exposure to other viral communicable diseases: Secondary | ICD-10-CM | POA: Diagnosis not present

## 2019-09-01 DIAGNOSIS — Z9049 Acquired absence of other specified parts of digestive tract: Secondary | ICD-10-CM | POA: Insufficient documentation

## 2019-09-01 DIAGNOSIS — I10 Essential (primary) hypertension: Secondary | ICD-10-CM | POA: Insufficient documentation

## 2019-09-01 DIAGNOSIS — Z7951 Long term (current) use of inhaled steroids: Secondary | ICD-10-CM | POA: Insufficient documentation

## 2019-09-01 DIAGNOSIS — Z87891 Personal history of nicotine dependence: Secondary | ICD-10-CM | POA: Diagnosis not present

## 2019-09-01 DIAGNOSIS — N529 Male erectile dysfunction, unspecified: Secondary | ICD-10-CM | POA: Diagnosis not present

## 2019-09-01 DIAGNOSIS — D123 Benign neoplasm of transverse colon: Secondary | ICD-10-CM | POA: Diagnosis not present

## 2019-09-01 DIAGNOSIS — Z980 Intestinal bypass and anastomosis status: Secondary | ICD-10-CM | POA: Insufficient documentation

## 2019-09-01 HISTORY — PX: POLYPECTOMY: SHX5525

## 2019-09-01 HISTORY — PX: COLONOSCOPY: SHX5424

## 2019-09-01 LAB — SARS CORONAVIRUS 2 BY RT PCR (HOSPITAL ORDER, PERFORMED IN ~~LOC~~ HOSPITAL LAB): SARS Coronavirus 2: NEGATIVE

## 2019-09-01 SURGERY — COLONOSCOPY
Anesthesia: Moderate Sedation

## 2019-09-01 MED ORDER — SODIUM CHLORIDE 0.9 % IV SOLN
INTRAVENOUS | Status: DC
  Administered 2019-09-01: 1000 mL via INTRAVENOUS

## 2019-09-01 MED ORDER — STERILE WATER FOR IRRIGATION IR SOLN
Status: DC | PRN
  Administered 2019-09-01: 2.5 mL

## 2019-09-01 MED ORDER — MEPERIDINE HCL 50 MG/ML IJ SOLN
INTRAMUSCULAR | Status: DC | PRN
  Administered 2019-09-01 (×2): 25 mg via INTRAVENOUS

## 2019-09-01 MED ORDER — MIDAZOLAM HCL 5 MG/5ML IJ SOLN
INTRAMUSCULAR | Status: DC | PRN
  Administered 2019-09-01: 1 mg via INTRAVENOUS
  Administered 2019-09-01 (×2): 2 mg via INTRAVENOUS

## 2019-09-01 MED ORDER — MEPERIDINE HCL 50 MG/ML IJ SOLN
INTRAMUSCULAR | Status: AC
  Filled 2019-09-01: qty 1

## 2019-09-01 MED ORDER — MIDAZOLAM HCL 5 MG/5ML IJ SOLN
INTRAMUSCULAR | Status: AC
  Filled 2019-09-01: qty 10

## 2019-09-01 NOTE — H&P (Signed)
Bryce Martinez is an 68 y.o. male.   Chief Complaint: Patient is here for colonoscopy. HPI: Patient is 68 year old Afro-American male who is here for screening colonoscopy.  Last exam was normal 10 years ago.  He denies abdominal pain change in bowel habits or rectal bleeding.  He does not take aspirin anymore. Family history is significant for CRC in father at late onset.  He was 89 years old at the time of diagnosis.  Past Medical History:  Diagnosis Date  . Allergy   . Arthritis   . ED (erectile dysfunction)   . Hepatitis C   . Hyperlipidemia   . Hypertension     Past Surgical History:  Procedure Laterality Date  . APPENDECTOMY    . CHOLECYSTECTOMY N/A 04/25/2014   Procedure: LAPAROSCOPIC CHOLECYSTECTOMY;  Surgeon: Jamesetta So, MD;  Location: [facility];  Service: General;  Laterality: N/A;  . HEMICOLECTOMY Right    lap hand assisted  . right shoulder surgery Right 03/2005   rotator cuff repair    Family History  Problem Relation Age of Onset  . Hypertension Mother   . Cancer Father        colon   . Hypertension Sister   . Hypertension Brother   . Diabetes Brother   . Hypertension Daughter   . Hypertension Son    Social History:  reports that he quit smoking about 47 years ago. His smoking use included cigarettes. He has a 3.75 pack-year smoking history. He has never used smokeless tobacco. He reports current alcohol use. He reports that he does not use drugs.  Allergies: No Known Allergies  Medications Prior to Admission  Medication Sig Dispense Refill  . amLODipine (NORVASC) 2.5 MG tablet TAKE 1 TABLET BY MOUTH EVERY DAY (Patient taking differently: Take 2.5 mg by mouth daily. ) 90 tablet 1  . fluticasone (FLONASE) 50 MCG/ACT nasal spray SPRAY 2 SPRAYS INTO EACH NOSTRIL EVERY DAY (Patient taking differently: Place 2 sprays into both nostrils daily as needed for allergies or rhinitis. ) 16 g 5  . tadalafil (ADCIRCA/CIALIS) 20 MG tablet One tablet every 3 days as  needed for erectile funmction (Patient taking differently: Take 20 mg by mouth daily as needed for erectile dysfunction. ) 15 tablet 1  . triamterene-hydrochlorothiazide (MAXZIDE) 75-50 MG tablet TAKE 1 TABLET BY MOUTH DAILY. 90 tablet 1  . aspirin EC 81 MG tablet Take 1 tablet (81 mg total) by mouth daily. (Patient not taking: Reported on 08/25/2019) 100 tablet 3    Results for orders placed or performed during the hospital encounter of 09/01/19 (from the past 48 hour(s))  SARS Coronavirus 2 by RT PCR (hospital order, performed in [facility] hospital lab) Nasopharyngeal Nasopharyngeal Swab     Status: None   Collection Time: 09/01/19  7:06 AM   Specimen: Nasopharyngeal Swab  Result Value Ref Range   SARS Coronavirus 2 NEGATIVE NEGATIVE    Comment: (NOTE) If result is NEGATIVE SARS-CoV-2 target nucleic acids are NOT DETECTED. The SARS-CoV-2 RNA is generally detectable in upper and lower  respiratory specimens during the acute phase of infection. The lowest  concentration of SARS-CoV-2 viral copies this assay can detect is 250  copies / mL. A negative result does not preclude SARS-CoV-2 infection  and should not be used as the sole basis for treatment or other  patient management decisions.  A negative result may occur with  improper specimen collection / handling, submission of specimen other  than nasopharyngeal swab, presence of  viral mutation(s) within the  areas targeted by this assay, and inadequate number of viral copies  (<250 copies / mL). A negative result must be combined with clinical  observations, patient history, and epidemiological information. If result is POSITIVE SARS-CoV-2 target nucleic acids are DETECTED. The SARS-CoV-2 RNA is generally detectable in upper and lower  respiratory specimens dur ing the acute phase of infection.  Positive  results are indicative of active infection with SARS-CoV-2.  Clinical  correlation with patient history and other diagnostic  information is  necessary to determine patient infection status.  Positive results do  not rule out bacterial infection or co-infection with other viruses. If result is PRESUMPTIVE POSTIVE SARS-CoV-2 nucleic acids MAY BE PRESENT.   A presumptive positive result was obtained on the submitted specimen  and confirmed on repeat testing.  While 2019 novel coronavirus  (SARS-CoV-2) nucleic acids may be present in the submitted sample  additional confirmatory testing may be necessary for epidemiological  and / or clinical management purposes  to differentiate between  SARS-CoV-2 and other Sarbecovirus currently known to infect humans.  If clinically indicated additional testing with an alternate test  methodology [phone]) is advised. The SARS-CoV-2 RNA is generally  detectable in upper and lower respiratory sp ecimens during the acute  phase of infection. The expected result is Negative. Fact Sheet for Patients:  StrictlyIdeas.no Fact Sheet for Healthcare Providers: BankingDealers.co.za This test is not yet approved or cleared by the [place] FDA and has been authorized for detection and/or diagnosis of SARS-CoV-2 by FDA under an Emergency Use Authorization (EUA).  This EUA will remain in effect (meaning this test can be used) for the duration of the COVID-19 declaration under Section 564(b)(1) of the Act, 21 U.S.C. section 360bbb-3(b)(1), unless the authorization is terminated or revoked sooner. Performed at [facility], [place] [address] [place], [place] [address]    No results found.  ROS  Blood pressure (!) 152/79, pulse 63, temperature 98.6 F (37 C), temperature source Oral, resp. rate 18, height 6\' 1"  (1.854 m), weight 76.7 kg, SpO2 100 %. Physical Exam  Constitutional: He appears well-developed and well-nourished.  HENT:  Mouth/Throat: Oropharynx is clear and moist.  Eyes: Conjunctivae are normal. No scleral icterus.   Neck: No thyromegaly present.  Cardiovascular: Normal rate, regular Bryce and normal heart sounds.  No murmur heard. Respiratory: Effort normal and breath sounds normal.  GI:  Abdomen is flat with small midline scar extending from iliacus to the right side and a smaller scar in hypogastric area.  Abdomen is soft without tenderness organomegaly or masses.  Musculoskeletal:        General: No edema.  Lymphadenopathy:    He has no cervical adenopathy.  Neurological: He is alert.  Skin: Skin is warm and dry.     Assessment/Plan Average risk screening colonoscopy. Family history of CRC in father but at late onset.  Hildred Laser, MD 09/01/2019, 9:33 AM

## 2019-09-01 NOTE — Op Note (Signed)
[facility] Patient Name: Bryce Martinez Procedure Date: 09/01/2019 9:23 AM MRN: [id number] Date of Birth: 1950/12/07 Attending MD: Hildred Laser , MD CSN: [id number] Age: 68 Admit Type: Outpatient Procedure:                Colonoscopy Indications:              Screening for colorectal malignant neoplasm Providers:                Hildred Laser, MD, Otis Peak B. Sharon Seller, RN, Raphael Gibney, Technician Referring MD:             Norwood Levo. Simpson MD, MD Medicines:                Meperidine 50 mg IV, Midazolam 5 mg IV Complications:            No immediate complications. Estimated Blood Loss:     Estimated blood loss was minimal. Procedure:                Pre-Anesthesia Assessment:                           - Prior to the procedure, a History and Physical                            was performed, and patient medications and                            allergies were reviewed. The patient's tolerance of                            previous anesthesia was also reviewed. The risks                            and benefits of the procedure and the sedation                            options and risks were discussed with the patient.                            All questions were answered, and informed consent                            was obtained. Prior Anticoagulants: The patient has                            taken no previous anticoagulant or antiplatelet                            agents. ASA Grade Assessment: II - A patient with                            mild systemic disease. After reviewing the risks  and benefits, the patient was deemed in                            satisfactory condition to undergo the procedure.                           After obtaining informed consent, the colonoscope                            was passed under direct vision. Throughout the                            procedure, the patient's blood pressure,  pulse, and                            oxygen saturations were monitored continuously. The                            [id number]-[id number] scope was introduced through                            the anus and advanced to the the ileocolonic                            anastomosis. The colonoscopy was performed without                            difficulty. The patient tolerated the procedure                            well. The quality of the bowel preparation was                            good. The terminal ileum and the rectum were                            photographed. Scope In: 9:41:59 AM Scope Out: 10:09:09 AM Scope Withdrawal Time: 0 hours 16 minutes 25 seconds  Total Procedure Duration: 0 hours 27 minutes 10 seconds  Findings:      Skin tags were found on perianal exam.      The neo-terminal ileum appeared normal.      There was evidence of a prior side-to-side ileo-colonic anastomosis at       the hepatic flexure. This was patent. The anastomosis was traversed.      A 5 mm polyp was found in the transverse colon. The polyp was sessile.       The polyp was removed with a cold snare. Resection and retrieval were       complete. The pathology specimen was placed into Bottle Number 1.      A small polyp was found in the splenic flexure. The polyp was sessile.       The polyp was removed with a cold snare. Resection was complete, but the       polyp tissue was not retrieved.      The retroflexed view of the distal  rectum and anal verge was normal and       showed no anal or rectal abnormalities. Impression:               - Perianal skin tags found on perianal exam.                           - The examined portion of the ileum was normal.                           - Patent side-to-side ileo-colonic anastomosis.                           - One 5 mm polyp in the transverse colon, removed                            with a cold snare. Resected and retrieved.                           -  One small polyp at the splenic flexure, removed                            with a cold snare. Complete resection. Polyp tissue                            not retrieved. Moderate Sedation:      Moderate (conscious) sedation was administered by the endoscopy nurse       and supervised by the endoscopist. The following parameters were       monitored: oxygen saturation, heart rate, blood pressure, CO2       capnography and response to care. Total physician intraservice time was       31 minutes. Recommendation:           - Patient has a contact number available for                            emergencies. The signs and symptoms of potential                            delayed complications were discussed with the                            patient. Return to normal activities tomorrow.                            Written discharge instructions were provided to the                            patient.                           - Resume previous diet today.                           - Continue present medications.                           -  No aspirin, ibuprofen, naproxen, or other                            non-steroidal anti-inflammatory drugs for 1 day.                           - Await pathology results.                           - Repeat colonoscopy is recommended. The                            colonoscopy date will be determined after pathology                            results from today's exam become available for                            review. Procedure Code(s):        --- Professional ---                           [phone], Colonoscopy, flexible; with removal of                            tumor(s), polyp(s), or other lesion(s) by snare                            technique                           99153, Moderate sedation; each additional 15                            minutes intraservice time                           G0500, Moderate sedation services provided by the                             same physician or other qualified health care                            professional performing a gastrointestinal                            endoscopic service that sedation supports,                            requiring the presence of an independent trained                            observer to assist in the monitoring of the                            patient's level of consciousness and physiological  status; initial 15 minutes of intra-service time;                            patient age 11 years or older (additional time may                            be reported with [phone], as appropriate) Diagnosis Code(s):        --- Professional ---                           Z12.11, Encounter for screening for malignant                            neoplasm of colon                           Z98.0, Intestinal bypass and anastomosis status                           K63.5, Polyp of colon                           K64.4, Residual hemorrhoidal skin tags CPT copyright 2019 [employer]. All rights reserved. The codes documented in this report are preliminary and upon coder review may  be revised to meet current compliance requirements. Hildred Laser, MD Hildred Laser, MD 09/01/2019 10:18:42 AM This report has been signed electronically. Number of Addenda: 0

## 2019-09-01 NOTE — Discharge Instructions (Signed)
No aspirin or NSAIDs for 24 hours. °Resume usual medications as before. °Resume usual diet. °No driving for 24 hours. °Physician will call with biopsy results. ° ° °Colonoscopy, Adult, Care After °This sheet gives you information about how to care for yourself after your procedure. Your doctor may also give you more specific instructions. If you have problems or questions, call your doctor. °What can I expect after the procedure? °After the procedure, it is common to have: °· A small amount of blood in your poop for 24 hours. °· Some gas. °· Mild cramping or bloating in your belly. °Follow these instructions at home: °General instructions °· For the first 24 hours after the procedure: °? Do not drive or use machinery. °? Do not sign important documents. °? Do not drink alcohol. °? Do your daily activities more slowly than normal. °? Eat foods that are soft and easy to digest. °· Take over-the-counter or prescription medicines only as told by your doctor. °To help cramping and bloating: ° °· Try walking around. °· Put heat on your belly (abdomen) as told by your doctor. Use a heat source that your doctor recommends, such as a moist heat pack or a heating pad. °? Put a towel between your skin and the heat source. °? Leave the heat on for 20-30 minutes. °? Remove the heat if your skin turns bright red. This is especially important if you cannot feel pain, heat, or cold. You can get burned. °Eating and drinking ° °· Drink enough fluid to keep your pee (urine) clear or pale yellow. °· Return to your normal diet as told by your doctor. Avoid heavy or fried foods that are hard to digest. °· Avoid drinking alcohol for as long as told by your doctor. °Contact a doctor if: °· You have blood in your poop (stool) 2-3 days after the procedure. °Get help right away if: °· You have more than a small amount of blood in your poop. °· You see large clumps of tissue (blood clots) in your poop. °· Your belly is swollen. °· You feel  sick to your stomach (nauseous). °· You throw up (vomit). °· You have a fever. °· You have belly pain that gets worse, and medicine does not help your pain. °Summary °· After the procedure, it is common to have a small amount of blood in your poop. You may also have mild cramping and bloating in your belly. °· For the first 24 hours after the procedure, do not drive or use machinery, do not sign important documents, and do not drink alcohol. °· Get help right away if you have a lot of blood in your poop, feel sick to your stomach, have a fever, or have more belly pain. °This information is not intended to replace advice given to you by your health care provider. Make sure you discuss any questions you have with your health care provider. °Document Released: 11/23/2010 Document Revised: 08/21/2017 Document Reviewed: 07/15/2016 °Elsevier Patient Education © 2020 [employer] Inc. ° °Colon Polyps ° °Polyps are tissue growths inside the body. Polyps can grow in many places, including the large intestine (colon). A polyp may be a round bump or a mushroom-shaped growth. You could have one polyp or several. °Most colon polyps are noncancerous (benign). However, some colon polyps can become cancerous over time. Finding and removing the polyps early can help prevent this. °What are the causes? °The exact cause of colon polyps is not known. °What increases the risk? °You are more   likely to develop this condition if you: °· Have a family history of colon cancer or colon polyps. °· Are older than 50 or older than 45 if you are African American. °· Have inflammatory bowel disease, such as ulcerative colitis or Crohn's disease. °· Have certain hereditary conditions, such as: °? Familial adenomatous polyposis. °? Lynch syndrome. °? Turcot syndrome. °? Peutz-Jeghers syndrome. °· Are overweight. °· Smoke cigarettes. °· Do not get enough exercise. °· Drink too much alcohol. °· Eat a diet that is high in fat and red meat and low in  fiber. °· Had childhood cancer that was treated with abdominal radiation. °What are the signs or symptoms? °Most polyps do not cause symptoms. °If you have symptoms, they may include: °· Blood coming from your rectum when having a bowel movement. °· Blood in your stool. The stool may look dark red or black. °· Abdominal pain. °· A change in bowel habits, such as constipation or diarrhea. °How is this diagnosed? °This condition is diagnosed with a colonoscopy. This is a procedure in which a lighted, flexible scope is inserted into the anus and then passed into the colon to examine the area. Polyps are sometimes found when a colonoscopy is done as part of routine cancer screening tests. °How is this treated? °Treatment for this condition involves removing any polyps that are found. Most polyps can be removed during a colonoscopy. Those polyps will then be tested for cancer. Additional treatment may be needed depending on the results of testing. °Follow these instructions at home: °Lifestyle °· Maintain a healthy weight, or lose weight if recommended by your health care provider. °· Exercise every day or as told by your health care provider. °· Do not use any products that contain nicotine or tobacco, such as cigarettes and e-cigarettes. If you need help quitting, ask your health care provider. °· If you drink alcohol, limit how much you have: °? 0-1 drink a day for women. °? 0-2 drinks a day for men. °· Be aware of how much alcohol is in your drink. In the [place] one drink equals one 12 oz bottle of beer (355 mL), one 5 oz glass of wine (148 mL), or one 1½ oz shot of hard liquor (44 mL). °Eating and drinking ° °· Eat foods that are high in fiber, such as fruits, vegetables, and whole grains. °· Eat foods that are high in calcium and vitamin D, such as milk, cheese, yogurt, eggs, liver, fish, and broccoli. °· Limit foods that are high in fat, such as fried foods and desserts. °· Limit the amount of red meat and  processed meat you eat, such as hot dogs, sausage, bacon, and lunch meats. °General instructions °· Keep all follow-up visits as told by your health care provider. This is important. °? This includes having regularly scheduled colonoscopies. °? Talk to your health care provider about when you need a colonoscopy. °Contact a health care provider if: °· You have new or worsening bleeding during a bowel movement. °· You have new or increased blood in your stool. °· You have a change in bowel habits. °· You lose weight for no known reason. °Summary °· Polyps are tissue growths inside the body. Polyps can grow in many places, including the colon. °· Most colon polyps are noncancerous (benign), but some can become cancerous over time. °· This condition is diagnosed with a colonoscopy. °· Treatment for this condition involves removing any polyps that are found. Most polyps can be removed during a   colonoscopy. °This information is not intended to replace advice given to you by your health care provider. Make sure you discuss any questions you have with your health care provider. °Document Released: 07/17/2004 Document Revised: 02/05/2018 Document Reviewed: 02/05/2018 °Elsevier Patient Education © 2020 Elsevier Inc. ° °

## 2019-09-02 LAB — SURGICAL PATHOLOGY

## 2019-09-03 ENCOUNTER — Encounter (HOSPITAL_COMMUNITY): Payer: Self-pay | Admitting: Internal Medicine

## 2019-09-06 ENCOUNTER — Telehealth: Payer: Self-pay

## 2019-09-06 NOTE — Telephone Encounter (Signed)
-----   Message from Fayrene Helper, MD sent at 09/03/2019 11:42 AM EDT ----- Pls remind pt that labs ordered early in October, have still not yet been done, he had called requesting labs, and they are overdue

## 2019-09-06 NOTE — Telephone Encounter (Signed)
Spoke with patient and let him know that labs have been ordered and he needs to have them drawn. He verbalized understanding.

## 2019-09-10 DIAGNOSIS — E785 Hyperlipidemia, unspecified: Secondary | ICD-10-CM | POA: Diagnosis not present

## 2019-09-10 DIAGNOSIS — Z125 Encounter for screening for malignant neoplasm of prostate: Secondary | ICD-10-CM | POA: Diagnosis not present

## 2019-09-10 DIAGNOSIS — N528 Other male erectile dysfunction: Secondary | ICD-10-CM | POA: Diagnosis not present

## 2019-09-10 DIAGNOSIS — I10 Essential (primary) hypertension: Secondary | ICD-10-CM | POA: Diagnosis not present

## 2019-09-11 LAB — PSA: Prostate Specific Ag, Serum: 0.9 ng/mL (ref 0.0–4.0)

## 2019-09-11 LAB — CMP14+EGFR
ALT: 16 IU/L (ref 0–44)
AST: 24 IU/L (ref 0–40)
Albumin/Globulin Ratio: 1.6 (ref 1.2–2.2)
Albumin: 4.3 g/dL (ref 3.8–4.8)
Alkaline Phosphatase: 56 IU/L (ref 39–117)
BUN/Creatinine Ratio: 11 (ref 10–24)
BUN: 14 mg/dL (ref 8–27)
Bilirubin Total: 1.4 mg/dL — ABNORMAL HIGH (ref 0.0–1.2)
CO2: 26 mmol/L (ref 20–29)
Calcium: 9.4 mg/dL (ref 8.6–10.2)
Chloride: 99 mmol/L (ref 96–106)
Creatinine, Ser: 1.24 mg/dL (ref 0.76–1.27)
GFR calc Af Amer: 69 mL/min/{1.73_m2} (ref >59)
GFR calc non Af Amer: 60 mL/min/{1.73_m2} (ref >59)
Globulin, Total: 2.7 g/dL (ref 1.5–4.5)
Glucose: 99 mg/dL (ref 65–99)
Potassium: 3.6 mmol/L (ref 3.5–5.2)
Sodium: 140 mmol/L (ref 134–144)
Total Protein: 7 g/dL (ref 6.0–8.5)

## 2019-09-11 LAB — CBC
Hematocrit: 42.3 % (ref 37.5–51.0)
Hemoglobin: 13.9 g/dL (ref 13.0–17.7)
MCH: 27.3 pg (ref 26.6–33.0)
MCHC: 32.9 g/dL (ref 31.5–35.7)
MCV: 83 fL (ref 79–97)
Platelets: 221 10*3/uL (ref 150–450)
RBC: 5.1 x10E6/uL (ref 4.14–5.80)
RDW: 13.5 % (ref 11.6–15.4)
WBC: 4 10*3/uL (ref 3.4–10.8)

## 2019-09-11 LAB — LIPID PANEL
Chol/HDL Ratio: 3.4 ratio (ref 0.0–5.0)
Cholesterol, Total: 221 mg/dL — ABNORMAL HIGH (ref 100–199)
HDL: 65 mg/dL (ref >39)
LDL Chol Calc (NIH): 141 mg/dL — ABNORMAL HIGH (ref 0–99)
Triglycerides: 88 mg/dL (ref 0–149)
VLDL Cholesterol Cal: 15 mg/dL (ref 5–40)

## 2019-09-12 ENCOUNTER — Other Ambulatory Visit: Payer: Self-pay | Admitting: Family Medicine

## 2019-09-13 ENCOUNTER — Encounter: Payer: Self-pay | Admitting: Family Medicine

## 2019-10-18 ENCOUNTER — Ambulatory Visit (INDEPENDENT_AMBULATORY_CARE_PROVIDER_SITE_OTHER): Payer: Medicare Other | Admitting: Nurse Practitioner

## 2019-10-20 ENCOUNTER — Ambulatory Visit (INDEPENDENT_AMBULATORY_CARE_PROVIDER_SITE_OTHER): Payer: Medicare Other | Admitting: Nurse Practitioner

## 2019-10-21 ENCOUNTER — Ambulatory Visit (INDEPENDENT_AMBULATORY_CARE_PROVIDER_SITE_OTHER): Payer: Medicare Other | Admitting: Nurse Practitioner

## 2019-10-21 ENCOUNTER — Encounter (INDEPENDENT_AMBULATORY_CARE_PROVIDER_SITE_OTHER): Payer: Self-pay | Admitting: Nurse Practitioner

## 2019-10-21 ENCOUNTER — Other Ambulatory Visit: Payer: Self-pay

## 2019-10-21 ENCOUNTER — Encounter (INDEPENDENT_AMBULATORY_CARE_PROVIDER_SITE_OTHER): Payer: Self-pay | Admitting: *Deleted

## 2019-10-21 VITALS — BP 106/72 | HR 72 | Temp 97.0°F | Ht 73.0 in | Wt 173.5 lb

## 2019-10-21 DIAGNOSIS — Z800 Family history of malignant neoplasm of digestive organs: Secondary | ICD-10-CM | POA: Diagnosis not present

## 2019-10-21 DIAGNOSIS — Z8619 Personal history of other infectious and parasitic diseases: Secondary | ICD-10-CM

## 2019-10-21 NOTE — Progress Notes (Signed)
Patient profile: Bryce Martinez is a 68 y.o. male seen for yearly f/up for hx of HCV. Also recently seen for colonoscopy 08/2019.   History of Present Illness: Bryce Martinez is seen today for yearly follow-up.  Has history of hepatitis C, treated in 2016 with sofosobuvir and ribavirin for genotype 2.  AFP normal 2019.  SVR confirmed 2018 with negative HCV viral load.  Seen today for follow-up and reports he is doing very well.  He very rarely has heartburn if he eats something spicy greasy-this is infrequent.  He denies any dysphagia, nausea, vomiting.  No abdominal pain.  Usually moves bowels daily.  Very rare diarrhea since had gallbladder removed 2015 after spicy food, this is not bothersome.  Overall he has no GI complaints. 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    Wt Readings from Last 3 Encounters:  10/21/19 173 lb 8 oz (78.7 kg)  09/01/19 169 lb (76.7 kg)  08/05/19 168 lb 1.3 oz (76.2 kg)     Last Colonoscopy: October 2020-perianal skin tags, side to side ileal colonic anastomosis, 5 mm polyp transverse, small polyp splenic flexure.  Path with tubular adenoma.  5-year repeat recommended   No noted EGD on file   Past Medical History:  Past Medical History:  Diagnosis Date  . Allergy   . Arthritis   . ED (erectile dysfunction)   . Hepatitis C   . Hyperlipidemia   . Hypertension     Problem List: Patient Active Problem List   Diagnosis Date Noted  . Special screening for malignant neoplasms, colon 06/28/2019  . Family history of colon cancer 06/28/2019  . Hearing loss 06/03/2018  . History of hepatitis C 07/26/2016  . Right-sided low back pain with right-sided sciatica 07/26/2016  . Hepatitis C antibody test positive 10/10/2014  . Allergic rhinitis 07/11/2011  . ED (erectile dysfunction) 07/11/2011  . Dyslipidemia 01/13/2008  . Essential hypertension 01/13/2008    Past Surgical History: Past Surgical History:  Procedure Laterality Date  .  APPENDECTOMY    . CHOLECYSTECTOMY N/A 04/25/2014   Procedure: LAPAROSCOPIC CHOLECYSTECTOMY;  Surgeon: Jamesetta So, MD;  Location: [facility];  Service: General;  Laterality: N/A;  . COLONOSCOPY N/A 09/01/2019   Procedure: COLONOSCOPY;  Surgeon: Rogene Houston, MD;  Location: [facility];  Service: Endoscopy;  Laterality: N/A;  930  . HEMICOLECTOMY Right    lap hand assisted  . POLYPECTOMY  09/01/2019   Procedure: POLYPECTOMY;  Surgeon: Rogene Houston, MD;  Location: [facility];  Service: Endoscopy;;  colon  . right shoulder surgery Right 03/2005   rotator cuff repair    Allergies: No Known Allergies    Home Medications:  Current Outpatient Medications:  .  amLODipine (NORVASC) 2.5 MG tablet, TAKE 1 TABLET BY MOUTH EVERY DAY (Patient taking differently: Take 2.5 mg by mouth daily. ), Disp: 90 tablet, Rfl: 1 .  fluticasone (FLONASE) 50 MCG/ACT nasal spray, SPRAY 2 SPRAYS INTO EACH NOSTRIL EVERY DAY (Patient taking differently: Place 2 sprays into both nostrils daily as needed for allergies or rhinitis. ), Disp: 16 g, Rfl: 5 .  tadalafil (ADCIRCA/CIALIS) 20 MG tablet, One tablet every 3 days as needed for erectile funmction (Patient taking differently: Take 20 mg by mouth daily as needed for erectile dysfunction. ), Disp: 15 tablet, Rfl: 1 .  triamterene-hydrochlorothiazide (MAXZIDE) 75-50 MG tablet, TAKE 1 TABLET BY MOUTH EVERY DAY, Disp: 90 tablet, Rfl: 1   Family History: family history includes  Cancer in his father; Diabetes in his brother; Hypertension in his brother, daughter, mother, sister, and son.  T  Social History:   reports that he quit smoking about 47 years ago. His smoking use included cigarettes. He has a 3.75 pack-year smoking history. He has never used smokeless tobacco. He reports current alcohol use. He reports that he does not use drugs.    Review of Systems: Constitutional: Denies weight loss/weight gain  Eyes: No changes in vision. ENT: No oral lesions,  sore throat.  GI: see HPI.  Heme/Lymph: No easy bruising.  CV: No chest pain.  GU: No hematuria.  Integumentary: No rashes.  Neuro: No headaches.  Psych: No depression/anxiety.  Endocrine: No heat/cold intolerance.  Allergic/Immunologic: No urticaria.  Resp: No cough, SOB.  Musculoskeletal: No joint swelling.    Physical Examination: BP 106/72 (BP Location: Right Arm, Patient Position: Sitting, Cuff Size: Large)   Pulse 72   Temp (!) 97 F (36.1 C) (Oral)   Ht 6\' 1"  (1.854 m)   Wt 173 lb 8 oz (78.7 kg)   BMI 22.89 kg/m  Gen: NAD, alert and oriented x 4 HEENT: PEERLA, EOMI, Neck: supple, no JVD Chest: CTA bilaterally, no wheezes, crackles, or other adventitious sounds CV: RRR, no m/g/c/r Abd: soft, NT, ND, +BS in all four quadrants; no HSM, guarding, ridigity, or rebound tenderness Ext: no edema, well perfused with 2+ pulses, Skin: no rash or lesions noted on observed skin Lymph: no noted LAD  Data: 09/2019 labs  Lab Results  Component Value Date   WBC 4.0 09/10/2019   HGB 13.9 09/10/2019   HCT 42.3 09/10/2019   MCV 83 09/10/2019   PLT 221 09/10/2019   No results for input(s): HGB in the last 168 hours. Lab Results  Component Value Date   NA 140 09/10/2019   K 3.6 09/10/2019   CL 99 09/10/2019   CO2 26 09/10/2019   BUN 14 09/10/2019   CREATININE 1.24 09/10/2019   Lab Results  Component Value Date   ALT 16 09/10/2019   AST 24 09/10/2019   ALKPHOS 56 09/10/2019   BILITOT 1.4 (H) 09/10/2019   RUQ [place] 10/2018--IMPRESSION: Unremarkable right upper quadrant ultrasound.No focal hepatic abnormality    Assessment/Plan: [name] is a 68 y.o. male   Bryce Martinez was seen today for follow-up.  Diagnoses and all orders for this visit:  History of hepatitis C  Family history of colon cancer    1.  History of hepatitis C-treated in 2016 with SVR confirmed.  Initial FibroScan showed F2 to 3 fibrosis in 2016.  We will plan for yearly ultrasound-consider  fibroscan in future as may have had fibrosis regression after treatment.  Minimal elevation in total bili above baseline on PCP labs October 2020, will repeat today and fractionate bili. He has been receiving yearly AFPs but this is now not covered by insurance and will assess for liver lesions on [place] initially and order accordingly if needed.    2. Family history of colon cancer-father-80s, recently had colonoscopy with small adenoma 08/2019.  Due 08/2024.  Will call pt w/ lab results.   I personally performed the service, non-incident to. (WP)  Laurine Blazer, PA-C [facility]  I agree with the above assessment and plan [facility] CRNP

## 2019-10-21 NOTE — Patient Instructions (Signed)
We are checking labs and yearly ultrasound today.  We will contact you with the results.  Please call us with any questions in interim.

## 2019-10-29 LAB — HCV RNA QUANT: Hepatitis C Quantitation: NOT DETECTED IU/mL

## 2019-11-01 ENCOUNTER — Other Ambulatory Visit: Payer: Self-pay

## 2019-11-01 ENCOUNTER — Ambulatory Visit (HOSPITAL_COMMUNITY)
Admission: RE | Admit: 2019-11-01 | Discharge: 2019-11-01 | Disposition: A | Discharge status: Home / Self Care | Payer: Medicare Other | Source: Ambulatory Visit | Attending: Gastroenterology | Admitting: Gastroenterology

## 2019-11-01 ENCOUNTER — Telehealth (INDEPENDENT_AMBULATORY_CARE_PROVIDER_SITE_OTHER): Payer: Self-pay | Admitting: Gastroenterology

## 2019-11-01 DIAGNOSIS — Z8619 Personal history of other infectious and parasitic diseases: Secondary | ICD-10-CM | POA: Diagnosis not present

## 2019-11-01 NOTE — Telephone Encounter (Signed)
I called [facility] , this is where the Hep C RNA Quant was sent. They had no order for Hepatic. I have requested that this be added on.They will see if they have enough, if not they will let us know. [facility] did not have the Hepatic either.

## 2019-11-01 NOTE — Telephone Encounter (Signed)
Bryce Martinez-at time of office visit we ordered an HCV RNA as well as a hepatic function panel.  The HCV RNA resulted but hepatic function panel does not show it is in process, can you check with lab on this. Thanks.

## 2019-11-23 LAB — HEPATIC FUNCTION PANEL

## 2019-11-23 LAB — SPECIMEN STATUS REPORT

## 2019-12-04 DIAGNOSIS — Z23 Encounter for immunization: Secondary | ICD-10-CM | POA: Diagnosis not present

## 2019-12-18 ENCOUNTER — Other Ambulatory Visit: Payer: Self-pay | Admitting: Family Medicine

## 2019-12-25 DIAGNOSIS — Z23 Encounter for immunization: Secondary | ICD-10-CM | POA: Diagnosis not present

## 2020-01-17 ENCOUNTER — Ambulatory Visit: Payer: Medicare Other | Admitting: Family Medicine

## 2020-01-22 ENCOUNTER — Other Ambulatory Visit: Payer: Self-pay

## 2020-01-22 ENCOUNTER — Encounter (HOSPITAL_COMMUNITY): Payer: Self-pay | Admitting: Emergency Medicine

## 2020-01-22 ENCOUNTER — Emergency Department (HOSPITAL_COMMUNITY)
Admission: EM | Admit: 2020-01-22 | Discharge: 2020-01-22 | Disposition: A | Discharge status: Home / Self Care | Payer: Medicare Other | Attending: Emergency Medicine | Admitting: Emergency Medicine

## 2020-01-22 ENCOUNTER — Emergency Department (HOSPITAL_COMMUNITY): Payer: Medicare Other

## 2020-01-22 DIAGNOSIS — S199XXA Unspecified injury of neck, initial encounter: Secondary | ICD-10-CM | POA: Diagnosis present

## 2020-01-22 DIAGNOSIS — Y9389 Activity, other specified: Secondary | ICD-10-CM | POA: Diagnosis not present

## 2020-01-22 DIAGNOSIS — S161XXA Strain of muscle, fascia and tendon at neck level, initial encounter: Secondary | ICD-10-CM

## 2020-01-22 DIAGNOSIS — Z79899 Other long term (current) drug therapy: Secondary | ICD-10-CM | POA: Diagnosis not present

## 2020-01-22 DIAGNOSIS — Y999 Unspecified external cause status: Secondary | ICD-10-CM | POA: Insufficient documentation

## 2020-01-22 DIAGNOSIS — Y92410 Unspecified street and highway as the place of occurrence of the external cause: Secondary | ICD-10-CM | POA: Insufficient documentation

## 2020-01-22 DIAGNOSIS — Z87891 Personal history of nicotine dependence: Secondary | ICD-10-CM | POA: Diagnosis not present

## 2020-01-22 DIAGNOSIS — S169XXA Unspecified injury of muscle, fascia and tendon at neck level, initial encounter: Secondary | ICD-10-CM | POA: Diagnosis not present

## 2020-01-22 DIAGNOSIS — M542 Cervicalgia: Secondary | ICD-10-CM | POA: Diagnosis not present

## 2020-01-22 DIAGNOSIS — I10 Essential (primary) hypertension: Secondary | ICD-10-CM | POA: Insufficient documentation

## 2020-01-22 MED ORDER — METHOCARBAMOL 500 MG PO TABS: 500.0000 mg | ORAL_TABLET | Freq: Three times a day (TID) | ORAL | 0 refills | Status: DC
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
## 2020-01-22 NOTE — Discharge Instructions (Addendum)
Apply ice packs on and off to your neck.  After 1 to 2 days you may alternate with heat if you choose.  Follow-up with your primary doctor for recheck.  You may take Tylenol every 4 hours if needed for pain.

## 2020-01-26 ENCOUNTER — Telehealth: Payer: Self-pay | Admitting: Family Medicine
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
## 2020-01-27 NOTE — Telephone Encounter (Signed)
Referall sent to [facility] in [place]

## 2020-03-24 ENCOUNTER — Other Ambulatory Visit: Payer: Self-pay | Admitting: Family Medicine

## 2020-05-04 ENCOUNTER — Encounter: Payer: Self-pay | Admitting: Family Medicine

## 2020-05-04 ENCOUNTER — Other Ambulatory Visit: Payer: Self-pay | Admitting: Family Medicine

## 2020-05-04 ENCOUNTER — Other Ambulatory Visit: Payer: Self-pay

## 2020-05-04 ENCOUNTER — Ambulatory Visit (INDEPENDENT_AMBULATORY_CARE_PROVIDER_SITE_OTHER): Payer: Medicare Other | Admitting: Family Medicine

## 2020-05-04 VITALS — BP 130/72 | HR 64 | Temp 97.9°F | Resp 16 | Ht 73.0 in | Wt 172.1 lb

## 2020-05-04 DIAGNOSIS — E559 Vitamin D deficiency, unspecified: Secondary | ICD-10-CM

## 2020-05-04 DIAGNOSIS — G8929 Other chronic pain: Secondary | ICD-10-CM

## 2020-05-04 DIAGNOSIS — E785 Hyperlipidemia, unspecified: Secondary | ICD-10-CM

## 2020-05-04 DIAGNOSIS — J301 Allergic rhinitis due to pollen: Secondary | ICD-10-CM | POA: Diagnosis not present

## 2020-05-04 DIAGNOSIS — I10 Essential (primary) hypertension: Secondary | ICD-10-CM

## 2020-05-04 DIAGNOSIS — N528 Other male erectile dysfunction: Secondary | ICD-10-CM

## 2020-05-04 DIAGNOSIS — M5441 Lumbago with sciatica, right side: Secondary | ICD-10-CM

## 2020-05-04 MED ORDER — TADALAFIL 20 MG PO TABS: ORAL_TABLET | ORAL | 3 refills | Status: DC

## 2020-05-04 MED ORDER — FLUTICASONE PROPIONATE 50 MCG/ACT NA SUSP: 2.0000 | Freq: Every day | NASAL | 5 refills | Status: DC

## 2020-05-04 NOTE — Patient Instructions (Addendum)
Wellness when due, please schedule.  MD f/u in January , call if you need me before  Please call back with name of Dr Dennis Bast want to give you the epidural Excellent blood pressure, no med change  Fasting CBC, lipid, cmp and EGFR, tSH and Vitt D today  Keep up good health habits!  Best for 2021 into 2022!  Enjoy the 4th!!! Thanks for choosing [facility], we consider it a privelige to serve you.

## 2020-05-05 ENCOUNTER — Encounter: Payer: Self-pay | Admitting: Family Medicine

## 2020-05-05 LAB — CBC/DIFF AMBIGUOUS DEFAULT
Basophils Absolute: 0 10*3/uL (ref 0.0–0.2)
Basos: 1 %
EOS (ABSOLUTE): 0.2 10*3/uL (ref 0.0–0.4)
Eos: 6 %
Hematocrit: 40.4 % (ref 37.5–51.0)
Hemoglobin: 13.3 g/dL (ref 13.0–17.7)
Immature Grans (Abs): 0 10*3/uL (ref 0.0–0.1)
Immature Granulocytes: 0 %
Lymphocytes Absolute: 1.5 10*3/uL (ref 0.7–3.1)
Lymphs: 40 %
MCH: 27.5 pg (ref 26.6–33.0)
MCHC: 32.9 g/dL (ref 31.5–35.7)
MCV: 84 fL (ref 79–97)
Monocytes Absolute: 0.4 10*3/uL (ref 0.1–0.9)
Monocytes: 9 %
Neutrophils Absolute: 1.7 10*3/uL (ref 1.4–7.0)
Neutrophils: 44 %
Platelets: 222 10*3/uL (ref 150–450)
RBC: 4.83 x10E6/uL (ref 4.14–5.80)
RDW: 13.5 % (ref 11.6–15.4)
WBC: 3.8 10*3/uL (ref 3.4–10.8)

## 2020-05-05 LAB — COMPREHENSIVE METABOLIC PANEL
ALT: 28 IU/L (ref 0–44)
AST: 28 IU/L (ref 0–40)
Albumin/Globulin Ratio: 1.5 (ref 1.2–2.2)
Albumin: 4.3 g/dL (ref 3.8–4.8)
Alkaline Phosphatase: 53 IU/L (ref 48–121)
BUN/Creatinine Ratio: 11 (ref 10–24)
BUN: 15 mg/dL (ref 8–27)
Bilirubin Total: 1.1 mg/dL (ref 0.0–1.2)
CO2: 28 mmol/L (ref 20–29)
Calcium: 9.2 mg/dL (ref 8.6–10.2)
Chloride: 101 mmol/L (ref 96–106)
Creatinine, Ser: 1.37 mg/dL — ABNORMAL HIGH (ref 0.76–1.27)
GFR calc Af Amer: 61 mL/min/{1.73_m2} (ref >59)
GFR calc non Af Amer: 53 mL/min/{1.73_m2} — ABNORMAL LOW (ref >59)
Globulin, Total: 2.8 g/dL (ref 1.5–4.5)
Glucose: 92 mg/dL (ref 65–99)
Potassium: 3.9 mmol/L (ref 3.5–5.2)
Sodium: 140 mmol/L (ref 134–144)
Total Protein: 7.1 g/dL (ref 6.0–8.5)

## 2020-05-05 LAB — LIPID PANEL W/O CHOL/HDL RATIO
Cholesterol, Total: 207 mg/dL — ABNORMAL HIGH (ref 100–199)
HDL: 60 mg/dL (ref >39)
LDL Chol Calc (NIH): 130 mg/dL — ABNORMAL HIGH (ref 0–99)
Triglycerides: 93 mg/dL (ref 0–149)
VLDL Cholesterol Cal: 17 mg/dL (ref 5–40)

## 2020-05-05 LAB — VITAMIN D 25 HYDROXY (VIT D DEFICIENCY, FRACTURES): Vit D, 25-Hydroxy: 11.2 ng/mL — ABNORMAL LOW (ref 30.0–100.0)

## 2020-05-05 LAB — SPECIMEN STATUS REPORT

## 2020-05-05 LAB — TSH: TSH: 2.25 u[IU]/mL (ref 0.450–4.500)

## 2020-05-05 NOTE — Assessment & Plan Note (Signed)
Increased and requests referral for epidural which has helped in the past. Currently very active in his garden as well as working on cars, refer to [facility] where he has had successful epidural before

## 2020-05-05 NOTE — Assessment & Plan Note (Signed)
Good response to cialis, continue same

## 2020-05-05 NOTE — Assessment & Plan Note (Signed)
Controlled, no change in medication  

## 2020-05-05 NOTE — Assessment & Plan Note (Signed)
Controlled, no change in medication DASH diet and commitment to daily physical activity for a minimum of 30 minutes discussed and encouraged, as a part of hypertension management. The importance of attaining a healthy weight is also discussed.  BP/Weight 05/04/2020 01/22/2020 10/21/2019 09/01/2019 08/05/2019 11/16/2018 36/62/9476  Systolic BP 546 503 546 568 127 517 001  Diastolic BP 72 86 72 76 70 70 80  Wt. (Lbs) 172.12 178 173.5 169 168.08 174.08 169.3  BMI 22.71 23.48 22.89 22.3 22.18 22.97 22.34

## 2020-05-05 NOTE — Progress Notes (Signed)
   Bryce Martinez     MRN: [id number]      DOB: 05/25/1951   HPI Bryce Martinez is here for follow up and re-evaluation of chronic medical conditions, medication management and review of any available recent lab and radiology data.  Preventive health is updated, specifically  Cancer screening and Immunization.   Questions or concerns regarding consultations or procedures which the PT has had in the interim are  addressed. The PT denies any adverse reactions to current medications since the last visit.  C/o increased lower back pain and stiffness, has  Benefited form epidural in the past and requests referral for another  ROS Denies recent fever or chills. Denies sinus pressure, nasal congestion, ear pain or sore throat. Denies chest congestion, productive cough or wheezing. Denies chest pains, palpitations and leg swelling Denies abdominal pain, nausea, vomiting,diarrhea or constipation.   Denies dysuria, frequency, hesitancy or incontinence. Denies headaches, seizures, numbness, or tingling. Denies depression, anxiety or insomnia. Denies skin break down or rash.   PE  BP 130/72   Pulse 64   Temp 97.9 F (36.6 C)   Resp 16   Ht 6\' 1"  (1.854 m)   Wt 172 lb 1.9 oz (78.1 kg)   SpO2 100%   BMI 22.71 kg/m   Patient alert and oriented and in no cardiopulmonary distress.  HEENT: No facial asymmetry, EOMI,     Neck supple .  Chest: Clear to auscultation bilaterally.  CVS: S1, S2 no murmurs, no S3.Regular rate.  ABD: Soft non tender.   Ext: No edema  MS: Decreased  ROM lumbar  Spine, adequate in  shoulders, hips and knees.  Skin: Intact, no ulcerations or rash noted.  Psych: Good eye contact, normal affect. Memory intact not anxious or depressed appearing.  CNS: CN 2-12 intact, power,  normal throughout.no focal deficits noted.   Assessment & Plan Essential hypertension Controlled, no change in medication DASH diet and commitment to daily physical activity for a  minimum of 30 minutes discussed and encouraged, as a part of hypertension management. The importance of attaining a healthy weight is also discussed.  BP/Weight 05/04/2020 01/22/2020 10/21/2019 09/01/2019 08/05/2019 11/16/2018 37/34/2876  Systolic BP 811 572 620 355 974 163 845  Diastolic BP 72 86 72 76 70 70 80  Wt. (Lbs) 172.12 178 173.5 169 168.08 174.08 169.3  BMI 22.71 23.48 22.89 22.3 22.18 22.97 22.34       Allergic rhinitis Controlled, no change in medication   ED (erectile dysfunction) Good response to cialis, continue same  Right-sided low back pain with right-sided sciatica Increased and requests referral for epidural which has helped in the past. Currently very active in his garden as well as working on cars, refer to [facility] where he has had successful epidural before

## 2020-05-09 ENCOUNTER — Telehealth: Payer: Self-pay | Admitting: Family Medicine

## 2020-05-09 NOTE — Telephone Encounter (Signed)
Patient left a voicemail stating he missed a call about lab work results and would like someone to call him back to go over results at mobile number [phone]

## 2020-05-09 NOTE — Telephone Encounter (Signed)
Pt advised of lab results with verbal understanding  

## 2020-05-10 ENCOUNTER — Other Ambulatory Visit: Payer: Self-pay | Admitting: Family Medicine

## 2020-05-10 ENCOUNTER — Encounter: Payer: Self-pay | Admitting: Family Medicine

## 2020-05-10 MED ORDER — ERGOCALCIFEROL 1.25 MG (50000 UT) PO CAPS: 50000.0000 [IU] | ORAL_CAPSULE | ORAL | 2 refills | Status: DC

## 2020-05-10 NOTE — Telephone Encounter (Signed)
Pt was advised vitamin D was at pharmacy however it doesn't look like this was sent in. Would you like to send in ?

## 2020-06-24 ENCOUNTER — Other Ambulatory Visit: Payer: Self-pay | Admitting: Family Medicine

## 2020-07-13 DIAGNOSIS — M48062 Spinal stenosis, lumbar region with neurogenic claudication: Secondary | ICD-10-CM | POA: Diagnosis not present

## 2020-07-13 DIAGNOSIS — I10 Essential (primary) hypertension: Secondary | ICD-10-CM | POA: Diagnosis not present

## 2020-07-31 DIAGNOSIS — Z23 Encounter for immunization: Secondary | ICD-10-CM | POA: Diagnosis not present

## 2020-08-02 ENCOUNTER — Ambulatory Visit (INDEPENDENT_AMBULATORY_CARE_PROVIDER_SITE_OTHER): Payer: Medicare Other

## 2020-08-02 ENCOUNTER — Other Ambulatory Visit: Payer: Self-pay

## 2020-08-02 DIAGNOSIS — Z23 Encounter for immunization: Secondary | ICD-10-CM

## 2020-08-08 ENCOUNTER — Encounter: Payer: Medicare Other | Admitting: Family Medicine

## 2020-08-15 DIAGNOSIS — M48062 Spinal stenosis, lumbar region with neurogenic claudication: Secondary | ICD-10-CM | POA: Diagnosis not present

## 2020-08-17 ENCOUNTER — Other Ambulatory Visit: Payer: Self-pay

## 2020-08-17 ENCOUNTER — Ambulatory Visit (INDEPENDENT_AMBULATORY_CARE_PROVIDER_SITE_OTHER): Payer: Medicare Other

## 2020-08-17 VITALS — BP 130/72 | HR 64 | Temp 97.9°F | Ht 73.0 in | Wt 173.0 lb

## 2020-08-17 DIAGNOSIS — Z0000 Encounter for general adult medical examination without abnormal findings: Secondary | ICD-10-CM

## 2020-08-17 NOTE — Progress Notes (Addendum)
Subjective:   Bryce Martinez is a 69 y.o. male who presents for Medicare Annual/Subsequent preventive examination.       Objective:    There were no vitals filed for this visit. There is no height or weight on file to calculate BMI.  Advanced Directives 09/01/2019 08/03/2018 07/10/2015 04/19/2014  Does Patient Have a Medical Advance Directive? No Yes No Patient does not have advance directive;Patient would not like information  Does patient want to make changes to medical advance directive? - Yes (ED - Information included in AVS) - -  Would patient like information on creating a medical advance directive? No - Patient declined - - -  Pre-existing out of facility DNR order (yellow form or pink MOST form) - - - No    Current Medications (verified) Outpatient Encounter Medications as of 08/17/2020  Medication Sig  . amLODipine (NORVASC) 2.5 MG tablet TAKE 1 TABLET BY MOUTH EVERY DAY  . ergocalciferol (VITAMIN D2) 1.25 MG (50000 UT) capsule Take 1 capsule (50,000 Units total) by mouth once a week. One capsule once weekly  . fluticasone (FLONASE) 50 MCG/ACT nasal spray Place 2 sprays into both nostrils daily.  . tadalafil (CIALIS) 20 MG tablet Take one tablet by mouth every 3 days, as needed, for erectile dysfunction  . triamterene-hydrochlorothiazide (MAXZIDE) 75-50 MG tablet TAKE 1 TABLET BY MOUTH EVERY DAY   No facility-administered encounter medications on file as of 08/17/2020.    Allergies (verified) Patient has no known allergies.   History: Past Medical History:  Diagnosis Date  . Allergy   . Arthritis   . ED (erectile dysfunction)   . Hepatitis C   . Hyperlipidemia   . Hypertension    Past Surgical History:  Procedure Laterality Date  . APPENDECTOMY    . CHOLECYSTECTOMY N/A 04/25/2014   Procedure: LAPAROSCOPIC CHOLECYSTECTOMY;  Surgeon: Jamesetta So, MD;  Location: [facility] ORS;  Service: General;  Laterality: N/A;  . COLONOSCOPY N/A 09/01/2019   Procedure:  COLONOSCOPY;  Surgeon: Rogene Houston, MD;  Location: [facility] ENDO SU[facility];  Service: Endoscopy;  Laterality: N/A;  930  . HEMICOLECTOMY Right    lap hand assisted  . POLYPECTOMY  09/01/2019   Procedure: POLYPECTOMY;  Surgeon: Rogene Houston, MD;  Location: [facility];  Service: Endoscopy;;  colon  . right shoulder surgery Right 03/2005   rotator cuff repair   Family History  Problem Relation Age of Onset  . Hypertension Mother   . Cancer Father        colon   . Hypertension Sister   . Hypertension Brother   . Diabetes Brother   . Hypertension Daughter   . Hypertension Son    Social History   Socioeconomic History  . Marital status: Married    Spouse name: Not on file  . Number of children: Not on file  . Years of education: Not on file  . Highest education level: Not on file  Occupational History  . Not on file  Tobacco Use  . Smoking status: Former Smoker    Packs/day: 0.25    Years: 15.00    Pack years: 3.75    Types: Cigarettes    Quit date: 04/19/1972    Years since quitting: 48.3  . Smokeless tobacco: Never Used  Vaping Use  . Vaping Use: Never used  Substance and Sexual Activity  . Alcohol use: Yes    Alcohol/week: 0.0 standard drinks    Comment: special occasions  . Drug use:  No  . Sexual activity: Yes    Birth control/protection: None  Other Topics Concern  . Not on file  Social History Narrative  . Not on file   Social Determinants of Health   Financial Resource Strain:   . Difficulty of Paying Living Expenses: Not on file  Food Insecurity:   . Worried About Charity fundraiser in the Last Year: Not on file  . Ran Out of Food in the Last Year: Not on file  Transportation Needs:   . Lack of Transportation (Medical): Not on file  . Lack of Transportation (Non-Medical): Not on file  Physical Activity:   . Days of Exercise per Week: Not on file  . Minutes of Exercise per Session: Not on file  Stress:   . Feeling of Stress : Not on file  Social  Connections:   . Frequency of Communication with Friends and Family: Not on file  . Frequency of Social Gatherings with Friends and Family: Not on file  . Attends Religious Services: Not on file  . Active Member of Clubs or Organizations: Not on file  . Attends Archivist Meetings: Not on file  . Marital Status: Not on file    Tobacco Counseling Counseling given: Not Answered   Clinical Intake:                 Diabetic? No          Activities of Daily Living No flowsheet data found.  Patient Care Team: Fayrene Helper, MD as PCP - General  Indicate any recent Medical Services you may have received from other than [facility] providers in the past year (date may be approximate).     Assessment:   This is a routine wellness examination for Bryce Martinez.  Hearing/Vision screen No exam data present  Dietary issues and exercise activities discussed:    Goals   None    Depression Screen PHQ 2/9 Scores 05/04/2020 08/05/2019 11/16/2018 08/03/2018 08/03/2018 06/03/2018 09/22/2017  PHQ - 2 Score 0 0 0 0 0 0 0  PHQ- 9 Score - - - - - - -    Fall Risk Fall Risk  05/04/2020 08/05/2019 11/16/2018 08/03/2018 08/03/2018  Falls in the past year? 0 0 0 No No  Number falls in past yr: - 0 0 - -  Injury with Fall? - 0 0 - -    Any stairs in or around the home? Yes  If so, are there any without handrails? No  Home free of loose throw rugs in walkways, pet beds, electrical cords, etc? Yes  Adequate lighting in your home to reduce risk of falls? Yes   ASSISTIVE DEVICES UTILIZED TO PREVENT FALLS:  Life alert? No  Use of a cane, walker or w/c? No  Grab bars in the bathroom? Yes  Shower chair or bench in shower? No  Elevated toilet seat or a handicapped toilet? No   TIMED UP AND GO:  Was the test performed? No .   Gait steady and fast without use of assistive device  Cognitive Function:     6CIT Screen 08/05/2019 08/03/2018  What Year? 0 points -  What month? 0  points -  What time? 0 points 0 points  Count back from 20 0 points 0 points  Months in reverse 0 points 0 points  Repeat phrase 0 points 0 points  Total Score 0 -    Immunizations Immunization History  Administered Date(s) Administered  . Fluad Quad(high Dose  65+) 07/19/2019, 08/02/2020  . Influenza,inj,Quad PF,6+ Mos 08/16/2014, 08/24/2015, 07/24/2016, 08/03/2018  . PFIZER SARS-COV-2 Vaccination 12/04/2019, 12/25/2019  . Pneumococcal Conjugate-13 09/22/2017  . Pneumococcal Polysaccharide-23 11/16/2018  . Td 06/14/2004  . Tdap 10/10/2014  . Zoster 07/22/2012    TDAP status: Up to date Flu Vaccine status: Up to date Pneumococcal vaccine status: Up to date Covid-19 vaccine status: Completed vaccines  Qualifies for Shingles Vaccine? Yes   Zostavax completed No   Shingrix Completed?: No.    Education has been provided regarding the importance of this vaccine. Patient has been advised to call insurance company to determine out of pocket expense if they have not yet received this vaccine. Advised may also receive vaccine at local pharmacy or Health Dept. Verbalized acceptance and understanding.  Screening Tests Health Maintenance  Topic Date Due  . COLONOSCOPY  08/31/2024  . TETANUS/TDAP  10/10/2024  . INFLUENZA VACCINE  Completed  . COVID-19 Vaccine  Completed  . Hepatitis C Screening  Completed  . PNA vac Low Risk Adult  Completed    Health Maintenance  There are no preventive care reminders to display for this patient.  Colonoscopy: Completed.   Lung Cancer Screening: (Low Dose CT Chest recommended if Age 11-80 years, 30 pack-year currently smoking OR have quit w/in 15years.) does not qualify.   Lung Cancer Screening Referral: n/a  Additional Screening:  Hepatitis C Screening: does qualify; Completed   Vision Screening: Recommended annual ophthalmology exams for early detection of glaucoma and other disorders of the eye. Is the patient up to date with their  annual eye exam?  No  Who is the provider or what is the name of the office in which the patient attends annual eye exams? [facility]  If pt is not established with a provider, would they like to be referred to a provider to establish care? Established and wife to scheduled appt soon.    Dental Screening: Recommended annual dental exams for proper oral hygiene  Community Resource Referral / Chronic Care Management: CRR required this visit?  No   CCM required this visit?  No      Plan:     I have personally reviewed and noted the following in the patient's chart:   . Medical and social history . Use of alcohol, tobacco or illicit drugs  . Current medications and supplements . Functional ability and status . Nutritional status . Physical activity . Advanced directives . List of other physicians . Hospitalizations, surgeries, and ER visits in previous 12 months . Vitals . Screenings to include cognitive, depression, and falls . Referrals and appointments  In addition, I have reviewed and discussed with patient certain preventive protocols, quality metrics, and best practice recommendations. A written personalized care plan for preventive services as well as general preventive health recommendations were provided to patient.     Lonn Georgia, LPN   [id number]   Nurse Notes: AWV conducted over the phone with pt at home, and provider in the office.

## 2020-09-26 ENCOUNTER — Other Ambulatory Visit: Payer: Self-pay | Admitting: Family Medicine

## 2020-11-16 ENCOUNTER — Encounter: Payer: Self-pay | Admitting: Family Medicine

## 2020-11-16 ENCOUNTER — Other Ambulatory Visit: Payer: Self-pay

## 2020-11-16 ENCOUNTER — Telehealth (INDEPENDENT_AMBULATORY_CARE_PROVIDER_SITE_OTHER): Payer: 59 | Admitting: Family Medicine

## 2020-11-16 VITALS — BP 137/60 | Ht 73.0 in | Wt 175.0 lb

## 2020-11-16 DIAGNOSIS — J3089 Other allergic rhinitis: Secondary | ICD-10-CM | POA: Diagnosis not present

## 2020-11-16 DIAGNOSIS — M5441 Lumbago with sciatica, right side: Secondary | ICD-10-CM | POA: Diagnosis not present

## 2020-11-16 DIAGNOSIS — I10 Essential (primary) hypertension: Secondary | ICD-10-CM | POA: Diagnosis not present

## 2020-11-16 DIAGNOSIS — E559 Vitamin D deficiency, unspecified: Secondary | ICD-10-CM

## 2020-11-16 DIAGNOSIS — N528 Other male erectile dysfunction: Secondary | ICD-10-CM | POA: Diagnosis not present

## 2020-11-16 DIAGNOSIS — G8929 Other chronic pain: Secondary | ICD-10-CM

## 2020-11-16 DIAGNOSIS — Z125 Encounter for screening for malignant neoplasm of prostate: Secondary | ICD-10-CM

## 2020-11-16 NOTE — Patient Instructions (Signed)
F/U in office with MD end August, call if you need me before  Please try to get the shingrix vaccines at your pharmacy as we discussed  Keep on working on good health habits, the effort does pay off  Fasting chem 7 and EGFR, PSA and vit D first week in pril  Continue current medications  It is important that you exercise regularly at least 30 minutes 5 times a week. If you develop chest pain, have severe difficulty breathing, or feel very tired, stop exercising immediately and seek medical attention  Think about what you will eat, plan ahead. Choose " clean, green, fresh or frozen" over canned, processed or packaged foods which are more sugary, salty and fatty. 70 to 75% of food eaten should be vegetables and fruit. Three meals at set times with snacks allowed between meals, but they must be fruit or vegetables. Aim to eat over a 12 hour period , example 7 am to 7 pm, and STOP after  your last meal of the day. Drink water,generally about 64 ounces per day, no other drink is as healthy. Fruit juice is best enjoyed in a healthy way, by EATING the fruit. Thanks for choosing [facility], we consider it a privelige to serve you. Best for 2022!

## 2020-11-16 NOTE — Assessment & Plan Note (Signed)
Controlled, no change in medication DASH diet and commitment to daily physical activity for a minimum of 30 minutes discussed and encouraged, as a part of hypertension management. The importance of attaining a healthy weight is also discussed.  BP/Weight 11/16/2020 08/17/2020 05/04/2020 01/22/2020 10/21/2019 09/01/2019 46/07/5071  Systolic BP 257 505 183 358 251 898 421  Diastolic BP 60 72 72 86 72 76 70  Wt. (Lbs) 175 173 172.12 178 173.5 169 168.08  BMI 23.09 22.82 22.71 23.48 22.89 22.3 22.18

## 2020-11-16 NOTE — Assessment & Plan Note (Signed)
No current flare uses nasal spray as needed with good effect

## 2020-11-16 NOTE — Assessment & Plan Note (Signed)
Controlled, no change in medication  

## 2020-11-16 NOTE — Progress Notes (Signed)
Virtual Visit via Telephone Note  I connected with Bryce Martinez on 11/16/20 at  1:20 PM EST by telephone and verified that I am speaking with the correct person using two identifiers.  Location: Patient: home Provider: work   I discussed the limitations, risks, security and privacy concerns of performing an evaluation and management service by telephone and the availability of in person appointments. I also discussed with the patient that there may be a patient responsible charge related to this service. The patient expressed understanding and agreed to proceed.   History of Present Illness: F/U chronic problems and routine health maintenance Denies recent fever or chills. Denies sinus pressure, nasal congestion, ear pain or sore throat. Denies chest congestion, productive cough or wheezing. Denies chest pains, palpitations and leg swelling Denies abdominal pain, nausea, vomiting,diarrhea or constipation.   Denies dysuria, frequency, hesitancy or incontinence. Denies joint pain, swelling and limitation in mobility.Great result from epidural in the Fall Denies headaches, seizures, numbness, or tingling. Denies depression, anxiety or insomnia. Denies skin break down or rash.       Observations/Objective: BP 137/60   Ht 6\' 1"  (1.854 m)   Wt 175 lb (79.4 kg)   BMI 23.09 kg/m    Assessment and Plan: Essential hypertension Controlled, no change in medication DASH diet and commitment to daily physical activity for a minimum of 30 minutes discussed and encouraged, as a part of hypertension management. The importance of attaining a healthy weight is also discussed.  BP/Weight 11/16/2020 08/17/2020 05/04/2020 01/22/2020 10/21/2019 09/01/2019 17/05/9389  Systolic BP 300 923 300 762 263 335 456  Diastolic BP 60 72 72 86 72 76 70  Wt. (Lbs) 175 173 172.12 178 173.5 169 168.08  BMI 23.09 22.82 22.71 23.48 22.89 22.3 22.18       ED (erectile dysfunction) Controlled, no change  in medication   Allergic rhinitis No current flare uses nasal spray as needed with good effect  Right-sided low back pain with right-sided sciatica Marked improvement with epidural    Follow Up Instructions:    I discussed the assessment and treatment plan with the patient. The patient was provided an opportunity to ask questions and all were answered. The patient agreed with the plan and demonstrated an understanding of the instructions.   The patient was advised to call back or seek an in-person evaluation if the symptoms worsen or if the condition fails to improve as anticipated.  I provided 20 minutes of non-face-to-face time during this encounter.   Tula Nakayama, MD

## 2020-11-16 NOTE — Addendum Note (Signed)
Addended by: Eual Fines on: 11/16/2020 02:21 PM   Modules accepted: Orders

## 2020-11-16 NOTE — Assessment & Plan Note (Signed)
Marked improvement with epidural

## 2020-12-01 ENCOUNTER — Other Ambulatory Visit: Payer: 59

## 2020-12-01 DIAGNOSIS — Z20822 Contact with and (suspected) exposure to covid-19: Secondary | ICD-10-CM

## 2020-12-03 LAB — NOVEL CORONAVIRUS, NAA: SARS-CoV-2, NAA: NOT DETECTED

## 2020-12-03 LAB — SARS-COV-2, NAA 2 DAY TAT

## 2020-12-27 ENCOUNTER — Other Ambulatory Visit: Payer: Self-pay | Admitting: Family Medicine

## 2021-02-05 ENCOUNTER — Other Ambulatory Visit: Payer: Self-pay | Admitting: Family Medicine

## 2021-03-30 ENCOUNTER — Other Ambulatory Visit: Payer: Self-pay | Admitting: Family Medicine

## 2021-06-18 ENCOUNTER — Other Ambulatory Visit: Payer: Self-pay | Admitting: Family Medicine

## 2021-06-28 ENCOUNTER — Encounter (INDEPENDENT_AMBULATORY_CARE_PROVIDER_SITE_OTHER): Payer: Self-pay | Admitting: *Deleted

## 2021-06-28 ENCOUNTER — Other Ambulatory Visit: Payer: Self-pay

## 2021-06-28 ENCOUNTER — Ambulatory Visit (INDEPENDENT_AMBULATORY_CARE_PROVIDER_SITE_OTHER): Payer: Medicare Other | Admitting: Family Medicine

## 2021-06-28 ENCOUNTER — Encounter: Payer: Self-pay | Admitting: Family Medicine

## 2021-06-28 VITALS — BP 150/82 | HR 55 | Resp 15 | Ht 73.0 in | Wt 167.0 lb

## 2021-06-28 DIAGNOSIS — Z125 Encounter for screening for malignant neoplasm of prostate: Secondary | ICD-10-CM

## 2021-06-28 DIAGNOSIS — I10 Essential (primary) hypertension: Secondary | ICD-10-CM | POA: Diagnosis not present

## 2021-06-28 DIAGNOSIS — R768 Other specified abnormal immunological findings in serum: Secondary | ICD-10-CM

## 2021-06-28 DIAGNOSIS — R634 Abnormal weight loss: Secondary | ICD-10-CM

## 2021-06-28 DIAGNOSIS — Z23 Encounter for immunization: Secondary | ICD-10-CM | POA: Diagnosis not present

## 2021-06-28 DIAGNOSIS — B1920 Unspecified viral hepatitis C without hepatic coma: Secondary | ICD-10-CM

## 2021-06-28 DIAGNOSIS — D126 Benign neoplasm of colon, unspecified: Secondary | ICD-10-CM | POA: Diagnosis not present

## 2021-06-28 DIAGNOSIS — E785 Hyperlipidemia, unspecified: Secondary | ICD-10-CM

## 2021-06-28 DIAGNOSIS — Z800 Family history of malignant neoplasm of digestive organs: Secondary | ICD-10-CM

## 2021-06-28 DIAGNOSIS — E559 Vitamin D deficiency, unspecified: Secondary | ICD-10-CM

## 2021-06-28 NOTE — Progress Notes (Signed)
   Bryce Martinez     MRN: [id number]      DOB: May 22, 1951   HPI Mr. Kwiatkowski is here for follow up and re-evaluation of chronic medical conditions, medication management and review of any available recent lab and radiology data.  Preventive health is updated, specifically  Cancer screening and Immunization.    The PT denies any adverse reactions to current medications since the last visit.  Reports decreased appetite and has lost weight since last visit   ROS Denies recent fever or chills. Denies sinus pressure, nasal congestion, ear pain or sore throat. Denies chest congestion, productive cough or wheezing. Denies chest pains, palpitations and leg swelling Denies abdominal pain, nausea, vomiting,diarrhea or constipation.   Denies dysuria, frequency, hesitancy or incontinence. Denies joint pain, swelling and limitation in mobility. Denies headaches, seizures, numbness, or tingling. Denies depression, anxiety or insomnia. Denies skin break down or rash.   PE  BP (!) 150/82   Pulse (!) 55   Resp 15   Ht '6\' 1"'$  (1.854 m)   Wt 167 lb (75.8 kg)   SpO2 98%   BMI 22.03 kg/m   Patient alert and oriented and in no cardiopulmonary distress.  HEENT: No facial asymmetry, EOMI,     Neck supple .  Chest: Clear to auscultation bilaterally.  CVS: S1, S2 no murmurs, no S3.Regular rate.  ABD: Soft non tender.   Ext: No edema  MS: Adequate ROM spine, shoulders, hips and knees.  Skin: Intact, no ulcerations or rash noted.  Psych: Good eye contact, normal affect. Memory intact not anxious or depressed appearing.  CNS: CN 2-12 intact, power,  normal throughout.no focal deficits noted.   Assessment & Plan  Essential hypertension Uncontrolled, no med change, reduce salt , re eval in 2 months DASH diet and commitment to daily physical activity for a minimum of 30 minutes discussed and encouraged, as a part of hypertension management. The importance of attaining a healthy weight  is also discussed.  BP/Weight 06/28/2021 11/16/2020 08/17/2020 05/04/2020 01/22/2020 10/21/2019 [id number]  Systolic BP Q000111Q 0000000 [id number]  Diastolic BP 82 60 72 72 86 72 76  Wt. (Lbs) 167 175 173 172.12 178 173.5 169  BMI 22.03 23.09 22.82 22.71 23.48 22.89 22.3       Dyslipidemia Hyperlipidemia:Low fat diet discussed and encouraged.   Lipid Panel  Lab Results  Component Value Date   CHOL 215 (H) 06/28/2021   HDL 68 06/28/2021   LDLCALC 131 (H) 06/28/2021   TRIG 90 06/28/2021   CHOLHDL 3.2 06/28/2021   Needs to reduce fried and fatty foods    Hepatitis C antibody test positive Needs US liver and annual GI eval, both are past due  Family history of colon cancer Reports reduced appetite and has ahd some unintentional weight loss, GI to eval, will f/u in 2 months in office

## 2021-06-28 NOTE — Patient Instructions (Addendum)
Annual exam in office with MD re eval bP , and  flu vaccine, and weight, Oct 15 or shortly after  No new meds, but concerned about bP, please change chips to food  Labs today, cBC, cmp and eGFr, TSH, lipid, PSA and Vit D  You are referred for [place] of liver and to see GI Specialist  Please get shingrix # 2 at your pharmacy, this is past due  Thanks for choosing [facility], we consider it a privelige to serve you.

## 2021-06-29 ENCOUNTER — Other Ambulatory Visit: Payer: Self-pay | Admitting: Family Medicine

## 2021-06-29 LAB — CBC
Hematocrit: 38.3 % (ref 37.5–51.0)
Hemoglobin: 13.4 g/dL (ref 13.0–17.7)
MCH: 28.5 pg (ref 26.6–33.0)
MCHC: 35 g/dL (ref 31.5–35.7)
MCV: 82 fL (ref 79–97)
Platelets: 230 10*3/uL (ref 150–450)
RBC: 4.7 x10E6/uL (ref 4.14–5.80)
RDW: 13.1 % (ref 11.6–15.4)
WBC: 4.8 10*3/uL (ref 3.4–10.8)

## 2021-06-29 LAB — LIPID PANEL
Chol/HDL Ratio: 3.2 ratio (ref 0.0–5.0)
Cholesterol, Total: 215 mg/dL — ABNORMAL HIGH (ref 100–199)
HDL: 68 mg/dL (ref >39)
LDL Chol Calc (NIH): 131 mg/dL — ABNORMAL HIGH (ref 0–99)
Triglycerides: 90 mg/dL (ref 0–149)
VLDL Cholesterol Cal: 16 mg/dL (ref 5–40)

## 2021-06-29 LAB — CMP14+EGFR
ALT: 27 IU/L (ref 0–44)
AST: 30 IU/L (ref 0–40)
Albumin/Globulin Ratio: 1.8 (ref 1.2–2.2)
Albumin: 4.8 g/dL (ref 3.8–4.8)
Alkaline Phosphatase: 52 IU/L (ref 44–121)
BUN/Creatinine Ratio: 12 (ref 10–24)
BUN: 17 mg/dL (ref 8–27)
Bilirubin Total: 1.3 mg/dL — ABNORMAL HIGH (ref 0.0–1.2)
CO2: 29 mmol/L (ref 20–29)
Calcium: 10.9 mg/dL — ABNORMAL HIGH (ref 8.6–10.2)
Chloride: 99 mmol/L (ref 96–106)
Creatinine, Ser: 1.37 mg/dL — ABNORMAL HIGH (ref 0.76–1.27)
Globulin, Total: 2.7 g/dL (ref 1.5–4.5)
Glucose: 81 mg/dL (ref 65–99)
Potassium: 4.3 mmol/L (ref 3.5–5.2)
Sodium: 140 mmol/L (ref 134–144)
Total Protein: 7.5 g/dL (ref 6.0–8.5)
eGFR: 56 mL/min/{1.73_m2} — ABNORMAL LOW (ref >59)

## 2021-06-29 LAB — PSA: Prostate Specific Ag, Serum: 1.4 ng/mL (ref 0.0–4.0)

## 2021-06-29 LAB — VITAMIN D 25 HYDROXY (VIT D DEFICIENCY, FRACTURES): Vit D, 25-Hydroxy: 31.7 ng/mL (ref 30.0–100.0)

## 2021-06-29 LAB — TSH: TSH: 1.97 u[IU]/mL (ref 0.450–4.500)

## 2021-06-30 ENCOUNTER — Encounter: Payer: Self-pay | Admitting: Family Medicine

## 2021-06-30 ENCOUNTER — Other Ambulatory Visit: Payer: Self-pay | Admitting: Family Medicine

## 2021-06-30 NOTE — Assessment & Plan Note (Signed)
Needs US liver and annual GI eval, both are past due

## 2021-06-30 NOTE — Assessment & Plan Note (Signed)
Reports reduced appetite and has ahd some unintentional weight loss, GI to eval, will f/u in 2 months in office

## 2021-06-30 NOTE — Assessment & Plan Note (Signed)
Uncontrolled, no med change, reduce salt , re eval in 2 months DASH diet and commitment to daily physical activity for a minimum of 30 minutes discussed and encouraged, as a part of hypertension management. The importance of attaining a healthy weight is also discussed.  BP/Weight 06/28/2021 11/16/2020 08/17/2020 05/04/2020 01/22/2020 10/21/2019 [id number]  Systolic BP Q000111Q 0000000 [id number]  Diastolic BP 82 60 72 72 86 72 76  Wt. (Lbs) 167 175 173 172.12 178 173.5 169  BMI 22.03 23.09 22.82 22.71 23.48 22.89 22.3

## 2021-06-30 NOTE — Assessment & Plan Note (Signed)
Hyperlipidemia:Low fat diet discussed and encouraged.   Lipid Panel  Lab Results  Component Value Date   CHOL 215 (H) 06/28/2021   HDL 68 06/28/2021   LDLCALC 131 (H) 06/28/2021   TRIG 90 06/28/2021   CHOLHDL 3.2 06/28/2021   Needs to reduce fried and fatty foods

## 2021-07-08 ENCOUNTER — Other Ambulatory Visit: Payer: Self-pay

## 2021-07-08 ENCOUNTER — Ambulatory Visit
Admission: EM | Admit: 2021-07-08 | Discharge: 2021-07-08 | Disposition: A | Discharge status: Home / Self Care | Payer: Medicare Other | Attending: Internal Medicine | Admitting: Internal Medicine

## 2021-07-08 ENCOUNTER — Encounter: Payer: Self-pay | Admitting: Emergency Medicine

## 2021-07-08 DIAGNOSIS — R21 Rash and other nonspecific skin eruption: Secondary | ICD-10-CM | POA: Diagnosis not present

## 2021-07-08 MED ORDER — TRIAMCINOLONE ACETONIDE 0.1 % EX CREA: 1.0000 "application " | TOPICAL_CREAM | Freq: Two times a day (BID) | CUTANEOUS | 0 refills | Status: DC

## 2021-07-08 MED ORDER — MUPIROCIN CALCIUM 2 % EX CREA: 1.0000 "application " | TOPICAL_CREAM | Freq: Two times a day (BID) | CUTANEOUS | 0 refills | Status: DC

## 2021-07-08 NOTE — Discharge Instructions (Addendum)
Please apply the medications as prescribed If symptoms worsen, please return to urgent care for re-evaluation

## 2021-07-08 NOTE — ED Provider Notes (Signed)
[facility]    CSN: [id number] Arrival date & time: 07/08/21  0927      History   Chief Complaint No chief complaint on file.   HPI Bryce Martinez is a 70 y.o. male comes to the urgent care with complaint of rash on both feet and legs.  Patient noticed the rash on Wednesday and it seems to have spread to involve his legs as well.  He has a few rashes on his torso.  No fever or chills.  No contact with anyone with similar rash.  No fever or chills.  No change in cosmetics, lotion.  No contact with poison ivy or poison oak.  The rash is sometimes itchy and tender to touch.   HPI  Past Medical History:  Diagnosis Date   Allergy    Arthritis    ED (erectile dysfunction)    Hepatitis C    Hyperlipidemia    Hypertension     Patient Active Problem List   Diagnosis Date Noted   Family history of colon cancer 06/28/2019   Hearing loss 06/03/2018   History of hepatitis C 07/26/2016   Right-sided low back pain with right-sided sciatica 07/26/2016   Hepatitis C antibody test positive 10/10/2014   Allergic rhinitis 07/11/2011   ED (erectile dysfunction) 07/11/2011   Dyslipidemia 01/13/2008   Essential hypertension 01/13/2008    Past Surgical History:  Procedure Laterality Date   APPENDECTOMY     CHOLECYSTECTOMY N/A 04/25/2014   Procedure: LAPAROSCOPIC CHOLECYSTECTOMY;  Surgeon: Jamesetta So, MD;  Location: [facility];  Service: General;  Laterality: N/A;   COLONOSCOPY N/A 09/01/2019   Procedure: COLONOSCOPY;  Surgeon: Rogene Houston, MD;  Location: [facility];  Service: Endoscopy;  Laterality: N/A;  930   HEMICOLECTOMY Right    lap hand assisted   POLYPECTOMY  09/01/2019   Procedure: POLYPECTOMY;  Surgeon: Rogene Houston, MD;  Location: [facility];  Service: Endoscopy;;  colon   right shoulder surgery Right 03/2005   rotator cuff repair       Home Medications    Prior to Admission medications   Medication Sig Start Date End Date Taking?  Authorizing Provider  mupirocin cream (BACTROBAN) 2 % Apply 1 application topically 2 (two) times daily. 07/08/21  Yes Yaret Hush, Myrene Galas, MD  triamcinolone cream (KENALOG) 0.1 % Apply 1 application topically 2 (two) times daily. 07/08/21  Yes Piera Downs, Myrene Galas, MD  amLODipine (NORVASC) 2.5 MG tablet TAKE 1 TABLET BY MOUTH EVERY DAY 06/29/21   Fayrene Helper, MD  fluticasone [facility]) 50 MCG/ACT nasal spray SPRAY 2 SPRAYS INTO EACH NOSTRIL EVERY DAY 06/19/21   Fayrene Helper, MD  Multiple Vitamin (MULTIVITAMIN) tablet Take 1 tablet by mouth daily.    [provider]  tadalafil (CIALIS) 20 MG tablet Take one tablet by mouth every 3 days, as needed, for erectile dysfunction 05/04/20   Fayrene Helper, MD  triamterene-hydrochlorothiazide (MAXZIDE) 75-50 MG tablet TAKE 1 TABLET BY MOUTH EVERY DAY 03/30/21   Fayrene Helper, MD    Family History Family History  Problem Relation Age of Onset   Hypertension Mother    Cancer Father        colon    Hypertension Sister    Hypertension Brother    Diabetes Brother    Hypertension Daughter    Hypertension Son     Social History Social History   Tobacco Use   Smoking status: Former    Packs/day: 0.25  Years: 15.00    Pack years: 3.75    Types: Cigarettes    Quit date: 04/19/1972    Years since quitting: 49.2   Smokeless tobacco: Never  Vaping Use   Vaping Use: Never used  Substance Use Topics   Alcohol use: Yes    Alcohol/week: 0.0 standard drinks    Comment: special occasions   Drug use: No     Allergies   Patient has no known allergies.   Review of Systems Review of Systems  Cardiovascular: Negative.   Gastrointestinal: Negative.   Musculoskeletal:  Negative for arthralgias.  Skin:  Positive for rash. Negative for color change and wound.    Physical Exam Triage Vital Signs ED Triage Vitals  Enc Vitals Group     BP 07/08/21 1028 (!) 146/87     Pulse Rate 07/08/21 1028 62     Resp 07/08/21 1028 16      Temp 07/08/21 1028 97.7 F (36.5 C)     Temp Source 07/08/21 1028 Oral     SpO2 07/08/21 1028 97 %     Weight --      Height --      Head Circumference --      Peak Flow --      Pain Score 07/08/21 1030 4     Pain Loc --      Pain Edu? --      Excl. in [place]? --    No data found.  Updated Vital Signs BP (!) 146/87 (BP Location: Right Arm)   Pulse 62   Temp 97.7 F (36.5 C) (Oral)   Resp 16   SpO2 97%   Visual Acuity Right Eye Distance:   Left Eye Distance:   Bilateral Distance:    Right Eye Near:   Left Eye Near:    Bilateral Near:     Physical Exam Vitals and nursing note reviewed.  Constitutional:      General: He is not in acute distress.    Appearance: He is not ill-appearing.  Skin:    Comments: Multiple vesicular and pustular rashes mainly on the lower extremities.  No surrounding erythema.  No excoriations.  Neurological:     Mental Status: He is alert.     UC Treatments / Results  Labs (all labs ordered are listed, but only abnormal results are displayed) Labs Reviewed - No data to display  EKG   Radiology No results found.  Procedures Procedures (including critical care time)  Medications Ordered in UC Medications - No data to display  Initial Impression / Assessment and Plan / UC Course  I have reviewed the triage vital signs and the nursing notes.  Pertinent labs & imaging results that were available during my care of the patient were reviewed by me and considered in my medical decision making (see chart for details).     1.  Rash: Triamcinolone cream Mupirocin cream If symptoms worsen please return to the urgent care This could be some form of contact dermatitis. Final Clinical Impressions(s) / UC Diagnoses   Final diagnoses:  Rash and nonspecific skin eruption     Discharge Instructions      Please apply the medications as prescribed If symptoms worsen, please return to urgent care for re-evaluation   ED Prescriptions      Medication Sig Dispense Auth. Provider   triamcinolone cream (KENALOG) 0.1 % Apply 1 application topically 2 (two) times daily. 30 g [name], Myrene Galas, MD   mupirocin cream [name])  2 % Apply 1 application topically 2 (two) times daily. 15 g [name], Myrene Galas, MD      PDMP not reviewed this encounter.   Chase Picket, MD 07/08/21 [phone]

## 2021-07-08 NOTE — ED Triage Notes (Signed)
Raised bumps that started on feet, now have moved to legs, torso and arms.  Bumps itch sometimes and tender to the touch.  Noticed bumps on feet on Wednesday.

## 2021-07-10 ENCOUNTER — Other Ambulatory Visit: Payer: Self-pay

## 2021-07-10 ENCOUNTER — Ambulatory Visit (HOSPITAL_COMMUNITY)
Admission: RE | Admit: 2021-07-10 | Discharge: 2021-07-10 | Disposition: A | Discharge status: Home / Self Care | Payer: Medicare Other | Source: Ambulatory Visit | Attending: Family Medicine | Admitting: Family Medicine

## 2021-07-10 DIAGNOSIS — R768 Other specified abnormal immunological findings in serum: Secondary | ICD-10-CM | POA: Insufficient documentation

## 2021-07-10 NOTE — Telephone Encounter (Signed)
Noted  

## 2021-07-11 ENCOUNTER — Other Ambulatory Visit: Payer: Self-pay | Admitting: Family Medicine

## 2021-07-11 DIAGNOSIS — D126 Benign neoplasm of colon, unspecified: Secondary | ICD-10-CM

## 2021-07-11 DIAGNOSIS — R634 Abnormal weight loss: Secondary | ICD-10-CM

## 2021-08-06 ENCOUNTER — Encounter: Payer: Self-pay | Admitting: "Endocrinology

## 2021-08-06 ENCOUNTER — Other Ambulatory Visit: Payer: Self-pay

## 2021-08-06 ENCOUNTER — Ambulatory Visit: Payer: Medicare Other | Admitting: "Endocrinology

## 2021-08-06 MED ORDER — LOSARTAN POTASSIUM 50 MG PO TABS: 50.0000 mg | ORAL_TABLET | Freq: Every day | ORAL | 1 refills | Status: DC

## 2021-08-06 MED ORDER — AMLODIPINE BESYLATE 5 MG PO TABS: 5.0000 mg | ORAL_TABLET | Freq: Every day | ORAL | 1 refills | Status: DC

## 2021-08-06 NOTE — Progress Notes (Signed)
Endocrinology Consult Note                                            08/06/2021, 3:44 PM   Subjective:    Patient ID: Bryce Martinez, male    DOB: 07-Jun-1951, PCP Fayrene Helper, MD   Past Medical History:  Diagnosis Date   Allergy    Arthritis    ED (erectile dysfunction)    Hepatitis C    Hyperlipidemia    Hypertension    Past Surgical History:  Procedure Laterality Date   APPENDECTOMY     CHOLECYSTECTOMY N/A 04/25/2014   Procedure: LAPAROSCOPIC CHOLECYSTECTOMY;  Surgeon: Jamesetta So, MD;  Location: [facility];  Service: General;  Laterality: N/A;   COLONOSCOPY N/A 09/01/2019   Procedure: COLONOSCOPY;  Surgeon: Rogene Houston, MD;  Location: [facility];  Service: Endoscopy;  Laterality: N/A;  930   HEMICOLECTOMY Right    lap hand assisted   POLYPECTOMY  09/01/2019   Procedure: POLYPECTOMY;  Surgeon: Rogene Houston, MD;  Location: [facility];  Service: Endoscopy;;  colon   right shoulder surgery Right 03/2005   rotator cuff repair   Social History   Socioeconomic History   Marital status: Married    Spouse name: Not on file   Number of children: Not on file   Years of education: Not on file   Highest education level: Not on file  Occupational History   Not on file  Tobacco Use   Smoking status: Former    Packs/day: 0.25    Years: 15.00    Pack years: 3.75    Types: Cigarettes    Quit date: 04/19/1972    Years since quitting: 49.3   Smokeless tobacco: Never  Vaping Use   Vaping Use: Never used  Substance and Sexual Activity   Alcohol use: Yes    Alcohol/week: 0.0 standard drinks    Comment: special occasions   Drug use: No   Sexual activity: Yes    Birth control/protection: None  Other Topics Concern   Not on file  Social History Narrative   Not on file   Social Determinants of Health   Financial Resource Strain: Not on file  Food Insecurity: Not on file  Transportation Needs: Not on file  Physical Activity: Not on file   Stress: Not on file  Social Connections: Not on file   Family History  Problem Relation Age of Onset   Hypertension Mother    Cancer Father        colon    Hypertension Sister    Hypertension Brother    Diabetes Brother    Hypertension Daughter    Hypertension Son    Outpatient Encounter Medications as of 08/06/2021  Medication Sig   losartan (COZAAR) 50 MG tablet Take 1 tablet (50 mg total) by mouth daily.   amLODipine (NORVASC) 5 MG tablet Take 1 tablet (5 mg total) by mouth daily.   fluticasone (FLONASE) 50 MCG/ACT nasal spray SPRAY 2 SPRAYS INTO EACH NOSTRIL EVERY DAY   Multiple Vitamin (MULTIVITAMIN) tablet Take 1 tablet by mouth daily. (Patient not taking: Reported on 08/06/2021)   mupirocin cream (BACTROBAN) 2 % Apply 1 application topically 2 (two) times daily.   tadalafil (CIALIS) 20 MG tablet Take one tablet by mouth every 3 days, as needed, for erectile  dysfunction   triamcinolone cream (KENALOG) 0.1 % Apply 1 application topically 2 (two) times daily.   [DISCONTINUED] amLODipine (NORVASC) 2.5 MG tablet TAKE 1 TABLET BY MOUTH EVERY DAY   [DISCONTINUED] triamterene-hydrochlorothiazide (MAXZIDE) 75-50 MG tablet TAKE 1 TABLET BY MOUTH EVERY DAY   No facility-administered encounter medications on file as of 08/06/2021.   ALLERGIES: No Known Allergies  VACCINATION STATUS: Immunization History  Administered Date(s) Administered   Fluad Quad(high Dose 65+) 07/19/2019, 08/02/2020, 06/28/2021   Influenza,inj,Quad PF,6+ Mos 08/16/2014, 08/24/2015, 07/24/2016, 08/03/2018   PFIZER(Purple Top)SARS-COV-2 Vaccination 12/04/2019, 12/25/2019, 08/23/2020   Pneumococcal Conjugate-13 09/22/2017   Pneumococcal Polysaccharide-23 11/16/2018   Td 06/14/2004   Tdap 10/10/2014   Zoster, Live 07/22/2012    HPI Bryce Martinez is 70 y.o. male who presents today with a medical history as above. he is being seen in consultation for hypercalcemia requested by Fayrene Helper, MD.   History is obtained directly from the patient as well as chart review.  He denies any prior history of parathyroid/thyroid dysfunction.  Admittedly, he has been taking multivitamins for several years until he was told he has high calcium and when he stopped.  He denies any history of nephrolithiasis, seizure disorder, nor osteoporosis.  He was found to have high calcium of 10.9 at one of his several lab draws, no associated PTH.  -He has been on Maxide which has 50 mg of hydrochlorothiazide which he took for several years for blood pressure management. He denies any family history of parathyroid dysfunction.  Patient is not a smoker.  Is also on amlodipine for blood pressure management.  Review of Systems  Constitutional: no recent weight gain/loss, no fatigue, no subjective hyperthermia, no subjective hypothermia Eyes: no blurry vision, no xerophthalmia ENT: no sore throat, no nodules palpated in throat, no dysphagia/odynophagia, no hoarseness Cardiovascular: no Chest Pain, no Shortness of Breath, no palpitations, no leg swelling Respiratory: no cough, no shortness of breath Gastrointestinal: no Nausea/Vomiting/Diarhhea Musculoskeletal: no muscle/joint aches Skin: no rashes Neurological: no tremors, no numbness, no tingling, no dizziness Psychiatric: no depression, no anxiety  Objective:    Vitals with BMI 08/06/2021 07/08/2021 06/28/2021  Height 6\' 1"  - -  Weight 174 lbs 6 oz - -  BMI 74.94 - -  Systolic 496 759 163  Diastolic 72 87 82  Pulse 68 62 -    BP 130/72   Pulse 68   Ht 6\' 1"  (1.854 m)   Wt 174 lb 6.4 oz (79.1 kg)   BMI 23.01 kg/m   Wt Readings from Last 3 Encounters:  08/06/21 174 lb 6.4 oz (79.1 kg)  06/28/21 167 lb (75.8 kg)  11/16/20 175 lb (79.4 kg)    Physical Exam  Constitutional:  Body mass index is 23.01 kg/m.,  not in acute distress, normal state of mind Eyes: PERRLA, EOMI, no exophthalmos ENT: moist mucous membranes, no gross thyromegaly, no gross  cervical lymphadenopathy Cardiovascular: normal precordial activity, Regular Rate and Rhythm, no Murmur/Rubs/Gallops Respiratory:  adequate breathing efforts, no gross chest deformity, Clear to auscultation bilaterally Gastrointestinal: abdomen soft, Non -tender, No distension, Bowel Sounds present, no gross organomegaly Musculoskeletal: no gross deformities, strength intact in all four extremities Skin: moist, warm, no rashes Neurological: no tremor with outstretched hands, Deep tendon reflexes normal in bilateral lower extremities.  CMP ( most recent) CMP     Component Value Date/Time   NA 140 06/28/2021 1418   K 4.3 06/28/2021 1418   CL 99 06/28/2021 1418   CO2 29  06/28/2021 1418   GLUCOSE 81 06/28/2021 1418   GLUCOSE 73 07/24/2016 1427   BUN 17 06/28/2021 1418   CREATININE 1.37 (H) 06/28/2021 1418   CREATININE 1.42 (H) 07/24/2016 1427   CALCIUM 10.9 (H) 06/28/2021 1418   PROT 7.5 06/28/2021 1418   ALBUMIN 4.8 06/28/2021 1418   AST 30 06/28/2021 1418   ALT 27 06/28/2021 1418   ALKPHOS 52 06/28/2021 1418   BILITOT 1.3 (H) 06/28/2021 1418   GFRNONAA 53 (L) 05/04/2020 1406   GFRNONAA 55 (L) 01/09/2012 0905   GFRAA 61 05/04/2020 1406   GFRAA 64 01/09/2012 0905     Diabetic Labs (most recent): Lab Results  Component Value Date   HGBA1C 5.5 09/16/2017   HGBA1C 5.8 (H) 05/20/2015   HGBA1C 5.7 (H) 10/22/2014     Lipid Panel ( most recent) Lipid Panel     Component Value Date/Time   CHOL 215 (H) 06/28/2021 1418   TRIG 90 06/28/2021 1418   HDL 68 06/28/2021 1418   CHOLHDL 3.2 06/28/2021 1418   CHOLHDL 3.0 05/20/2015 1010   VLDL 10 05/20/2015 1010   LDLCALC 131 (H) 06/28/2021 1418   LABVLDL 16 06/28/2021 1418      Lab Results  Component Value Date   TSH 1.970 06/28/2021   TSH 2.250 05/04/2020   TSH 1.690 01/10/2017   TSH 1.93 12/19/2015   TSH 2.292 10/22/2014   TSH 2.322 09/24/2013   TSH 4.528 (H) 01/09/2012   TSH 1.241 01/09/2011   TSH 1.241 01/09/2011    FREET4 1.22 01/09/2012           Assessment & Plan:   1. Hypercalcemia   - Bryce Martinez  is being seen at a kind request of Fayrene Helper, MD. - I have reviewed his available endocrine records and clinically evaluated the patient. - Based on these reviews, he has hypercalcemia of 10.9,  however,  there is not sufficient information to proceed with definitive treatment plan.  - he will need a repeat,  more complete work-up towards confirming the diagnosis.  -However, patient will need to be off of high-dose hydrochlorothiazide before lab work is repeated.  I discussed and discontinue his Maxide, prescribed losartan 50 mg p.o. daily and increase his amlodipine to 10 mg p.o. daily for blood pressure management.  He will have his labs repeated in 3 months with office visit including PTH/calcium, magnesium, phosphorus. -He does not need immediate intervention.  If he returns with persistent hypercalcemia, he will have 24-hour urine calcium measurement, and bone density. If he is to be confirmed for primary hyperparathyroidism/hypercalcemia, he will be a surgical candidate.   - I did not initiate any new prescriptions today. - he is advised to maintain close follow up with Fayrene Helper, MD for primary care needs.   - Time spent with the patient: 50 minutes, of which >50% was spent in  counseling him about his hypercalcemia and the rest in obtaining information about his symptoms, reviewing his previous labs/studies ( including abstractions from other facilities),  evaluations, and treatments,  and developing a plan to confirm diagnosis and long term treatment based on the latest standards of care/guidelines; and documenting his care.  Bryce Martinez participated in the discussions, expressed understanding, and voiced agreement with the above plans.  All questions were answered to his satisfaction. he is encouraged to contact clinic should he have any questions or concerns  prior to his return visit.  Follow up plan: Return in about 3  months (around 11/06/2021) for F/U with Pre-visit Labs.   Glade Lloyd, MD [facility] [address] [place], [place] [address] Phone: [phone]  Fax: [phone]     08/06/2021, 3:44 PM  This note was partially dictated with voice recognition software. Similar sounding words can be transcribed inadequately or may not  be corrected upon review.

## 2021-08-20 ENCOUNTER — Ambulatory Visit (INDEPENDENT_AMBULATORY_CARE_PROVIDER_SITE_OTHER): Payer: Medicare Other

## 2021-08-20 ENCOUNTER — Other Ambulatory Visit: Payer: Self-pay

## 2021-08-20 VITALS — Ht 73.0 in | Wt 174.0 lb

## 2021-08-20 DIAGNOSIS — Z0000 Encounter for general adult medical examination without abnormal findings: Secondary | ICD-10-CM

## 2021-08-20 NOTE — Progress Notes (Signed)
Subjective:   Bryce Martinez is a 70 y.o. male who presents for Medicare Annual/Subsequent preventive examination. Virtual Visit via Telephone Note  I connected with  Bryce Martinez on 08/20/21 at 11:00 AM EDT by telephone and verified that I am speaking with the correct person using two identifiers.  Location: Patient: HOME Provider: [facility] Persons participating in the virtual visit: patient/Nurse Health Advisor   I discussed the limitations, risks, security and privacy concerns of performing an evaluation and management service by telephone and the availability of in person appointments. The patient expressed understanding and agreed to proceed.  Interactive audio and video telecommunications were attempted between this nurse and patient, however failed, due to patient having technical difficulties OR patient did not have access to video capability.  We continued and completed visit with audio only.  Some vital signs may be absent or patient reported.   Chriss Driver, LPN  Review of Systems     Cardiac Risk Factors include: advanced age (>65men, >68 women);hypertension;sedentary lifestyle;male gender;dyslipidemia     Objective:    Today's Vitals   08/20/21 1054 08/20/21 1055  Weight: 174 lb (78.9 kg)   Height: 6\' 1"  (1.854 m)   PainSc:  0-No pain   Body mass index is 22.96 kg/m.  Advanced Directives 08/20/2021 08/17/2020 09/01/2019 08/03/2018 07/10/2015 04/19/2014  Does Patient Have a Medical Advance Directive? No No No Yes No Patient does not have advance directive;Patient would not like information  Does patient want to make changes to medical advance directive? - - - Yes (ED - Information included in AVS) - -  Would patient like information on creating a medical advance directive? No - Patient declined Yes (Inpatient - patient defers creating a medical advance directive and declines information at this time) No - Patient declined - - -  Pre-existing out of  facility DNR order (yellow form or pink MOST form) - - - - - No    Current Medications (verified) Outpatient Encounter Medications as of 08/20/2021  Medication Sig   amLODipine (NORVASC) 5 MG tablet Take 1 tablet (5 mg total) by mouth daily.   fluticasone (FLONASE) 50 MCG/ACT nasal spray SPRAY 2 SPRAYS INTO EACH NOSTRIL EVERY DAY   losartan (COZAAR) 50 MG tablet Take 1 tablet (50 mg total) by mouth daily.   Multiple Vitamin (MULTIVITAMIN) tablet Take 1 tablet by mouth daily.   mupirocin cream (BACTROBAN) 2 % Apply 1 application topically 2 (two) times daily.   tadalafil (CIALIS) 20 MG tablet Take one tablet by mouth every 3 days, as needed, for erectile dysfunction   triamcinolone cream (KENALOG) 0.1 % Apply 1 application topically 2 (two) times daily.   No facility-administered encounter medications on file as of 08/20/2021.    Allergies (verified) Patient has no known allergies.   History: Past Medical History:  Diagnosis Date   Allergy    Arthritis    ED (erectile dysfunction)    Hepatitis C    Hyperlipidemia    Hypertension    Past Surgical History:  Procedure Laterality Date   APPENDECTOMY     CHOLECYSTECTOMY N/A 04/25/2014   Procedure: LAPAROSCOPIC CHOLECYSTECTOMY;  Surgeon: Jamesetta So, MD;  Location: [facility];  Service: General;  Laterality: N/A;   COLONOSCOPY N/A 09/01/2019   Procedure: COLONOSCOPY;  Surgeon: Rogene Houston, MD;  [facility];  Service: Endoscopy;  Laterality: N/A;  930   HEMICOLECTOMY Right    lap hand assisted   POLYPECTOMY  09/01/2019  Procedure: POLYPECTOMY;  Surgeon: Rogene Houston, MD;  [facility];  Service: Endoscopy;;  colon   right shoulder surgery Right 03/2005   rotator cuff repair   Family History  Problem Relation Age of Onset   Hypertension Mother    Cancer Father        colon    Hypertension Sister    Hypertension Brother    Diabetes Brother    Hypertension Daughter    Hypertension Son     Social History   Socioeconomic History   Marital status: Married    Spouse name: Not on file   Number of children: Not on file   Years of education: Not on file   Highest education level: Not on file  Occupational History   Not on file  Tobacco Use   Smoking status: Former    Packs/day: 0.25    Years: 15.00    Pack years: 3.75    Types: Cigarettes    Quit date: 04/19/1972    Years since quitting: 49.3   Smokeless tobacco: Never  Vaping Use   Vaping Use: Never used  Substance and Sexual Activity   Alcohol use: Yes    Alcohol/week: 0.0 standard drinks    Comment: special occasions   Drug use: No   Sexual activity: Yes    Birth control/protection: None  Other Topics Concern   Not on file  Social History Narrative   Not on file   Social Determinants of Health   Financial Resource Strain: Low Risk    Difficulty of Paying Living Expenses: Not hard at all  Food Insecurity: No Food Insecurity   Worried About Charity fundraiser in the Last Year: Never true   [place] in the Last Year: Never true  Transportation Needs: No Transportation Needs   Lack of Transportation (Medical): No   Lack of Transportation (Non-Medical): No  Physical Activity: Sufficiently Active   Days of Exercise per Week: 5 days   Minutes of Exercise per Session: 30 min  Stress: No Stress Concern Present   Feeling of Stress : Not at all  Social Connections: Socially Integrated   Frequency of Communication with Friends and Family: More than three times a week   Frequency of Social Gatherings with Friends and Family: More than three times a week   Attends Religious Services: More than 4 times per year   Active Member of [employer] or Organizations: Yes   Attends Music therapist: More than 4 times per year   Marital Status: Married    Tobacco Counseling Counseling given: Not Answered   Clinical Intake:  Pre-visit preparation completed: Yes  Pain : No/denies pain Pain Score:  0-No pain     BMI - recorded: 22.96 Nutritional Status: BMI of 19-24  Normal Nutritional Risks: None Diabetes: No  How often do you need to have someone help you when you read instructions, pamphlets, or other written materials from your doctor or pharmacy?: 1 - Never  Diabetic?NO  Interpreter Needed?: No  Information entered by :: MJ Jakeim Sedore, LPN   Activities of Daily Living In your present state of health, do you have any difficulty performing the following activities: 08/20/2021  Hearing? N  Vision? N  Difficulty concentrating or making decisions? N  Walking or climbing stairs? N  Dressing or bathing? N  Doing errands, shopping? N  Preparing Food and eating ? N  Using the Toilet? N  In the past six months, have you  accidently leaked urine? N  Do you have problems with loss of bowel control? N  Managing your Medications? N  Managing your Finances? N  Housekeeping or managing your Housekeeping? N  Some recent data might be hidden    Patient Care Team: Fayrene Helper, MD as PCP - General  Indicate any recent Medical Services you may have received from other than [facility] providers in the past year (date may be approximate).     Assessment:   This is a routine wellness examination for [place].  Hearing/Vision screen Hearing Screening - Comments:: No hearing issues  Vision Screening - Comments:: Glasses. [facility]. 06/2021.  Dietary issues and exercise activities discussed: Current Exercise Habits: The patient has a physically strenuous job, but has no regular exercise apart from work., Type of exercise: walking, Time (Minutes): 60, Frequency (Times/Week): 5, Weekly Exercise (Minutes/Week): 300, Intensity: Mild, Exercise limited by: cardiac condition(s)   Goals Addressed             This Visit's Progress    Exercise 3x per week (30 min per time)       Exercise routine       Depression Screen PHQ 2/9 Scores 08/20/2021 11/16/2020 08/17/2020 05/04/2020  08/05/2019 11/16/2018 08/03/2018  PHQ - 2 Score 0 0 0 0 0 0 0  PHQ- 9 Score - - - - - - -    Fall Risk Fall Risk  08/20/2021 06/28/2021 11/16/2020 08/17/2020 05/04/2020  Falls in the past year? 0 0 0 0 0  Number falls in past yr: 0 0 0 0 -  Injury with Fall? 0 0 0 0 -  Risk for fall due to : Impaired vision - - - -  Follow up Falls prevention discussed - - Falls evaluation completed -    FALL RISK PREVENTION PERTAINING TO THE HOME:  Any stairs in or around the home? Yes  If so, are there any without handrails? No  Home free of loose throw rugs in walkways, pet beds, electrical cords, etc? Yes  Adequate lighting in your home to reduce risk of falls? Yes   ASSISTIVE DEVICES UTILIZED TO PREVENT FALLS:  Life alert? No  Use of a cane, walker or w/c? No  Grab bars in the bathroom? No  Shower chair or bench in shower? No  Elevated toilet seat or a handicapped toilet? Yes   TIMED UP AND GO:  Was the test performed? No . Phone visit   Cognitive Function:     6CIT Screen 08/20/2021 08/17/2020 08/05/2019 08/03/2018  What Year? 0 points 0 points 0 points -  What month? 0 points 0 points 0 points -  What time? 0 points 0 points 0 points 0 points  Count back from 20 0 points 0 points 0 points 0 points  Months in reverse 0 points 0 points 0 points 0 points  Repeat phrase 0 points 0 points 0 points 0 points  Total Score 0 0 0 -    Immunizations Immunization History  Administered Date(s) Administered   Fluad Quad(high Dose 65+) 07/19/2019, 08/02/2020, 06/28/2021   Influenza,inj,Quad PF,6+ Mos 08/16/2014, 08/24/2015, 07/24/2016, 08/03/2018   PFIZER(Purple Top)SARS-COV-2 Vaccination 12/04/2019, 12/25/2019, 08/23/2020   Pneumococcal Conjugate-13 09/22/2017   Pneumococcal Polysaccharide-23 11/16/2018   Td 06/14/2004   Tdap 10/10/2014   Zoster Recombinat (Shingrix) 01/03/2021   Zoster, Live 07/22/2012    TDAP status: Up to date  Flu Vaccine status: Up to date  Pneumococcal vaccine  status: Up to date  Covid-19 vaccine status: Information  provided on how to obtain vaccines.   Qualifies for Shingles Vaccine? Yes   Zostavax completed Yes   Shingrix Completed?: No.    Education has been provided regarding the importance of this vaccine. Patient has been advised to call insurance company to determine out of pocket expense if they have not yet received this vaccine. Advised may also receive vaccine at local pharmacy or Health Dept. Verbalized acceptance and understanding.  Screening Tests Health Maintenance  Topic Date Due   COVID-19 Vaccine (4 - Booster for Pfizer series) 12/24/2020   Zoster Vaccines- Shingrix (2 of 2) 02/28/2021   COLONOSCOPY (Pts 45-44yrs Insurance coverage will need to be confirmed)  08/31/2024   TETANUS/TDAP  10/10/2024   INFLUENZA VACCINE  Completed   Hepatitis C Screening  Completed   HPV VACCINES  Aged Out    Health Maintenance  Health Maintenance Due  Topic Date Due   COVID-19 Vaccine (4 - Booster for [place] series) 12/24/2020   Zoster Vaccines- Shingrix (2 of 2) 02/28/2021    Colorectal cancer screening: Type of screening: Colonoscopy. Completed 09/01/2019. Repeat every 5 years  Lung Cancer Screening: (Low Dose CT Chest recommended if Age 53-80 years, 30 pack-year currently smoking OR have quit w/in 15years.) does not qualify.     Additional Screening:  Hepatitis C Screening: does qualify; Completed 11/01/2019  Vision Screening: Recommended annual ophthalmology exams for early detection of glaucoma and other disorders of the eye. Is the patient up to date with their annual eye exam?  Yes  Who is the provider or what is the name of the office in which the patient attends annual eye exams? [facility] If pt is not established with a provider, would they like to be referred to a provider to establish care? No .   Dental Screening: Recommended annual dental exams for proper oral hygiene  Community Resource Referral / Chronic  Care Management: CRR required this visit?  No   CCM required this visit?  No      Plan:     I have personally reviewed and noted the following in the patient's chart:   Medical and social history Use of alcohol, tobacco or illicit drugs  Current medications and supplements including opioid prescriptions. Patient is not currently taking opioid prescriptions. Functional ability and status Nutritional status Physical activity Advanced directives List of other physicians Hospitalizations, surgeries, and ER visits in previous 12 months Vitals Screenings to include cognitive, depression, and falls Referrals and appointments  In addition, I have reviewed and discussed with patient certain preventive protocols, quality metrics, and best practice recommendations. A written personalized care plan for preventive services as well as general preventive health recommendations were provided to patient.     Chriss Driver, LPN   [id number]/38/[id number]   Nurse Notes: Pt states he is doing well. Up to date on Health Maintenance. Aware the he needs 2nd Shingrix. Colonoscopy due 2025, pt aware. 6CIT score of 0.

## 2021-08-20 NOTE — Patient Instructions (Signed)
[name] , Thank you for taking time to come for your Medicare Wellness Visit. I appreciate your ongoing commitment to your health goals. Please review the following plan we discussed and let me know if I can assist you in the future.   Screening recommendations/referrals: Colonoscopy: Done 09/01/2019 Repeat in 5 years  Recommended yearly ophthalmology/optometry visit for glaucoma screening and checkup Recommended yearly dental visit for hygiene and checkup  Vaccinations: Influenza vaccine: Done 06/28/2021 Repeat annually  Pneumococcal vaccine: Done 09/22/2017 and 11/16/2018 Tdap vaccine: Done 10/10/2014 Repeat in 10 years  Shingles vaccine: Done 07/22/2012 and 01/03/2021   Covid-19: Done 12/04/2019, 12/25/2019 and 08/23/2020  Advanced directives: Please bring a copy of your health care power of attorney and living will to the office to be added to your chart at your convenience.   Conditions/risks identified: Aim for 30 minutes of exercise or walking each day, drink 6-8 glasses of water and eat lots of fruits and vegetables.   Next appointment: Follow up in one year for your annual wellness visit. 2023.  Preventive Care 69 Years and Older, Male  Preventive care refers to lifestyle choices and visits with your health care provider that can promote health and wellness. What does preventive care include? A yearly physical exam. This is also called an annual well check. Dental exams once or twice a year. Routine eye exams. Ask your health care provider how often you should have your eyes checked. Personal lifestyle choices, including: Daily care of your teeth and gums. Regular physical activity. Eating a healthy diet. Avoiding tobacco and drug use. Limiting alcohol use. Practicing safe sex. Taking low doses of aspirin every day. Taking vitamin and mineral supplements as recommended by your health care provider. What happens during an annual well check? The services and screenings  done by your health care provider during your annual well check will depend on your age, overall health, lifestyle risk factors, and family history of disease. Counseling  Your health care provider may ask you questions about your: Alcohol use. Tobacco use. Drug use. Emotional well-being. Home and relationship well-being. Sexual activity. Eating habits. History of falls. Memory and ability to understand (cognition). Work and work Statistician. Screening  You may have the following tests or measurements: Height, weight, and BMI. Blood pressure. Lipid and cholesterol levels. These may be checked every 5 years, or more frequently if you are over 71 years old. Skin check. Lung cancer screening. You may have this screening every year starting at age 46 if you have a 30-pack-year history of smoking and currently smoke or have quit within the past 15 years. Fecal occult blood test (FOBT) of the stool. You may have this test every year starting at age 78. Flexible sigmoidoscopy or colonoscopy. You may have a sigmoidoscopy every 5 years or a colonoscopy every 10 years starting at age 64. Prostate cancer screening. Recommendations will vary depending on your family history and other risks. Hepatitis C blood test. Hepatitis B blood test. Sexually transmitted disease (STD) testing. Diabetes screening. This is done by checking your blood sugar (glucose) after you have not eaten for a while (fasting). You may have this done every 1-3 years. Abdominal aortic aneurysm (AAA) screening. You may need this if you are a current or former smoker. Osteoporosis. You may be screened starting at age 70 if you are at high risk. Talk with your health care provider about your test results, treatment options, and if necessary, the need for more tests. Vaccines  Your health care  provider may recommend certain vaccines, such as: Influenza vaccine. This is recommended every year. Tetanus, diphtheria, and acellular  pertussis (Tdap, Td) vaccine. You may need a Td booster every 10 years. Zoster vaccine. You may need this after age 31. Pneumococcal 13-valent conjugate (PCV13) vaccine. One dose is recommended after age 49. Pneumococcal polysaccharide (PPSV23) vaccine. One dose is recommended after age 2. Talk to your health care provider about which screenings and vaccines you need and how often you need them. This information is not intended to replace advice given to you by your health care provider. Make sure you discuss any questions you have with your health care provider. Document Released: 11/17/2015 Document Revised: 07/10/2016 Document Reviewed: 08/22/2015 Elsevier Interactive Patient Education  2017 [place] Prevention in the Home Falls can cause injuries. They can happen to people of all ages. There are many things you can do to make your home safe and to help prevent falls. What can I do on the outside of my home? Regularly fix the edges of walkways and driveways and fix any cracks. Remove anything that might make you trip as you walk through a door, such as a raised step or threshold. Trim any bushes or trees on the path to your home. Use bright outdoor lighting. Clear any walking paths of anything that might make someone trip, such as rocks or tools. Regularly check to see if handrails are loose or broken. Make sure that both sides of any steps have handrails. Any raised decks and porches should have guardrails on the edges. Have any leaves, snow, or ice cleared regularly. Use sand or salt on walking paths during winter. Clean up any spills in your garage right away. This includes oil or grease spills. What can I do in the bathroom? Use night lights. Install grab bars by the toilet and in the tub and shower. Do not use towel bars as grab bars. Use non-skid mats or decals in the tub or shower. If you need to sit down in the shower, use a plastic, non-slip stool. Keep the floor  dry. Clean up any water that spills on the floor as soon as it happens. Remove soap buildup in the tub or shower regularly. Attach bath mats securely with double-sided non-slip rug tape. Do not have throw rugs and other things on the floor that can make you trip. What can I do in the bedroom? Use night lights. Make sure that you have a light by your bed that is easy to reach. Do not use any sheets or blankets that are too big for your bed. They should not hang down onto the floor. Have a firm chair that has side arms. You can use this for support while you get dressed. Do not have throw rugs and other things on the floor that can make you trip. What can I do in the kitchen? Clean up any spills right away. Avoid walking on wet floors. Keep items that you use a lot in easy-to-reach places. If you need to reach something above you, use a strong step stool that has a grab bar. Keep electrical cords out of the way. Do not use floor polish or wax that makes floors slippery. If you must use wax, use non-skid floor wax. Do not have throw rugs and other things on the floor that can make you trip. What can I do with my stairs? Do not leave any items on the stairs. Make sure that there are handrails on both  sides of the stairs and use them. Fix handrails that are broken or loose. Make sure that handrails are as long as the stairways. Check any carpeting to make sure that it is firmly attached to the stairs. Fix any carpet that is loose or worn. Avoid having throw rugs at the top or bottom of the stairs. If you do have throw rugs, attach them to the floor with carpet tape. Make sure that you have a light switch at the top of the stairs and the bottom of the stairs. If you do not have them, ask someone to add them for you. What else can I do to help prevent falls? Wear shoes that: Do not have high heels. Have rubber bottoms. Are comfortable and fit you well. Are closed at the toe. Do not wear  sandals. If you use a stepladder: Make sure that it is fully opened. Do not climb a closed stepladder. Make sure that both sides of the stepladder are locked into place. Ask someone to hold it for you, if possible. Clearly mark and make sure that you can see: Any grab bars or handrails. First and last steps. Where the edge of each step is. Use tools that help you move around (mobility aids) if they are needed. These include: Canes. Walkers. Scooters. Crutches. Turn on the lights when you go into a dark area. Replace any light bulbs as soon as they burn out. Set up your furniture so you have a clear path. Avoid moving your furniture around. If any of your floors are uneven, fix them. If there are any pets around you, be aware of where they are. Review your medicines with your doctor. Some medicines can make you feel dizzy. This can increase your chance of falling. Ask your doctor what other things that you can do to help prevent falls. This information is not intended to replace advice given to you by your health care provider. Make sure you discuss any questions you have with your health care provider. Document Released: 08/17/2009 Document Revised: 03/28/2016 Document Reviewed: 11/25/2014 Elsevier Interactive Patient Education  2017 [name].

## 2021-08-29 ENCOUNTER — Encounter: Payer: Medicare Other | Admitting: Family Medicine

## 2021-09-18 ENCOUNTER — Encounter: Payer: Self-pay | Admitting: Family Medicine

## 2021-09-19 ENCOUNTER — Ambulatory Visit: Payer: Medicare Other | Admitting: Family Medicine

## 2021-09-19 ENCOUNTER — Encounter: Payer: Self-pay | Admitting: Family Medicine

## 2021-09-19 ENCOUNTER — Other Ambulatory Visit: Payer: Self-pay

## 2021-09-19 VITALS — BP 200/102 | HR 66 | Resp 16 | Ht 73.0 in | Wt 173.0 lb

## 2021-09-19 DIAGNOSIS — I10 Essential (primary) hypertension: Secondary | ICD-10-CM | POA: Diagnosis not present

## 2021-09-19 DIAGNOSIS — M67449 Ganglion, unspecified hand: Secondary | ICD-10-CM | POA: Diagnosis not present

## 2021-09-19 MED ORDER — SPIRONOLACTONE 25 MG PO TABS: 25.0000 mg | ORAL_TABLET | Freq: Every day | ORAL | 2 refills | Status: DC

## 2021-09-19 NOTE — Patient Instructions (Addendum)
    F//U  in 10  to 12 days re evaluate blood pressure, call if you need me sooner  NEW, take Cozaar 50 mg TWO every morning for blood pressure Take Amlodipine 5 mg one in the morning and one in the evening with the spironolactone   Cyst in right index web may need Ortho attention, call if increased in size or pain for referral  Non fasting chem 7 and EGFr 2 days before next visit please.  Thanks for choosing [facility], we consider it a privelige to serve you.

## 2021-09-21 ENCOUNTER — Encounter: Payer: Self-pay | Admitting: Family Medicine

## 2021-09-21 DIAGNOSIS — M67449 Ganglion, unspecified hand: Secondary | ICD-10-CM | POA: Insufficient documentation

## 2021-09-21 NOTE — Progress Notes (Signed)
   Bryce Martinez     MRN: [id number]      DOB: 21-Sep-1951   HPI Mr. Stan is here with c/o sore on right index x 3 weeks, not painful except with direct pressure Recently evaluated by Endo for hypercalcemia, med changed and BP markedly elevated   ROS Denies recent fever or chills. Denies sinus pressure, nasal congestion, ear pain or sore throat. Denies chest congestion, productive cough or wheezing. Denies chest pains, palpitations and leg swelling Denies abdominal pain, nausea, vomiting,diarrhea or constipation.   Denies dysuria, frequency, hesitancy or incontinence. Denies joint pain, swelling and limitation in mobility. Denies headaches, seizures, numbness, or tingling. Denies depression, anxiety or insomnia.  PE  BP (!) 200/102   Pulse 66   Resp 16   Ht 6\' 1"  (1.854 m)   Wt 173 lb (78.5 kg)   SpO2 98%   BMI 22.82 kg/m   Patient alert and oriented and in no cardiopulmonary distress.  HEENT: No facial asymmetry, EOMI,     Neck supple .  Chest: Clear to auscultation bilaterally.  CVS: S1, S2 no murmurs, no S3.Regular rate.  ABD: Soft non tender.   Ext: No edema  MS: Adequate ROM spine, shoulders, hips and knees. Cyst in web of right index finger Skin: Intact, no ulcerations or rash noted.  Psych: Good eye contact, normal affect. Memory intact not anxious or depressed appearing.  CNS: CN 2-12 intact, power,  normal throughout.no focal deficits noted.   Assessment & Plan  Essential hypertension Uncontrolled with recent med change, increase cozaar to 100 mg daily, amlodipine to 5 mg twice daily and add spironolactone 25 mg daily DASH diet and commitment to daily physical activity for a minimum of 30 minutes discussed and encouraged, as a part of hypertension management. The importance of attaining a healthy weight is also discussed.  BP/Weight 09/19/2021 08/20/2021 08/06/2021 07/08/2021 06/28/2021 11/16/2020 33/35/4562  Systolic BP 563 - 893 734 150 287 681   Diastolic BP 157 - 72 87 82 60 72  Wt. (Lbs) 173 174 174.4 - 167 175 173  BMI 22.82 22.96 23.01 - 22.03 23.09 22.82     F/u in 10 days  Hypercalcemia Being eval by Endo  Ganglion cyst of finger Right index x 3 weeks, observation only at this time

## 2021-09-21 NOTE — Assessment & Plan Note (Signed)
Right index x 3 weeks, observation only at this time

## 2021-09-21 NOTE — Assessment & Plan Note (Signed)
Uncontrolled with recent med change, increase cozaar to 100 mg daily, amlodipine to 5 mg twice daily and add spironolactone 25 mg daily DASH diet and commitment to daily physical activity for a minimum of 30 minutes discussed and encouraged, as a part of hypertension management. The importance of attaining a healthy weight is also discussed.  BP/Weight 09/19/2021 08/20/2021 08/06/2021 07/08/2021 06/28/2021 11/16/2020 64/38/3779  Systolic BP 396 - 886 484 720 721 828  Diastolic BP 833 - 72 87 82 60 72  Wt. (Lbs) 173 174 174.4 - 167 175 173  BMI 22.82 22.96 23.01 - 22.03 23.09 22.82     F/u in 10 days

## 2021-09-21 NOTE — Assessment & Plan Note (Signed)
Being eval by Endo

## 2021-09-29 ENCOUNTER — Other Ambulatory Visit: Payer: Self-pay | Admitting: Family Medicine

## 2021-10-02 ENCOUNTER — Other Ambulatory Visit: Payer: Self-pay

## 2021-10-02 ENCOUNTER — Encounter: Payer: Self-pay | Admitting: Family Medicine

## 2021-10-02 ENCOUNTER — Ambulatory Visit (INDEPENDENT_AMBULATORY_CARE_PROVIDER_SITE_OTHER): Payer: Medicare Other | Admitting: Family Medicine

## 2021-10-02 VITALS — BP 130/76 | HR 74 | Ht 73.0 in | Wt 173.0 lb

## 2021-10-02 DIAGNOSIS — E785 Hyperlipidemia, unspecified: Secondary | ICD-10-CM

## 2021-10-02 DIAGNOSIS — I10 Essential (primary) hypertension: Secondary | ICD-10-CM

## 2021-10-02 DIAGNOSIS — M542 Cervicalgia: Secondary | ICD-10-CM | POA: Diagnosis not present

## 2021-10-02 LAB — BMP8+EGFR
BUN/Creatinine Ratio: 11 (ref 10–24)
BUN: 14 mg/dL (ref 8–27)
CO2: 23 mmol/L (ref 20–29)
Calcium: 9.5 mg/dL (ref 8.6–10.2)
Chloride: 102 mmol/L (ref 96–106)
Creatinine, Ser: 1.23 mg/dL (ref 0.76–1.27)
Glucose: 81 mg/dL (ref 70–99)
Potassium: 4.4 mmol/L (ref 3.5–5.2)
Sodium: 140 mmol/L (ref 134–144)
eGFR: 64 mL/min/{1.73_m2} (ref >59)

## 2021-10-02 MED ORDER — SPIRONOLACTONE 25 MG PO TABS: 25.0000 mg | ORAL_TABLET | Freq: Every day | ORAL | 1 refills | Status: DC

## 2021-10-02 MED ORDER — LOSARTAN POTASSIUM 100 MG PO TABS: 100.0000 mg | ORAL_TABLET | Freq: Every day | ORAL | 1 refills | Status: DC

## 2021-10-02 MED ORDER — AMLODIPINE BESYLATE 10 MG PO TABS: ORAL_TABLET | ORAL | 1 refills | Status: DC

## 2021-10-02 NOTE — Assessment & Plan Note (Signed)
Hyperlipidemia:Low fat diet discussed and encouraged.   Lipid Panel  Lab Results  Component Value Date   CHOL 215 (H) 06/28/2021   HDL 68 06/28/2021   LDLCALC 131 (H) 06/28/2021   TRIG 90 06/28/2021   CHOLHDL 3.2 06/28/2021     Updated lab needed at/ before next visit.

## 2021-10-02 NOTE — Patient Instructions (Signed)
Annual exam in January when due,  Meds as listed, blood pressure and kidney function are excellent!  Thanks for choosing [facility], we consider it a privelige to serve you.

## 2021-10-02 NOTE — Assessment & Plan Note (Signed)
Established DJD C spine, advised topical rubs and tylenol,as needed

## 2021-10-02 NOTE — Assessment & Plan Note (Signed)
Controlled, no change in medication DASH diet and commitment to daily physical activity for a minimum of 30 minutes discussed and encouraged, as a part of hypertension management. The importance of attaining a healthy weight is also discussed.  BP/Weight 10/02/2021 09/19/2021 08/20/2021 08/06/2021 07/08/2021 06/28/2021 6/64/4034  Systolic BP 742 595 - 638 756 433 295  Diastolic BP 76 188 - 72 87 82 60  Wt. (Lbs) 173 173 174 174.4 - 167 175  BMI 22.82 22.82 22.96 23.01 - 22.03 23.09

## 2021-10-02 NOTE — Progress Notes (Signed)
   LAN MCNEILL     MRN: [id number]      DOB: 1951-09-07   HPIuncontrolled HTN and CKD Bryce. Martinez is here for follow up and re-evaluation of chronic medical conditions,  specifically medication management and review of any available recent lab and radiology data.  Preventive health is updated, specifically  Cancer screening and Immunization.   Questions or concerns regarding consultations or procedures which the PT has had in the interim are  addressed. The PT denies any adverse reactions to current medications since the last visit.  C/o popping In right side of neck   ROS Denies recent fever or chills. Denies sinus pressure, nasal congestion, ear pain or sore throat. Denies chest congestion, productive cough or wheezing. Denies chest pains, palpitations and leg swelling Denies abdominal pain, nausea, vomiting,diarrhea or constipation.   Denies dysuria, frequency, hesitancy or incontinence. . Denies headaches, seizures, numbness, or tingling. Denies depression, anxiety or insomnia. Denies skin break down or rash.   PE  BP 130/76   Pulse 74   Ht 6\' 1"  (1.854 m)   Wt 173 lb (78.5 kg)   SpO2 98%   BMI 22.82 kg/m   Patient alert and oriented and in no cardiopulmonary distress.  HEENT: No facial asymmetry, EOMI,     Neck decreased ROM.  Chest: Clear to auscultation bilaterally.  CVS: S1, S2 no murmurs, no S3.Regular rate.  ABD: Soft non tender.   Ext: No edema  MS: Adequate ROM spine, shoulders, hips and knees.  Skin: Intact, no ulcerations or rash noted.  Psych: Good eye contact, normal affect. Memory intact not anxious or depressed appearing.  CNS: CN 2-12 intact, power,  normal throughout.no focal deficits noted.   Assessment & Plan  Essential hypertension Controlled, no change in medication DASH diet and commitment to daily physical activity for a minimum of 30 minutes discussed and encouraged, as a part of hypertension management. The importance of  attaining a healthy weight is also discussed.  BP/Weight 10/02/2021 09/19/2021 08/20/2021 08/06/2021 07/08/2021 06/28/2021 7/85/8850  Systolic BP 277 412 - 878 676 720 947  Diastolic BP 76 096 - 72 87 82 60  Wt. (Lbs) 173 173 174 174.4 - 167 175  BMI 22.82 22.82 22.96 23.01 - 22.03 23.09       Dyslipidemia Hyperlipidemia:Low fat diet discussed and encouraged.   Lipid Panel  Lab Results  Component Value Date   CHOL 215 (H) 06/28/2021   HDL 68 06/28/2021   LDLCALC 131 (H) 06/28/2021   TRIG 90 06/28/2021   CHOLHDL 3.2 06/28/2021     Updated lab needed at/ before next visit.   Neck pain Established DJD C spine, advised topical rubs and tylenol,as needed

## 2021-11-06 ENCOUNTER — Encounter (INDEPENDENT_AMBULATORY_CARE_PROVIDER_SITE_OTHER): Payer: Self-pay | Admitting: Internal Medicine

## 2021-11-06 ENCOUNTER — Other Ambulatory Visit: Payer: Self-pay

## 2021-11-06 ENCOUNTER — Ambulatory Visit (INDEPENDENT_AMBULATORY_CARE_PROVIDER_SITE_OTHER): Payer: Medicare Other | Admitting: Internal Medicine

## 2021-11-06 DIAGNOSIS — K7400 Hepatic fibrosis, unspecified: Secondary | ICD-10-CM

## 2021-11-06 DIAGNOSIS — Z86010 Personal history of colonic polyps: Secondary | ICD-10-CM | POA: Diagnosis not present

## 2021-11-06 NOTE — Progress Notes (Signed)
Presenting complaint;  History of hepatitis C successfully treated. Hepatic fibrosis.  Database and subjective:  Patient is 71 year old Afro-American male who has a history of hepatitis C which was successfully treated about 6 years ago.  He had F2/F3 disease centimeter commended periodic screening for [place].  He also has a history of colonic adenomas and last colonoscopy was in October 2020. Last year he lost some weight and Dr. Moshe Cipro did right upper quadrant abdominal ultrasound which revealed slightly echogenic liver but no focal abnormalities were noted.  Patient has no complaints.  He weighed 173 pounds at the time of office visit in December 2020 and today he weighs 171 pounds.  He says his appetite is back to normal.  Heartburn is experienced occasionally with certain foods.  He denies nausea vomiting or abdominal pain.  Bowels move daily.  He denies melena or rectal bleeding.  He tells me he was recently found to have hypercalcemia and he was seen by Dr. Dorris Fetch.  Hypercalcemia was felt to be due to diuretic which was discontinued.  Subsequently his antihypertensive medications had to be adjusted and finally his blood pressure is well controlled.  He is not doing regular physical activity has recommended by Dr. Moshe Cipro. He does not smoke cigarettes or drink alcohol.   Current Medications: Outpatient Encounter Medications as of 11/06/2021  Medication Sig   amLODipine (NORVASC) 10 MG tablet Take one tablet every evening at 8  pm for blood pressure   fluticasone (FLONASE) 50 MCG/ACT nasal spray SPRAY 2 SPRAYS INTO EACH NOSTRIL EVERY DAY   losartan (COZAAR) 100 MG tablet Take 1 tablet (100 mg total) by mouth daily.   Multiple Vitamin (MULTIVITAMIN) tablet Take 1 tablet by mouth daily.   spironolactone (ALDACTONE) 25 MG tablet Take 1 tablet (25 mg total) by mouth daily.   tadalafil (CIALIS) 20 MG tablet Take one tablet by mouth every 3 days, as needed, for erectile dysfunction   No  facility-administered encounter medications on file as of 11/06/2021.    Objective: Blood pressure 130/74, pulse 67, temperature 97.8 F (36.6 C), temperature source Oral, height 6' 1" (1.854 m), weight 171 lb 12.8 oz (77.9 kg). Patient is alert and in no acute distress Conjunctiva is pink. Sclera is nonicteric Oropharyngeal mucosa is normal. No neck masses or thyromegaly noted. Cardiac exam with regular rhythm normal S1 and S2. No murmur or gallop noted. Lungs are clear to auscultation. Abdomen is symmetrical soft and nontender with organomegaly or masses. No LE edema or clubbing noted.  Labs/studies Results:   CBC Latest Ref Rng & Units 06/28/2021 05/04/2020 09/10/2019  WBC 3.4 - 10.8 x10E3/uL 4.8 3.8 4.0  Hemoglobin 13.0 - 17.7 g/dL 13.4 13.3 13.9  Hematocrit 37.5 - 51.0 % 38.3 40.4 42.3  Platelets 150 - 450 x10E3/uL 230 222 221    CMP Latest Ref Rng & Units 10/01/2021 06/28/2021 05/04/2020  Glucose 70 - 99 mg/dL 81 81 92  BUN 8 - 27 mg/dL _0 Creatinine 0.76 - 1.27 mg/dL 1.23 1.37(H) 1.37(H)  Sodium 134 - 144 mmol/L 140 140 140  Potassium 3.5 - 5.2 mmol/L 4.4 4.3 3.9  Chloride 96 - 106 mmol/L 102 99 101  CO2 20 - 29 mmol/L _1 Calcium 8.6 - 10.2 mg/dL 9.5 10.9(H) 9.2  Total Protein 6.0 - 8.5 g/dL - 7.5 7.1  Total Bilirubin 0.0 - 1.2 mg/dL - 1.3(H) 1.1  Alkaline Phos 44 - 121 IU/L - 52 53  AST 0 - 40 IU/L -  30 28  ALT 0 - 44 IU/L - 27 28    Hepatic Function Latest Ref Rng & Units 06/28/2021 05/04/2020 10/27/2019  Total Protein 6.0 - 8.5 g/dL 7.5 7.1 CANCELED  Albumin 3.8 - 4.8 g/dL 4.8 4.3 -  AST 0 - 40 IU/L 30 28 -  ALT 0 - 44 IU/L 27 28 -  Alk Phosphatase 44 - 121 IU/L 52 53 -  Total Bilirubin 0.0 - 1.2 mg/dL 1.3(H) 1.1 -  Bilirubin, Direct 0.00 - 0.40 mg/dL - - -     Assessment:  #1.  Hepatic fibrosis.  He had stage F2/F3 disease noted on elastography secondary to hepatitis C which has been successfully treated.  Since he was noted to have F3 disease will  continue his screening for [facility] unless fibrosis has resolved.  He had ultrasound on 07/10/2021.  Next one will be due in March 2022.  #2.  History of hepatitis C stage F2/F3 disease genotype 2 treated in 2016 with sofosbuvir and ribavirin with SVR.  His transaminases have remained normal.  HCVRNA by PCR was undetectable in December 2020.  No indication to repeat HCV RNA by PCR.  #3.  Patient is high risk for colorectal cancer.  He had 2 small tubular adenomas removed on colonoscopy of 08/24/2019.  Father was diagnosed with colon cancer at late onset i.e. age 2.   Plan:  Serum alpha-fetoprotein. Abdominal ultrasound with elastography in March 2023. Office visit in 1 year. Next surveillance colonoscopy would be in October 2025.

## 2021-11-06 NOTE — Patient Instructions (Addendum)
Physician will call with results when [place] completed Abdominal ultrasound with elastography to be scheduled in March this year.

## 2021-11-09 LAB — AFP TUMOR MARKER: AFP-Tumor Marker: 4.4 ng/mL (ref <6.1)

## 2021-11-15 ENCOUNTER — Ambulatory Visit: Payer: Medicare Other | Admitting: "Endocrinology

## 2021-11-16 LAB — PTH, INTACT AND CALCIUM
Calcium: 9.7 mg/dL (ref 8.6–10.2)
PTH: 25 pg/mL (ref 15–65)

## 2021-11-16 LAB — PHOSPHORUS: Phosphorus: 3.4 mg/dL (ref 2.8–4.1)

## 2021-11-16 LAB — MAGNESIUM: Magnesium: 1.8 mg/dL (ref 1.6–2.3)

## 2021-11-22 ENCOUNTER — Ambulatory Visit: Payer: Medicare Other | Admitting: "Endocrinology

## 2021-11-22 ENCOUNTER — Encounter: Payer: Self-pay | Admitting: "Endocrinology

## 2021-11-22 ENCOUNTER — Other Ambulatory Visit: Payer: Self-pay

## 2021-11-22 NOTE — Progress Notes (Signed)
11/22/2021, 5:40 PM  Endocrinology follow-up note   Subjective:    Patient ID: Bryce Martinez, male    DOB: [birth year], PCP Fayrene Helper, MD   Past Medical History:  Diagnosis Date   Allergy    Arthritis    ED (erectile dysfunction)    Hepatitis C    Hyperlipidemia    Hypertension    Past Surgical History:  Procedure Laterality Date   APPENDECTOMY     CHOLECYSTECTOMY N/A 04/25/2014   Procedure: LAPAROSCOPIC CHOLECYSTECTOMY;  Surgeon: Jamesetta So, MD;  Location: [facility];  Service: General;  Laterality: N/A;   COLONOSCOPY N/A 09/01/2019   Procedure: COLONOSCOPY;  Surgeon: Rogene Houston, MD;  Location: [facility];  Service: Endoscopy;  Laterality: N/A;  930   HEMICOLECTOMY Right    lap hand assisted   POLYPECTOMY  09/01/2019   Procedure: POLYPECTOMY;  Surgeon: Rogene Houston, MD;  Location: [facility];  Service: Endoscopy;;  colon   right shoulder surgery Right 03/2005   rotator cuff repair   Social History   Socioeconomic History   Marital status: Married    Spouse name: Not on file   Number of children: Not on file   Years of education: Not on file   Highest education level: Not on file  Occupational History   Not on file  Tobacco Use   Smoking status: Former    Packs/day: 0.25    Years: 15.00    Pack years: 3.75    Types: Cigarettes    Quit date: 04/19/1972    Years since quitting: 49.6   Smokeless tobacco: Never  Vaping Use   Vaping Use: Never used  Substance and Sexual Activity   Alcohol use: Not Currently    Comment: special occasions   Drug use: No   Sexual activity: Yes    Birth control/protection: None  Other Topics Concern   Not on file  Social History Narrative   Not on file   Social Determinants of Health   Financial Resource Strain: Low Risk    Difficulty of Paying Living Expenses: Not hard at all  Food Insecurity: No Food Insecurity   Worried About Sales executive in the Last Year: Never true   [place] in the Last Year: Never true  Transportation Needs: No Transportation Needs   Lack of Transportation (Medical): No   Lack of Transportation (Non-Medical): No  Physical Activity: Sufficiently Active   Days of Exercise per Week: 5 days   Minutes of Exercise per Session: 30 min  Stress: No Stress Concern Present   Feeling of Stress : Not at all  Social Connections: Socially Integrated   Frequency of Communication with Friends and Family: More than three times a week   Frequency of Social Gatherings with Friends and Family: More than three times a week   Attends Religious Services: More than 4 times per year   Active Member of [employer] or Organizations: Yes   Attends Music therapist: More than 4 times per year   Marital Status: Married   Family History  Problem Relation Age of Onset   Hypertension Mother    Cancer Father  colon    Hypertension Sister    Hypertension Brother    Diabetes Brother    Hypertension Daughter    Hypertension Son    Outpatient Encounter Medications as of 11/22/2021  Medication Sig   amLODipine (NORVASC) 10 MG tablet Take one tablet every evening at 8  pm for blood pressure   fluticasone (FLONASE) 50 MCG/ACT nasal spray SPRAY 2 SPRAYS INTO EACH NOSTRIL EVERY DAY   losartan (COZAAR) 100 MG tablet Take 1 tablet (100 mg total) by mouth daily.   Multiple Vitamin (MULTIVITAMIN) tablet Take 1 tablet by mouth daily.   spironolactone (ALDACTONE) 25 MG tablet Take 1 tablet (25 mg total) by mouth daily.   tadalafil (CIALIS) 20 MG tablet Take one tablet by mouth every 3 days, as needed, for erectile dysfunction   No facility-administered encounter medications on file as of 11/22/2021.   ALLERGIES: No Known Allergies  VACCINATION STATUS: Immunization History  Administered Date(s) Administered   Fluad Quad(high Dose 65+) 07/19/2019, 08/02/2020, 06/28/2021   Influenza,inj,Quad PF,6+ Mos 08/16/2014,  08/24/2015, 07/24/2016, 08/03/2018   PFIZER(Purple Top)SARS-COV-2 Vaccination 11/24/2019, 12/25/2019, 08/23/2020, 09/11/2021   Pneumococcal Conjugate-13 09/22/2017   Pneumococcal Polysaccharide-23 11/16/2018   Td 06/14/2004   Tdap 10/10/2014   Zoster Recombinat (Shingrix) 01/03/2021   Zoster, Live 07/22/2012    HPI Bryce Martinez is 71 y.o. male who presents today to follow-up after he was seen in consultation for hypercalcemia.  His consult was requested by his PMD: Fayrene Helper, MD.  -See notes from his prior visit.  He denies any prior history of parathyroid/thyroid dysfunction.  Admittedly, he has been taking multivitamins for several years until he was told he has high calcium and when he stopped.  He denies any history of nephrolithiasis, seizure disorder, nor osteoporosis.  He was found to have high calcium of 10.9 at one of his several lab draws. His repeat previsit labs show PTH was 25, calcium normal at 9.7, magnesium 3.4, normal magnesium 1.8.  -He has been on Maxide which has 50 mg of hydrochlorothiazide which he took for several years for blood pressure management. He denies any family history of parathyroid dysfunction.  Patient is not a smoker.  Is also on amlodipine for blood pressure management.  He has no new complaints today.  Review of Systems  Constitutional: no recent weight gain/loss, no fatigue, no subjective hyperthermia, no subjective hypothermia   Objective:    Vitals with BMI 11/22/2021 11/06/2021 10/02/2021  Height 6\' 1"  6\' 1"  -  Weight 173 lbs 13 oz 171 lbs 13 oz -  BMI 95.62 13.08 -  Systolic 657 846 962  Diastolic 84 74 76  Pulse 60 67 -    BP 136/84    Pulse 60    Ht 6\' 1"  (1.854 m)    Wt 173 lb 12.8 oz (78.8 kg)    BMI 22.93 kg/m   Wt Readings from Last 3 Encounters:  11/22/21 173 lb 12.8 oz (78.8 kg)  11/06/21 171 lb 12.8 oz (77.9 kg)  10/02/21 173 lb (78.5 kg)    Physical Exam  Constitutional:  Body mass index is 22.93 kg/m.,   not in acute distress, normal state of mind Eyes: PERRLA, EOMI, no exophthalmos ENT: moist mucous membranes, no gross thyromegaly, no gross cervical lymphadenopathy   CMP ( most recent) CMP     Component Value Date/Time   NA 140 10/01/2021 1316   K 4.4 10/01/2021 1316   CL 102 10/01/2021 1316   CO2 23 10/01/2021  1316   GLUCOSE 81 10/01/2021 1316   GLUCOSE 73 07/24/2016 1427   BUN 14 10/01/2021 1316   CREATININE 1.23 10/01/2021 1316   CREATININE 1.42 (H) 07/24/2016 1427   CALCIUM 9.7 11/15/2021 1402   PROT 7.5 06/28/2021 1418   ALBUMIN 4.8 06/28/2021 1418   AST 30 06/28/2021 1418   ALT 27 06/28/2021 1418   ALKPHOS 52 06/28/2021 1418   BILITOT 1.3 (H) 06/28/2021 1418   GFRNONAA 53 (L) 05/04/2020 1406   GFRNONAA 55 (L) 01/09/2012 0905   GFRAA 61 05/04/2020 1406   GFRAA 64 01/09/2012 0905     Diabetic Labs (most recent): Lab Results  Component Value Date   HGBA1C 5.5 09/16/2017   HGBA1C 5.8 (H) 05/20/2015   HGBA1C 5.7 (H) 10/22/2014     Lipid Panel ( most recent) Lipid Panel     Component Value Date/Time   CHOL 215 (H) 06/28/2021 1418   TRIG 90 06/28/2021 1418   HDL 68 06/28/2021 1418   CHOLHDL 3.2 06/28/2021 1418   CHOLHDL 3.0 05/20/2015 1010   VLDL 10 05/20/2015 1010   LDLCALC 131 (H) 06/28/2021 1418   LABVLDL 16 06/28/2021 1418      Lab Results  Component Value Date   TSH 1.970 06/28/2021   TSH 2.250 05/04/2020   TSH 1.690 01/10/2017   TSH 1.93 12/19/2015   TSH 2.292 10/22/2014   TSH 2.322 09/24/2013   TSH 4.528 (H) 01/09/2012   TSH 1.241 01/09/2011   TSH 1.241 01/09/2011   FREET4 1.22 01/09/2012      Assessment & Plan:   1. Hypercalcemia -His calcium level has normalized and labs are not showing parathyroid dysfunction.  His hypercalcemia was likely induced by HCTZ.  He will not need any specific anterior parathyroid intervention at this time.  He will need at least yearly evaluation with measurement of calcium. His blood pressure is  controlled currently using amlodipine 10 mg daily, losartan 100 mg p.o. daily, spironolactone 25 mg p.o. daily.   - I did not initiate any new prescriptions today. - he is advised to maintain close follow up with Fayrene Helper, MD for primary care needs.   I spent 21 minutes in the care of the patient today including review of labs from Thyroid Function, CMP, and other relevant labs ; imaging/biopsy records (current and previous including abstractions from other facilities); face-to-face time discussing  his lab results and symptoms, medications doses, his options of short and long term treatment based on the latest standards of care / guidelines;   and documenting the encounter.  [name]  participated in the discussions, expressed understanding, and voiced agreement with the above plans.  All questions were answered to his satisfaction. he is encouraged to contact clinic should he have any questions or concerns prior to his return visit.   Follow up plan: Return if symptoms worsen or fail to improve.   Glade Lloyd, MD [facility] [address] [place], [place] [address] Phone: [phone]  Fax: [phone]     11/22/2021, 5:40 PM  This note was partially dictated with voice recognition software. Similar sounding words can be transcribed inadequately or may not  be corrected upon review.

## 2021-11-23 ENCOUNTER — Encounter: Payer: Self-pay | Admitting: Family Medicine

## 2021-11-23 ENCOUNTER — Ambulatory Visit (INDEPENDENT_AMBULATORY_CARE_PROVIDER_SITE_OTHER): Payer: Medicare Other | Admitting: Family Medicine

## 2021-11-23 VITALS — BP 128/82 | HR 60 | Resp 16 | Ht 73.0 in | Wt 174.8 lb

## 2021-11-23 DIAGNOSIS — M48061 Spinal stenosis, lumbar region without neurogenic claudication: Secondary | ICD-10-CM

## 2021-11-23 DIAGNOSIS — I10 Essential (primary) hypertension: Secondary | ICD-10-CM

## 2021-11-23 DIAGNOSIS — E785 Hyperlipidemia, unspecified: Secondary | ICD-10-CM | POA: Diagnosis not present

## 2021-11-23 DIAGNOSIS — Z125 Encounter for screening for malignant neoplasm of prostate: Secondary | ICD-10-CM

## 2021-11-23 DIAGNOSIS — E559 Vitamin D deficiency, unspecified: Secondary | ICD-10-CM

## 2021-11-23 DIAGNOSIS — Z0000 Encounter for general adult medical examination without abnormal findings: Secondary | ICD-10-CM

## 2021-11-23 MED ORDER — PREDNISONE 10 MG PO TABS: 10.0000 mg | ORAL_TABLET | Freq: Two times a day (BID) | ORAL | 0 refills | Status: DC

## 2021-11-23 NOTE — Patient Instructions (Addendum)
F/U early September , call if you need me sooner  Please reduce fried and fatty foods  5 day course of prednisone is prescribed for back pain.  If this persists send a message I will refer you for epidural injection.  CBC, PSA, TSH , vit d Fasting lipid Cmp  and EGFR to be drawn 8/26 or after  Thanks for choosing [facility], we consider it a privelige to serve you.

## 2021-11-23 NOTE — Assessment & Plan Note (Signed)

## 2021-11-23 NOTE — Assessment & Plan Note (Signed)
Pain rated at a 7  Most of the time, will rx 5 day course of prednisone , refer for epidural which he has had in the past

## 2021-11-24 ENCOUNTER — Encounter: Payer: Self-pay | Admitting: Family Medicine

## 2021-11-24 NOTE — Progress Notes (Signed)
° °  Bryce Martinez     MRN: [id number]      DOB: [birth year]   HPI: Patient is in for annual physical exam. C/o chronic back pain rated at 7 to 8, considering epidural which he has had in the past Recent labs, if available are reviewed. Immunization is reviewed , and  updated if needed.    PE; BP 128/82    Pulse 60    Resp 16    Ht 6\' 1"  (1.854 m)    Wt 174 lb 12.8 oz (79.3 kg)    SpO2 98%    BMI 23.06 kg/m   Pleasant male, alert and oriented x 3, in no cardio-pulmonary distress. Afebrile. HEENT No facial trauma or asymetry. Sinuses non tender. EOMI External ears normal,  Neck: supple, no adenopathy,JVD or thyromegaly.No bruits.  Chest: Clear to ascultation bilaterally.No crackles or wheezes. Non tender to palpation  Cardiovascular system; Heart sounds normal,  S1 and  S2 ,no S3.  No murmur, or thrill. Apical beat not displaced Peripheral pulses normal.  Abdomen: Soft, non tender, no organomegaly or masses. No bruits. Bowel sounds normal. No guarding, tenderness or rebound.    Musculoskeletal exam: Decreased ROM of spine, adequate in hips ,and knees.Adequate in shoulders No deformity ,swelling or crepitus noted. No muscle wasting or atrophy.   Neurologic: Cranial nerves 2 to 12 intact. Power, tone ,sensation and reflexes normal throughout. No disturbance in gait. No tremor.  Skin: Intact, no ulceration, erythema , scaling or rash noted. Pigmentation normal throughout  Psych; Normal mood and affect. Judgement and concentration normal   Assessment & Plan:  Annual physical exam Annual exam as documented. Counseling done  re healthy lifestyle involving commitment to 150 minutes exercise per week, heart healthy diet, and attaining healthy weight.The importance of adequate sleep also discussed. Regular seat belt use and home safety, is also discussed. Changes in health habits are decided on by the patient with goals and time frames  set for achieving  them. Immunization and cancer screening needs are specifically addressed at this visit.   Spinal stenosis of lumbar region Pain rated at a 7  Most of the time, will rx 5 day course of prednisone , refer for epidural which he has had in the past

## 2021-12-15 ENCOUNTER — Other Ambulatory Visit: Payer: Self-pay | Admitting: Family Medicine

## 2021-12-26 ENCOUNTER — Encounter (INDEPENDENT_AMBULATORY_CARE_PROVIDER_SITE_OTHER): Payer: Self-pay | Admitting: *Deleted

## 2021-12-28 ENCOUNTER — Other Ambulatory Visit: Payer: Self-pay | Admitting: Family Medicine

## 2022-03-27 ENCOUNTER — Other Ambulatory Visit: Payer: Self-pay | Admitting: Family Medicine

## 2022-06-24 ENCOUNTER — Other Ambulatory Visit: Payer: Self-pay | Admitting: Family Medicine

## 2022-06-25 ENCOUNTER — Other Ambulatory Visit: Payer: Self-pay | Admitting: Family Medicine

## 2022-07-11 LAB — CMP14+EGFR
ALT: 17 IU/L (ref 0–44)
AST: 18 IU/L (ref 0–40)
Albumin/Globulin Ratio: 1.6 (ref 1.2–2.2)
Albumin: 4.4 g/dL (ref 3.9–4.9)
Alkaline Phosphatase: 68 IU/L (ref 44–121)
BUN/Creatinine Ratio: 10 (ref 10–24)
BUN: 13 mg/dL (ref 8–27)
Bilirubin Total: 1.6 mg/dL — ABNORMAL HIGH (ref 0.0–1.2)
CO2: 26 mmol/L (ref 20–29)
Calcium: 10.1 mg/dL (ref 8.6–10.2)
Chloride: 100 mmol/L (ref 96–106)
Creatinine, Ser: 1.32 mg/dL — ABNORMAL HIGH (ref 0.76–1.27)
Globulin, Total: 2.7 g/dL (ref 1.5–4.5)
Glucose: 90 mg/dL (ref 70–99)
Potassium: 4.3 mmol/L (ref 3.5–5.2)
Sodium: 137 mmol/L (ref 134–144)
Total Protein: 7.1 g/dL (ref 6.0–8.5)
eGFR: 58 mL/min/{1.73_m2} — ABNORMAL LOW (ref >59)

## 2022-07-11 LAB — LIPID PANEL
Chol/HDL Ratio: 3.2 ratio (ref 0.0–5.0)
Cholesterol, Total: 202 mg/dL — ABNORMAL HIGH (ref 100–199)
HDL: 64 mg/dL (ref >39)
LDL Chol Calc (NIH): 121 mg/dL — ABNORMAL HIGH (ref 0–99)
Triglycerides: 94 mg/dL (ref 0–149)
VLDL Cholesterol Cal: 17 mg/dL (ref 5–40)

## 2022-07-11 LAB — CBC
Hematocrit: 38.7 % (ref 37.5–51.0)
Hemoglobin: 13.2 g/dL (ref 13.0–17.7)
MCH: 27.6 pg (ref 26.6–33.0)
MCHC: 34.1 g/dL (ref 31.5–35.7)
MCV: 81 fL (ref 79–97)
Platelets: 228 10*3/uL (ref 150–450)
RBC: 4.79 x10E6/uL (ref 4.14–5.80)
RDW: 13.2 % (ref 11.6–15.4)
WBC: 4 10*3/uL (ref 3.4–10.8)

## 2022-07-11 LAB — PSA: Prostate Specific Ag, Serum: 0.9 ng/mL (ref 0.0–4.0)

## 2022-07-11 LAB — VITAMIN D 25 HYDROXY (VIT D DEFICIENCY, FRACTURES): Vit D, 25-Hydroxy: 21.9 ng/mL — ABNORMAL LOW (ref 30.0–100.0)

## 2022-07-11 LAB — TSH: TSH: 3.16 u[IU]/mL (ref 0.450–4.500)

## 2022-07-12 ENCOUNTER — Ambulatory Visit: Payer: Medicare Other | Admitting: Family Medicine

## 2022-07-12 ENCOUNTER — Encounter: Payer: Self-pay | Admitting: Family Medicine

## 2022-07-12 VITALS — BP 133/82 | HR 72 | Resp 18 | Ht 73.0 in | Wt 173.1 lb

## 2022-07-12 DIAGNOSIS — E785 Hyperlipidemia, unspecified: Secondary | ICD-10-CM

## 2022-07-12 DIAGNOSIS — I10 Essential (primary) hypertension: Secondary | ICD-10-CM | POA: Diagnosis not present

## 2022-07-12 DIAGNOSIS — N528 Other male erectile dysfunction: Secondary | ICD-10-CM

## 2022-07-12 DIAGNOSIS — Z23 Encounter for immunization: Secondary | ICD-10-CM | POA: Diagnosis not present

## 2022-07-12 DIAGNOSIS — J3089 Other allergic rhinitis: Secondary | ICD-10-CM

## 2022-07-12 DIAGNOSIS — B1920 Unspecified viral hepatitis C without hepatic coma: Secondary | ICD-10-CM | POA: Insufficient documentation

## 2022-07-12 MED ORDER — TADALAFIL 20 MG PO TABS: ORAL_TABLET | ORAL | 2 refills | Status: DC

## 2022-07-12 NOTE — Patient Instructions (Signed)
Annual exam in Feb, call if you need me sooner  Excellent BP   Please reduce fried and fatty foods  Please commit to daily vit D3, 1000 IU  Non fasting chem 7 and eGFR 3 days before next visit  Please get covid  booster  Thanks for choosing [facility], we consider it a privelige to serve you.      

## 2022-07-13 ENCOUNTER — Encounter: Payer: Self-pay | Admitting: Family Medicine

## 2022-07-13 MED ORDER — SPIRONOLACTONE 25 MG PO TABS: 25.0000 mg | ORAL_TABLET | Freq: Every day | ORAL | 3 refills | Status: DC

## 2022-07-13 NOTE — Assessment & Plan Note (Signed)
Controlled, no change in medication  

## 2022-07-13 NOTE — Assessment & Plan Note (Signed)
Controlled, no change in medication DASH diet and commitment to daily physical activity for a minimum of 30 minutes discussed and encouraged, as a part of hypertension management. The importance of attaining a healthy weight is also discussed.     07/12/2022   10:09 AM 11/23/2021    3:30 PM 11/23/2021    3:29 PM 11/23/2021    1:58 PM 11/22/2021   11:31 AM 11/06/2021    2:55 PM 10/02/2021   11:31 AM  BP/Weight  Systolic BP 904 753 391 792 178 375 423  Diastolic BP 82 82 82 82 84 74 76  Wt. (Lbs) 173.12   174.8 173.8 171.8   BMI 22.84 kg/m2   23.06 kg/m2 22.93 kg/m2 22.67 kg/m2

## 2022-07-13 NOTE — Progress Notes (Signed)
   Bryce Martinez     MRN: [id number]      DOB: April 23, 1951   HPI [name] is here for follow up and re-evaluation of chronic medical conditions, medication management and review of any available recent lab and radiology data.  Preventive health is updated, specifically  Cancer screening and Immunization.   Questions or concerns regarding consultations or procedures which the PT has had in the interim are  addressed. The PT denies any adverse reactions to current medications since the last visit.  There are no new concerns.  There are no specific complaints   ROS Denies recent fever or chills. Denies sinus pressure, nasal congestion, ear pain or sore throat. Denies chest congestion, productive cough or wheezing. Denies chest pains, palpitations and leg swelling Denies abdominal pain, nausea, vomiting,diarrhea or constipation.   Denies dysuria, frequency, hesitancy or incontinence. Denies joint pain, swelling and limitation in mobility. Denies headaches, seizures, numbness, or tingling. Denies depression, anxiety or insomnia. Denies skin break down or rash.   PE  BP 133/82 (BP Location: Right Arm, Patient Position: Sitting, Cuff Size: Normal)   Pulse 72   Resp 18   Ht '6\' 1"'$  (1.854 m)   Wt 173 lb 1.9 oz (78.5 kg)   SpO2 98%   BMI 22.84 kg/m   Patient alert and oriented and in no cardiopulmonary distress.  HEENT: No facial asymmetry, EOMI,     Neck supple .  Chest: Clear to auscultation bilaterally.  CVS: S1, S2 no murmurs, no S3.Regular rate.  ABD: Soft non tender.   Ext: No edema  MS: Adequate ROM spine, shoulders, hips and knees.  Skin: Intact, no ulcerations or rash noted.  Psych: Good eye contact, normal affect. Memory intact not anxious or depressed appearing.  CNS: CN 2-12 intact, power,  normal throughout.no focal deficits noted.   Assessment & Plan  Essential hypertension Controlled, no change in medication DASH diet and commitment to daily  physical activity for a minimum of 30 minutes discussed and encouraged, as a part of hypertension management. The importance of attaining a healthy weight is also discussed.     07/12/2022   10:09 AM 11/23/2021    3:30 PM 11/23/2021    3:29 PM 11/23/2021    1:58 PM 11/22/2021   11:31 AM 11/06/2021    2:55 PM 10/02/2021   11:31 AM  BP/Weight  Systolic BP 314 970 263 785 885 027 741  Diastolic BP 82 82 82 82 84 74 76  Wt. (Lbs) 173.12   174.8 173.8 171.8   BMI 22.84 kg/m2   23.06 kg/m2 22.93 kg/m2 22.67 kg/m2        Dyslipidemia Hyperlipidemia:Low fat diet discussed and encouraged.   Lipid Panel  Lab Results  Component Value Date   CHOL 202 (H) 07/10/2022   HDL 64 07/10/2022   LDLCALC 121 (H) 07/10/2022   TRIG 94 07/10/2022   CHOLHDL 3.2 07/10/2022     Needs to reduce fried and fatty  foods  Allergic rhinitis Controlled, no change in medication   ED (erectile dysfunction) Cialis as needed prescribed and printed script provided

## 2022-07-13 NOTE — Assessment & Plan Note (Signed)
Cialis as needed prescribed and printed script provided

## 2022-07-13 NOTE — Assessment & Plan Note (Signed)
Hyperlipidemia:Low fat diet discussed and encouraged.   Lipid Panel  Lab Results  Component Value Date   CHOL 202 (H) 07/10/2022   HDL 64 07/10/2022   LDLCALC 121 (H) 07/10/2022   TRIG 94 07/10/2022   CHOLHDL 3.2 07/10/2022     Needs to reduce fried and fatty  foods

## 2022-08-28 ENCOUNTER — Encounter: Payer: Medicare Other | Admitting: Internal Medicine

## 2022-09-26 ENCOUNTER — Other Ambulatory Visit: Payer: Self-pay | Admitting: Family Medicine

## 2022-10-09 ENCOUNTER — Encounter: Payer: Self-pay | Admitting: Family Medicine

## 2022-10-09 ENCOUNTER — Ambulatory Visit: Payer: Medicare Other | Admitting: Family Medicine

## 2022-10-09 ENCOUNTER — Ambulatory Visit (HOSPITAL_COMMUNITY)
Admission: RE | Admit: 2022-10-09 | Discharge: 2022-10-09 | Disposition: A | Discharge status: Home / Self Care | Payer: Medicare Other | Source: Ambulatory Visit | Attending: Family Medicine | Admitting: Family Medicine

## 2022-10-09 VITALS — BP 120/74 | HR 78 | Ht 73.0 in | Wt 175.0 lb

## 2022-10-09 DIAGNOSIS — I10 Essential (primary) hypertension: Secondary | ICD-10-CM | POA: Diagnosis not present

## 2022-10-09 DIAGNOSIS — J329 Chronic sinusitis, unspecified: Secondary | ICD-10-CM

## 2022-10-09 DIAGNOSIS — J411 Mucopurulent chronic bronchitis: Secondary | ICD-10-CM | POA: Insufficient documentation

## 2022-10-09 DIAGNOSIS — J42 Unspecified chronic bronchitis: Secondary | ICD-10-CM | POA: Insufficient documentation

## 2022-10-09 MED ORDER — BENZONATATE 100 MG PO CAPS: 100.0000 mg | ORAL_CAPSULE | Freq: Two times a day (BID) | ORAL | 0 refills | Status: DC | PRN

## 2022-10-09 MED ORDER — SULFAMETHOXAZOLE-TRIMETHOPRIM 800-160 MG PO TABS: 1.0000 | ORAL_TABLET | Freq: Two times a day (BID) | ORAL | 0 refills | Status: DC

## 2022-10-09 NOTE — Assessment & Plan Note (Signed)
Septra prescribed continue flonase daily

## 2022-10-09 NOTE — Assessment & Plan Note (Signed)
Cxr, septra, tessalon perle

## 2022-10-09 NOTE — Patient Instructions (Addendum)
F/u as before, call if you need me sooner  You are treated for sinusitis and bronchitis.  Please get chest x-ray at the hospital today.  2 medications are prescribed Septra, an antibiotic, 1 tablet twice daily for 10 days and a decongestant  Tessalon Perles 1 twice daily for 10 days.   Thanks for choosing [facility], we consider it a privelige to serve you.  Ensure you keep well hydrated and get sufficient rest  Thanks for choosing [facility], we consider it a privelige to serve you.

## 2022-10-13 ENCOUNTER — Encounter: Payer: Self-pay | Admitting: Family Medicine

## 2022-10-13 NOTE — Progress Notes (Signed)
   Bryce Martinez     MRN: [id number]      DOB: [birth year]   HPI Bryce Martinez is here with a 2 week h/o worsening head and chest congestion, drainage and sputum are thick and  yellow, cough is excessive. OTC meds not working Has had chills , no documented fever  ROS Denies chest pains, palpitations and leg swelling Denies abdominal pain, nausea, vomiting,diarrhea or constipation.   Denies dysuria, frequency, hesitancy or incontinence. Denies joint pain, swelling and limitation in mobility. Denies headaches, seizures, numbness, or tingling. Denies depression, anxiety or insomnia. Denies skin break down or rash.   PE  BP 120/74 (BP Location: Right Arm, Patient Position: Sitting, Cuff Size: Normal)   Pulse 78   Ht '6\' 1"'$  (1.854 m)   Wt 175 lb 0.6 oz (79.4 kg)   SpO2 97%   BMI 23.09 kg/m   Patient alert and oriented HEENT: No facial asymmetry, EOMI,     Neck supple .Frontal and maxillary sinus tenderness  Chest: decreased air entry, bilateral crackles and wheezes  CVS: S1, S2 no murmurs, no S3.Regular rate.  ABD: Soft non tender.   Ext: No edema  MS: Adequate ROM spine, shoulders, hips and knees.  Skin: Intact, no ulcerations or rash noted.  Psych: Good eye contact, normal affect. Memory intact not anxious or depressed appearing.  CNS: CN 2-12 intact, power,  normal throughout.no focal deficits noted.   Assessment & Plan  Chronic bronchitis ([place]) Cxr, septra, tessalon perle  Sinusitis Septra prescribed continue flonase daily  Essential hypertension Controlled, no change in medication

## 2022-10-13 NOTE — Assessment & Plan Note (Signed)
Controlled, no change in medication  

## 2022-10-24 ENCOUNTER — Encounter (INDEPENDENT_AMBULATORY_CARE_PROVIDER_SITE_OTHER): Payer: Self-pay | Admitting: Gastroenterology

## 2022-11-05 ENCOUNTER — Ambulatory Visit: Payer: Medicare Other | Admitting: Family Medicine

## 2022-11-07 ENCOUNTER — Ambulatory Visit (INDEPENDENT_AMBULATORY_CARE_PROVIDER_SITE_OTHER): Payer: Medicare Other | Admitting: Gastroenterology

## 2022-11-14 ENCOUNTER — Ambulatory Visit (INDEPENDENT_AMBULATORY_CARE_PROVIDER_SITE_OTHER): Payer: Medicare Other | Admitting: Gastroenterology

## 2022-11-18 ENCOUNTER — Encounter: Payer: Self-pay | Admitting: Family Medicine

## 2022-11-18 ENCOUNTER — Ambulatory Visit (INDEPENDENT_AMBULATORY_CARE_PROVIDER_SITE_OTHER): Payer: Medicare Other | Admitting: Family Medicine

## 2022-11-18 VITALS — BP 132/74 | HR 68 | Ht 73.0 in | Wt 174.0 lb

## 2022-11-18 DIAGNOSIS — Z0000 Encounter for general adult medical examination without abnormal findings: Secondary | ICD-10-CM | POA: Diagnosis not present

## 2022-11-18 NOTE — Progress Notes (Signed)
Annual Wellness Visit     Patient: Bryce Martinez, Male    DOB: 11-Jan-1951, 72 y.o.   MRN: [id number]  Subjective  Chief Complaint  Patient presents with   Annual Exam    Bryce Martinez is a 72 y.o. male who presents today for his Annual Wellness Visit. He reports consuming a general diet.  Patient Exercise daily   He generally feels well. He reports sleeping well. He does not have additional problems to discuss today.   HPI  Vision:Within last year   Patient Active Problem List   Diagnosis Date Noted   Chronic bronchitis ([place]) 10/09/2022   Sinusitis 10/09/2022   Hepatitis C 07/12/2022   Annual physical exam 11/23/2021   Hepatic fibrosis 11/06/2021   History of colonic polyps 11/06/2021   Ganglion cyst of finger 09/21/2021   Hypercalcemia 08/06/2021   Family history of colon cancer 06/28/2019   Hearing loss 06/03/2018   Spinal stenosis of lumbar region 09/03/2016   History of hepatitis C 07/26/2016   Right-sided low back pain with right-sided sciatica 07/26/2016   Neck pain 10/10/2014   Hepatitis C antibody test positive 10/10/2014   Allergic rhinitis 07/11/2011   ED (erectile dysfunction) 07/11/2011   Dyslipidemia 01/13/2008   Essential hypertension 01/13/2008      Medications: Outpatient Medications Prior to Visit  Medication Sig   amLODipine (NORVASC) 10 MG tablet TAKE ONE TABLET EVERY EVENING AT 8 PM FOR BLOOD PRESSURE   Ascorbic Acid (VITAMIN C) 1000 MG tablet Take 1,000 mg by mouth daily.   benzonatate (TESSALON) 100 MG capsule Take 1 capsule (100 mg total) by mouth 2 (two) times daily as needed for cough.   cholecalciferol (VITAMIN D3) 25 MCG (1000 UNIT) tablet Take 1,000 Units by mouth daily.   fluticasone (FLONASE) 50 MCG/ACT nasal spray SPRAY 2 SPRAYS INTO EACH NOSTRIL EVERY DAY   losartan (COZAAR) 100 MG tablet TAKE 1 TABLET BY MOUTH EVERY DAY   spironolactone (ALDACTONE) 25 MG tablet Take 1 tablet (25 mg total) by mouth daily.    sulfamethoxazole-trimethoprim (BACTRIM DS) 800-160 MG tablet Take 1 tablet by mouth 2 (two) times daily.   tadalafil (CIALIS) 20 MG tablet Take one tablet by mouth every 3 days as needed, for erectile dysfunction   No facility-administered medications prior to visit.    No Known Allergies  Patient Care Team: Fayrene Helper, MD as PCP - General  Review of Systems  Constitutional:  Negative for chills and fever.  HENT:  Negative for hearing loss.   Eyes:  Negative for blurred vision.  Respiratory:  Negative for shortness of breath.   Cardiovascular:  Negative for chest pain.  Gastrointestinal:  Negative for heartburn.  Genitourinary:  Negative for dysuria.  Musculoskeletal:  Negative for myalgias.  Neurological:  Negative for dizziness.  Psychiatric/Behavioral:  Negative for depression.         Objective  BP 132/74   Pulse 68   Ht '6\' 1"'$  (1.854 m)   Wt 174 lb (78.9 kg)   SpO2 98%   BMI 22.96 kg/m  BP Readings from Last 3 Encounters:  11/18/22 132/74  10/09/22 120/74  07/12/22 133/82      Physical Exam Cardiovascular:     Rate and Rhythm: Normal rate and regular rhythm.     Pulses: Normal pulses.  Pulmonary:     Effort: Pulmonary effort is normal. No respiratory distress.     Breath sounds: Normal breath sounds.  Abdominal:     General: Bowel  sounds are normal. There is no distension.     Palpations: Abdomen is soft.  Skin:    General: Skin is warm and dry.     Capillary Refill: Capillary refill takes less than 2 seconds.  Psychiatric:        Mood and Affect: Mood normal.       Most recent functional status assessment:    11/18/2022    2:02 PM  In your present state of health, do you have any difficulty performing the following activities:  Hearing? 0  Vision? 0  Difficulty concentrating or making decisions? 0  Walking or climbing stairs? 0  Dressing or bathing? 0  Doing errands, shopping? 0  Preparing Food and eating ? N  Using the Toilet? N   In the past six months, have you accidently leaked urine? N  Do you have problems with loss of bowel control? N  Managing your Medications? N  Managing your Finances? N  Housekeeping or managing your Housekeeping? N   Most recent fall risk assessment:    11/18/2022    2:01 PM  [place] in the past year? 0  Number falls in past yr: 0  Injury with Fall? 0  Risk for fall due to : No Fall Risks  Follow up Falls evaluation completed    Most recent depression screenings:    11/18/2022    1:59 PM 10/09/2022    3:01 PM  PHQ 2/9 Scores  PHQ - 2 Score 0 0  PHQ- 9 Score 0 0   Most recent cognitive screening:    08/20/2021   11:02 AM  6CIT Screen  What Year? 0 points  What month? 0 points  What time? 0 points  Count back from 20 0 points  Months in reverse 0 points  Repeat phrase 0 points  Total Score 0 points   Most recent Audit-C alcohol use screening    11/18/2022    1:59 PM  Alcohol Use Disorder Test (AUDIT)  1. How often do you have a drink containing alcohol? 0  2. How many drinks containing alcohol do you have on a typical day when you are drinking? 0  3. How often do you have six or more drinks on one occasion? 0  AUDIT-C Score 0   A score of 3 or more in women, and 4 or more in men indicates increased risk for alcohol abuse, EXCEPT if all of the points are from question 1   Vision/Hearing Screen: No results found.  Last CBC Lab Results  Component Value Date   WBC 4.0 07/10/2022   HGB 13.2 07/10/2022   HCT 38.7 07/10/2022   MCV 81 07/10/2022   MCH 27.6 07/10/2022   RDW 13.2 07/10/2022   PLT 228 78/29/5621   Last metabolic panel Lab Results  Component Value Date   GLUCOSE 90 07/10/2022   NA 137 07/10/2022   K 4.3 07/10/2022   CL 100 07/10/2022   CO2 26 07/10/2022   BUN 13 07/10/2022   CREATININE 1.32 (H) 07/10/2022   EGFR 58 (L) 07/10/2022   CALCIUM 10.1 07/10/2022   PHOS 3.4 11/15/2021   PROT 7.1 07/10/2022   ALBUMIN 4.4 07/10/2022    LABGLOB 2.7 07/10/2022   AGRATIO 1.6 07/10/2022   BILITOT 1.6 (H) 07/10/2022   ALKPHOS 68 07/10/2022   AST 18 07/10/2022   ALT 17 07/10/2022   Last lipids Lab Results  Component Value Date   CHOL 202 (H) 07/10/2022   HDL  64 07/10/2022   LDLCALC 121 (H) 07/10/2022   TRIG 94 07/10/2022   CHOLHDL 3.2 07/10/2022   Last hemoglobin A1c Lab Results  Component Value Date   HGBA1C 5.5 09/16/2017   Last thyroid functions Lab Results  Component Value Date   TSH 3.160 07/10/2022   Last vitamin D Lab Results  Component Value Date   VD25OH 21.9 (L) 07/10/2022   Last vitamin B12 and Folate No results found for: "VITAMINB12", "FOLATE"    No results found for any visits on 11/18/22.    Assessment & Plan   Annual wellness visit done today including the all of the following: Reviewed patient's Family Medical History Reviewed and updated list of patient's medical providers Assessment of cognitive impairment was done Assessed patient's functional ability Established a written schedule for health screening [place] Completed and Reviewed  Exercise Activities and Dietary recommendations  Goals      Exercise 3x per week (30 min per time)     Exercise routine       Immunization History  Administered Date(s) Administered   Covid-19, Mrna,Vaccine(Spikevax)103yr and older 07/28/2022   Fluad Quad(high Dose 65+) 07/19/2019, 08/02/2020, 06/28/2021, 07/12/2022   Influenza,inj,Quad PF,6+ Mos 08/16/2014, 08/24/2015, 07/24/2016, 08/03/2018   Influenza-Unspecified 08/04/2021, 07/24/2022   PFIZER Comirnaty(Gray Top)Covid-19 Tri-Sucrose Vaccine 03/02/2021   PFIZER(Purple Top)SARS-COV-2 Vaccination 11/24/2019, 12/25/2019, 07/31/2020, 08/23/2020, 09/11/2021   Pfizer Covid-19 Vaccine Bivalent Booster 164yr& up 09/11/2021   Pneumococcal Conjugate-13 09/22/2017   Pneumococcal Polysaccharide-23 11/16/2018   Td 06/14/2004   Tdap 10/10/2014   Zoster Recombinat  (Shingrix) 01/03/2021, 10/03/2021   Zoster, Live 07/22/2012    Health Maintenance  Topic Date Due   Medicare Annual Wellness (AWV)  11/19/2023   COLONOSCOPY (Pts 45-4969yrnsurance coverage will need to be confirmed)  08/31/2024   DTaP/Tdap/Td (3 - Td or Tdap) 10/10/2024   Pneumonia Vaccine 65+50ears old  Completed   INFLUENZA VACCINE  Completed   COVID-19 Vaccine  Completed   Hepatitis C Screening  Completed   Zoster Vaccines- Shingrix  Completed   HPV VACCINES  Aged Out     Discussed health benefits of physical activity, and encouraged him to engage in regular exercise appropriate for his age and condition.    Problem List Items Addressed This Visit   None Visit Diagnoses     Encounter for Medicare annual wellness exam    -  Primary       Return in 1 year (on 11/19/2023).     IliRenard HamperbRia CommentNP

## 2022-11-18 NOTE — Progress Notes (Deleted)
Annual Wellness Visit     Patient: Bryce Martinez, Male    DOB: Dec 19, 1950, 72 y.o.   MRN: [id number]  Subjective  Chief Complaint  Patient presents with   Annual Exam    Bryce Martinez is a 72 y.o. male who presents today for his Annual Wellness Visit. He reports consuming a general diet.  Patient Exercise daily   He generally feels well. He reports sleeping well. He does not have additional problems to discuss today.   HPI  Vision:Within last year   Patient Active Problem List   Diagnosis Date Noted   Chronic bronchitis ([place]) 10/09/2022   Sinusitis 10/09/2022   Hepatitis C 07/12/2022   Annual physical exam 11/23/2021   Hepatic fibrosis 11/06/2021   History of colonic polyps 11/06/2021   Ganglion cyst of finger 09/21/2021   Hypercalcemia 08/06/2021   Family history of colon cancer 06/28/2019   Hearing loss 06/03/2018   Spinal stenosis of lumbar region 09/03/2016   History of hepatitis C 07/26/2016   Right-sided low back pain with right-sided sciatica 07/26/2016   Neck pain 10/10/2014   Hepatitis C antibody test positive 10/10/2014   Allergic rhinitis 07/11/2011   ED (erectile dysfunction) 07/11/2011   Dyslipidemia 01/13/2008   Essential hypertension 01/13/2008      Medications: Outpatient Medications Prior to Visit  Medication Sig   amLODipine (NORVASC) 10 MG tablet TAKE ONE TABLET EVERY EVENING AT 8 PM FOR BLOOD PRESSURE   Ascorbic Acid (VITAMIN C) 1000 MG tablet Take 1,000 mg by mouth daily.   benzonatate (TESSALON) 100 MG capsule Take 1 capsule (100 mg total) by mouth 2 (two) times daily as needed for cough.   cholecalciferol (VITAMIN D3) 25 MCG (1000 UNIT) tablet Take 1,000 Units by mouth daily.   fluticasone (FLONASE) 50 MCG/ACT nasal spray SPRAY 2 SPRAYS INTO EACH NOSTRIL EVERY DAY   losartan (COZAAR) 100 MG tablet TAKE 1 TABLET BY MOUTH EVERY DAY   spironolactone (ALDACTONE) 25 MG tablet Take 1 tablet (25 mg total) by mouth daily.    sulfamethoxazole-trimethoprim (BACTRIM DS) 800-160 MG tablet Take 1 tablet by mouth 2 (two) times daily.   tadalafil (CIALIS) 20 MG tablet Take one tablet by mouth every 3 days as needed, for erectile dysfunction   No facility-administered medications prior to visit.    No Known Allergies  Patient Care Team: Fayrene Helper, MD as PCP - General  Review of Systems  Constitutional:  Negative for chills and fever.  HENT:  Negative for hearing loss.   Eyes:  Negative for blurred vision.  Respiratory:  Negative for shortness of breath.   Cardiovascular:  Negative for chest pain.  Gastrointestinal:  Negative for heartburn.  Genitourinary:  Negative for dysuria.  Musculoskeletal:  Negative for myalgias.  Neurological:  Negative for dizziness.  Psychiatric/Behavioral:  Negative for depression.         Objective  BP 132/74   Pulse 68   Ht '6\' 1"'$  (1.854 m)   Wt 174 lb (78.9 kg)   SpO2 98%   BMI 22.96 kg/m  BP Readings from Last 3 Encounters:  11/18/22 132/74  10/09/22 120/74  07/12/22 133/82      Physical Exam Cardiovascular:     Rate and Rhythm: Normal rate and regular rhythm.     Pulses: Normal pulses.  Pulmonary:     Effort: Pulmonary effort is normal. No respiratory distress.     Breath sounds: Normal breath sounds.  Abdominal:     General: Bowel  sounds are normal. There is no distension.     Palpations: Abdomen is soft.  Skin:    General: Skin is warm and dry.     Capillary Refill: Capillary refill takes less than 2 seconds.  Psychiatric:        Mood and Affect: Mood normal.       Most recent functional status assessment:    11/18/2022    2:02 PM  In your present state of health, do you have any difficulty performing the following activities:  Hearing? 0  Vision? 0  Difficulty concentrating or making decisions? 0  Walking or climbing stairs? 0  Dressing or bathing? 0  Doing errands, shopping? 0  Preparing Food and eating ? N  Using the Toilet? N   In the past six months, have you accidently leaked urine? N  Do you have problems with loss of bowel control? N  Managing your Medications? N  Managing your Finances? N  Housekeeping or managing your Housekeeping? N   Most recent fall risk assessment:    11/18/2022    2:01 PM  [place] in the past year? 0  Number falls in past yr: 0  Injury with Fall? 0  Risk for fall due to : No Fall Risks  Follow up Falls evaluation completed    Most recent depression screenings:    11/18/2022    1:59 PM 10/09/2022    3:01 PM  PHQ 2/9 Scores  PHQ - 2 Score 0 0  PHQ- 9 Score 0 0   Most recent cognitive screening:    08/20/2021   11:02 AM  6CIT Screen  What Year? 0 points  What month? 0 points  What time? 0 points  Count back from 20 0 points  Months in reverse 0 points  Repeat phrase 0 points  Total Score 0 points   Most recent Audit-C alcohol use screening    11/18/2022    1:59 PM  Alcohol Use Disorder Test (AUDIT)  1. How often do you have a drink containing alcohol? 0  2. How many drinks containing alcohol do you have on a typical day when you are drinking? 0  3. How often do you have six or more drinks on one occasion? 0  AUDIT-C Score 0   A score of 3 or more in women, and 4 or more in men indicates increased risk for alcohol abuse, EXCEPT if all of the points are from question 1   Vision/Hearing Screen: No results found.  Last CBC Lab Results  Component Value Date   WBC 4.0 07/10/2022   HGB 13.2 07/10/2022   HCT 38.7 07/10/2022   MCV 81 07/10/2022   MCH 27.6 07/10/2022   RDW 13.2 07/10/2022   PLT 228 81/19/1478   Last metabolic panel Lab Results  Component Value Date   GLUCOSE 90 07/10/2022   NA 137 07/10/2022   K 4.3 07/10/2022   CL 100 07/10/2022   CO2 26 07/10/2022   BUN 13 07/10/2022   CREATININE 1.32 (H) 07/10/2022   EGFR 58 (L) 07/10/2022   CALCIUM 10.1 07/10/2022   PHOS 3.4 11/15/2021   PROT 7.1 07/10/2022   ALBUMIN 4.4 07/10/2022    LABGLOB 2.7 07/10/2022   AGRATIO 1.6 07/10/2022   BILITOT 1.6 (H) 07/10/2022   ALKPHOS 68 07/10/2022   AST 18 07/10/2022   ALT 17 07/10/2022   Last lipids Lab Results  Component Value Date   CHOL 202 (H) 07/10/2022   HDL  64 07/10/2022   LDLCALC 121 (H) 07/10/2022   TRIG 94 07/10/2022   CHOLHDL 3.2 07/10/2022   Last hemoglobin A1c Lab Results  Component Value Date   HGBA1C 5.5 09/16/2017   Last thyroid functions Lab Results  Component Value Date   TSH 3.160 07/10/2022   Last vitamin D Lab Results  Component Value Date   VD25OH 21.9 (L) 07/10/2022   Last vitamin B12 and Folate No results found for: "VITAMINB12", "FOLATE"    No results found for any visits on 11/18/22.    Assessment & Plan   Annual wellness visit done today including the all of the following: Reviewed patient's Family Medical History Reviewed and updated list of patient's medical providers Assessment of cognitive impairment was done Assessed patient's functional ability Established a written schedule for health screening [place] Completed and Reviewed  Exercise Activities and Dietary recommendations  Goals      Exercise 3x per week (30 min per time)     Exercise routine        Immunization History  Administered Date(s) Administered   Covid-19, Mrna,Vaccine(Spikevax)29yr and older 07/28/2022   Fluad Quad(high Dose 65+) 07/19/2019, 08/02/2020, 06/28/2021, 07/12/2022   Influenza,inj,Quad PF,6+ Mos 08/16/2014, 08/24/2015, 07/24/2016, 08/03/2018   Influenza-Unspecified 08/04/2021, 07/24/2022   PFIZER Comirnaty(Gray Top)Covid-19 Tri-Sucrose Vaccine 03/02/2021   PFIZER(Purple Top)SARS-COV-2 Vaccination 11/24/2019, 12/25/2019, 07/31/2020, 08/23/2020, 09/11/2021   Pfizer Covid-19 Vaccine Bivalent Booster 149yr& up 09/11/2021   Pneumococcal Conjugate-13 09/22/2017   Pneumococcal Polysaccharide-23 11/16/2018   Td 06/14/2004   Tdap 10/10/2014   Zoster Recombinat  (Shingrix) 01/03/2021, 10/03/2021   Zoster, Live 07/22/2012    Health Maintenance  Topic Date Due   Medicare Annual Wellness (AWV)  11/19/2023   COLONOSCOPY (Pts 45-4958yrnsurance coverage will need to be confirmed)  08/31/2024   DTaP/Tdap/Td (3 - Td or Tdap) 10/10/2024   Pneumonia Vaccine 65+71ears old  Completed   INFLUENZA VACCINE  Completed   COVID-19 Vaccine  Completed   Hepatitis C Screening  Completed   Zoster Vaccines- Shingrix  Completed   HPV VACCINES  Aged Out     Discussed health benefits of physical activity, and encouraged him to engage in regular exercise appropriate for his age and condition.    Problem List Items Addressed This Visit   None Visit Diagnoses     Encounter for Medicare annual wellness exam    -  Primary       Return in 1 year (on 11/19/2023).     IliRenard HamperbRia CommentNP

## 2022-11-18 NOTE — Progress Notes (Deleted)
Annual Wellness Visit     Patient: Bryce Martinez, Male    DOB: Sep 29, 1951, 72 y.o.   MRN: [id number]  Subjective  Chief Complaint  Patient presents with   Annual Exam    Bryce Martinez is a 72 y.o. male who presents today for his Annual Wellness Visit. He reports consuming a {diet types:17450} diet. {Exercise:19826} He generally feels {well/fairly well/poorly:18703}. He reports sleeping {well/fairly well/poorly:18703}. He {does/does not:200015} have additional problems to discuss today.   HPI  {VISON DENTAL STD PSA (Optional):27386}   {History (Optional):23778}  Medications: Outpatient Medications Prior to Visit  Medication Sig   amLODipine (NORVASC) 10 MG tablet TAKE ONE TABLET EVERY EVENING AT 8 PM FOR BLOOD PRESSURE   Ascorbic Acid (VITAMIN C) 1000 MG tablet Take 1,000 mg by mouth daily.   benzonatate (TESSALON) 100 MG capsule Take 1 capsule (100 mg total) by mouth 2 (two) times daily as needed for cough.   cholecalciferol (VITAMIN D3) 25 MCG (1000 UNIT) tablet Take 1,000 Units by mouth daily.   fluticasone (FLONASE) 50 MCG/ACT nasal spray SPRAY 2 SPRAYS INTO EACH NOSTRIL EVERY DAY   losartan (COZAAR) 100 MG tablet TAKE 1 TABLET BY MOUTH EVERY DAY   spironolactone (ALDACTONE) 25 MG tablet Take 1 tablet (25 mg total) by mouth daily.   sulfamethoxazole-trimethoprim (BACTRIM DS) 800-160 MG tablet Take 1 tablet by mouth 2 (two) times daily.   tadalafil (CIALIS) 20 MG tablet Take one tablet by mouth every 3 days as needed, for erectile dysfunction   No facility-administered medications prior to visit.    No Known Allergies  Patient Care Team: Fayrene Helper, MD as PCP - General  ROS      Objective  BP 132/74   Pulse 68   Ht '6\' 1"'$  (1.854 m)   Wt 174 lb (78.9 kg)   SpO2 98%   BMI 22.96 kg/m  {Vitals History (Optional):23777}  Physical Exam    Most recent functional status assessment:    11/18/2022    2:02 PM  In your present state of health,  do you have any difficulty performing the following activities:  Hearing? 0  Vision? 0  Difficulty concentrating or making decisions? 0  Walking or climbing stairs? 0  Dressing or bathing? 0  Doing errands, shopping? 0  Preparing Food and eating ? N  Using the Toilet? N  In the past six months, have you accidently leaked urine? N  Do you have problems with loss of bowel control? N  Managing your Medications? N  Managing your Finances? N  Housekeeping or managing your Housekeeping? N   Most recent fall risk assessment:    11/18/2022    2:01 PM  Fall Risk   Falls in the past year? 0  Number falls in past yr: 0  Injury with Fall? 0  Risk for fall due to : No Fall Risks  Follow up Falls evaluation completed    Most recent depression screenings:    11/18/2022    1:59 PM 10/09/2022    3:01 PM  PHQ 2/9 Scores  PHQ - 2 Score 0 0  PHQ- 9 Score 0 0   Most recent cognitive screening:    08/20/2021   11:02 AM  6CIT Screen  What Year? 0 points  What month? 0 points  What time? 0 points  Count back from 20 0 points  Months in reverse 0 points  Repeat phrase 0 points  Total Score 0 points   Most recent  Audit-C alcohol use screening    11/18/2022    1:59 PM  Alcohol Use Disorder Test (AUDIT)  1. How often do you have a drink containing alcohol? 0  2. How many drinks containing alcohol do you have on a typical day when you are drinking? 0  3. How often do you have six or more drinks on one occasion? 0  AUDIT-C Score 0   A score of 3 or more in women, and 4 or more in men indicates increased risk for alcohol abuse, EXCEPT if all of the points are from question 1   Vision/Hearing Screen: No results found.  {Labs (Optional):23779}  No results found for any visits on 11/18/22.    Assessment & Plan   Annual wellness visit done today including the all of the following: Reviewed patient's Family Medical History Reviewed and updated list of patient's medical  providers Assessment of cognitive impairment was done Assessed patient's functional ability Established a written schedule for health screening [place] Completed and Reviewed  Exercise Activities and Dietary recommendations  Goals      Exercise 3x per week (30 min per time)     Exercise routine        Immunization History  Administered Date(s) Administered   Covid-19, Mrna,Vaccine(Spikevax)52yr and older 07/28/2022   Fluad Quad(high Dose 65+) 07/19/2019, 08/02/2020, 06/28/2021, 07/12/2022   Influenza,inj,Quad PF,6+ Mos 08/16/2014, 08/24/2015, 07/24/2016, 08/03/2018   Influenza-Unspecified 08/04/2021, 07/24/2022   PFIZER Comirnaty(Gray Top)Covid-19 Tri-Sucrose Vaccine 03/02/2021   PFIZER(Purple Top)SARS-COV-2 Vaccination 11/24/2019, 12/25/2019, 07/31/2020, 08/23/2020, 09/11/2021   Pfizer Covid-19 Vaccine Bivalent Booster 137yr& up 09/11/2021   Pneumococcal Conjugate-13 09/22/2017   Pneumococcal Polysaccharide-23 11/16/2018   Td 06/14/2004   Tdap 10/10/2014   Zoster Recombinat (Shingrix) 01/03/2021, 10/03/2021   Zoster, Live 07/22/2012    Health Maintenance  Topic Date Due   Medicare Annual Wellness (AWV)  11/19/2023   COLONOSCOPY (Pts 45-497yrnsurance coverage will need to be confirmed)  08/31/2024   DTaP/Tdap/Td (3 - Td or Tdap) 10/10/2024   Pneumonia Vaccine 65+16ears old  Completed   INFLUENZA VACCINE  Completed   COVID-19 Vaccine  Completed   Hepatitis C Screening  Completed   Zoster Vaccines- Shingrix  Completed   HPV VACCINES  Aged Out     Discussed health benefits of physical activity, and encouraged him to engage in regular exercise appropriate for his age and condition.    Problem List Items Addressed This Visit   None Visit Diagnoses     Encounter for Medicare annual wellness exam    -  Primary       Return in 1 year (on 11/19/2023).     IliRenard HamperbRia CommentNP

## 2022-11-19 ENCOUNTER — Encounter: Payer: Self-pay | Admitting: Family Medicine

## 2022-11-19 ENCOUNTER — Ambulatory Visit: Payer: Medicare Other | Admitting: Family Medicine

## 2022-11-19 VITALS — BP 124/72 | HR 72 | Ht 73.0 in | Wt 176.1 lb

## 2022-11-19 DIAGNOSIS — H6122 Impacted cerumen, left ear: Secondary | ICD-10-CM | POA: Diagnosis not present

## 2022-11-19 DIAGNOSIS — R040 Epistaxis: Secondary | ICD-10-CM

## 2022-11-19 DIAGNOSIS — J0100 Acute maxillary sinusitis, unspecified: Secondary | ICD-10-CM | POA: Diagnosis not present

## 2022-11-19 DIAGNOSIS — I10 Essential (primary) hypertension: Secondary | ICD-10-CM | POA: Diagnosis not present

## 2022-11-19 DIAGNOSIS — E785 Hyperlipidemia, unspecified: Secondary | ICD-10-CM

## 2022-11-19 MED ORDER — AZITHROMYCIN 250 MG PO TABS: ORAL_TABLET | ORAL | 0 refills | Status: AC

## 2022-11-19 NOTE — Patient Instructions (Signed)
Reschedule annual exam to 6 months pls, call if you need me sooner  Chem7 and eGFR today  Azithromycin is prescribed and  you are referred to ENT re right nose bleed, scant and left ear impacted with wax   Pls get RSV vaccine at your Pharmacy Labs will be ordered at next visit for a September draw  Reduce fried and fatty foods pls  Make time for regular exercise  Thanks for choosing [facility], we consider it a privelige to serve you.

## 2022-11-20 LAB — BMP8+EGFR
BUN/Creatinine Ratio: 9 — ABNORMAL LOW (ref 10–24)
BUN: 11 mg/dL (ref 8–27)
CO2: 26 mmol/L (ref 20–29)
Calcium: 9.8 mg/dL (ref 8.6–10.2)
Chloride: 100 mmol/L (ref 96–106)
Creatinine, Ser: 1.16 mg/dL (ref 0.76–1.27)
Glucose: 83 mg/dL (ref 70–99)
Potassium: 4.4 mmol/L (ref 3.5–5.2)
Sodium: 139 mmol/L (ref 134–144)
eGFR: 68 mL/min/{1.73_m2} (ref >59)

## 2022-11-21 ENCOUNTER — Ambulatory Visit: Payer: Medicare Other | Admitting: Family Medicine

## 2022-11-25 ENCOUNTER — Encounter: Payer: Self-pay | Admitting: Family Medicine

## 2022-11-25 DIAGNOSIS — H6122 Impacted cerumen, left ear: Secondary | ICD-10-CM | POA: Insufficient documentation

## 2022-11-25 DIAGNOSIS — R040 Epistaxis: Secondary | ICD-10-CM | POA: Insufficient documentation

## 2022-11-25 DIAGNOSIS — J0100 Acute maxillary sinusitis, unspecified: Secondary | ICD-10-CM | POA: Insufficient documentation

## 2022-11-25 NOTE — Progress Notes (Signed)
   VANE YAPP     MRN: [id number]      DOB: [birth year]   HPI Mr. Hunsinger is here with a  2 day h/o sinus pressure nasal discharge with blood from right nostril, also c/o ear fullness states he has hearing loss from [place] Not exercising regularly, working more often, but on his time   ROS Denies recent fever or chills. . Denies chest congestion, productive cough or wheezing. Denies chest pains, palpitations and leg swelling Denies abdominal pain, nausea, vomiting,diarrhea or constipation.   Denies dysuria, frequency, hesitancy or incontinence. Denies joint pain, swelling and limitation in mobility. Denies headaches, seizures, numbness, or tingling. Denies depression, anxiety or insomnia. Denies skin break down or rash.   PE  BP 124/72 (BP Location: Right Arm, Patient Position: Sitting, Cuff Size: Large)   Pulse 72   Ht '6\' 1"'$  (1.854 m)   Wt 176 lb 1.3 oz (79.9 kg)   SpO2 98%   BMI 23.23 kg/m   Patient alert and oriented and in no cardiopulmonary distress.  HEENT: No facial asymmetry, EOMI,     Neck supple .Right maxillary tenderness, partial impaction of right TM, no cervical adenopathy  Chest: Clear to auscultation bilaterally.  CVS: S1, S2 no murmurs, no S3.Regular rate.  ABD: Soft non tender.   Ext: No edema  MS: Adequate ROM spine, shoulders, hips and knees.  Skin: Intact, no ulcerations or rash noted.  Psych: Good eye contact, normal affect. Memory intact not anxious or depressed appearing.  CNS: CN 2-12 intact, power,  normal throughout.no focal deficits noted.   Assessment & Plan  Maxillary sinusitis, acute Z pack prescribed  Mild epistaxis Single episode right nostril, refer ENT  Impacted cerumen, left ear Refer ENT  Essential hypertension Controlled, no change in medication   Dyslipidemia Hyperlipidemia:Low fat diet discussed and encouraged.   Lipid Panel  Lab Results  Component Value Date   CHOL 202 (H) 07/10/2022   HDL 64  07/10/2022   LDLCALC 121 (H) 07/10/2022   TRIG 94 07/10/2022   CHOLHDL 3.2 07/10/2022     Needs to reduce fat Updated lab needed at/ before next visit.

## 2022-11-25 NOTE — Assessment & Plan Note (Signed)
Controlled, no change in medication  

## 2022-11-25 NOTE — Assessment & Plan Note (Signed)
Z pack prescribed 

## 2022-11-25 NOTE — Assessment & Plan Note (Signed)
Hyperlipidemia:Low fat diet discussed and encouraged.   Lipid Panel  Lab Results  Component Value Date   CHOL 202 (H) 07/10/2022   HDL 64 07/10/2022   LDLCALC 121 (H) 07/10/2022   TRIG 94 07/10/2022   CHOLHDL 3.2 07/10/2022     Needs to reduce fat Updated lab needed at/ before next visit.

## 2022-11-25 NOTE — Assessment & Plan Note (Signed)
Refer ENT

## 2022-11-25 NOTE — Assessment & Plan Note (Signed)
Single episode right nostril, refer ENT

## 2022-11-29 ENCOUNTER — Encounter: Payer: 59 | Admitting: Family Medicine

## 2022-12-27 ENCOUNTER — Other Ambulatory Visit: Payer: Self-pay | Admitting: Family Medicine

## 2023-01-06 ENCOUNTER — Telehealth (INDEPENDENT_AMBULATORY_CARE_PROVIDER_SITE_OTHER): Payer: Self-pay | Admitting: Gastroenterology

## 2023-01-06 ENCOUNTER — Encounter (INDEPENDENT_AMBULATORY_CARE_PROVIDER_SITE_OTHER): Payer: Self-pay | Admitting: Gastroenterology

## 2023-01-06 ENCOUNTER — Ambulatory Visit (INDEPENDENT_AMBULATORY_CARE_PROVIDER_SITE_OTHER): Payer: Medicare Other | Admitting: Gastroenterology

## 2023-01-06 VITALS — BP 104/69 | HR 75 | Temp 97.9°F | Ht 73.0 in | Wt 176.0 lb

## 2023-01-06 DIAGNOSIS — K7400 Hepatic fibrosis, unspecified: Secondary | ICD-10-CM | POA: Diagnosis not present

## 2023-01-06 NOTE — Progress Notes (Addendum)
Referring Provider: Fayrene Helper, MD Primary Care Physician:  Fayrene Helper, MD Primary GI Physician: Jenetta Downer   Chief Complaint  Patient presents with   hepatic fibrosis    Follow up on hepatic fibrosis. Patient reports doing well and no concerns today.    HPI:   Bryce Martinez is a 72 y.o. male with past medical history of arthritis, ED, Hep C, HLD, HTN.   Patient presenting today for hepatic fibrosis  history of hepatitis C which was successfully treated about 7 years ago. He had F2/F3 liver fibrosis   Last seen January 2023, at that time, doing well occasional heartburn with certain foods.  Recommended to have [place] in march 2023, AFP, colonoscopy October 2025.   AFP 4.4 in January 2023, last [place] was march 2022 with echogenic liver, no recent elastography CMP 07/2022 with normal LFTs other than T bili 1.6  Present:  He is doing well. No red flag symptoms. Patient denies melena, hematochezia, nausea, vomiting, diarrhea, constipation, dysphagia, odyonophagia, early satiety or weight loss. He does not drink or smoke. No episodes of swelling to abdomen, pruritus, jaundice or confusion. Patient has no concerns today.   Last Colonoscopy:08/2019 - Perianal skin tags found on perianal exam.                           - The examined portion of the ileum was normal.                           - Patent side-to-side ileo-colonic anastomosis.                           - One 5 mm polyp in the transverse colon, removed                            with a cold snare. Resected and retrieved.                           - One small polyp at the splenic flexure, removed                            with a cold snare. Complete resection. Polyp tissue                            not retrieved.  Recommendations:    Past Medical History:  Diagnosis Date   Allergy    Arthritis    ED (erectile dysfunction)    Hepatitis C    Hyperlipidemia    Hypertension     Past Surgical History:   Procedure Laterality Date   APPENDECTOMY     CHOLECYSTECTOMY N/A 04/25/2014   Procedure: LAPAROSCOPIC CHOLECYSTECTOMY;  Surgeon: Jamesetta So, MD;  Location: [facility];  Service: General;  Laterality: N/A;   COLONOSCOPY N/A 09/01/2019   Procedure: COLONOSCOPY;  Surgeon: Rogene Houston, MD;  Location: [facility];  Service: Endoscopy;  Laterality: N/A;  930   HEMICOLECTOMY Right    lap hand assisted   POLYPECTOMY  09/01/2019   Procedure: POLYPECTOMY;  Surgeon: Rogene Houston, MD;  Location: [facility];  Service: Endoscopy;;  colon   right shoulder surgery Right 03/2005  rotator cuff repair    Current Outpatient Medications  Medication Sig Dispense Refill   amLODipine (NORVASC) 10 MG tablet TAKE ONE TABLET EVERY EVENING AT 8 PM FOR BLOOD PRESSURE 90 tablet 1   cholecalciferol (VITAMIN D3) 25 MCG (1000 UNIT) tablet Take 1,000 Units by mouth daily.     fluticasone (FLONASE) 50 MCG/ACT nasal spray SPRAY 2 SPRAYS INTO EACH NOSTRIL EVERY DAY 48 mL 1   losartan (COZAAR) 100 MG tablet TAKE 1 TABLET BY MOUTH EVERY DAY 90 tablet 1   spironolactone (ALDACTONE) 25 MG tablet Take 1 tablet (25 mg total) by mouth daily. 90 tablet 3   tadalafil (CIALIS) 20 MG tablet Take one tablet by mouth every 3 days as needed, for erectile dysfunction 10 tablet 2   Ascorbic Acid (VITAMIN C) 1000 MG tablet Take 1,000 mg by mouth daily. (Patient not taking: Reported on 01/06/2023)     No current facility-administered medications for this visit.    Allergies as of 01/06/2023   (No Known Allergies)    Family History  Problem Relation Age of Onset   Hypertension Mother    Cancer Father        colon    Hypertension Sister    Hypertension Brother    Diabetes Brother    Hypertension Daughter    Hypertension Son     Social History   Socioeconomic History   Marital status: Married    Spouse name: Not on file   Number of children: Not on file   Years of education: Not on file   Highest education  level: Not on file  Occupational History   Not on file  Tobacco Use   Smoking status: Former    Packs/day: 0.25    Years: 15.00    Total pack years: 3.75    Types: Cigarettes    Quit date: 04/19/1972    Years since quitting: 50.7    Passive exposure: Past   Smokeless tobacco: Never  Vaping Use   Vaping Use: Never used  Substance and Sexual Activity   Alcohol use: Not Currently    Comment: special occasions   Drug use: No   Sexual activity: Yes    Birth control/protection: None  Other Topics Concern   Not on file  Social History Narrative   Not on file   Social Determinants of Health   Financial Resource Strain: Low Risk  (11/18/2022)   Overall Financial Resource Strain (CARDIA)    Difficulty of Paying Living Expenses: Not hard at all  Food Insecurity: No Food Insecurity (11/18/2022)   Hunger Vital Sign    Worried About Running Out of Food in the Last Year: Never true    [place] in the Last Year: Never true  Transportation Needs: No Transportation Needs (11/18/2022)   PRAPARE - Hydrologist (Medical): No    Lack of Transportation (Non-Medical): No  Physical Activity: Sufficiently Active (11/18/2022)   Exercise Vital Sign    Days of Exercise per Week: 7 days    Minutes of Exercise per Session: 30 min  Stress: No Stress Concern Present (11/18/2022)   [place]    Feeling of Stress : Not at all  Social Connections: [place] (11/18/2022)   Social Connection and Isolation Panel [NHANES]    Frequency of Communication with Friends and Family: Once a week    Frequency of Social Gatherings with Friends and Family: More than three  times a week    Attends Religious Services: More than 4 times per year    Active Member of Clubs or Organizations: Yes    Attends Music therapist: More than 4 times per year    Marital Status: Married    Review of  systems General: negative for malaise, night sweats, fever, chills, weight loss Neck: Negative for lumps, goiter, pain and significant neck swelling Resp: Negative for cough, wheezing, dyspnea at rest CV: Negative for chest pain, leg swelling, palpitations, orthopnea GI: denies melena, hematochezia, nausea, vomiting, diarrhea, constipation, dysphagia, odyonophagia, early satiety or unintentional weight loss.  MSK: Negative for joint pain or swelling, back pain, and muscle pain. Derm: Negative for itching or rash Psych: Denies depression, anxiety, memory loss, confusion. No homicidal or suicidal ideation.  Heme: Negative for prolonged bleeding, bruising easily, and swollen nodes. Endocrine: Negative for cold or heat intolerance, polyuria, polydipsia and goiter. Neuro: negative for tremor, gait imbalance, syncope and seizures. The remainder of the review of systems is noncontributory.  Physical Exam: BP 104/69 (BP Location: Right Arm, Patient Position: Sitting, Cuff Size: Large)   Pulse 75   Temp 97.9 F (36.6 C) (Oral)   Ht '6\' 1"'$  (1.854 m)   Wt 176 lb (79.8 kg)   BMI 23.22 kg/m  General:   Alert and oriented. No distress noted. Pleasant and cooperative.  Head:  Normocephalic and atraumatic. Eyes:  Conjuctiva clear without scleral icterus. Mouth:  Oral mucosa pink and moist. Good dentition. No lesions. Heart: Normal rate and rhythm, s1 and s2 heart sounds present.  Lungs: Clear lung sounds in all lobes. Respirations equal and unlabored. Abdomen:  +BS, soft, non-tender and non-distended. No rebound or guarding. No HSM or masses noted. Derm: No palmar erythema or jaundice Msk:  Symmetrical without gross deformities. Normal posture. Extremities:  Without edema. Neurologic:  Alert and  oriented x4 Psych:  Alert and cooperative. Normal mood and affect.  Invalid input(s): "6 MONTHS"   ASSESSMENT: Bryce Martinez is a 72 y.o. male presenting today for follow up of liver fibrosis  secondary to Hepatitis C.   Patient doing well today. History of Hep C, treated in 2016 with Sofosbuvir and Ribavirin with SVR, HCVRNA by PCR undetectable in Dec 2020. He has no complaints. Weight is stable. Last AFP in jan 2023 was WNL, no recent liver imaging since 2022 when he had regular US showing echogenic liver. He does not drink alcohol. He is due for AFP and US liver elastography, will get these scheduled.  Family history of CRC in his father and personal history of Tubular adenomas, due for Colonoscopy October 2025. He has no changes in bowel habits, rectal bleeding or melena.    PLAN:  AFP tumor marker  2. Schedule [place] elastography liver  3.colonoscopy October 2025  All questions were answered, patient verbalized understanding and is in agreement with plan as outlined above.    Follow Up: 1 year   Makinzi Prieur L. Alver Sorrow, MSN, APRN, AGNP-C Adult-Gerontology Nurse Practitioner [facility]  I have reviewed the note and agree with the APP's assessment as described in this progress note  Maylon Peppers, MD Gastroenterology and Hepatology [facility]

## 2023-01-06 NOTE — Telephone Encounter (Signed)
[place] scheduled for 02/02/23 at 7:30 arrive at 7:15A at [facility]. NPO after midnight. Spoke with wife Richrd Sox and verbalized understanding

## 2023-01-06 NOTE — Patient Instructions (Signed)
It was nice to meet you!! We will get you scheduled for US of the liver and repeat AFP tumor marker as part of your routine liver monitoring Please let me if you have any new GI concerns, will plan to repeat Colonoscopy in Oct 2025.  Follow up 1 year

## 2023-01-06 NOTE — Addendum Note (Signed)
Addended by: Harvel Quale on: 01/06/2023 07:43 PM   Modules accepted: Level of Service

## 2023-01-14 LAB — AFP TUMOR MARKER: AFP-Tumor Marker: 5.3 ng/mL (ref <6.1)

## 2023-01-15 ENCOUNTER — Ambulatory Visit (HOSPITAL_COMMUNITY): Payer: Medicare Other | Attending: Gastroenterology

## 2023-01-31 ENCOUNTER — Ambulatory Visit (HOSPITAL_COMMUNITY)
Admission: RE | Admit: 2023-01-31 | Discharge: 2023-01-31 | Disposition: A | Discharge status: Home / Self Care | Payer: Medicare Other | Source: Ambulatory Visit | Attending: Gastroenterology | Admitting: Gastroenterology

## 2023-01-31 DIAGNOSIS — K7400 Hepatic fibrosis, unspecified: Secondary | ICD-10-CM | POA: Insufficient documentation

## 2023-02-03 ENCOUNTER — Other Ambulatory Visit (INDEPENDENT_AMBULATORY_CARE_PROVIDER_SITE_OTHER): Payer: Self-pay | Admitting: Gastroenterology

## 2023-02-03 DIAGNOSIS — K7400 Hepatic fibrosis, unspecified: Secondary | ICD-10-CM

## 2023-02-03 DIAGNOSIS — Z8619 Personal history of other infectious and parasitic diseases: Secondary | ICD-10-CM

## 2023-02-13 LAB — LIVER FIBROSIS, FIBROTEST-ACTITEST
ALT: 13 U/L (ref 9–46)
Alpha-2-Macroglobulin: 266 mg/dL (ref 106–279)
Apolipoprotein A1: 153 mg/dL (ref 94–176)
Bilirubin: 1.2 mg/dL (ref 0.2–1.2)
Fibrosis Score: 0.63
GGT: 27 U/L (ref 3–70)
Haptoglobin: 104 mg/dL (ref 43–212)
Necroinflammat ACT Score: 0.06
Reference ID: 4862134

## 2023-02-19 NOTE — Progress Notes (Signed)
6 mth US noted in recall 

## 2023-03-25 ENCOUNTER — Other Ambulatory Visit: Payer: Self-pay | Admitting: Family Medicine

## 2023-05-20 ENCOUNTER — Other Ambulatory Visit: Payer: Self-pay

## 2023-05-20 ENCOUNTER — Ambulatory Visit (INDEPENDENT_AMBULATORY_CARE_PROVIDER_SITE_OTHER): Payer: Medicare Other | Admitting: Family Medicine

## 2023-05-20 ENCOUNTER — Encounter: Payer: Self-pay | Admitting: Family Medicine

## 2023-05-20 VITALS — BP 120/80 | HR 61 | Ht 73.0 in | Wt 171.1 lb

## 2023-05-20 DIAGNOSIS — E559 Vitamin D deficiency, unspecified: Secondary | ICD-10-CM

## 2023-05-20 DIAGNOSIS — R42 Dizziness and giddiness: Secondary | ICD-10-CM | POA: Diagnosis not present

## 2023-05-20 DIAGNOSIS — D126 Benign neoplasm of colon, unspecified: Secondary | ICD-10-CM

## 2023-05-20 DIAGNOSIS — E785 Hyperlipidemia, unspecified: Secondary | ICD-10-CM | POA: Diagnosis not present

## 2023-05-20 DIAGNOSIS — I10 Essential (primary) hypertension: Secondary | ICD-10-CM | POA: Diagnosis not present

## 2023-05-20 DIAGNOSIS — Z0000 Encounter for general adult medical examination without abnormal findings: Secondary | ICD-10-CM

## 2023-05-20 DIAGNOSIS — N528 Other male erectile dysfunction: Secondary | ICD-10-CM

## 2023-05-20 DIAGNOSIS — R0989 Other specified symptoms and signs involving the circulatory and respiratory systems: Secondary | ICD-10-CM | POA: Diagnosis not present

## 2023-05-20 NOTE — Progress Notes (Signed)
   Bryce Martinez     MRN: [id number]      DOB: 08-01-1951  Chief Complaint  Patient presents with   Annual Exam    CPE swelling in R/L feet L worse than right   C/o intermittent light headedness x 4 weeks C/o poor erectile function, no real response to medication , no urinary symptoms HPI: Patient is in for annual physical exam.  Immunization is reviewed , and  is up to date   PE; BP 120/80   Pulse 61   Ht 6\' 1"  (1.854 m)   Wt 171 lb 1.3 oz (77.6 kg)   SpO2 99%   BMI 22.57 kg/m   Pleasant male, alert and oriented x 3, in no cardio-pulmonary distress. Afebrile. HEENT No facial trauma or asymetry. Sinuses non tender. EOMI External ears normal,  Neck: supple, no adenopathy,JVD or thyromegaly. bruits.present  Chest: Clear to ascultation bilaterally.No crackles or wheezes. Non tender to palpation  Cardiovascular system; Heart sounds normal,  S1 and  S2 ,no S3.  No murmur, or thrill. Apical beat not displaced Peripheral pulses normal. EKG: sinus bradycardia, no ischemia or LVH Abdomen: Soft, non tender, no organomegaly or masses. No bruits. Bowel sounds normal. No guarding, tenderness or rebound.    Musculoskeletal exam: Decreased though adequate ROM of lumbar spine, adequate in hips , shoulders and knees. No deformity ,swelling or crepitus noted. No muscle wasting or atrophy.   Neurologic: Cranial nerves 2 to 12 intact. Power, tone ,sensation and reflexes normal throughout. No disturbance in gait. No tremor.  Skin: Intact, no ulceration, erythema , scaling or rash noted. Pigmentation normal throughout  Psych; Normal mood and affect. Judgement and concentration normal   Assessment & Plan:  Annual physical exam Annual exam as documented. Counseling done  re healthy lifestyle involving commitment to 150 minutes exercise per week, heart healthy diet, and attaining healthy weight.The importance of adequate sleep also discussed. Regular seat belt use  and home safety, is also discussed. Changes in health habits are decided on by the patient with goals and time frames  set for achieving them. Immunization and cancer screening needs are specifically addressed at this visit.   Dyslipidemia Hyperlipidemia:Low fat diet discussed and encouraged.   Lipid Panel  Lab Results  Component Value Date   CHOL 202 (H) 07/10/2022   HDL 64 07/10/2022   LDLCALC 121 (H) 07/10/2022   TRIG 94 07/10/2022   CHOLHDL 3.2 07/10/2022     Updated lab needed at/ before next visit.   ED (erectile dysfunction) Unresponsive to max medical therapy refer Urology  Light headedness New onset intermittent light headedness,carotid doppler ordereed  Carotid bruit Carotid doppler to evaluate  Essential hypertension Needs EKG none in over 5 years, long h/o resistant HTN . EKG shows sinusbradycardia with no ischemia  or LVH

## 2023-05-20 NOTE — Assessment & Plan Note (Signed)
Carotid doppler to evaluate

## 2023-05-20 NOTE — Assessment & Plan Note (Signed)
Hyperlipidemia:Low fat diet discussed and encouraged.   Lipid Panel  Lab Results  Component Value Date   CHOL 202 (H) 07/10/2022   HDL 64 07/10/2022   LDLCALC 121 (H) 07/10/2022   TRIG 94 07/10/2022   CHOLHDL 3.2 07/10/2022     Updated lab needed at/ before next visit.

## 2023-05-20 NOTE — Assessment & Plan Note (Signed)
Unresponsive to max medical therapy refer Urology

## 2023-05-20 NOTE — Assessment & Plan Note (Signed)
Needs EKG none in over 5 years, long h/o resistant HTN . EKG shows sinusbradycardia with no ischemia  or LVH

## 2023-05-20 NOTE — Patient Instructions (Addendum)
F/U early December, call if you need me sooner  Please add lipid, hepatic panel, TSH and vit D to lab drawn this am  You are referred to Urology  EKG today and you are referred for Carotid US to look at circulation to your brain  Plan on getting covid and flu vaccines in the  Fall please  Next colonoscopy is due in 2025  It is important that you exercise regularly at least 30 minutes 5 times a week. If you develop chest pain, have severe difficulty breathing, or feel very tired, stop exercising immediately and seek medical attention  Think about what you will eat, plan ahead. Choose " clean, green, fresh or frozen" over canned, processed or packaged foods which are more sugary, salty and fatty. 70 to 75% of food eaten should be vegetables and fruit. Three meals at set times with snacks allowed between meals, but they must be fruit or vegetables. Aim to eat over a 12 hour period , example 7 am to 7 pm, and STOP after  your last meal of the day. Drink water,generally about 64 ounces per day, no other drink is as healthy. Fruit juice is best enjoyed in a healthy way, by EATING the fruit. Thanks for choosing [facility], we consider it a privelige to serve you.

## 2023-05-20 NOTE — Assessment & Plan Note (Signed)
New onset intermittent light headedness,carotid doppler ordereed

## 2023-05-20 NOTE — Assessment & Plan Note (Signed)

## 2023-05-21 LAB — BASIC METABOLIC PANEL
BUN/Creatinine Ratio: 11 (ref 10–24)
BUN: 14 mg/dL (ref 8–27)
CO2: 26 mmol/L (ref 20–29)
Calcium: 9.8 mg/dL (ref 8.6–10.2)
Chloride: 99 mmol/L (ref 96–106)
Creatinine, Ser: 1.3 mg/dL — ABNORMAL HIGH (ref 0.76–1.27)
Glucose: 94 mg/dL (ref 70–99)
Potassium: 4.5 mmol/L (ref 3.5–5.2)
Sodium: 138 mmol/L (ref 134–144)
eGFR: 59 mL/min/{1.73_m2} — ABNORMAL LOW (ref >59)

## 2023-05-23 LAB — HEPATIC FUNCTION PANEL
ALT: 17 IU/L (ref 0–44)
Albumin: 4.5 g/dL (ref 3.9–4.9)
Alkaline Phosphatase: 60 IU/L (ref 44–121)
Total Protein: 7.1 g/dL (ref 6.0–8.5)

## 2023-05-23 LAB — LIPID PANEL
Chol/HDL Ratio: 3.6 ratio (ref 0.0–5.0)
Cholesterol, Total: 238 mg/dL — ABNORMAL HIGH (ref 100–199)
LDL Chol Calc (NIH): 158 mg/dL — ABNORMAL HIGH (ref 0–99)

## 2023-05-23 LAB — VITAMIN D 25 HYDROXY (VIT D DEFICIENCY, FRACTURES)

## 2023-05-24 LAB — SPECIMEN STATUS REPORT

## 2023-05-24 LAB — LIPID PANEL
HDL: 66 mg/dL (ref >39)
Triglycerides: 81 mg/dL (ref 0–149)
VLDL Cholesterol Cal: 14 mg/dL (ref 5–40)

## 2023-05-24 LAB — HEPATIC FUNCTION PANEL
AST: 18 IU/L (ref 0–40)
Bilirubin Total: 1 mg/dL (ref 0.0–1.2)
Bilirubin, Direct: 0.21 mg/dL (ref 0.00–0.40)

## 2023-05-24 LAB — TSH: TSH: 1.84 u[IU]/mL (ref 0.450–4.500)

## 2023-05-28 MED ORDER — VITAMIN D (ERGOCALCIFEROL) 1.25 MG (50000 UNIT) PO CAPS
50000.0000 [IU] | ORAL_CAPSULE | ORAL | 6 refills | Status: DC
Start: 2023-05-28 — End: 2023-10-22

## 2023-05-28 NOTE — Addendum Note (Signed)
Addended by: Kerri Perches on: 05/28/2023 07:58 AM   Modules accepted: Orders

## 2023-06-27 ENCOUNTER — Other Ambulatory Visit: Payer: Self-pay | Admitting: Family Medicine

## 2023-07-17 ENCOUNTER — Ambulatory Visit: Payer: Medicare Other | Admitting: Urology

## 2023-07-29 ENCOUNTER — Encounter (INDEPENDENT_AMBULATORY_CARE_PROVIDER_SITE_OTHER): Payer: Self-pay | Admitting: *Deleted

## 2023-08-07 ENCOUNTER — Other Ambulatory Visit (INDEPENDENT_AMBULATORY_CARE_PROVIDER_SITE_OTHER): Payer: Self-pay | Admitting: *Deleted

## 2023-08-07 ENCOUNTER — Telehealth (INDEPENDENT_AMBULATORY_CARE_PROVIDER_SITE_OTHER): Payer: Self-pay | Admitting: *Deleted

## 2023-08-07 DIAGNOSIS — K7400 Hepatic fibrosis, unspecified: Secondary | ICD-10-CM

## 2023-08-07 NOTE — Telephone Encounter (Signed)
Patient in reminder file for afp in October. Orders mailed to patient with a note to do in October.

## 2023-08-13 ENCOUNTER — Telehealth (INDEPENDENT_AMBULATORY_CARE_PROVIDER_SITE_OTHER): Payer: Self-pay | Admitting: Gastroenterology

## 2023-08-13 DIAGNOSIS — Z8619 Personal history of other infectious and parasitic diseases: Secondary | ICD-10-CM

## 2023-08-13 NOTE — Telephone Encounter (Signed)
Ultrasound ordered. Attempted to reach pt but wife answered. Calling to see if pt had a preference on time/date for Ultrasound. Advised wife to have pt give [place] a call

## 2023-08-13 NOTE — Addendum Note (Signed)
Addended by: Marlowe Shores on: 08/13/2023 03:55 PM   Modules accepted: Orders

## 2023-08-13 NOTE — Telephone Encounter (Signed)
Pt left voicemail in regards to scheduling ultrasound.  Last ultrasound completed 01/31/23 ([place] elastography liver). Do I need to schedule ultrasound? Please advise. Thank you

## 2023-08-21 ENCOUNTER — Ambulatory Visit: Payer: Medicare Other | Admitting: Urology

## 2023-08-22 LAB — AFP TUMOR MARKER: AFP, Serum, Tumor Marker: 4.2 ng/mL (ref 0.0–8.4)

## 2023-08-26 ENCOUNTER — Encounter: Payer: Self-pay | Admitting: Urology

## 2023-08-26 ENCOUNTER — Ambulatory Visit: Payer: Medicare Other | Admitting: Urology

## 2023-08-26 VITALS — BP 104/65 | HR 71

## 2023-08-26 DIAGNOSIS — N5201 Erectile dysfunction due to arterial insufficiency: Secondary | ICD-10-CM | POA: Diagnosis not present

## 2023-08-26 DIAGNOSIS — N500 Atrophy of testis: Secondary | ICD-10-CM

## 2023-08-26 MED ORDER — TADALAFIL 20 MG PO TABS
20.0000 mg | ORAL_TABLET | ORAL | 11 refills | Status: DC | PRN
Start: 2023-08-26 — End: 2023-08-28

## 2023-08-26 NOTE — Progress Notes (Signed)
H&P  Chief Complaint: Erectile problems  History of Present Illness: 72 year old male, patient of Dr. Syliva Overman, referred for further management of ED.  He has been on mail order Cialis for quite some time-he takes 20 mg at a time.  He gets this from [place].  Despite this, he does not have significant improvement in erections and certainly does not have the ability to penetrate.  Past Medical History:  Diagnosis Date   Allergy    Arthritis    ED (erectile dysfunction)    Hepatitis C    Hyperlipidemia    Hypertension     Past Surgical History:  Procedure Laterality Date   APPENDECTOMY     CHOLECYSTECTOMY N/A 04/25/2014   Procedure: LAPAROSCOPIC CHOLECYSTECTOMY;  Surgeon: Dalia Heading, MD;  Location: [facility];  Service: General;  Laterality: N/A;   COLONOSCOPY N/A 09/01/2019   Procedure: COLONOSCOPY;  Surgeon: Malissa Hippo, MD;  Location: [facility];  Service: Endoscopy;  Laterality: N/A;  930   HEMICOLECTOMY Right    lap hand assisted   POLYPECTOMY  09/01/2019   Procedure: POLYPECTOMY;  Surgeon: Malissa Hippo, MD;  Location: [facility];  Service: Endoscopy;;  colon   right shoulder surgery Right 03/2005   rotator cuff repair    Home Medications:  Allergies as of 08/26/2023   No Known Allergies      Medication List        Accurate as of August 26, 2023  8:27 AM. If you have any questions, ask your nurse or doctor.          amLODipine 10 MG tablet Commonly known as: NORVASC TAKE ONE TABLET EVERY EVENING AT 8 PM FOR BLOOD PRESSURE   cholecalciferol 25 MCG (1000 UNIT) tablet Commonly known as: VITAMIN D3 Take 1,000 Units by mouth daily.   fluticasone 50 MCG/ACT nasal spray Commonly known as: FLONASE SPRAY 2 SPRAYS INTO EACH NOSTRIL EVERY DAY   losartan 100 MG tablet Commonly known as: COZAAR TAKE 1 TABLET BY MOUTH EVERY DAY   spironolactone 25 MG tablet Commonly known as: ALDACTONE TAKE 1 TABLET (25 MG TOTAL) BY MOUTH DAILY.    tadalafil 20 MG tablet Commonly known as: CIALIS Take one tablet by mouth every 3 days as needed, for erectile dysfunction   Vitamin D (Ergocalciferol) 1.25 MG (50000 UNIT) Caps capsule Commonly known as: DRISDOL Take 1 capsule (50,000 Units total) by mouth every 7 (seven) days.        Allergies: No Known Allergies  Family History  Problem Relation Age of Onset   Hypertension Mother    Cancer Father        colon    Hypertension Sister    Hypertension Brother    Diabetes Brother    Hypertension Daughter    Hypertension Son     Social History:  reports that he quit smoking about 51 years ago. His smoking use included cigarettes. He started smoking about 66 years ago. He has a 3.8 pack-year smoking history. He has been exposed to tobacco smoke. He has never used smokeless tobacco. He reports that he does not currently use alcohol. He reports that he does not use drugs.  ROS: A complete review of systems was performed.  All systems are negative except for pertinent findings as noted.  Physical Exam:  Vital signs in last 24 hours: There were no vitals taken for this visit. Constitutional:  Alert and oriented, No acute distress Cardiovascular: Regular rate  Respiratory: Normal respiratory effort  GI: Abdomen is soft, nontender, nondistended, no abdominal masses. No CVAT.  Genitourinary: Phallus uncertain.  No phallic lesions.  Meatus normal.  Scrotal skin normal.  Testicles atrophic bilaterally. Lymphatic: No lymphadenopathy Neurologic: Grossly intact, no focal deficits Psychiatric: Normal mood and affect  I have reviewed notes from referring/previous physicians  I have reviewed urinalysis results  I have reviewed prior PSA results--last PSA 0.91-year ago  I have reviewed prior CBC and blood chemistries   Impression/Assessment:  ED, fairly longstanding.  Not responding to Cialis but he gets this online through [place].  I cannot vouch for strength of medication  Plan:   1.  I would recommend that he buy his Cialis locally and have sent a prescription into [facility] pharmacy  2.  I will have him come back in a couple of months.  If significant issues still, consider starting intracorporeal injections.

## 2023-08-26 NOTE — Telephone Encounter (Signed)
Pt wife contacted. [place] has not been scheduled at this time. Pt has urology appt today and wife will tell pt to come by office before or after appt to schedule [place].

## 2023-08-27 ENCOUNTER — Encounter (INDEPENDENT_AMBULATORY_CARE_PROVIDER_SITE_OTHER): Payer: Self-pay

## 2023-08-28 ENCOUNTER — Telehealth: Payer: Self-pay | Admitting: Urology

## 2023-08-28 ENCOUNTER — Other Ambulatory Visit: Payer: Self-pay

## 2023-08-28 DIAGNOSIS — N5201 Erectile dysfunction due to arterial insufficiency: Secondary | ICD-10-CM

## 2023-08-28 MED ORDER — TADALAFIL 20 MG PO TABS
20.0000 mg | ORAL_TABLET | ORAL | 11 refills | Status: DC | PRN
Start: 2023-08-28 — End: 2024-08-19

## 2023-08-28 NOTE — Telephone Encounter (Signed)
Rs updated and resent to pharmacy.

## 2023-08-28 NOTE — Telephone Encounter (Signed)
Patient called back and was given update that medication was corrected and sent to [facility].  He thanked [place] for handling this issue.

## 2023-08-28 NOTE — Telephone Encounter (Signed)
tadalafil (CIALIS) 20 MG tablet [[id number]]  medication was set to print instead of normal and it did not go to the Pharmacy, please re-send to [facility] in [place]

## 2023-09-15 ENCOUNTER — Ambulatory Visit (HOSPITAL_COMMUNITY)
Admission: RE | Admit: 2023-09-15 | Discharge: 2023-09-15 | Disposition: A | Discharge status: Home / Self Care | Payer: Medicare Other | Source: Ambulatory Visit | Attending: Gastroenterology | Admitting: Gastroenterology

## 2023-09-15 DIAGNOSIS — Z8619 Personal history of other infectious and parasitic diseases: Secondary | ICD-10-CM | POA: Diagnosis present

## 2023-09-25 ENCOUNTER — Other Ambulatory Visit: Payer: Self-pay | Admitting: Family Medicine

## 2023-10-01 ENCOUNTER — Other Ambulatory Visit: Payer: Medicare Other

## 2023-10-01 DIAGNOSIS — N500 Atrophy of testis: Secondary | ICD-10-CM

## 2023-10-04 LAB — TESTOSTERONE,FREE AND TOTAL
Testosterone, Free: 10.8 pg/mL (ref 6.6–18.1)
Testosterone: 828 ng/dL (ref 264–916)

## 2023-10-14 ENCOUNTER — Ambulatory Visit: Payer: Medicare Other | Admitting: Urology

## 2023-10-14 ENCOUNTER — Telehealth: Payer: Self-pay

## 2023-10-14 NOTE — Telephone Encounter (Signed)
Pt was called to let him know his Testerone results, he didn't answer so a voicemail was left

## 2023-10-22 ENCOUNTER — Encounter: Payer: Self-pay | Admitting: Family Medicine

## 2023-10-22 ENCOUNTER — Ambulatory Visit: Payer: Medicare Other | Admitting: Family Medicine

## 2023-10-22 VITALS — BP 133/81 | HR 72 | Ht 73.0 in | Wt 176.0 lb

## 2023-10-22 DIAGNOSIS — J3089 Other allergic rhinitis: Secondary | ICD-10-CM

## 2023-10-22 DIAGNOSIS — E785 Hyperlipidemia, unspecified: Secondary | ICD-10-CM

## 2023-10-22 DIAGNOSIS — E559 Vitamin D deficiency, unspecified: Secondary | ICD-10-CM | POA: Diagnosis not present

## 2023-10-22 DIAGNOSIS — I10 Essential (primary) hypertension: Secondary | ICD-10-CM

## 2023-10-22 DIAGNOSIS — Z125 Encounter for screening for malignant neoplasm of prostate: Secondary | ICD-10-CM

## 2023-10-22 NOTE — Patient Instructions (Addendum)
   Annual exam July 17, ., 20205   Fasting CBC, lipid, cmp and eGFR, PSA tomorrow  Please get current covid vaccine  Please schedule wellness at checkout  It is important that you exercise regularly at least 30 minutes 5 times a week. If you develop chest pain, have severe difficulty breathing, or feel very tired, stop exercising immediately and seek medical attention   Think about what you will eat, plan ahead. Choose " clean, green, fresh or frozen" over canned, processed or packaged foods which are more sugary, salty and fatty. 70 to 75% of food eaten should be vegetables and fruit. Three meals at set times with snacks allowed between meals, but they must be fruit or vegetables. Aim to eat over a 12 hour period , example 7 am to 7 pm, and STOP after  your last meal of the day. Drink water,generally about 64 ounces per day, no other drink is as healthy. Fruit juice is best enjoyed in a healthy way, by EATING the fruit.  Thanks for choosing [facility], we consider it a privelige to serve you.

## 2023-10-25 LAB — CMP14+EGFR
ALT: 18 [IU]/L (ref 0–44)
AST: 20 [IU]/L (ref 0–40)
Albumin: 4.1 g/dL (ref 3.8–4.8)
Alkaline Phosphatase: 53 [IU]/L (ref 44–121)
BUN/Creatinine Ratio: 11 (ref 10–24)
BUN: 13 mg/dL (ref 8–27)
Bilirubin Total: 1.2 mg/dL (ref 0.0–1.2)
CO2: 24 mmol/L (ref 20–29)
Calcium: 9.6 mg/dL (ref 8.6–10.2)
Chloride: 102 mmol/L (ref 96–106)
Creatinine, Ser: 1.18 mg/dL (ref 0.76–1.27)
Globulin, Total: 2.4 g/dL (ref 1.5–4.5)
Glucose: 87 mg/dL (ref 70–99)
Potassium: 4.3 mmol/L (ref 3.5–5.2)
Sodium: 138 mmol/L (ref 134–144)
Total Protein: 6.5 g/dL (ref 6.0–8.5)
eGFR: 66 mL/min/{1.73_m2} (ref >59)

## 2023-10-25 LAB — LIPID PANEL
Chol/HDL Ratio: 3.2 {ratio} (ref 0.0–5.0)
Cholesterol, Total: 188 mg/dL (ref 100–199)
HDL: 58 mg/dL (ref >39)
LDL Chol Calc (NIH): 118 mg/dL — ABNORMAL HIGH (ref 0–99)
Triglycerides: 64 mg/dL (ref 0–149)
VLDL Cholesterol Cal: 12 mg/dL (ref 5–40)

## 2023-10-25 LAB — CBC
Hematocrit: 39.2 % (ref 37.5–51.0)
Hemoglobin: 13 g/dL (ref 13.0–17.7)
MCH: 28.1 pg (ref 26.6–33.0)
MCHC: 33.2 g/dL (ref 31.5–35.7)
MCV: 85 fL (ref 79–97)
Platelets: 243 10*3/uL (ref 150–450)
RBC: 4.62 x10E6/uL (ref 4.14–5.80)
RDW: 13.8 % (ref 11.6–15.4)
WBC: 3.6 10*3/uL (ref 3.4–10.8)

## 2023-10-25 LAB — PSA: Prostate Specific Ag, Serum: 1 ng/mL (ref 0.0–4.0)

## 2023-10-30 DIAGNOSIS — E559 Vitamin D deficiency, unspecified: Secondary | ICD-10-CM | POA: Insufficient documentation

## 2023-10-30 DIAGNOSIS — Z125 Encounter for screening for malignant neoplasm of prostate: Secondary | ICD-10-CM | POA: Insufficient documentation

## 2023-10-30 NOTE — Assessment & Plan Note (Signed)
Hyperlipidemia:Low fat diet discussed and encouraged.   Lipid Panel  Lab Results  Component Value Date   CHOL 188 10/24/2023   HDL 58 10/24/2023   LDLCALC 118 (H) 10/24/2023   TRIG 64 10/24/2023   CHOLHDL 3.2 10/24/2023     Needs to lower fat intake

## 2023-10-30 NOTE — Assessment & Plan Note (Signed)
Controlled, no change in medication DASH diet and commitment to daily physical activity for a minimum of 30 minutes discussed and encouraged, as a part of hypertension management. The importance of attaining a healthy weight is also discussed.     10/22/2023    2:50 PM 08/26/2023    2:19 PM 05/20/2023    1:47 PM 05/20/2023    1:05 PM 01/06/2023    9:04 AM 11/19/2022    1:28 PM 11/18/2022    2:06 PM  BP/Weight  Systolic BP 133 104 120 129 104 124 132  Diastolic BP 81 65 80 81 69 72 74  Wt. (Lbs) 176.04   171.08 176 176.08   BMI 23.23 kg/m2   22.57 kg/m2 23.22 kg/m2 23.23 kg/m2

## 2023-10-30 NOTE — Assessment & Plan Note (Signed)
Controlled, no change in medication  

## 2023-10-30 NOTE — Assessment & Plan Note (Signed)
Updated lab needed at/ before next visit.   

## 2023-10-30 NOTE — Progress Notes (Signed)
   ZIXUAN Martinez     MRN: [id number]      DOB: November 15, 1950  Chief Complaint  Patient presents with   Follow-up    Follow up    HPI [name] is here for follow up and re-evaluation of chronic medical conditions, medication management and review of any available recent lab and radiology data.  Preventive health is updated, specifically  Cancer screening and Immunization.   Questions or concerns regarding consultations or procedures which the PT has had in the interim are  addressed. The PT denies any adverse reactions to current medications since the last visit.  There are no new concerns.  There are no specific complaints   ROS Denies recent fever or chills. Denies sinus pressure, nasal congestion, ear pain or sore throat. Denies chest congestion, productive cough or wheezing. Denies chest pains, palpitations and leg swelling Denies abdominal pain, nausea, vomiting,diarrhea or constipation.   Denies dysuria, frequency, hesitancy or incontinence. Denies joint pain, swelling and limitation in mobility. Denies headaches, seizures, numbness, or tingling. Denies depression, anxiety or insomnia. Denies skin break down or rash.   PE  BP 133/81 (BP Location: Right Arm, Patient Position: Sitting, Cuff Size: Large)   Pulse 72   Ht 6\' 1"  (1.854 m)   Wt 176 lb 0.6 oz (79.9 kg)   SpO2 98%   BMI 23.23 kg/m   Patient alert and oriented and in no cardiopulmonary distress.  HEENT: No facial asymmetry, EOMI,     Neck supple .  Chest: Clear to auscultation bilaterally.  CVS: S1, S2 no murmurs, no S3.Regular rate.  ABD: Soft non tender.   Ext: No edema  MS: Adequate ROM spine, shoulders, hips and knees.  Skin: Intact, no ulcerations or rash noted.  Psych: Good eye contact, normal affect. Memory intact not anxious or depressed appearing.  CNS: CN 2-12 intact, power,  normal throughout.no focal deficits noted.   Assessment & Plan  Essential hypertension Controlled, no  change in medication DASH diet and commitment to daily physical activity for a minimum of 30 minutes discussed and encouraged, as a part of hypertension management. The importance of attaining a healthy weight is also discussed.     10/22/2023    2:50 PM 08/26/2023    2:19 PM 05/20/2023    1:47 PM 05/20/2023    1:05 PM 01/06/2023    9:04 AM 11/19/2022    1:28 PM 11/18/2022    2:06 PM  BP/Weight  Systolic BP 133 104 120 129 104 124 132  Diastolic BP 81 65 80 81 69 72 74  Wt. (Lbs) 176.04   171.08 176 176.08   BMI 23.23 kg/m2   22.57 kg/m2 23.22 kg/m2 23.23 kg/m2        Dyslipidemia Hyperlipidemia:Low fat diet discussed and encouraged.   Lipid Panel  Lab Results  Component Value Date   CHOL 188 10/24/2023   HDL 58 10/24/2023   LDLCALC 118 (H) 10/24/2023   TRIG 64 10/24/2023   CHOLHDL 3.2 10/24/2023     Needs to lower fat intake  Allergic rhinitis Controlled, no change in medication

## 2023-12-01 NOTE — Progress Notes (Incomplete)
History of Present Illness:   This gentleman returns today for follow-up of ED.  At his first visit here, he was switched from mail order tadalafil to a local supplier.  Past Medical History:  Diagnosis Date   Allergy    Arthritis    ED (erectile dysfunction)    Hepatitis C    Hyperlipidemia    Hypertension     Past Surgical History:  Procedure Laterality Date   APPENDECTOMY     CHOLECYSTECTOMY N/A 04/25/2014   Procedure: LAPAROSCOPIC CHOLECYSTECTOMY;  Surgeon: Dalia Heading, MD;  Location: [facility];  Service: General;  Laterality: N/A;   COLONOSCOPY N/A 09/01/2019   Procedure: COLONOSCOPY;  Surgeon: Malissa Hippo, MD;  [facility];  Service: Endoscopy;  Laterality: N/A;  930   HEMICOLECTOMY Right    lap hand assisted   POLYPECTOMY  09/01/2019   Procedure: POLYPECTOMY;  Surgeon: Malissa Hippo, MD;  Location: [facility];  Service: Endoscopy;;  colon   right shoulder surgery Right 03/2005   rotator cuff repair    Home Medications:  Allergies as of 12/02/2023   No Known Allergies      Medication List        Accurate as of December 01, 2023 11:51 AM. If you have any questions, ask your nurse or doctor.          amLODipine 10 MG tablet Commonly known as: NORVASC TAKE ONE TABLET EVERY EVENING AT 8 PM FOR BLOOD PRESSURE   cholecalciferol 25 MCG (1000 UNIT) tablet Commonly known as: VITAMIN D3 Take 1,000 Units by mouth daily.   fluticasone 50 MCG/ACT nasal spray Commonly known as: FLONASE SPRAY 2 SPRAYS INTO EACH NOSTRIL EVERY DAY   losartan 100 MG tablet Commonly known as: COZAAR TAKE 1 TABLET BY MOUTH EVERY DAY   spironolactone 25 MG tablet Commonly known as: ALDACTONE TAKE 1 TABLET (25 MG TOTAL) BY MOUTH DAILY.   tadalafil 20 MG tablet Commonly known as: CIALIS Take 1 tablet (20 mg total) by mouth as needed for erectile dysfunction.        Allergies: No Known Allergies  Family History  Problem Relation Age of Onset   Hypertension  Mother    Cancer Father        colon    Hypertension Sister    Hypertension Brother    Diabetes Brother    Hypertension Daughter    Hypertension Son     Social History:  reports that he quit smoking about 51 years ago. His smoking use included cigarettes. He started smoking about 66 years ago. He has a 3.8 pack-year smoking history. He has been exposed to tobacco smoke. He has never used smokeless tobacco. He reports that he does not currently use alcohol. He reports that he does not use drugs.  ROS: A complete review of systems was performed.  All systems are negative except for pertinent findings as noted.  Physical Exam:  Vital signs in last 24 hours: There were no vitals taken for this visit. Constitutional:  Alert and oriented, No acute distress Cardiovascular: Regular rate  Respiratory: Normal respiratory effort Neurologic: Grossly intact, no focal deficits Psychiatric: Normal mood and affect  I have reviewed prior pt notes  I have reviewed urinalysis results  I have reviewed prior PSA results--1.0 a month ago   Impression/Assessment:  ***  Plan:  ***

## 2023-12-02 ENCOUNTER — Ambulatory Visit: Payer: Medicare Other | Admitting: Urology

## 2023-12-02 DIAGNOSIS — N5201 Erectile dysfunction due to arterial insufficiency: Secondary | ICD-10-CM

## 2024-01-05 ENCOUNTER — Ambulatory Visit (INDEPENDENT_AMBULATORY_CARE_PROVIDER_SITE_OTHER): Payer: Medicare Other | Admitting: Gastroenterology

## 2024-01-05 ENCOUNTER — Encounter (INDEPENDENT_AMBULATORY_CARE_PROVIDER_SITE_OTHER): Payer: Self-pay | Admitting: Gastroenterology

## 2024-01-05 VITALS — BP 129/75 | HR 65 | Temp 97.9°F | Ht 72.0 in | Wt 181.6 lb

## 2024-01-05 DIAGNOSIS — K7402 Hepatic fibrosis, advanced fibrosis: Secondary | ICD-10-CM

## 2024-01-05 DIAGNOSIS — Z8619 Personal history of other infectious and parasitic diseases: Secondary | ICD-10-CM

## 2024-01-05 DIAGNOSIS — B182 Chronic viral hepatitis C: Secondary | ICD-10-CM

## 2024-01-05 DIAGNOSIS — K7400 Hepatic fibrosis, unspecified: Secondary | ICD-10-CM

## 2024-01-05 NOTE — Patient Instructions (Signed)
 Perform blood workup in May 2025 Will need to proceed with right upper quadrant ultrasound in May 2025 - already on recall list

## 2024-01-05 NOTE — Progress Notes (Signed)
 Katrinka Blazing, M.D. Gastroenterology & Hepatology [facility]enter[facility] [address] [place], [place] [address]  Primary Care Physician: Kerri Perches, MD [address] [place] [address]  I will communicate my assessment and recommendations to the referring MD via EMR.  Problems: History of hepatitis C with advanced fibrosis (F3)  History of Present Illness: Bryce Martinez is a 73 y.o. male with past medical history of arthritis, ED, Hep C status posttreatment and SVR complicated by F2/F3 fibrosis, HLD, HTN, coming for surveillance of liver fibrosis.  The patient was last seen on 01/06/2023. At that time, the patient had elastography and AFP scheduled.  aFP was 5.3.  Elastography was ordered and performed on 01/31/2023 which showed a low K PA of 3.7 with ratio of 0.11 but with ultrasound changes concerning for possible cirrhosis or fibrosis.  Due to this, FibroTest was checked on 02/06/2023 which was consistent with F3 fibrosis.  Most recent right upper quadrant ultrasound on 09/15/2023 was normal.  Has no complaints. The patient denies having any nausea, vomiting, fever, chills, hematochezia, melena, hematemesis, abdominal distention, abdominal pain, diarrhea, jaundice, pruritus or weight loss.  Does not drink alcohol  Last Colonoscopy: 08/2019 - Perianal skin tags found on perianal exam.                           - The examined portion of the ileum was normal.                           - Patent side-to-side ileo-colonic anastomosis.                           - One 5 mm polyp in the transverse colon, removed                            with a cold snare. Resected and retrieved.                           - One small polyp at the splenic flexure, removed                            with a cold snare. Complete resection. Polyp tissue                            not retrieved.  Recommended repeat colonoscopy in 5 years.  Past  Medical History: Past Medical History:  Diagnosis Date   Allergy    Arthritis    ED (erectile dysfunction)    Hepatitis C    Hyperlipidemia    Hypertension     Past Surgical History: Past Surgical History:  Procedure Laterality Date   APPENDECTOMY     CHOLECYSTECTOMY N/A 04/25/2014   Procedure: LAPAROSCOPIC CHOLECYSTECTOMY;  Surgeon: Dalia Heading, MD;  Location: [facility];  Service: General;  Laterality: N/A;   COLONOSCOPY N/A 09/01/2019   Procedure: COLONOSCOPY;  Surgeon: Malissa Hippo, MD;  [facility];  Service: Endoscopy;  Laterality: N/A;  930   HEMICOLECTOMY Right    lap hand assisted   POLYPECTOMY  09/01/2019   Procedure: POLYPECTOMY;  Surgeon: Malissa Hippo, MD;  [facility];  Service: Endoscopy;;  colon   right shoulder surgery Right 03/2005   rotator cuff repair    Family History: Family History  Problem Relation Age of Onset   Hypertension Mother    Cancer Father        colon    Hypertension Sister    Hypertension Brother    Diabetes Brother    Hypertension Daughter    Hypertension Son     Social History: Social History   Tobacco Use  Smoking Status Former   Current packs/day: 0.00   Average packs/day: 0.3 packs/day for 15.0 years (3.8 ttl pk-yrs)   Types: Cigarettes   Start date: 04/19/1957   Quit date: 04/19/1972   Years since quitting: 51.7   Passive exposure: Past  Smokeless Tobacco Never   Social History   Substance and Sexual Activity  Alcohol Use Not Currently   Comment: special occasions   Social History   Substance and Sexual Activity  Drug Use No    Allergies: No Known Allergies  Medications: Current Outpatient Medications  Medication Sig Dispense Refill   amLODipine (NORVASC) 10 MG tablet TAKE ONE TABLET EVERY EVENING AT 8 PM FOR BLOOD PRESSURE 90 tablet 1   cholecalciferol (VITAMIN D3) 25 MCG (1000 UNIT) tablet Take 1,000 Units by mouth daily.     fluticasone (FLONASE) 50 MCG/ACT nasal spray  SPRAY 2 SPRAYS INTO EACH NOSTRIL EVERY DAY 48 mL 1   losartan (COZAAR) 100 MG tablet TAKE 1 TABLET BY MOUTH EVERY DAY 90 tablet 1   spironolactone (ALDACTONE) 25 MG tablet TAKE 1 TABLET (25 MG TOTAL) BY MOUTH DAILY. 90 tablet 3   tadalafil (CIALIS) 20 MG tablet Take 1 tablet (20 mg total) by mouth as needed for erectile dysfunction. 15 tablet 11   No current facility-administered medications for this visit.    Review of Systems: GENERAL: negative for malaise, night sweats HEENT: No changes in hearing or vision, no nose bleeds or other nasal problems. NECK: Negative for lumps, goiter, pain and significant neck swelling RESPIRATORY: Negative for cough, wheezing CARDIOVASCULAR: Negative for chest pain, leg swelling, palpitations, orthopnea GI: SEE HPI MUSCULOSKELETAL: Negative for joint pain or swelling, back pain, and muscle pain. SKIN: Negative for lesions, rash PSYCH: Negative for sleep disturbance, mood disorder and recent psychosocial stressors. HEMATOLOGY Negative for prolonged bleeding, bruising easily, and swollen nodes. ENDOCRINE: Negative for cold or heat intolerance, polyuria, polydipsia and goiter. NEURO: negative for tremor, gait imbalance, syncope and seizures. The remainder of the review of systems is noncontributory.   Physical Exam: BP 129/75   Pulse 65   Temp 97.9 F (36.6 C) (Oral)   Ht 6' (1.829 m)   Wt 181 lb 9.6 oz (82.4 kg)   BMI 24.63 kg/m  GENERAL: The patient is AO x3, in no acute distress. HEENT: Head is normocephalic and atraumatic. EOMI are intact. Mouth is well hydrated and without lesions. NECK: Supple. No masses LUNGS: Clear to auscultation. No presence of rhonchi/wheezing/rales. Adequate chest expansion HEART: RRR, normal s1 and s2. ABDOMEN: Soft, nontender, no guarding, no peritoneal signs, and nondistended. BS +. No masses. EXTREMITIES: Without any cyanosis, clubbing, rash, lesions or edema. NEUROLOGIC: AOx3, no focal motor deficit. SKIN: no  jaundice, no rashes  Imaging/Labs: as above  I personally reviewed and interpreted the available labs, imaging and endoscopic files.  Impression and Plan: LUDWIN FLAHIVE is a 73 y.o. male with past medical history of arthritis, ED, Hep C status posttreatment and SVR complicated by F2/F3 fibrosis,  HLD, HTN, coming for surveillance of liver fibrosis.  Patient has been asymptomatic and denies any complaints.  Most recent serology showing F3 fibrosis, consistent with previous imaging findings.  He does not have any abnormalities in his blood workup suggestive of further progression.  We discussed the need to proceed with HCC screening lifelong.  He will need to proceed with ultrasound and AFP in May 2025.  -Check CBC, CMP, INR and AFP in May 2025 - Will need to proceed with right upper quadrant ultrasound in May 2025 - already on recall list  All questions were answered.      Katrinka Blazing, MD Gastroenterology and Hepatology [facility] Gastroenter[facility]

## 2024-02-02 NOTE — Progress Notes (Signed)
 History of Present Illness: Bryce Martinez is here for f/u of ED. At his 1st visit in Oct., 2024 he was given scrip for cialis (prior cialis purchased through mail-order).  Unfortunately, the branded/local Cialis did not have adequate effects for his erections.  Past Medical History:  Diagnosis Date   Allergy    Arthritis    ED (erectile dysfunction)    Hepatitis C    Hyperlipidemia    Hypertension     Past Surgical History:  Procedure Laterality Date   APPENDECTOMY     CHOLECYSTECTOMY N/A 04/25/2014   Procedure: LAPAROSCOPIC CHOLECYSTECTOMY;  Surgeon: Dalia Heading, MD;  Location: [facility];  Service: General;  Laterality: N/A;   COLONOSCOPY N/A 09/01/2019   Procedure: COLONOSCOPY;  Surgeon: Malissa Hippo, MD;  [facility];  Service: Endoscopy;  Laterality: N/A;  930   HEMICOLECTOMY Right    lap hand assisted   POLYPECTOMY  09/01/2019   Procedure: POLYPECTOMY;  Surgeon: Malissa Hippo, MD;  [facility];  Service: Endoscopy;;  colon   right shoulder surgery Right 03/2005   rotator cuff repair    Home Medications:  Allergies as of 02/03/2024   No Known Allergies      Medication List        Accurate as of February 02, 2024  8:10 PM. If you have any questions, ask your nurse or doctor.          amLODipine 10 MG tablet Commonly known as: NORVASC TAKE ONE TABLET EVERY EVENING AT 8 PM FOR BLOOD PRESSURE   cholecalciferol 25 MCG (1000 UNIT) tablet Commonly known as: VITAMIN D3 Take 1,000 Units by mouth daily.   fluticasone 50 MCG/ACT nasal spray Commonly known as: FLONASE SPRAY 2 SPRAYS INTO EACH NOSTRIL EVERY DAY   losartan 100 MG tablet Commonly known as: COZAAR TAKE 1 TABLET BY MOUTH EVERY DAY   spironolactone 25 MG tablet Commonly known as: ALDACTONE TAKE 1 TABLET (25 MG TOTAL) BY MOUTH DAILY.   tadalafil 20 MG tablet Commonly known as: CIALIS Take 1 tablet (20 mg total) by mouth as needed for erectile dysfunction.         Allergies: No Known Allergies  Family History  Problem Relation Age of Onset   Hypertension Mother    Cancer Father        colon    Hypertension Sister    Hypertension Brother    Diabetes Brother    Hypertension Daughter    Hypertension Son     Social History:  reports that he quit smoking about 51 years ago. His smoking use included cigarettes. He started smoking about 66 years ago. He has a 3.8 pack-year smoking history. He has been exposed to tobacco smoke. He has never used smokeless tobacco. He reports that he does not currently use alcohol. He reports that he does not use drugs.  ROS: A complete review of systems was performed.  All systems are negative except for pertinent findings as noted.  Physical Exam:  Vital signs in last 24 hours: There were no vitals taken for this visit. Constitutional:  Alert and oriented, No acute distress Cardiovascular: Regular rate  Respiratory: Normal respiratory effort GI: Abdomen is soft, nontender, nondistended, no abdominal masses. No CVAT.  Genitourinary: Normal male phallus, testes are descended bilaterally and non-tender and without masses, scrotum is normal in appearance without lesions or masses, perineum is normal on inspection. Lymphatic: No lymphadenopathy Neurologic: Grossly intact, no focal deficits Psychiatric: Normal mood and  affect  I have reviewed prior pt notes  I have reviewed urinalysis results      Impression/Assessment:  ED, thus far not responsive to PDE 5 inhibitors  Plan:  I discussed prostaglandin injections with him.  Side effects discussed.  He would like to go ahead with learning injections.  We will set up an office visit to begin injections, send in a prescription for prostaglandin 20 mcg/milliliters.

## 2024-02-03 ENCOUNTER — Ambulatory Visit: Payer: Medicare Other | Admitting: Urology

## 2024-02-03 VITALS — BP 112/58 | HR 64

## 2024-02-03 DIAGNOSIS — N5201 Erectile dysfunction due to arterial insufficiency: Secondary | ICD-10-CM | POA: Diagnosis not present

## 2024-02-03 MED ORDER — ALPROSTADIL (VASODILATOR) 20 MCG IC SOLR: INTRACAVERNOUS | 0 refills | Status: DC

## 2024-02-04 LAB — URINALYSIS, ROUTINE W REFLEX MICROSCOPIC
Bilirubin, UA: NEGATIVE
Glucose, UA: NEGATIVE
Leukocytes,UA: NEGATIVE
Nitrite, UA: NEGATIVE
Specific Gravity, UA: 1.03 (ref 1.005–1.030)
Urobilinogen, Ur: 2 mg/dL — ABNORMAL HIGH (ref 0.2–1.0)
pH, UA: 6 (ref 5.0–7.5)

## 2024-02-04 LAB — MICROSCOPIC EXAMINATION: Epithelial Cells (non renal): >10 /HPF — AB (ref 0–10)

## 2024-02-12 ENCOUNTER — Encounter (INDEPENDENT_AMBULATORY_CARE_PROVIDER_SITE_OTHER): Payer: Self-pay | Admitting: *Deleted

## 2024-03-03 ENCOUNTER — Telehealth (INDEPENDENT_AMBULATORY_CARE_PROVIDER_SITE_OTHER): Payer: Self-pay | Admitting: Gastroenterology

## 2024-03-03 DIAGNOSIS — B182 Chronic viral hepatitis C: Secondary | ICD-10-CM

## 2024-03-03 NOTE — Telephone Encounter (Signed)
 Pt called in and received his letter for 6 month US . Last US  completed 09/15/23 US  [employer]. Would you like the same US  ordered? Please advise. Thank you!

## 2024-03-04 NOTE — Telephone Encounter (Signed)
 US  ordered and scheduled for 03/19/24 at 8:30am. Pt to arrive at 8:15am. NPO after midnight. Pt contacted and verbalized understanding. Appt letter mailed to pt.

## 2024-03-04 NOTE — Addendum Note (Signed)
 Addended by: Ladaija Dimino on: 03/04/2024 11:19 AM   Modules accepted: Orders

## 2024-03-08 NOTE — Progress Notes (Signed)
 History of Present Illness: Pt here for trainig on ic injections. He is here w/  PGE 20 mcg/milliliter  Past Medical History:  Diagnosis Date   Allergy    Arthritis    ED (erectile dysfunction)    Hepatitis C    Hyperlipidemia    Hypertension     Past Surgical History:  Procedure Laterality Date   APPENDECTOMY     CHOLECYSTECTOMY N/A 04/25/2014   Procedure: LAPAROSCOPIC CHOLECYSTECTOMY;  Surgeon: Beau Bound, MD;  Location: [facility];  Service: General;  Laterality: N/A;   COLONOSCOPY N/A 09/01/2019   Procedure: COLONOSCOPY;  Surgeon: Ruby Corporal, MD;  [facility];  Service: Endoscopy;  Laterality: N/A;  930   HEMICOLECTOMY Right    lap hand assisted   POLYPECTOMY  09/01/2019   Procedure: POLYPECTOMY;  Surgeon: Ruby Corporal, MD;  [facility];  Service: Endoscopy;;  colon   right shoulder surgery Right 03/2005   rotator cuff repair    Home Medications:  Allergies as of 03/09/2024   No Known Allergies      Medication List        Accurate as of Mar 08, 2024  6:41 PM. If you have any questions, ask your nurse or doctor.          Alprostadil  (Vasodilator) 20 MCG Solr Prostaglandin E1 20 mcg/mL Inject 1/2 to 1 mL as needed Number five 1 mL vials, as needed refill   amLODipine  10 MG tablet Commonly known as: NORVASC  TAKE ONE TABLET EVERY EVENING AT 8 PM FOR BLOOD PRESSURE   cholecalciferol 25 MCG (1000 UNIT) tablet Commonly known as: VITAMIN D3 Take 1,000 Units by mouth daily.   fluticasone  50 MCG/ACT nasal spray Commonly known as: FLONASE  SPRAY 2 SPRAYS INTO EACH NOSTRIL EVERY DAY   losartan  100 MG tablet Commonly known as: COZAAR  TAKE 1 TABLET BY MOUTH EVERY DAY   spironolactone  25 MG tablet Commonly known as: ALDACTONE  TAKE 1 TABLET (25 MG TOTAL) BY MOUTH DAILY.   tadalafil  20 MG tablet Commonly known as: CIALIS  Take 1 tablet (20 mg total) by mouth as needed for erectile dysfunction.        Allergies: No Known  Allergies  Family History  Problem Relation Age of Onset   Hypertension Mother    Cancer Father        colon    Hypertension Sister    Hypertension Brother    Diabetes Brother    Hypertension Daughter    Hypertension Son     Social History:  reports that he quit smoking about 51 years ago. His smoking use included cigarettes. He started smoking about 66 years ago. He has a 3.8 pack-year smoking history. He has been exposed to tobacco smoke. He has never used smokeless tobacco. He reports that he does not currently use alcohol. He reports that he does not use drugs.  ROS: A complete review of systems was performed.  All systems are negative except for pertinent findings as noted.  Physical Exam:  Vital signs in last 24 hours: There were no vitals taken for this visit. Constitutional:  Alert and oriented, No acute distress Cardiovascular: Regular rate  Respiratory: Normal respiratory effort Neurologic: Grossly intact, no focal deficits Psychiatric: Normal mood and affect  I talked the patient penile anatomy  Under my guidance, he injected 10 mcg in his left corporal body.  He had an appropriate erection without significant pain.  Injection technique was proper  I have reviewed prior  urine culture   Impression/Assessment:  ED, initiating IC injections  Plan:  I will have him come in in 2 months to review injections

## 2024-03-09 ENCOUNTER — Ambulatory Visit: Admitting: Urology

## 2024-03-09 ENCOUNTER — Encounter: Payer: Self-pay | Admitting: Urology

## 2024-03-09 VITALS — BP 159/80 | HR 57

## 2024-03-09 DIAGNOSIS — N5201 Erectile dysfunction due to arterial insufficiency: Secondary | ICD-10-CM | POA: Diagnosis not present

## 2024-03-09 LAB — MICROSCOPIC EXAMINATION: Bacteria, UA: NONE SEEN

## 2024-03-09 LAB — URINALYSIS, ROUTINE W REFLEX MICROSCOPIC
Bilirubin, UA: NEGATIVE
Glucose, UA: NEGATIVE
Ketones, UA: NEGATIVE
Leukocytes,UA: NEGATIVE
Nitrite, UA: NEGATIVE
Protein,UA: NEGATIVE
Specific Gravity, UA: <1.005 — ABNORMAL LOW (ref 1.005–1.030)
Urobilinogen, Ur: 0.2 mg/dL (ref 0.2–1.0)
pH, UA: 6.5 (ref 5.0–7.5)

## 2024-03-12 ENCOUNTER — Other Ambulatory Visit (INDEPENDENT_AMBULATORY_CARE_PROVIDER_SITE_OTHER): Payer: Self-pay | Admitting: *Deleted

## 2024-03-12 ENCOUNTER — Encounter (INDEPENDENT_AMBULATORY_CARE_PROVIDER_SITE_OTHER): Payer: Self-pay | Admitting: *Deleted

## 2024-03-12 DIAGNOSIS — Z8619 Personal history of other infectious and parasitic diseases: Secondary | ICD-10-CM

## 2024-03-12 DIAGNOSIS — K7400 Hepatic fibrosis, unspecified: Secondary | ICD-10-CM

## 2024-03-12 DIAGNOSIS — B182 Chronic viral hepatitis C: Secondary | ICD-10-CM

## 2024-03-19 ENCOUNTER — Ambulatory Visit (HOSPITAL_COMMUNITY)
Admission: RE | Admit: 2024-03-19 | Discharge: 2024-03-19 | Disposition: A | Discharge status: Home / Self Care | Source: Ambulatory Visit | Attending: Gastroenterology | Admitting: Gastroenterology

## 2024-03-19 DIAGNOSIS — B182 Chronic viral hepatitis C: Secondary | ICD-10-CM | POA: Diagnosis present

## 2024-03-19 LAB — AFP TUMOR MARKER: AFP, Serum, Tumor Marker: 4.5 ng/mL (ref 0.0–8.4)

## 2024-03-21 ENCOUNTER — Ambulatory Visit (INDEPENDENT_AMBULATORY_CARE_PROVIDER_SITE_OTHER): Payer: Self-pay | Admitting: Gastroenterology

## 2024-03-24 ENCOUNTER — Other Ambulatory Visit: Payer: Self-pay | Admitting: Family Medicine

## 2024-05-17 ENCOUNTER — Other Ambulatory Visit: Payer: Self-pay

## 2024-05-17 ENCOUNTER — Telehealth: Payer: Self-pay

## 2024-05-17 DIAGNOSIS — E785 Hyperlipidemia, unspecified: Secondary | ICD-10-CM

## 2024-05-17 DIAGNOSIS — I10 Essential (primary) hypertension: Secondary | ICD-10-CM

## 2024-05-17 DIAGNOSIS — E559 Vitamin D deficiency, unspecified: Secondary | ICD-10-CM

## 2024-05-17 NOTE — Telephone Encounter (Signed)
 Copied from CRM [id number]. Topic: General - Other >> May 17, 2024  8:38 AM Geroldine GRADE wrote: Reason for CRM: Patient would like a call because he is wondering if he should get labs before his physical on 7/18

## 2024-05-17 NOTE — Telephone Encounter (Signed)
 Ordered   Pt aware

## 2024-05-18 LAB — CMP14+EGFR
ALT: 21 IU/L (ref 0–44)
AST: 21 IU/L (ref 0–40)
Albumin: 4.2 g/dL (ref 3.8–4.8)
Alkaline Phosphatase: 54 IU/L (ref 44–121)
BUN/Creatinine Ratio: 8 — ABNORMAL LOW (ref 10–24)
BUN: 13 mg/dL (ref 8–27)
Bilirubin Total: 1.5 mg/dL — ABNORMAL HIGH (ref 0.0–1.2)
CO2: 23 mmol/L (ref 20–29)
Calcium: 9.4 mg/dL (ref 8.6–10.2)
Chloride: 100 mmol/L (ref 96–106)
Creatinine, Ser: 1.54 mg/dL — ABNORMAL HIGH (ref 0.76–1.27)
Globulin, Total: 2.6 g/dL (ref 1.5–4.5)
Glucose: 91 mg/dL (ref 70–99)
Potassium: 4.6 mmol/L (ref 3.5–5.2)
Sodium: 137 mmol/L (ref 134–144)
Total Protein: 6.8 g/dL (ref 6.0–8.5)
eGFR: 48 mL/min/1.73 — ABNORMAL LOW (ref >59)

## 2024-05-18 LAB — LIPID PANEL
Chol/HDL Ratio: 3.4 ratio (ref 0.0–5.0)
Cholesterol, Total: 199 mg/dL (ref 100–199)
HDL: 59 mg/dL (ref >39)
LDL Chol Calc (NIH): 129 mg/dL — ABNORMAL HIGH (ref 0–99)
Triglycerides: 61 mg/dL (ref 0–149)
VLDL Cholesterol Cal: 11 mg/dL (ref 5–40)

## 2024-05-18 LAB — TSH: TSH: 2.79 u[IU]/mL (ref 0.450–4.500)

## 2024-05-18 LAB — VITAMIN D 25 HYDROXY (VIT D DEFICIENCY, FRACTURES): Vit D, 25-Hydroxy: 16.2 ng/mL — ABNORMAL LOW (ref 30.0–100.0)

## 2024-05-21 ENCOUNTER — Ambulatory Visit (INDEPENDENT_AMBULATORY_CARE_PROVIDER_SITE_OTHER): Payer: Self-pay | Admitting: Family Medicine

## 2024-05-21 ENCOUNTER — Encounter: Payer: Self-pay | Admitting: Family Medicine

## 2024-05-21 VITALS — BP 128/80 | HR 58 | Resp 18 | Ht 73.0 in | Wt 175.1 lb

## 2024-05-21 DIAGNOSIS — R0989 Other specified symptoms and signs involving the circulatory and respiratory systems: Secondary | ICD-10-CM

## 2024-05-21 DIAGNOSIS — Z0001 Encounter for general adult medical examination with abnormal findings: Secondary | ICD-10-CM

## 2024-05-21 DIAGNOSIS — R42 Dizziness and giddiness: Secondary | ICD-10-CM

## 2024-05-21 DIAGNOSIS — D126 Benign neoplasm of colon, unspecified: Secondary | ICD-10-CM

## 2024-05-21 DIAGNOSIS — R3121 Asymptomatic microscopic hematuria: Secondary | ICD-10-CM

## 2024-05-21 DIAGNOSIS — R319 Hematuria, unspecified: Secondary | ICD-10-CM | POA: Insufficient documentation

## 2024-05-21 MED ORDER — VITAMIN D (ERGOCALCIFEROL) 1.25 MG (50000 UNIT) PO CAPS
50000.0000 [IU] | ORAL_CAPSULE | ORAL | 2 refills | Status: DC
Start: 2024-05-21 — End: 2024-08-19

## 2024-05-21 NOTE — Progress Notes (Signed)
   Bryce Martinez     MRN: [id number]      DOB: 1951-07-27  Chief Complaint  Patient presents with   Annual Exam    CPE    Dizziness    Complains of lightheadedness of and on for past couple weeks. Mainly happens when standing up, finds relief when sitting down     HPI: Patient is in for annual physical exam.  other health concerns are expressed and  addressed at the visit. States was recently told his urine was abnormal but is unclear what the issue is, has upcoming appt with Urology, record review shows hematuria, will rept Recent labs,  are reviewed. Immunization is reviewed , and  updated if needed.    PE; BP 128/80   Pulse (!) 58   Resp 18   Ht 6' 1 (1.854 m)   Wt 175 lb 1.3 oz (79.4 kg)   SpO2 98%   BMI 23.10 kg/m   Pleasant male, alert and oriented x 3, in no cardio-pulmonary distress. Afebrile. HEENT No facial trauma or asymetry. Sinuses non tender. EOMI External ears normal,  Neck: supple, no adenopathy,JVD or thyromegaly.No bruits.  Chest: Clear to ascultation bilaterally.No crackles or wheezes. Non tender to palpation  Cardiovascular system; Heart sounds normal,  S1 and  S2 ,no S3.  No murmur, or thrill. Apical beat not displaced Peripheral pulses normal.  Abdomen: Soft, non tender, no organomegaly or masses. No bruits. Bowel sounds normal. No guarding, tenderness or rebound.    Musculoskeletal exam: Full ROM of spine, hips , shoulders and knees. No deformity ,swelling or crepitus noted. No muscle wasting or atrophy.   Neurologic: Cranial nerves 2 to 12 intact. Power, tone ,sensation and reflexes normal throughout. No disturbance in gait. No tremor.  Skin: Intact, no ulceration, erythema , scaling or rash noted. Pigmentation normal throughout  Psych; Normal mood and affect. Judgement and concentration normal   Assessment & Plan:  Encounter for Medicare annual examination with abnormal findings Annual exam as  documented. Counseling done  re healthy lifestyle involving commitment to 150 minutes exercise per week, heart healthy diet, and attaining healthy weight.The importance of adequate sleep also discussed. Regular seat belt use and home safety, is also discussed. Changes in health habits are decided on by the patient with goals and time frames  set for achieving them. Immunization and cancer screening needs are specifically addressed at this visit.   Carotid bruit Reports light headeness x 6 month carotid doppler to assess  Light headedness 6 month h/o increased  episodes, no specific aggravating factor, not necessarily related to postural change or excessive heat. Refer Cardiology. Norecallof palpitations or chest pain  Hematuria Rept CCUA, normal 05/21/2024, already folllowed by Urology  Tubular adenoma of colon Refer for colonoscopy due 08/2024

## 2024-05-21 NOTE — Assessment & Plan Note (Signed)

## 2024-05-21 NOTE — Patient Instructions (Signed)
 Follow-up in 5 months, call if you need me sooner.  Clean-catch urinalysis with reflex culture in office today.  Blood pressure is good no change in medications.  I will refer you for an ultrasound of the blood flow to your brain due to lightheadedness for the last 6 months and also to see a cardiologist.  Please ensure that you keep well-hydrated and limit the amount of exposure to very hot temperatures.  Please start once weekly vitamin D  capsules as your level is low.  This will be continued for 9 months and we will recheck the level in the new year.  I will send a message regarding your colonoscopy which is  due in the Fall.   Thanks for choosing [facility], we consider it a privelige to serve you. '

## 2024-05-22 ENCOUNTER — Ambulatory Visit: Payer: Self-pay | Admitting: Family Medicine

## 2024-05-22 LAB — UA/M W/RFLX CULTURE, ROUTINE
Bilirubin, UA: NEGATIVE
Glucose, UA: NEGATIVE
Ketones, UA: NEGATIVE
Leukocytes,UA: NEGATIVE
Nitrite, UA: NEGATIVE
Protein,UA: NEGATIVE
RBC, UA: NEGATIVE
Specific Gravity, UA: 1.009 (ref 1.005–1.030)
Urobilinogen, Ur: 1 mg/dL (ref 0.2–1.0)
pH, UA: 6.5 (ref 5.0–7.5)

## 2024-05-22 LAB — MICROSCOPIC EXAMINATION
Bacteria, UA: NONE SEEN
Casts: NONE SEEN /LPF
Epithelial Cells (non renal): NONE SEEN /HPF (ref 0–10)
RBC, Urine: NONE SEEN /HPF (ref 0–2)
WBC, UA: NONE SEEN /HPF (ref 0–5)

## 2024-05-23 ENCOUNTER — Encounter: Payer: Self-pay | Admitting: Family Medicine

## 2024-05-23 NOTE — Assessment & Plan Note (Signed)
 Refer for colonoscopy due 08/2024

## 2024-05-23 NOTE — Assessment & Plan Note (Signed)
 Reports light headeness x 6 month carotid doppler to assess

## 2024-05-23 NOTE — Assessment & Plan Note (Signed)
 6 month h/o increased  episodes, no specific aggravating factor, not necessarily related to postural change or excessive heat. Refer Cardiology. Norecallof palpitations or chest pain

## 2024-05-23 NOTE — Assessment & Plan Note (Addendum)
 Rept CCUA, normal 05/21/2024, already folllowed by Urology

## 2024-05-24 NOTE — Progress Notes (Deleted)
 History of Present Illness: This man is here today for recheck.  He initiated IC injections approximately 2 months ago.  Past Medical History:  Diagnosis Date   Allergy    Arthritis    ED (erectile dysfunction)    GERD (gastroesophageal reflux disease)    Occasionally   Hepatitis C    Hyperlipidemia    Hypertension     Past Surgical History:  Procedure Laterality Date   APPENDECTOMY     CHOLECYSTECTOMY N/A 04/25/2014   Procedure: LAPAROSCOPIC CHOLECYSTECTOMY;  Surgeon: Oneil DELENA Budge, MD;  Location: [facility];  Service: General;  Laterality: N/A;   COLONOSCOPY N/A 09/01/2019   Procedure: COLONOSCOPY;  Surgeon: Golda Claudis PENNER, MD;  Location: [facility];  Service: Endoscopy;  Laterality: N/A;  930   HEMICOLECTOMY Right    lap hand assisted   POLYPECTOMY  09/01/2019   Procedure: POLYPECTOMY;  Surgeon: Golda Claudis PENNER, MD;  Location: [facility];  Service: Endoscopy;;  colon   right shoulder surgery Right 03/2005   rotator cuff repair    Home Medications:  Allergies as of 05/25/2024   No Known Allergies      Medication List        Accurate as of May 24, 2024  9:52 AM. If you have any questions, ask your nurse or doctor.          Alprostadil  (Vasodilator) 20 MCG Solr Prostaglandin E1 20 mcg/mL Inject 1/2 to 1 mL as needed Number five 1 mL vials, as needed refill   amLODipine  10 MG tablet Commonly known as: NORVASC  TAKE ONE TABLET EVERY EVENING AT 8 PM FOR BLOOD PRESSURE   cholecalciferol 25 MCG (1000 UNIT) tablet Commonly known as: VITAMIN D3 Take 1,000 Units by mouth daily.   fluticasone  50 MCG/ACT nasal spray Commonly known as: FLONASE  SPRAY 2 SPRAYS INTO EACH NOSTRIL EVERY DAY   losartan  100 MG tablet Commonly known as: COZAAR  TAKE 1 TABLET BY MOUTH EVERY DAY   spironolactone  25 MG tablet Commonly known as: ALDACTONE  TAKE 1 TABLET (25 MG TOTAL) BY MOUTH DAILY.   tadalafil  20 MG tablet Commonly known as: CIALIS  Take 1 tablet (20 mg total) by  mouth as needed for erectile dysfunction.   Vitamin D  (Ergocalciferol ) 1.25 MG (50000 UNIT) Caps capsule Commonly known as: DRISDOL  Take 1 capsule (50,000 Units total) by mouth every 7 (seven) days.        Allergies: No Known Allergies  Family History  Problem Relation Age of Onset   Hypertension Mother    Cancer Father        colon    Hypertension Sister    Hypertension Brother    Diabetes Brother    Drug abuse Brother    Hypertension Daughter    Hypertension Son     Social History:  reports that he quit smoking about 52 years ago. His smoking use included cigarettes. He started smoking about 67 years ago. He has a 4.4 pack-year smoking history. He has been exposed to tobacco smoke. He has never used smokeless tobacco. He reports that he does not currently use alcohol. He reports that he does not use drugs.  ROS: A complete review of systems was performed.  All systems are negative except for pertinent findings as noted.  Physical Exam:  Vital signs in last 24 hours: There were no vitals taken for this visit. Constitutional:  Alert and oriented, No acute distress Cardiovascular: Regular rate  Respiratory: Normal respiratory effort GI: Abdomen is soft, nontender,  nondistended, no abdominal masses. No CVAT.  Genitourinary: Normal male phallus, testes are descended bilaterally and non-tender and without masses, scrotum is normal in appearance without lesions or masses, perineum is normal on inspection. Lymphatic: No lymphadenopathy Neurologic: Grossly intact, no focal deficits Psychiatric: Normal mood and affect  I have reviewed prior pt notes  I have reviewed notes from referring/previous physicians  I have reviewed urinalysis results  I have independently reviewed prior imaging  I have reviewed prior PSA results  I have reviewed prior urine culture   Impression/Assessment:  ***  Plan:  ***

## 2024-05-25 ENCOUNTER — Ambulatory Visit: Admitting: Urology

## 2024-05-25 DIAGNOSIS — N5201 Erectile dysfunction due to arterial insufficiency: Secondary | ICD-10-CM

## 2024-06-24 ENCOUNTER — Other Ambulatory Visit: Payer: Self-pay | Admitting: Family Medicine

## 2024-06-29 ENCOUNTER — Ambulatory Visit (INDEPENDENT_AMBULATORY_CARE_PROVIDER_SITE_OTHER)

## 2024-06-29 VITALS — Ht 73.0 in | Wt 175.0 lb

## 2024-06-29 DIAGNOSIS — Z0000 Encounter for general adult medical examination without abnormal findings: Secondary | ICD-10-CM

## 2024-06-29 NOTE — Patient Instructions (Signed)
 Bryce Martinez , Thank you for taking time out of your busy schedule to complete your Annual Wellness Visit with me. I enjoyed our conversation and look forward to speaking with you again next year. I, as well as your care team,  appreciate your ongoing commitment to your health goals. Please review the following plan we discussed and let me know if I can assist you in the future.  Your Game plan/ To Do List   Follow up Visits: We will see or speak with you next year for your Next Medicare AWV with our clinical staff   Clinician Recommendations:  Aim for 30 minutes of exercise or brisk walking, 6-8 glasses of water , and 5 servings of fruits and vegetables each day.    Wishing you many blessings and good health during the next year until our next visit.  -Deloise Marchant   This is a list of the screenings recommended for you:  Health Maintenance  Topic Date Due   COVID-19 Vaccine (10 - 2024-25 season) 06/14/2024   Flu Shot  06/04/2024   Colon Cancer Screening  08/31/2024   DTaP/Tdap/Td vaccine (3 - Td or Tdap) 10/10/2024   Medicare Annual Wellness Visit  06/29/2025   Pneumococcal Vaccine for age over 39  Completed   Hepatitis C Screening  Completed   Zoster (Shingles) Vaccine  Completed   HPV Vaccine  Aged Out   Meningitis B Vaccine  Aged Out   Please bring us  a copy of the Advanced directives that we have provided previously so that we can make sure those are scanned into your chart.   Advanced directives: (Copy Requested) Please bring a copy of your health care power of attorney and living will to the office to be added to your chart at your convenience. You can mail to [facility] [address] [place], [place] [address] or email to [email or web address] Advance Care Planning is important because it:  [x]  Makes sure you receive the medical care that is consistent with your values, goals, and preferences  [x]  It provides guidance to your family and loved ones and  reduces their decisional burden about whether or not they are making the right decisions based on your wishes.  Follow the link provided in your after visit summary or read over the paperwork we have mailed to you to help you started getting your Advance Directives in place. If you need assistance in completing these, please reach out to us  so that we can help you!  See attachments for Preventive Care and Fall Prevention Tips.

## 2024-06-29 NOTE — Progress Notes (Signed)
 Subjective:   Bryce Martinez is a 73 y.o. who presents for a Medicare Wellness preventive visit.  As a reminder, Annual Wellness Visits don't include a physical exam, and some assessments may be limited, especially if this visit is performed virtually. We may recommend an in-person follow-up visit with your provider if needed.  Visit Complete: Virtual I connected with  Bryce Martinez on 06/29/24 by a video and audio enabled telemedicine application and verified that I am speaking with the correct person using two identifiers.  Patient Location: Home  Provider Location: Home Office  I discussed the limitations of evaluation and management by telemedicine. The patient expressed understanding and agreed to proceed.  Vital Signs: Because this visit was a virtual/telehealth visit, some criteria may be missing or patient reported. Any vitals not documented were not able to be obtained and vitals that have been documented are patient reported.  Persons Participating in Visit: Patient.  AWV Questionnaire: Yes: Patient Medicare AWV questionnaire was completed by the patient on 06/28/2024; I have confirmed that all information answered by patient is correct and no changes since this date.  Cardiac Risk Factors include: advanced age (>48men, >61 women);dyslipidemia;hypertension;male gender     Objective:    Today's Vitals   06/29/24 1021  Weight: 175 lb (79.4 kg)  Height: 6' 1 (1.854 m)   Body mass index is 23.09 kg/m.     06/29/2024   10:25 AM 11/18/2022    2:01 PM 08/20/2021   11:00 AM 08/17/2020    1:51 PM 09/01/2019    8:22 AM 08/03/2018    1:48 PM 07/10/2015    8:50 PM  Advanced Directives  Does Patient Have a Medical Advance Directive? No No No No No Yes  No   Does patient want to make changes to medical advance directive?      Yes (ED - Information included in AVS)    Would patient like information on creating a medical advance directive? Yes (MAU/Ambulatory/Procedural  Areas - Information given) Yes (Inpatient - patient requests chaplain consult to create a medical advance directive);Yes (Inpatient - patient defers creating a medical advance directive at this time - Information given);Yes (Inpatient - patient defers creating a medical advance directive and declines information at this time);Yes (ED - Information included in AVS);Yes (MAU/Ambulatory/Procedural Areas - Information given) No - Patient declined Yes (Inpatient - patient defers creating a medical advance directive and declines information at this time) No - Patient declined       Data saved with a previous flowsheet row definition    Current Medications (verified) Outpatient Encounter Medications as of 06/29/2024  Medication Sig   Alprostadil , Vasodilator, 20 MCG SOLR Prostaglandin E1 20 mcg/mL Inject 1/2 to 1 mL as needed Number five 1 mL vials, as needed refill   amLODipine  (NORVASC ) 10 MG tablet TAKE ONE TABLET EVERY EVENING AT 8 PM FOR BLOOD PRESSURE   cholecalciferol (VITAMIN D3) 25 MCG (1000 UNIT) tablet Take 1,000 Units by mouth daily.   fluticasone  (FLONASE ) 50 MCG/ACT nasal spray SPRAY 2 SPRAYS INTO EACH NOSTRIL EVERY DAY   losartan  (COZAAR ) 100 MG tablet TAKE 1 TABLET BY MOUTH EVERY DAY   spironolactone  (ALDACTONE ) 25 MG tablet TAKE 1 TABLET (25 MG TOTAL) BY MOUTH DAILY.   tadalafil  (CIALIS ) 20 MG tablet Take 1 tablet (20 mg total) by mouth as needed for erectile dysfunction.   Vitamin D , Ergocalciferol , (DRISDOL ) 1.25 MG (50000 UNIT) CAPS capsule Take 1 capsule (50,000 Units total) by mouth every 7 (  seven) days.   No facility-administered encounter medications on file as of 06/29/2024.    Allergies (verified) Patient has no known allergies.   History: Past Medical History:  Diagnosis Date   Allergy    Arthritis    ED (erectile dysfunction)    GERD (gastroesophageal reflux disease)    Occasionally   Hepatitis C    Hyperlipidemia    Hypertension    Past Surgical History:   Procedure Laterality Date   APPENDECTOMY     CHOLECYSTECTOMY N/A 04/25/2014   Procedure: LAPAROSCOPIC CHOLECYSTECTOMY;  Surgeon: Oneil DELENA Budge, MD;  Location: [facility];  Service: General;  Laterality: N/A;   COLONOSCOPY N/A 09/01/2019   Procedure: COLONOSCOPY;  Surgeon: Golda Claudis PENNER, MD;  Location: [facility];  Service: Endoscopy;  Laterality: N/A;  930   HEMICOLECTOMY Right    lap hand assisted   POLYPECTOMY  09/01/2019   Procedure: POLYPECTOMY;  Surgeon: Golda Claudis PENNER, MD;  Location: [facility];  Service: Endoscopy;;  colon   right shoulder surgery Right 03/2005   rotator cuff repair   Family History  Problem Relation Age of Onset   Hypertension Mother    Cancer Father        colon    Hypertension Sister    Hypertension Brother    Diabetes Brother    Drug abuse Brother    Hypertension Daughter    Hypertension Son    Social History   Socioeconomic History   Marital status: Married    Spouse name: Not on file   Number of children: Not on file   Years of education: Not on file   Highest education level: Associate degree: occupational, Scientist, product/process development, or vocational program  Occupational History   Not on file  Tobacco Use   Smoking status: Former    Current packs/day: 0.00    Average packs/day: 0.3 packs/day for 17.3 years (4.4 ttl pk-yrs)    Types: Cigarettes    Start date: 04/19/1957    Quit date: 04/19/1972    Years since quitting: 52.2    Passive exposure: Past   Smokeless tobacco: Never  Vaping Use   Vaping status: Never Used  Substance and Sexual Activity   Alcohol use: Not Currently    Comment: special occasions   Drug use: No   Sexual activity: Not Currently    Birth control/protection: None  Other Topics Concern   Not on file  Social History Narrative   Not on file   Social Drivers of Health   Financial Resource Strain: Low Risk  (06/28/2024)   Overall Financial Resource Strain (CARDIA)    Difficulty of Paying Living Expenses: Not hard at all   Food Insecurity: No Food Insecurity (06/28/2024)   Hunger Vital Sign    Worried About Running Out of Food in the Last Year: Never true    Ran Out of Food in the Last Year: Never true  Transportation Needs: No Transportation Needs (06/28/2024)   PRAPARE - Administrator, Civil Service (Medical): No    Lack of Transportation (Non-Medical): No  Physical Activity: Insufficiently Active (06/28/2024)   Exercise Vital Sign    Days of Exercise per Week: 3 days    Minutes of Exercise per Session: 20 min  Stress: No Stress Concern Present (06/28/2024)   [employer] of Occupational Health - Occupational Stress Questionnaire    Feeling of Stress: Only a little  Social Connections: Moderately Integrated (06/28/2024)   Social Connection and Isolation Panel  Frequency of Communication with Friends and Family: Once a week    Frequency of Social Gatherings with Friends and Family: Twice a week    Attends Religious Services: More than 4 times per year    Active Member of [employer] or Organizations: No    Attends Engineer, structural: Never    Marital Status: Married    Tobacco Counseling Counseling given: Yes    Clinical Intake:  Pre-visit preparation completed: Yes  Pain : No/denies pain     BMI - recorded: 23.09 Nutritional Status: BMI of 19-24  Normal Nutritional Risks: None Diabetes: No  Lab Results  Component Value Date   HGBA1C 5.5 09/16/2017   HGBA1C 5.8 (H) 05/20/2015   HGBA1C 5.7 (H) 10/22/2014     How often do you need to have someone help you when you read instructions, pamphlets, or other written materials from your doctor or pharmacy?: 1 - Never  Interpreter Needed?: No  Information entered by :: Leea Rambeau W CMA (AAMA)   Activities of Daily Living     06/29/2024   10:25 AM 06/28/2024    5:25 PM  In your present state of health, do you have any difficulty performing the following activities:  Hearing? 0 0  Vision? 0 0  Difficulty concentrating  or making decisions? 0 0  Walking or climbing stairs? 0 0  Dressing or bathing? 0 0  Doing errands, shopping? 0 0  Preparing Food and eating ? N N  Using the Toilet? N N  In the past six months, have you accidently leaked urine? N N  Do you have problems with loss of bowel control? N N  Managing your Medications? N N  Managing your Finances? N N  Housekeeping or managing your Housekeeping? N N    Patient Care Team: Antonetta Rollene BRAVO, MD as PCP - General Darroll Anes, DO (Optometry) Mariette Mitzie CROME, NP as Nurse Practitioner (Gastroenterology) Matilda Senior, MD as Consulting Physician (Urology) [facility]) Eartha Flavors, Toribio, MD as Consulting Physician (Gastroenterology)  I have updated your Care Teams any recent Medical Services you may have received from other providers in the past year.     Assessment:   This is a routine wellness examination for Bryce Martinez.  Hearing/Vision screen Hearing Screening - Comments:: Patient denies any hearing difficulties.   Vision Screening - Comments:: Wears rx glasses - up to date with routine eye exams with  Anes Darroll at [facility]    Goals Addressed               This Visit's Progress     Exercise 3x per week (30 min per time)   On track     Exercise routine      Patient Stated (pt-stated)        I want to join the gym again.        Depression Screen     06/29/2024   10:31 AM 05/21/2024    3:34 PM 10/22/2023    2:51 PM 05/20/2023    1:06 PM 11/19/2022    1:29 PM 11/18/2022    1:59 PM 10/09/2022    3:01 PM  PHQ 2/9 Scores  PHQ - 2 Score 0 0 0 0 0 0 0  PHQ- 9 Score 0 2   0 0 0    Fall Risk     06/28/2024    5:25 PM 05/21/2024    3:34 PM 12/16/2023    6:06 PM 10/22/2023  2:50 PM 05/20/2023    1:06 PM  Fall Risk   Falls in the past year? 0 0 0 0 0  Number falls in past yr: 0 0  0 0  Injury with Fall? 0 0  0 0  Risk for fall due to : No Fall Risks   No Fall  Risks No Fall Risks  Follow up Falls evaluation completed;Education provided;Falls prevention discussed Falls evaluation completed  Falls evaluation completed Falls evaluation completed    MEDICARE RISK AT HOME:  Medicare Risk at Home Any stairs in or around the home?: (Patient-Rptd) Yes If so, are there any without handrails?: (Patient-Rptd) No Home free of loose throw rugs in walkways, pet beds, electrical cords, etc?: (Patient-Rptd) Yes Adequate lighting in your home to reduce risk of falls?: (Patient-Rptd) Yes Life alert?: (Patient-Rptd) No Use of a cane, walker or w/c?: (Patient-Rptd) No Grab bars in the bathroom?: (Patient-Rptd) No Shower chair or bench in shower?: (Patient-Rptd) No Elevated toilet seat or a handicapped toilet?: (Patient-Rptd) No  TIMED UP AND GO:  Was the test performed?  No  Cognitive Function: 6CIT completed        06/29/2024   10:30 AM 08/20/2021   11:02 AM 08/17/2020    1:53 PM 08/05/2019   11:10 AM 08/03/2018    1:48 PM  6CIT Screen  What Year? 0 points 0 points 0 points 0 points   What month? 0 points 0 points 0 points 0 points   What time? 0 points 0 points 0 points 0 points 0 points  Count back from 20 0 points 0 points 0 points 0 points 0 points  Months in reverse 0 points 0 points 0 points 0 points 0 points  Repeat phrase 0 points 0 points 0 points 0 points 0 points  Total Score 0 points 0 points 0 points 0 points     Immunizations Immunization History  Administered Date(s) Administered    sv, Bivalent, Protein Subunit Rsvpref,pf (Abrysvo) 02/04/2023   Fluad Quad(high Dose 65+) 07/19/2019, 08/02/2020, 06/28/2021, 07/12/2022   INFLUENZA, HIGH DOSE SEASONAL PF 08/06/2023   Influenza,inj,Quad PF,6+ Mos 08/16/2014, 08/24/2015, 07/24/2016, 08/03/2018   Influenza-Unspecified 08/04/2021, 07/24/2022   Moderna Covid-19 Fall Seasonal Vaccine 81yrs & older 07/28/2022   PFIZER Comirnaty(Gray Top)Covid-19 Tri-Sucrose Vaccine 03/02/2021    PFIZER(Purple Top)SARS-COV-2 Vaccination 11/24/2019, 12/25/2019, 07/31/2020, 08/23/2020, 09/11/2021   Pfizer Covid-19 Vaccine Bivalent Booster 14yrs & up 09/11/2021   Pneumococcal Conjugate-13 09/22/2017   Pneumococcal Polysaccharide-23 11/16/2018   Td 06/14/2004   Tdap 10/10/2014   Zoster Recombinant(Shingrix) 01/03/2021, 10/03/2021   Zoster, Live 07/22/2012    Screening Tests Health Maintenance  Topic Date Due   COVID-19 Vaccine (10 - 2024-25 season) 06/14/2024   INFLUENZA VACCINE  06/04/2024   Colonoscopy  08/31/2024   DTaP/Tdap/Td (3 - Td or Tdap) 10/10/2024   Medicare Annual Wellness (AWV)  06/29/2025   Pneumococcal Vaccine: 50+ Years  Completed   Hepatitis C Screening  Completed   Zoster Vaccines- Shingrix  Completed   HPV VACCINES  Aged Out   Meningococcal B Vaccine  Aged Out    Health Maintenance  Health Maintenance Due  Topic Date Due   COVID-19 Vaccine (10 - 2024-25 season) 06/14/2024   INFLUENZA VACCINE  06/04/2024   Colonoscopy  08/31/2024   Health Maintenance Items Addressed: Routine health screenings are up to date. Patient is aware of recommended vaccines.   Additional Screening:  Vision Screening: Recommended annual ophthalmology exams for early detection of glaucoma and other disorders of the  eye. Would you like a referral to an eye doctor? No    Dental Screening: Recommended annual dental exams for proper oral hygiene  Community Resource Referral / Chronic Care Management: CRR required this visit?  No   CCM required this visit?  No   Plan:    I have personally reviewed and noted the following in the patient's chart:   Medical and social history Use of alcohol, tobacco or illicit drugs  Current medications and supplements including opioid prescriptions. Patient is not currently taking opioid prescriptions. Functional ability and status Nutritional status Physical activity Advanced directives List of other physicians Hospitalizations,  surgeries, and ER visits in previous 12 months Vitals Screenings to include cognitive, depression, and falls Referrals and appointments  In addition, I have reviewed and discussed with patient certain preventive protocols, quality metrics, and best practice recommendations. A written personalized care plan for preventive services as well as general preventive health recommendations were provided to patient.   [place] Droll, CMA   06/29/2024   After Visit Summary: (MyChart) Due to this being a telephonic visit, the after visit summary with patients personalized plan was offered to patient via MyChart   Notes: Nothing significant to report at this time.

## 2024-08-05 ENCOUNTER — Other Ambulatory Visit (INDEPENDENT_AMBULATORY_CARE_PROVIDER_SITE_OTHER): Payer: Self-pay

## 2024-08-05 ENCOUNTER — Telehealth (INDEPENDENT_AMBULATORY_CARE_PROVIDER_SITE_OTHER): Payer: Self-pay

## 2024-08-05 DIAGNOSIS — Z8619 Personal history of other infectious and parasitic diseases: Secondary | ICD-10-CM

## 2024-08-05 NOTE — Telephone Encounter (Signed)
 Patient left vm asking about his colonoscopy recall and ultrasound recall. I told the patient that he will receive a questionnaire to fill out in the mail for the colonoscopy and I scheduled his ultrasound for 09/20/2024 at 9:30. Patient is aware and verbalized understanding.

## 2024-08-11 ENCOUNTER — Encounter (INDEPENDENT_AMBULATORY_CARE_PROVIDER_SITE_OTHER): Payer: Self-pay | Admitting: *Deleted

## 2024-08-13 ENCOUNTER — Other Ambulatory Visit (INDEPENDENT_AMBULATORY_CARE_PROVIDER_SITE_OTHER): Payer: Self-pay

## 2024-08-13 DIAGNOSIS — Z8619 Personal history of other infectious and parasitic diseases: Secondary | ICD-10-CM

## 2024-08-13 DIAGNOSIS — K7400 Hepatic fibrosis, unspecified: Secondary | ICD-10-CM

## 2024-08-17 ENCOUNTER — Telehealth: Payer: Self-pay

## 2024-08-17 ENCOUNTER — Other Ambulatory Visit: Payer: Self-pay

## 2024-08-17 DIAGNOSIS — Z1211 Encounter for screening for malignant neoplasm of colon: Secondary | ICD-10-CM

## 2024-08-17 NOTE — Telephone Encounter (Signed)
 Referral sent to GI

## 2024-08-17 NOTE — Telephone Encounter (Signed)
 Copied from CRM [id number]. Topic: Referral - Request for Referral >> Aug 17, 2024  3:31 PM Tiffini S wrote: Did the patient discuss referral with their provider in the last year? Yes (If No - schedule appointment) (If Yes - send message)  Appointment offered? Yes  Type of order/referral and detailed reason for visit: Colonoscopy  Preference of office, provider, location: [facility]  If referral order, have you been seen by this specialty before? Yes, five years ago (If Yes, this issue or another issue? When? Where?  Can we respond through MyChart? Yes

## 2024-08-18 ENCOUNTER — Encounter (INDEPENDENT_AMBULATORY_CARE_PROVIDER_SITE_OTHER): Payer: Self-pay | Admitting: Gastroenterology

## 2024-08-19 ENCOUNTER — Telehealth (INDEPENDENT_AMBULATORY_CARE_PROVIDER_SITE_OTHER): Payer: Self-pay

## 2024-08-19 ENCOUNTER — Encounter (INDEPENDENT_AMBULATORY_CARE_PROVIDER_SITE_OTHER): Payer: Self-pay

## 2024-08-19 MED ORDER — PEG 3350-KCL-NA BICARB-NACL 420 G PO SOLR: 4000.0000 mL | Freq: Once | ORAL | 0 refills | Status: AC

## 2024-08-19 NOTE — Addendum Note (Signed)
 Addended by: DALLIE LIONEL RAMAN on: 08/19/2024 10:12 AM   Modules accepted: Orders

## 2024-08-19 NOTE — Telephone Encounter (Signed)
 ATC patient, spoke with wife and she stated that she does not know his schedule but that she would get the pt to call back.

## 2024-08-19 NOTE — Telephone Encounter (Signed)
 Any room Thanks

## 2024-08-19 NOTE — Telephone Encounter (Signed)
 Who is your primary care physician: Dr. Rollene Pesa  Reasons for the colonoscopy: history of colon polyps   Have you had a colonoscopy before?  yes  Do you have family history of colon cancer? Yes, father  Previous colonoscopy with polyps removed? Yes, 5 years ago  Do you have a history colorectal cancer?   no  Are you diabetic? If yes, Type 1 or Type 2?    no  Do you have a prosthetic or mechanical heart valve? no  Do you have a pacemaker/defibrillator?   no  Have you had endocarditis/atrial fibrillation? no  Have you had joint replacement within the last 12 months?  no  Do you tend to be constipated or have to use laxatives? no  Do you have any history of drugs or alchohol?  no  Do you use supplemental oxygen?  no  Have you had a stroke or heart attack within the last 6 months? no  Do you take weight loss medication?  no  Do you take any blood-thinning medications such as: (aspirin , warfarin, Plavix, Aggrenox)  no  If yes we need the name, milligram, dosage and who is prescribing doctor  Current Outpatient Medications on File Prior to Visit  Medication Sig Dispense Refill   losartan  (COZAAR ) 100 MG tablet TAKE 1 TABLET BY MOUTH EVERY DAY 90 tablet 1   spironolactone  (ALDACTONE ) 25 MG tablet TAKE 1 TABLET (25 MG TOTAL) BY MOUTH DAILY. 90 tablet 3   No current facility-administered medications on file prior to visit.    No Known Allergies   Pharmacy: [facility]  Primary Insurance Name: [employer] number where you can be reached: [phone] or [phone]

## 2024-08-19 NOTE — Telephone Encounter (Signed)
 Spoke with patient, scheduled colonoscopy for 09/27/2024 at 7:30am. Rx sent to pharmacy. Instructions sent through mychart.

## 2024-08-20 ENCOUNTER — Telehealth: Payer: Self-pay

## 2024-08-20 ENCOUNTER — Telehealth: Payer: Self-pay | Admitting: Family Medicine

## 2024-08-20 ENCOUNTER — Ambulatory Visit

## 2024-08-20 VITALS — BP 200/88

## 2024-08-20 DIAGNOSIS — I10 Essential (primary) hypertension: Secondary | ICD-10-CM

## 2024-08-20 NOTE — Telephone Encounter (Signed)
 Copied from CRM #[id number]. Topic: Clinical - Medical Advice >> Aug 20, 2024  2:33 PM Harlene ORN wrote: Reason for CRM: [place] - Nurse from [facility]  called in and said that his PCP changed his BP medication recently. his BP has elevated since then. Nurse called attempting to reach out to [facility] to discuss directly. Please call back the patient directly to discuss. Darold Minerva: [phone] [place] - Nurse from [place]: [phone]

## 2024-08-20 NOTE — Telephone Encounter (Signed)
  error

## 2024-08-20 NOTE — Progress Notes (Signed)
 Patient is in office today for a nurse visit for Blood Pressure Check. Patient blood pressure was 200/88, Patient No chest pain or SOB. Advised patient to visit the ER if he developed concerning symptoms such as chest pain, sob, dizziness or lightheadedness or swelling before his appt Tuesday.

## 2024-08-23 NOTE — Telephone Encounter (Signed)
Patient scheduled 10/21 with Dr.Patel.

## 2024-08-23 NOTE — Telephone Encounter (Signed)
 Questionnaire from recall, no referral needed

## 2024-08-24 ENCOUNTER — Encounter: Payer: Self-pay | Admitting: Internal Medicine

## 2024-08-24 ENCOUNTER — Ambulatory Visit: Admitting: Internal Medicine

## 2024-08-24 VITALS — BP 130/71 | HR 66 | Ht 73.0 in | Wt 174.6 lb

## 2024-08-24 DIAGNOSIS — I10 Essential (primary) hypertension: Secondary | ICD-10-CM

## 2024-08-24 DIAGNOSIS — R42 Dizziness and giddiness: Secondary | ICD-10-CM | POA: Diagnosis not present

## 2024-08-24 MED ORDER — MECLIZINE HCL 25 MG PO TABS
25.0000 mg | ORAL_TABLET | Freq: Two times a day (BID) | ORAL | 0 refills | Status: AC | PRN
Start: 2024-08-24 — End: ?

## 2024-08-24 MED ORDER — MISC. DEVICES MISC: 0 refills | Status: DC

## 2024-08-24 NOTE — Patient Instructions (Signed)
Please continue to take medications as prescribed.  Please continue to follow low salt diet and perform moderate exercise/walking as tolerated.  Please get fasting blood tests done before the next visit.

## 2024-08-24 NOTE — Progress Notes (Unsigned)
 Acute Office Visit  Subjective:    Patient ID: Bryce Martinez, male    DOB: 1951/02/02, 73 y.o.   MRN: [id number]  Chief Complaint  Patient presents with   Hypertension    Pt states his bp has been running high. Thinks he may need a new machine.     HPI Patient is in today for f/u of HTN. He had elevated BP at home, around 150s/80s. Denies headache, chest pain, dyspnea or palpitations.  Past Medical History:  Diagnosis Date   Allergy    Arthritis    ED (erectile dysfunction)    GERD (gastroesophageal reflux disease)    Occasionally   Hepatitis C    Hyperlipidemia    Hypertension     Past Surgical History:  Procedure Laterality Date   APPENDECTOMY     CHOLECYSTECTOMY N/A 04/25/2014   Procedure: LAPAROSCOPIC CHOLECYSTECTOMY;  Surgeon: Oneil DELENA Budge, MD;  Location: [facility];  Service: General;  Laterality: N/A;   COLONOSCOPY N/A 09/01/2019   Procedure: COLONOSCOPY;  Surgeon: Golda Claudis PENNER, MD;  [facility];  Service: Endoscopy;  Laterality: N/A;  930   HEMICOLECTOMY Right    lap hand assisted   POLYPECTOMY  09/01/2019   Procedure: POLYPECTOMY;  Surgeon: Golda Claudis PENNER, MD;  [facility];  Service: Endoscopy;;  colon   right shoulder surgery Right 03/2005   rotator cuff repair    Family History  Problem Relation Age of Onset   Hypertension Mother    Cancer Father        colon    Hypertension Sister    Hypertension Brother    Diabetes Brother    Drug abuse Brother    Hypertension Daughter    Hypertension Son     Social History   Socioeconomic History   Marital status: Married    Spouse name: Not on file   Number of children: Not on file   Years of education: Not on file   Highest education level: Associate degree: occupational, Scientist, product/process development, or vocational program  Occupational History   Not on file  Tobacco Use   Smoking status: Former    Current packs/day: 0.00    Average packs/day: 0.3 packs/day for 17.3 years (4.4 ttl  pk-yrs)    Types: Cigarettes    Start date: 04/19/1957    Quit date: 04/19/1972    Years since quitting: 52.3    Passive exposure: Past   Smokeless tobacco: Never  Vaping Use   Vaping status: Never Used  Substance and Sexual Activity   Alcohol use: Not Currently    Comment: special occasions   Drug use: No   Sexual activity: Not Currently    Birth control/protection: None  Other Topics Concern   Not on file  Social History Narrative   Not on file   Social Drivers of Health   Financial Resource Strain: Low Risk  (06/28/2024)   Overall Financial Resource Strain (CARDIA)    Difficulty of Paying Living Expenses: Not hard at all  Food Insecurity: No Food Insecurity (06/28/2024)   Hunger Vital Sign    Worried About Running Out of Food in the Last Year: Never true    Ran Out of Food in the Last Year: Never true  Transportation Needs: No Transportation Needs (06/28/2024)   PRAPARE - Administrator, Civil Service (Medical): No    Lack of Transportation (Non-Medical): No  Physical Activity: Insufficiently Active (06/28/2024)   Exercise Vital Sign  Days of Exercise per Week: 3 days    Minutes of Exercise per Session: 20 min  Stress: No Stress Concern Present (06/28/2024)   [employer] of Occupational Health - Occupational Stress Questionnaire    Feeling of Stress: Only a little  Social Connections: Moderately Integrated (06/28/2024)   Social Connection and Isolation Panel    Frequency of Communication with Friends and Family: Once a week    Frequency of Social Gatherings with Friends and Family: Twice a week    Attends Religious Services: More than 4 times per year    Active Member of [employer] or Organizations: No    Attends Banker Meetings: Never    Marital Status: Married  Catering manager Violence: Not At Risk (06/29/2024)   Humiliation, Afraid, Rape, and Kick questionnaire    Fear of Current or Ex-Partner: No    Emotionally Abused: No    Physically  Abused: No    Sexually Abused: No    Outpatient Medications Prior to Visit  Medication Sig Dispense Refill   losartan  (COZAAR ) 100 MG tablet TAKE 1 TABLET BY MOUTH EVERY DAY 90 tablet 1   spironolactone  (ALDACTONE ) 25 MG tablet TAKE 1 TABLET (25 MG TOTAL) BY MOUTH DAILY. 90 tablet 3   No facility-administered medications prior to visit.    No Known Allergies  Review of Systems     Objective:    Physical Exam  BP 130/71   Pulse 66   Ht 6' 1 (1.854 m)   Wt 174 lb 9.6 oz (79.2 kg)   SpO2 99%   BMI 23.04 kg/m  Wt Readings from Last 3 Encounters:  08/24/24 174 lb 9.6 oz (79.2 kg)  06/29/24 175 lb (79.4 kg)  05/21/24 175 lb 1.3 oz (79.4 kg)        Assessment & Plan:   Problem List Items Addressed This Visit   None    No orders of the defined types were placed in this encounter.    Suzzane MARLA Blanch, MD

## 2024-08-27 NOTE — Assessment & Plan Note (Signed)
 Dizziness/room spinning sensation could be due to vertigo, now improved Advised to take meclizine as needed if he has recurrent episode of dizziness Avoid sudden positional changes Maintain adequate hydration and eat at regular intervals

## 2024-08-27 NOTE — Assessment & Plan Note (Signed)
 BP Readings from Last 1 Encounters:  08/24/24 130/71   Well-controlled with losartan  100 mg QD and spironolactone  25 mg QD Counseled for compliance with the medications Advised DASH diet and moderate exercise/walking, at least 150 mins/week  Since his BP is WNL today, would avoid changing his regimen Advised to get a new blood pressure device, prescription provided

## 2024-09-17 LAB — AFP TUMOR MARKER: AFP, Serum, Tumor Marker: 4.2 ng/mL (ref 0.0–8.4)

## 2024-09-18 ENCOUNTER — Ambulatory Visit (INDEPENDENT_AMBULATORY_CARE_PROVIDER_SITE_OTHER): Payer: Self-pay | Admitting: Gastroenterology

## 2024-09-20 ENCOUNTER — Ambulatory Visit (HOSPITAL_COMMUNITY)
Admission: RE | Admit: 2024-09-20 | Discharge: 2024-09-20 | Disposition: A | Discharge status: Home / Self Care | Source: Ambulatory Visit | Attending: Gastroenterology | Admitting: Gastroenterology

## 2024-09-20 ENCOUNTER — Ambulatory Visit (INDEPENDENT_AMBULATORY_CARE_PROVIDER_SITE_OTHER): Payer: Self-pay | Admitting: Gastroenterology

## 2024-09-20 DIAGNOSIS — Z8619 Personal history of other infectious and parasitic diseases: Secondary | ICD-10-CM | POA: Insufficient documentation

## 2024-09-20 NOTE — Progress Notes (Signed)
 6 mth US  noted in recall

## 2024-09-20 NOTE — Progress Notes (Signed)
 Noted to have AFP drawn at [facility] around 03/18/2024.

## 2024-09-27 ENCOUNTER — Inpatient Hospital Stay (HOSPITAL_COMMUNITY)

## 2024-09-27 ENCOUNTER — Ambulatory Visit (HOSPITAL_COMMUNITY): Payer: Self-pay | Admitting: Anesthesiology

## 2024-09-27 ENCOUNTER — Ambulatory Visit: Payer: Self-pay

## 2024-09-27 ENCOUNTER — Encounter (HOSPITAL_COMMUNITY)
Admission: RE | Disposition: A | Discharge status: Home / Self Care | Payer: Self-pay | Source: Home / Self Care | Attending: Gastroenterology

## 2024-09-27 ENCOUNTER — Other Ambulatory Visit: Payer: Self-pay

## 2024-09-27 ENCOUNTER — Ambulatory Visit (HOSPITAL_BASED_OUTPATIENT_CLINIC_OR_DEPARTMENT_OTHER): Payer: Self-pay | Admitting: Anesthesiology

## 2024-09-27 ENCOUNTER — Emergency Department (HOSPITAL_COMMUNITY)

## 2024-09-27 ENCOUNTER — Encounter (HOSPITAL_COMMUNITY): Payer: Self-pay

## 2024-09-27 ENCOUNTER — Ambulatory Visit (HOSPITAL_COMMUNITY)
Admission: RE | Admit: 2024-09-27 | Discharge: 2024-09-27 | Disposition: A | Discharge status: Home / Self Care | Attending: Gastroenterology | Admitting: Gastroenterology

## 2024-09-27 ENCOUNTER — Encounter (HOSPITAL_COMMUNITY): Payer: Self-pay | Admitting: Gastroenterology

## 2024-09-27 ENCOUNTER — Inpatient Hospital Stay (HOSPITAL_COMMUNITY)
Admission: EM | Admit: 2024-09-27 | Discharge: 2024-10-12 | DRG: 207 | Disposition: A | Discharge status: Rehabilitation Facility | Attending: Internal Medicine | Admitting: Internal Medicine

## 2024-09-27 DIAGNOSIS — R6521 Severe sepsis with septic shock: Secondary | ICD-10-CM | POA: Diagnosis not present

## 2024-09-27 DIAGNOSIS — J80 Acute respiratory distress syndrome: Principal | ICD-10-CM

## 2024-09-27 DIAGNOSIS — A419 Sepsis, unspecified organism: Secondary | ICD-10-CM | POA: Diagnosis not present

## 2024-09-27 DIAGNOSIS — Z980 Intestinal bypass and anastomosis status: Secondary | ICD-10-CM | POA: Insufficient documentation

## 2024-09-27 DIAGNOSIS — J9601 Acute respiratory failure with hypoxia: Secondary | ICD-10-CM | POA: Diagnosis not present

## 2024-09-27 DIAGNOSIS — K648 Other hemorrhoids: Secondary | ICD-10-CM | POA: Insufficient documentation

## 2024-09-27 DIAGNOSIS — I10 Essential (primary) hypertension: Secondary | ICD-10-CM | POA: Insufficient documentation

## 2024-09-27 DIAGNOSIS — G7281 Critical illness myopathy: Secondary | ICD-10-CM | POA: Diagnosis not present

## 2024-09-27 DIAGNOSIS — D123 Benign neoplasm of transverse colon: Secondary | ICD-10-CM

## 2024-09-27 DIAGNOSIS — R571 Hypovolemic shock: Secondary | ICD-10-CM

## 2024-09-27 DIAGNOSIS — E8721 Acute metabolic acidosis: Secondary | ICD-10-CM

## 2024-09-27 DIAGNOSIS — Z1211 Encounter for screening for malignant neoplasm of colon: Secondary | ICD-10-CM

## 2024-09-27 DIAGNOSIS — K623 Rectal prolapse: Secondary | ICD-10-CM | POA: Insufficient documentation

## 2024-09-27 DIAGNOSIS — K219 Gastro-esophageal reflux disease without esophagitis: Secondary | ICD-10-CM | POA: Insufficient documentation

## 2024-09-27 DIAGNOSIS — Z87891 Personal history of nicotine dependence: Secondary | ICD-10-CM

## 2024-09-27 DIAGNOSIS — G709 Myoneural disorder, unspecified: Secondary | ICD-10-CM | POA: Insufficient documentation

## 2024-09-27 DIAGNOSIS — I5021 Acute systolic (congestive) heart failure: Secondary | ICD-10-CM | POA: Diagnosis not present

## 2024-09-27 DIAGNOSIS — Z860101 Personal history of adenomatous and serrated colon polyps: Secondary | ICD-10-CM

## 2024-09-27 DIAGNOSIS — Z8619 Personal history of other infectious and parasitic diseases: Secondary | ICD-10-CM | POA: Insufficient documentation

## 2024-09-27 DIAGNOSIS — U071 COVID-19: Secondary | ICD-10-CM | POA: Diagnosis not present

## 2024-09-27 DIAGNOSIS — E119 Type 2 diabetes mellitus without complications: Secondary | ICD-10-CM | POA: Diagnosis not present

## 2024-09-27 DIAGNOSIS — E785 Hyperlipidemia, unspecified: Secondary | ICD-10-CM | POA: Insufficient documentation

## 2024-09-27 HISTORY — PX: COLONOSCOPY: SHX5424

## 2024-09-27 LAB — CBC WITH DIFFERENTIAL/PLATELET
Abs Immature Granulocytes: 0.04 K/uL (ref 0.00–0.07)
Basophils Absolute: 0.1 K/uL (ref 0.0–0.1)
Basophils Relative: 1 %
Eosinophils Absolute: 0 K/uL (ref 0.0–0.5)
Eosinophils Relative: 0 %
HCT: 48 % (ref 39.0–52.0)
Hemoglobin: 15.3 g/dL (ref 13.0–17.0)
Immature Granulocytes: 1 %
Lymphocytes Relative: 22 %
Lymphs Abs: 1.1 K/uL (ref 0.7–4.0)
MCH: 27.9 pg (ref 26.0–34.0)
MCHC: 31.9 g/dL (ref 30.0–36.0)
MCV: 87.6 fL (ref 80.0–100.0)
Monocytes Absolute: 0.1 K/uL (ref 0.1–1.0)
Monocytes Relative: 1 %
Neutro Abs: 3.7 K/uL (ref 1.7–7.7)
Neutrophils Relative %: 75 %
Platelets: 275 K/uL (ref 150–400)
RBC: 5.48 MIL/uL (ref 4.22–5.81)
RDW: 13.2 % (ref 11.5–15.5)
Smear Review: NORMAL
WBC: 4.9 K/uL (ref 4.0–10.5)
nRBC: 0 % (ref 0.0–0.2)

## 2024-09-27 LAB — LACTIC ACID, PLASMA
Lactic Acid, Venous: 5 mmol/L (ref 0.5–1.9)
Lactic Acid, Venous: 5.5 mmol/L (ref 0.5–1.9)
Lactic Acid, Venous: 4.1 mmol/L (ref 0.5–1.9)

## 2024-09-27 LAB — LIPASE, BLOOD: Lipase: 26 U/L (ref 11–51)

## 2024-09-27 LAB — TROPONIN T, HIGH SENSITIVITY
Troponin T High Sensitivity: 15 ng/L (ref 0–19)
Troponin T High Sensitivity: 21 ng/L — ABNORMAL HIGH (ref 0–19)

## 2024-09-27 LAB — BLOOD GAS, ARTERIAL
Acid-base deficit: 9.9 mmol/L — ABNORMAL HIGH (ref 0.0–2.0)
Acid-base deficit: 7.4 mmol/L — ABNORMAL HIGH (ref 0.0–2.0)
Bicarbonate: 16.7 mmol/L — ABNORMAL LOW (ref 20.0–28.0)
Bicarbonate: 18.1 mmol/L — ABNORMAL LOW (ref 20.0–28.0)
Drawn by: 41977
Drawn by: 53547
O2 Saturation: 87.2 %
O2 Saturation: 90 %
Patient temperature: 37.1
Patient temperature: 37.1
pCO2 arterial: 38 mmHg (ref 32–48)
pCO2 arterial: 36 mmHg (ref 32–48)
pH, Arterial: 7.25 — ABNORMAL LOW (ref 7.35–7.45)
pH, Arterial: 7.31 — ABNORMAL LOW (ref 7.35–7.45)
pO2, Arterial: 59 mmHg — ABNORMAL LOW (ref 83–108)
pO2, Arterial: 57 mmHg — ABNORMAL LOW (ref 83–108)

## 2024-09-27 LAB — PRO BRAIN NATRIURETIC PEPTIDE: Pro Brain Natriuretic Peptide: 62.7 pg/mL (ref <300.0)

## 2024-09-27 LAB — RESP PANEL BY RT-PCR (RSV, FLU A&B, COVID)  RVPGX2
Influenza A by PCR: NEGATIVE
Influenza B by PCR: NEGATIVE
Resp Syncytial Virus by PCR: NEGATIVE
SARS Coronavirus 2 by RT PCR: POSITIVE — AB

## 2024-09-27 LAB — COMPREHENSIVE METABOLIC PANEL WITH GFR
ALT: 37 U/L (ref 0–44)
AST: 31 U/L (ref 15–41)
Albumin: 4.7 g/dL (ref 3.5–5.0)
Alkaline Phosphatase: 61 U/L (ref 38–126)
Anion gap: 14 (ref 5–15)
BUN: 20 mg/dL (ref 8–23)
CO2: 22 mmol/L (ref 22–32)
Calcium: 9.9 mg/dL (ref 8.9–10.3)
Chloride: 100 mmol/L (ref 98–111)
Creatinine, Ser: 1.26 mg/dL — ABNORMAL HIGH (ref 0.61–1.24)
GFR, Estimated: >60 mL/min (ref >60)
Glucose, Bld: 186 mg/dL — ABNORMAL HIGH (ref 70–99)
Potassium: 3.7 mmol/L (ref 3.5–5.1)
Sodium: 136 mmol/L (ref 135–145)
Total Bilirubin: 2 mg/dL — ABNORMAL HIGH (ref 0.0–1.2)
Total Protein: 8.1 g/dL (ref 6.5–8.1)

## 2024-09-27 LAB — GLUCOSE, CAPILLARY: Glucose-Capillary: 144 mg/dL — ABNORMAL HIGH (ref 70–99)

## 2024-09-27 LAB — MRSA NEXT GEN BY PCR, NASAL: MRSA by PCR Next Gen: NOT DETECTED

## 2024-09-27 LAB — HM COLONOSCOPY

## 2024-09-27 SURGERY — COLONOSCOPY
Anesthesia: Monitor Anesthesia Care

## 2024-09-27 MED ORDER — LACTATED RINGERS IV BOLUS
1000.0000 mL | Freq: Once | INTRAVENOUS | Status: AC
  Administered 2024-09-27: 1000 mL via INTRAVENOUS

## 2024-09-27 MED ORDER — ORAL CARE MOUTH RINSE: 15.0000 mL | OROMUCOSAL | Status: DC | PRN

## 2024-09-27 MED ORDER — SODIUM CHLORIDE 0.9 % IV SOLN
3.0000 g | Freq: Once | INTRAVENOUS | Status: AC
  Administered 2024-09-27: 3 g via INTRAVENOUS
  Filled 2024-09-27: qty 8

## 2024-09-27 MED ORDER — LORAZEPAM 2 MG/ML IJ SOLN: 1.0000 mg | Freq: Once | INTRAMUSCULAR | Status: AC

## 2024-09-27 MED ORDER — POLYETHYLENE GLYCOL 3350 17 G PO PACK
17.0000 g | PACK | Freq: Every day | ORAL | Status: DC | PRN
Start: 2024-09-27 — End: 2024-09-29

## 2024-09-27 MED ORDER — IPRATROPIUM-ALBUTEROL 0.5-2.5 (3) MG/3ML IN SOLN
3.0000 mL | Freq: Once | RESPIRATORY_TRACT | Status: AC
  Administered 2024-09-27: 3 mL via RESPIRATORY_TRACT
  Filled 2024-09-27: qty 3

## 2024-09-27 MED ORDER — FENTANYL CITRATE (PF) 100 MCG/2ML IJ SOLN
50.0000 ug | Freq: Once | INTRAMUSCULAR | Status: AC
  Administered 2024-09-27: 50 ug via INTRAVENOUS
  Filled 2024-09-27: qty 2

## 2024-09-27 MED ORDER — PHENYLEPHRINE 80 MCG/ML (10ML) SYRINGE FOR IV PUSH (FOR BLOOD PRESSURE SUPPORT)
PREFILLED_SYRINGE | INTRAVENOUS | Status: AC
  Filled 2024-09-27: qty 10

## 2024-09-27 MED ORDER — SODIUM CHLORIDE 0.9 % IV SOLN: INTRAVENOUS | Status: AC | PRN

## 2024-09-27 MED ORDER — PROPOFOL 1000 MG/100ML IV EMUL
0.0000 ug/kg/min | INTRAVENOUS | Status: DC
  Administered 2024-09-27: 40 ug/kg/min via INTRAVENOUS
  Administered 2024-09-28: 50 ug/kg/min via INTRAVENOUS
  Administered 2024-09-28: 40 ug/kg/min via INTRAVENOUS
  Administered 2024-09-28: 50 ug/kg/min via INTRAVENOUS
  Administered 2024-09-28: 40 ug/kg/min via INTRAVENOUS
  Administered 2024-09-28: 35 ug/kg/min via INTRAVENOUS
  Administered 2024-09-29: 25 ug/kg/min via INTRAVENOUS
  Administered 2024-09-29: 40 ug/kg/min via INTRAVENOUS
  Administered 2024-09-29: 45 ug/kg/min via INTRAVENOUS
  Administered 2024-09-29: 25 ug/kg/min via INTRAVENOUS
  Administered 2024-09-30: 30 ug/kg/min via INTRAVENOUS
  Administered 2024-09-30: 10 ug/kg/min via INTRAVENOUS
  Administered 2024-09-30: 30 ug/kg/min via INTRAVENOUS
  Administered 2024-09-30: 10 ug/kg/min via INTRAVENOUS
  Administered 2024-10-01: 40 ug/kg/min via INTRAVENOUS
  Filled 2024-09-27 (×3): qty 100
  Filled 2024-09-27 (×2): qty 200
  Filled 2024-09-27 (×9): qty 100

## 2024-09-27 MED ORDER — SUCCINYLCHOLINE CHLORIDE 200 MG/10ML IV SOSY
PREFILLED_SYRINGE | INTRAVENOUS | Status: AC
  Administered 2024-09-27: 80 mg
  Filled 2024-09-27: qty 10

## 2024-09-27 MED ORDER — CHLORHEXIDINE GLUCONATE CLOTH 2 % EX PADS
6.0000 | MEDICATED_PAD | Freq: Every day | CUTANEOUS | Status: DC
  Administered 2024-09-27 – 2024-09-28 (×2): 6 via TOPICAL

## 2024-09-27 MED ORDER — SIMETHICONE 40 MG/0.6ML PO SUSP
ORAL | Status: DC | PRN
  Administered 2024-09-27: 1

## 2024-09-27 MED ORDER — PHENYLEPHRINE 80 MCG/ML (10ML) SYRINGE FOR IV PUSH (FOR BLOOD PRESSURE SUPPORT)
PREFILLED_SYRINGE | INTRAVENOUS | Status: DC | PRN
  Administered 2024-09-27: 80 ug via INTRAVENOUS
  Administered 2024-09-27 (×2): 160 ug via INTRAVENOUS

## 2024-09-27 MED ORDER — ACETAMINOPHEN 10 MG/ML IV SOLN
1000.0000 mg | Freq: Four times a day (QID) | INTRAVENOUS | Status: AC | PRN
  Administered 2024-09-28: 1000 mg via INTRAVENOUS
  Filled 2024-09-27: qty 100

## 2024-09-27 MED ORDER — PROPOFOL 500 MG/50ML IV EMUL
INTRAVENOUS | Status: DC | PRN
  Administered 2024-09-27: 50 mg via INTRAVENOUS
  Administered 2024-09-27: 150 ug/kg/min via INTRAVENOUS
  Administered 2024-09-27: 20 mg via INTRAVENOUS

## 2024-09-27 MED ORDER — LACTATED RINGERS IV SOLN: INTRAVENOUS | Status: DC

## 2024-09-27 MED ORDER — PROPOFOL 1000 MG/100ML IV EMUL
INTRAVENOUS | Status: AC
  Administered 2024-09-27: 10 ug/kg/min via INTRAVENOUS
  Filled 2024-09-27: qty 100

## 2024-09-27 MED ORDER — EPHEDRINE SULFATE (PRESSORS) 25 MG/5ML IV SOSY
PREFILLED_SYRINGE | INTRAVENOUS | Status: DC | PRN
  Administered 2024-09-27: 10 mg via INTRAVENOUS
  Administered 2024-09-27: 5 mg via INTRAVENOUS

## 2024-09-27 MED ORDER — LIDOCAINE 2% (20 MG/ML) 5 ML SYRINGE
INTRAMUSCULAR | Status: DC | PRN
  Administered 2024-09-27: 40 mg via INTRAVENOUS

## 2024-09-27 MED ORDER — LORAZEPAM 2 MG/ML IJ SOLN
INTRAMUSCULAR | Status: AC
  Administered 2024-09-27: 1 mg via INTRAVENOUS
  Filled 2024-09-27: qty 1

## 2024-09-27 MED ORDER — SODIUM CHLORIDE 0.9 % IV SOLN
3.0000 g | Freq: Four times a day (QID) | INTRAVENOUS | Status: DC
Start: 2024-09-27 — End: 2024-10-02
  Administered 2024-09-27 – 2024-09-28 (×3): 3 g via INTRAVENOUS
  Filled 2024-09-27 (×3): qty 8

## 2024-09-27 MED ORDER — DOCUSATE SODIUM 100 MG PO CAPS
100.0000 mg | ORAL_CAPSULE | Freq: Two times a day (BID) | ORAL | Status: DC | PRN
Start: 2024-09-27 — End: 2024-09-27

## 2024-09-27 MED ORDER — SODIUM CHLORIDE 0.9 % IV SOLN: 3.0000 g | Freq: Four times a day (QID) | INTRAVENOUS | Status: DC

## 2024-09-27 MED ORDER — ONDANSETRON HCL 4 MG/2ML IJ SOLN
4.0000 mg | Freq: Once | INTRAMUSCULAR | Status: AC
  Administered 2024-09-27: 4 mg via INTRAVENOUS
  Filled 2024-09-27: qty 2

## 2024-09-27 MED ORDER — NOREPINEPHRINE 4 MG/250ML-% IV SOLN
0.0000 ug/min | INTRAVENOUS | Status: DC
  Administered 2024-09-27: 18 ug/min via INTRAVENOUS
  Administered 2024-09-27: 28 ug/min via INTRAVENOUS
  Administered 2024-09-27: 9 ug/min via INTRAVENOUS
  Administered 2024-09-28: 32 ug/min via INTRAVENOUS
  Filled 2024-09-27: qty 500
  Filled 2024-09-27 (×2): qty 250

## 2024-09-27 MED ORDER — HEPARIN SODIUM (PORCINE) 5000 UNIT/ML IJ SOLN
5000.0000 [IU] | Freq: Three times a day (TID) | INTRAMUSCULAR | Status: DC
  Administered 2024-09-27 – 2024-10-12 (×46): 5000 [IU] via SUBCUTANEOUS
  Filled 2024-09-27 (×46): qty 1

## 2024-09-27 MED ORDER — VASOPRESSIN 20 UNITS/100 ML INFUSION FOR SHOCK
INTRAVENOUS | Status: AC
  Filled 2024-09-27: qty 100

## 2024-09-27 MED ORDER — ETOMIDATE 2 MG/ML IV SOLN
INTRAVENOUS | Status: AC
  Administered 2024-09-27: 20 mg
  Filled 2024-09-27: qty 10

## 2024-09-27 MED ORDER — METHYLPREDNISOLONE SODIUM SUCC 125 MG IJ SOLR
125.0000 mg | Freq: Once | INTRAMUSCULAR | Status: AC
  Administered 2024-09-27: 125 mg via INTRAVENOUS
  Filled 2024-09-27: qty 2

## 2024-09-27 MED ORDER — LACTATED RINGERS IV BOLUS
500.0000 mL | Freq: Once | INTRAVENOUS | Status: AC
  Administered 2024-09-27: 500 mL via INTRAVENOUS

## 2024-09-27 MED ORDER — ORAL CARE MOUTH RINSE
15.0000 mL | OROMUCOSAL | Status: DC
  Administered 2024-09-27 – 2024-10-04 (×81): 15 mL via OROMUCOSAL

## 2024-09-27 MED ORDER — DOCUSATE SODIUM 50 MG/5ML PO LIQD: 100.0000 mg | Freq: Two times a day (BID) | ORAL | Status: DC | PRN

## 2024-09-27 MED ORDER — NOREPINEPHRINE 4 MG/250ML-% IV SOLN
INTRAVENOUS | Status: AC
  Filled 2024-09-27: qty 250

## 2024-09-27 NOTE — Op Note (Signed)
 [facility] Patient Name: Bryce Martinez Procedure Date: 09/27/2024 7:04 AM MRN: [id number] Date of Birth: 1951/01/02 Attending MD: Toribio Fortune , , [id number] CSN: [id number] Age: 73 Admit Type: Outpatient Procedure:                Colonoscopy Indications:              Surveillance: Personal history of adenomatous                            polyps on last colonoscopy 5 years ago Providers:                Toribio Fortune, Madelin Hunter, RN, Chad Wilson,                            Technician Referring MD:              Medicines:                Monitored Anesthesia Care Complications:            No immediate complications. Estimated Blood Loss:     Estimated blood loss: none. Procedure:                Pre-Anesthesia Assessment:                           - Prior to the procedure, a History and Physical                            was performed, and patient medications, allergies                            and sensitivities were reviewed. The patient's                            tolerance of previous anesthesia was reviewed.                           - The risks and benefits of the procedure and the                            sedation options and risks were discussed with the                            patient. All questions were answered and informed                            consent was obtained.                           - ASA Grade Assessment: II - A patient with mild                            systemic disease.                           After obtaining informed consent, the colonoscope  was passed under direct vision. Throughout the                            procedure, the patient's blood pressure, pulse, and                            oxygen saturations were monitored continuously. The                            [id number] ([id number]) was introduced through the                            anus and advanced to the the ileocolonic                             anastomosis. The colonoscopy was performed without                            difficulty. The patient tolerated the procedure                            well. The quality of the bowel preparation was fair. Scope In: 7:40:40 AM Scope Out: 8:12:44 AM Scope Withdrawal Time: 0 hours 16 minutes 58 seconds  Total Procedure Duration: 0 hours 32 minutes 4 seconds  Findings:      The perianal exam findings include rectal prolapse with hemorrhoids.      There was evidence of a prior end-to-side ileo-colonic anastomosis in       the ascending colon. This was patent and was characterized by healthy       appearing mucosa. The anastomosis was traversed.      Five sessile polyps were found in the transverse colon. The polyps were       2 to 6 mm in size. These polyps were removed with a cold snare.       Resection and retrieval were complete.      Non-bleeding internal hemorrhoids were found during retroflexion. The       hemorrhoids were small. Impression:               - Preparation of the colon was fair.                           - Rectal prolapse with hemorrhoids found on                            perianal exam.                           - Patent end-to-side ileo-colonic anastomosis,                            characterized by healthy appearing mucosa.                           - Five 2 to 6 mm polyps in the transverse colon,  removed with a cold snare. Resected and retrieved.                           - Non-bleeding internal hemorrhoids. Moderate Sedation:      Per Anesthesia Care Recommendation:           - Discharge patient to home (ambulatory).                           - Resume previous diet.                           - Await pathology results.                           - Repeat colonoscopy in 3 years for surveillance. Procedure Code(s):        --- Professional ---                           [phone], Colonoscopy, flexible; with removal of                             tumor(s), polyp(s), or other lesion(s) by snare                            technique Diagnosis Code(s):        --- Professional ---                           Z86.010, Personal history of colonic polyps                           K64.8, Other hemorrhoids                           Z98.0, Intestinal bypass and anastomosis status                           D12.3, Benign neoplasm of transverse colon (hepatic                            flexure or splenic flexure) CPT copyright 2022 American Medical Association. All rights reserved. The codes documented in this report are preliminary and upon coder review may  be revised to meet current compliance requirements. Toribio Fortune, MD Toribio Fortune,  09/27/2024 8:24:25 AM This report has been signed electronically. Number of Addenda: 0

## 2024-09-27 NOTE — ED Provider Notes (Signed)
 [place] EMERGENCY DEPARTMENT AT [facility] Provider Note   CSN: [id number] Arrival date & time: 09/27/24  1025     Patient presents with: Abdominal Pain   Bryce Martinez is a 73 y.o. male.   Patient is a 73 year old male who presents to the emergency department with his wife secondary to shortness of breath, abdominal pain and back pain.  Patient notes that he did have a colonoscopy performed this morning and notes that he did vomit during the procedure.  They did watch him after procedure but after arriving home his symptoms worsened.  Patient notes that he has had no other nausea or vomiting since the procedure.  He denies any associated fever or chills.   Abdominal Pain Associated symptoms: shortness of breath        Prior to Admission medications   Medication Sig Start Date End Date Taking? Authorizing Provider  losartan  (COZAAR ) 100 MG tablet TAKE 1 TABLET BY MOUTH EVERY DAY 03/24/24   Antonetta Rollene BRAVO, MD  meclizine  (ANTIVERT ) 25 MG tablet Take 1 tablet (25 mg total) by mouth 2 (two) times daily as needed for dizziness. 08/24/24   Tobie Suzzane POUR, MD  Misc. Devices MISC Blood pressure cuff/device - 1. ICD10: I10 08/24/24   Tobie Suzzane POUR, MD  spironolactone  (ALDACTONE ) 25 MG tablet TAKE 1 TABLET (25 MG TOTAL) BY MOUTH DAILY. 06/24/24   Antonetta Rollene BRAVO, MD    Allergies: Patient has no known allergies.    Review of Systems  Respiratory:  Positive for shortness of breath.   Gastrointestinal:  Positive for abdominal pain.  All other systems reviewed and are negative.   Updated Vital Signs BP (!) 167/143   Pulse (!) 112   Temp 97.8 F (36.6 C) (Temporal)   Resp 18   Ht 6' 1 (1.854 m)   Wt 81.2 kg   SpO2 (!) 88%   BMI 23.62 kg/m   Physical Exam Vitals and nursing note reviewed.  Constitutional:      General: He is not in acute distress.    Appearance: Normal appearance. He is not ill-appearing.  HENT:     Head: Normocephalic and  atraumatic.     Nose: Nose normal.     Mouth/Throat:     Mouth: Mucous membranes are moist.  Eyes:     Extraocular Movements: Extraocular movements intact.     Conjunctiva/sclera: Conjunctivae normal.     Pupils: Pupils are equal, round, and reactive to light.  Cardiovascular:     Rate and Rhythm: Normal rate and regular rhythm.     Pulses: Normal pulses.     Heart sounds: Normal heart sounds. No murmur heard.    No gallop.  Pulmonary:     Effort: Respiratory distress present.     Breath sounds: No stridor. Rhonchi and rales present.  Abdominal:     General: Abdomen is flat. Bowel sounds are normal. There is no distension.     Palpations: Abdomen is soft. There is no mass.     Tenderness: There is generalized abdominal tenderness.     Hernia: No hernia is present.  Musculoskeletal:        General: Normal range of motion.     Cervical back: Normal range of motion and neck supple.  Skin:    General: Skin is warm and dry.     Coloration: Skin is not jaundiced.     Findings: No erythema or rash.  Neurological:     General: No focal  deficit present.     Mental Status: He is alert and oriented to person, place, and time. Mental status is at baseline.     Cranial Nerves: No cranial nerve deficit.     Motor: No weakness.  Psychiatric:        Mood and Affect: Mood normal.        Behavior: Behavior normal.        Thought Content: Thought content normal.        Judgment: Judgment normal.     (all labs ordered are listed, but only abnormal results are displayed) Labs Reviewed  CULTURE, BLOOD (ROUTINE X 2)  CULTURE, BLOOD (ROUTINE X 2)  CBC WITH DIFFERENTIAL/PLATELET  COMPREHENSIVE METABOLIC PANEL WITH GFR  LIPASE, BLOOD  URINALYSIS, ROUTINE W REFLEX MICROSCOPIC  LACTIC ACID, PLASMA  LACTIC ACID, PLASMA  BLOOD GAS, ARTERIAL  TROPONIN T, HIGH SENSITIVITY    EKG: EKG Interpretation Date/Time:  Monday September 27 2024 10:36:42 EST Ventricular Rate:  104 PR  Interval:  126 QRS Duration:  90 QT Interval:  340 QTC Calculation: 447 R Axis:   94  Text Interpretation: Sinus tachycardia Rightward axis Nonspecific ST abnormality Abnormal ECG When compared with ECG of 27-Sep-2024 10:36, No significant change was found Confirmed by Charlyn Sora [phone]) on 09/27/2024 10:44:24 AM  Radiology: No results found.   .Critical Care  Performed by: Daralene Lonni BIRCH, PA-C Authorized by: Daralene Lonni BIRCH, PA-C   Critical care provider statement:    Critical care time (minutes):  45   Critical care was necessary to treat or prevent imminent or life-threatening deterioration of the following conditions:  Respiratory failure   Critical care was time spent personally by me on the following activities:  Development of treatment plan with patient or surrogate, discussions with consultants, evaluation of patient's response to treatment, examination of patient, ordering and review of laboratory studies, ordering and review of radiographic studies, ordering and performing treatments and interventions, pulse oximetry, re-evaluation of patient's condition and review of old charts   I assumed direction of critical care for this patient from another provider in my specialty: no     Care discussed with: admitting provider      Medications Ordered in the ED  methylPREDNISolone  sodium succinate (SOLU-MEDROL ) 125 mg/2 mL injection 125 mg (has no administration in time range)  ipratropium-albuterol  (DUONEB) 0.5-2.5 (3) MG/3ML nebulizer solution 3 mL (has no administration in time range)  Ampicillin -Sulbactam (UNASYN ) 3 g in sodium chloride  0.9 % 100 mL IVPB (has no administration in time range)    Clinical Course as of 09/27/24 1226  Mon Sep 27, 2024  1130 Blood gas, arterial(!) ABG shows acidosis, likely metabolic, as pCO2 is normal.  Lactate pending.  I requested consult to critical care medicine as I suspect that patient is having early ARDS.  Patient  received dexamethasone .  P to F ratio is 98, on BiPAP. [AN]    Clinical Course User Index [AN] Charlyn Sora, MD                                 Medical Decision Making Amount and/or Complexity of Data Reviewed Labs: ordered. Decision-making details documented in ED Course. Radiology: ordered.  Risk Prescription drug management. Decision regarding hospitalization.   This patient presents to the ED for concern of shortness of breath differential diagnosis includes aspiration pneumonia, pneumonia, ARDS, COPD, CHF, pulmonary embolus, pericarditis, myocarditis, endocarditis    Additional history  obtained:  Additional history obtained from family External records from outside source obtained and reviewed including medical records   Lab Tests:  I Ordered, and personally interpreted labs.  The pertinent results include: No leukocytosis, no anemia mild elevation in creatinine, normal electrolytes, normal liver function, normal lipase, elevated lactic acid, ABG with acidosis and pO2 of 59, normal troponin   Imaging Studies ordered:  I ordered imaging studies including chest x-ray I independently visualized and interpreted imaging which showed left-sided infiltrate I agree with the radiologist interpretation   Medicines ordered and prescription drug management:  I ordered medication including Unasyn , IV fluids, Solu-Medrol , DuoNeb, Zofran  for pneumonia, aspiration, ARDS Reevaluation of the patient after these medicines showed that the patient improved I have reviewed the patients home medicines and have made adjustments as needed   Problem List / ED Course:  Patient does remain stable on BiPAP at this point though does remain tachypneic and does remain critically ill.  He has been treated with Unasyn , IV fluids, DuoNeb and steroids.  Suspect that he aspirated during his procedure this morning.  We have discussed patient case with critical care who has excepted for admission  at this time.  Patient has been fully evaluated by attending physician who has been involved throughout the patient's care.  Suspect ARDS at this point.  Will continue to monitor prior to transfer.   Social Determinants of Health:  None        Final diagnoses:  None    ED Discharge Orders     None          Daralene Lonni JONETTA DEVONNA 09/27/24 1229    Charlyn Sora, MD 09/28/24 [phone]

## 2024-09-27 NOTE — Telephone Encounter (Signed)
 noted

## 2024-09-27 NOTE — Anesthesia Postprocedure Evaluation (Signed)
 Anesthesia Post Note  Patient: CORTLIN MARANO  Procedure(s) Performed: COLONOSCOPY  Patient location during evaluation: Phase II Anesthesia Type: MAC Level of consciousness: awake Pain management: pain level controlled Vital Signs Assessment: post-procedure vital signs reviewed and stable Respiratory status: spontaneous breathing and respiratory function stable Cardiovascular status: blood pressure returned to baseline and stable Postop Assessment: no headache and no apparent nausea or vomiting Anesthetic complications: no Comments: Late entry     Last Vitals:  Vitals:   09/27/24 0828 09/27/24 0835  BP: 117/79 117/72  Pulse: 92 85  Resp: 15 19  Temp:    SpO2: 94% 93%    Last Pain:  Vitals:   09/27/24 0835  TempSrc:   PainSc: 0-No pain       Possible aspiration during case - clear liquid with bilious tint.  Patient observed an extra 30 mins.  No respiratory distress.  Speaking in complete sentences.  Able to ambulate to the car.  Advised to return if respiratory symptoms develop.          Yvonna JINNY Bosworth

## 2024-09-27 NOTE — H&P (Signed)
 Bryce Martinez is an 73 y.o. male.   Chief Complaint: history of colon polyps HPI: 73 y/o M with PMH Hep C, HTN, HLD, GERD, coming for colorectal cancer screening and history of colon polyps.  Last colonoscopy was performed in 2020, had 2 tubular adenomas removed.  The patient denies having any complaints such as melena, hematochezia, abdominal pain or distention, change in her bowel movement consistency or frequency, no changes in weight recently.  He was diagnosed with colon cancer in his late 37s.   Past Medical History:  Diagnosis Date   Allergy    Arthritis    ED (erectile dysfunction)    GERD (gastroesophageal reflux disease)    Occasionally   Hepatitis C    Hyperlipidemia    Hypertension     Past Surgical History:  Procedure Laterality Date   APPENDECTOMY     CHOLECYSTECTOMY N/A 04/25/2014   Procedure: LAPAROSCOPIC CHOLECYSTECTOMY;  Surgeon: Oneil DELENA Budge, MD;  Location: [facility] ORS;  Service: General;  Laterality: N/A;   COLONOSCOPY N/A 09/01/2019   Procedure: COLONOSCOPY;  Surgeon: Golda Claudis PENNER, MD;  Location: [facility] SU[facility];  Service: Endoscopy;  Laterality: N/A;  930   HEMICOLECTOMY Right    lap hand assisted   POLYPECTOMY  09/01/2019   Procedure: POLYPECTOMY;  Surgeon: Golda Claudis PENNER, MD;  Location: [facility];  Service: Endoscopy;;  colon   right shoulder surgery Right 03/2005   rotator cuff repair    Family History  Problem Relation Age of Onset   Hypertension Mother    Cancer Father        colon    Hypertension Sister    Hypertension Brother    Diabetes Brother    Drug abuse Brother    Hypertension Daughter    Hypertension Son    Social History:  reports that he quit smoking about 52 years ago. His smoking use included cigarettes. He started smoking about 67 years ago. He has a 4.4 pack-year smoking history. He has been exposed to tobacco smoke. He has never used smokeless tobacco. He reports that he does not currently use alcohol. He reports that he  does not use drugs.  Allergies: No Known Allergies  Medications Prior to Admission  Medication Sig Dispense Refill   losartan  (COZAAR ) 100 MG tablet TAKE 1 TABLET BY MOUTH EVERY DAY 90 tablet 1   meclizine  (ANTIVERT ) 25 MG tablet Take 1 tablet (25 mg total) by mouth 2 (two) times daily as needed for dizziness. 30 tablet 0   spironolactone  (ALDACTONE ) 25 MG tablet TAKE 1 TABLET (25 MG TOTAL) BY MOUTH DAILY. 90 tablet 3   Misc. Devices MISC Blood pressure cuff/device - 1. ICD10: I10 1 each 0    No results found for this or any previous visit (from the past 48 hours). No results found.  Review of Systems  All other systems reviewed and are negative.   Blood pressure (!) 179/103, pulse 66, temperature 98 F (36.7 C), temperature source Oral, resp. rate 18, height 6' 1 (1.854 m), weight 81.2 kg, SpO2 98%. Physical Exam  GENERAL: The patient is AO x3, in no acute distress. HEENT: Head is normocephalic and atraumatic. EOMI are intact. Mouth is well hydrated and without lesions. NECK: Supple. No masses LUNGS: Clear to auscultation. No presence of rhonchi/wheezing/rales. Adequate chest expansion HEART: RRR, normal s1 and s2. ABDOMEN: Soft, nontender, no guarding, no peritoneal signs, and nondistended. BS +. No masses. EXTREMITIES: Without any cyanosis, clubbing, rash, lesions or edema. NEUROLOGIC:  AOx3, no focal motor deficit. SKIN: no jaundice, no rashes  Assessment/Plan 73 y/o M with PMH Hep C, HTN, HLD, GERD, coming for colorectal cancer screening and history of colon polyps.  Will proceed with colonoscopy.  Toribio Eartha Flavors, MD 09/27/2024, 7:30 AM

## 2024-09-27 NOTE — ED Triage Notes (Signed)
 Pt arrived via POV following a colonoscopy this morning. Pt reports going home and he began feeling short of breath, experiencing upper back and abdominal pain. Pt reports having previous colonoscopies in the past and never having these symptoms following them. Pt reports taking 2 Pepto Bismol w/o relief.

## 2024-09-27 NOTE — TOC CM/SW Note (Signed)
 Transition of Care [facility]) - Inpatient Brief Assessment   Patient Details  Name: CHARON AKAMINE MRN: [id number] Date of Birth: 05-19-1951  [facility] CM/SW Contact:    Lauraine FORBES Saa, LCSWA Phone Number: 09/27/2024, 3:38 PM   Clinical Narrative:  3:38 PM Per chart review, patient resides at home with spouse. Patient has a PCP and insurance. Patient does not have SNF/HH/DME history. Patient's preferred pharmacy's are [facility] [address], [facility] [address], and [facility]. Patient is currently intubated with feeding tube and unable to answer SDOH questions. No TOC needs identified at this time. TOC will continue to follow.  Transition of Care Asessment: Insurance and Status: Insurance coverage has been reviewed Patient has primary care physician: Yes Home environment has been reviewed: Private Residence Prior level of function:: N/A Prior/Current Home Services: No current home services Social Drivers of Health Review: SDOH reviewed no interventions necessary (As of August 25th, 2025. Patient is currently intubated.) Readmission risk has been reviewed: Yes Transition of care needs: no transition of care needs at this time

## 2024-09-27 NOTE — Progress Notes (Signed)
 NAME:  Bryce Martinez, MRN:  [id number], DOB:  Apr 01, 1951, LOS: 0 ADMISSION DATE:  09/27/2024, CONSULTATION DATE:  11/24 REFERRING MD:  EDP CHIEF COMPLAINT:  ARDS   History of Present Illness:  Bryce Martinez is a 73 year old male with a pertinent past medical history of hypertension, hyperlipidemia, arthritis, GERD, and history of  hepatitis C s/p treatment and sustained virologic response with advanced fibrosis who presented to [facility] this morning for a routine screening colonoscopy. During the procedure, it was noted by anesthesia and gastroenterology that the patient had a clear/slightly bilious emesis during the procedure. He has had prior colonoscopies without complication. He was monitored after the procedure to be saturating 93% on room air.  Upon discharge, patient denied pain, SOB, or cough. Shortly after returning home, the patient noticed pain to the middle of his back and abdomen, felt SOB, and could not breathe deeply. Took 2 Pepto Bismol without relief. Pt's wife was advised to take him to [facility] ED, where he was placed on NRB at 15L and SpO2 remained at 92%.  CXR was done and showed pneumonia in the left lung and possible pleural effusion. Patient then placed on BiPAP and HFNC, however remained dyspneic and tachypneic. Denies further N/V since procedure. Patient was ultimately intubated for airway.   PCCM was consulted for transfer to [facility] and admission for further management. Pertinent  Medical History   Past Medical History:  Diagnosis Date   Allergy    Arthritis    ED (erectile dysfunction)    GERD (gastroesophageal reflux disease)    Occasionally   Hepatitis C    Hyperlipidemia    Hypertension    Significant Hospital Events: Including procedures, antibiotic start and stop dates in addition to other pertinent events   11/24 -Colonoscopy at [facility] with Dr. Eartha Flavors, postop O2 requirements showing 93% on RA at discharge. 11/24- Trialed 15L  NRB, BiPAP without respiratory improvement. PCCM consulted for admission and worsening respiratory status,  intubated in [facility] ED.  11/24- Left radial arterial line placed   Interim History / Subjective:  Patient's wife and brother at bedside, who confirmed the day's events. Also mention that patient was a previous smoker but quit in 1990s. No history of HF.  Objective   Blood pressure 104/78, pulse (!) 134, temperature (!) 100.6 F (38.1 C), temperature source Axillary, resp. rate (!) 25, height 6' 1 (1.854 m), weight 81.2 kg, SpO2 92%.    Vent Mode: PRVC FiO2 (%):  [60 %-100 %] 100 % Set Rate:  [18 bmp-20 bmp] 20 bmp Vt Set:  [520 mL-630 mL] 630 mL PEEP:  [6 cmH20-10 cmH20] 10 cmH20 Plateau Pressure:  [17 cmH20-23 cmH20] 17 cmH20   Intake/Output Summary (Last 24 hours) at 09/27/2024 1637 Last data filed at 09/27/2024 1600 Gross per 24 hour  Intake 1152.82 ml  Output 300 ml  Net 852.82 ml   Filed Weights   09/27/24 1034  Weight: 81.2 kg    Examination: General: toxic appearing male, NAD HENT: [place], ETT in place with slightly bloody secretions. OG tube in place in right nare Lungs: MV, lower left lung field with coarse breath sounds. No crackles or wheezing. Cardiovascular: tachycardic, no murmurs/rubs/gallops, no JVD Abdomen: soft, bowel sounds +, nondistended, no rigidity or masses. Extremities: Bilateral UE/LE cool to touch, no edema. DP/PT pulses bilaterally diminished. Radial pulses 2+. Neuro: intubated and sedated. Not responsive to voice, touch, or noxious stimuli.  GU: No foley placed  Pertinent labs, imaging,  and tests in the past 24 hours: Na: 136 K: 3.7 BUN/Cr: 20/1.26 (baseline 1.16-1.18) Cl: 100 BG: 186 > 144 H/H: 15.3/48.0 WBC: 4.9 Hs-troponins: 15 > 21 Lipase 26 Lactic acid: 5.0 > 5.5 Pro-BNP 62.7   ABG: pH 7.25 pCO2 38 pO2 59  bicarb 16.7  CXR prior to intubation showing left lung pneumonia with diffuse airspace opacity and probable small left  pleural effusion. No pneumothorax.   CXR post-intubation showing worsening multilobar pneumonia with increased involvement in the right perihilar/infrahilar regions compared to prior.   Resolved problem list   Assessment and Plan   Respiratory Acute Hypoxic Respiratory Failure 2/2 Acute Respiratory Distress Syndrome from Aspiration Pneumonia P: - Patient vomited during colonoscopy this morning, likely aspirating on emesis  - Intubated in [facility] ED on 11/24, received a total of ketamine  120mcg, and propofol  40mcg for sedation - FIO2 100%, PEEP 10, TV 630 - Maintain Propofol  40mcg for sedation - VAP protocol - SAT and SBT as tolerated when appropriate - Continue LR - Continue IV Unasyn  (D1 = 11/24) due to underlying aspiration  - Bcx collected and pending - Pending respiratory panel, MRSA swab  Infection History of Hepatitis C with Advanced Fibrosis P: -s/p treatment  -Currently fibrotic per elastography 02/2023. RUQ US  on 03/2024 WNL. -Continue to follow outpatient with GI, already established.  Cardiac Shock, presumed likely septic 2/2 aspiration pneumonia  P: - Warm extremities, normal pulse pressure, hypotensive and tachycardic with HR in 130s - On Levophed  , will wean as appropriate to maintain MAP > 65. If requiring Levophed  dosing > , will need to consider central line placement as patient has peripheral arterial line - Likely intravascularly depleted due to colonoscopy bowel prep preceding admission - Passive leg raise, US  of IVC to assess for fluid responsiveness-- patient is intravascularly dry, will fluid resuscitate with LR 1.5L total, then reassess  History of Hypertension P: -Hold home Losartan  100mg  and Spironolactone  25mg  daily while intubated  Metabolic Anion Gap Metabolic Acidosis P: - Likely secondary to lactic acidosis from tissue hypoperfusion, currently trending lactic acid - Anion gap 19.3, improving on repeat ABG to 17.9 - Lactic acid 5.0 > 5.5   - Trend daily BMP - Will repeat afternoon lactic acid and in AM 11/25 - Will get echocardiogram to assess LVEF  - pro-BNP 62.7 - Received Dexamethasone    Tubes/Lines/Therapy Tubes: ETT, OG tube Lines: Left radial arterial line, 3 PIVs (2 right, 1 left) DVT Ppx: [place] Heparin  GI Ppx: None  Labs   CBC: Recent Labs  Lab 09/27/24 1106  WBC 4.9  NEUTROABS 3.7  HGB 15.3  HCT 48.0  MCV 87.6  PLT 275    Basic Metabolic Panel: Recent Labs  Lab 09/27/24 1106  NA 136  K 3.7  CL 100  CO2 22  GLUCOSE 186*  BUN 20  CREATININE 1.26*  CALCIUM  9.9   GFR: Estimated Creatinine Clearance: 59.9 mL/min (A) (by C-G formula based on SCr of 1.26 mg/dL (H)). Recent Labs  Lab 09/27/24 1106 09/27/24 1157  WBC 4.9  --   LATICACIDVEN 5.0* 5.5*    Liver Function Tests: Recent Labs  Lab 09/27/24 1106  AST 31  ALT 37  ALKPHOS 61  BILITOT 2.0*  PROT 8.1  ALBUMIN 4.7   Recent Labs  Lab 09/27/24 1106  LIPASE 26   No results for input(s): AMMONIA in the last 168 hours.  ABG    Component Value Date/Time   PHART 7.31 (L) 09/27/2024 1529   PCO2ART 36  09/27/2024 1529   PO2ART 57 (L) 09/27/2024 1529   HCO3 18.1 (L) 09/27/2024 1529   ACIDBASEDEF 7.4 (H) 09/27/2024 1529   O2SAT 90 09/27/2024 1529     Coagulation Profile: No results for input(s): INR, PROTIME in the last 168 hours.  Cardiac Enzymes: No results for input(s): CKTOTAL, CKMB, CKMBINDEX, TROPONINI in the last 168 hours.  HbA1C: Hgb A1c MFr Bld  Date/Time Value Ref Range Status  09/16/2017 09:58 AM 5.5 4.8 - 5.6 % Final    Comment:             Prediabetes: 5.7 - 6.4          Diabetes: >6.4          Glycemic control for adults with diabetes: <7.0   05/20/2015 10:10 AM 5.8 (H) <5.7 % Final    Comment:                                                                           According to the ADA Clinical Practice Recommendations for 2011, when HbA1c is used as a screening test:     >=6.5%    Diagnostic of Diabetes Mellitus            (if abnormal result is confirmed)   5.7-6.4%   Increased risk of developing Diabetes Mellitus   References:Diagnosis and Classification of Diabetes Mellitus,Diabetes Care,2011,34(Suppl 1):S62-S69 and Standards of Medical Care in         Diabetes - 2011,Diabetes Care,2011,34 (Suppl 1):S11-S61.       CBG: Recent Labs  Lab 09/27/24 1508  GLUCAP 144*    Past Medical History:  He,  has a past medical history of Allergy, Arthritis, ED (erectile dysfunction), GERD (gastroesophageal reflux disease), Hepatitis C, Hyperlipidemia, and Hypertension.   Surgical History:   Past Surgical History:  Procedure Laterality Date   APPENDECTOMY     CHOLECYSTECTOMY N/A 04/25/2014   Procedure: LAPAROSCOPIC CHOLECYSTECTOMY;  Surgeon: Oneil DELENA Budge, MD;  Location: [facility] ORS;  Service: General;  Laterality: N/A;   COLONOSCOPY N/A 09/01/2019   Procedure: COLONOSCOPY;  Surgeon: Golda Claudis PENNER, MD;  Location: [facility] ENDO [facility]ITE;  Service: Endoscopy;  Laterality: N/A;  930   HEMICOLECTOMY Right    lap hand assisted   POLYPECTOMY  09/01/2019   Procedure: POLYPECTOMY;  Surgeon: Golda Claudis PENNER, MD;  Location: [facility] END[facility];  Service: Endoscopy;;  colon   right shoulder surgery Right 03/2005   rotator cuff repair     Social History:   reports that he quit smoking about 52 years ago. His smoking use included cigarettes. He started smoking about 67 years ago. He has a 4.4 pack-year smoking history. He has been exposed to tobacco smoke. He has never used smokeless tobacco. He reports that he does not currently use alcohol. He reports that he does not use drugs.   Family History:  His family history includes Cancer in his father; Diabetes in his brother; Drug abuse in his brother; Hypertension in his brother, daughter, mother, sister, and son.   Allergies Allergies  Allergen Reactions   Amlodipine  Other (See Comments)    unknown     Home Medications  Prior to  Admission medications   Medication Sig Start  Date End Date Taking? Authorizing Provider  losartan  (COZAAR ) 100 MG tablet TAKE 1 TABLET BY MOUTH EVERY DAY 03/24/24   Antonetta Rollene BRAVO, MD  meclizine  (ANTIVERT ) 25 MG tablet Take 1 tablet (25 mg total) by mouth 2 (two) times daily as needed for dizziness. 08/24/24   Tobie Suzzane POUR, MD  Misc. Devices MISC Blood pressure cuff/device - 1. ICD10: I10 08/24/24   Tobie Suzzane POUR, MD  spironolactone  (ALDACTONE ) 25 MG tablet TAKE 1 TABLET (25 MG TOTAL) BY MOUTH DAILY. 06/24/24   Antonetta Rollene BRAVO, MD     Critical care time: 38    Oda Placke, DO Internal Medicine Resident, PGY-1 Please contact the on call pager at [phone] for any urgent or emergent needs. 4:37 PM 09/27/2024

## 2024-09-27 NOTE — Procedures (Signed)
 Central Venous Catheter Insertion Procedure Note  NITISH ROES  [id number]  07-27-1951  Date:09/27/24  Time:8:25 PM   Provider Performing:Cassady Stanczak Layman   Procedure: Insertion of Non-tunneled Central Venous Catheter(36556) with US  guidance (23062)   Indication(s) Medication administration  Consent Risks of the procedure as well as the alternatives and risks of each were explained to the patient and/or caregiver.  Consent for the procedure was obtained and is signed in the bedside chart  Anesthesia See mar  Timeout Verified patient identification, verified procedure, site/side was marked, verified correct patient position, special equipment/implants available, medications/allergies/relevant history reviewed, required imaging and test results available.  Sterile Technique Maximal sterile technique including full sterile barrier drape, hand hygiene, sterile gown, sterile gloves, mask, hair covering, sterile ultrasound probe cover (if used).  Procedure Description Area of catheter insertion was cleaned with chlorhexidine  and draped in sterile fashion.  With real-time ultrasound guidance a central venous catheter was placed into the right internal jugular vein. Nonpulsatile blood flow and easy flushing noted in all ports.  The catheter was sutured in place and sterile dressing applied.  Complications/Tolerance None; patient tolerated the procedure well. Chest X-ray is ordered to verify placement for internal jugular or subclavian cannulation.   Chest x-ray is not ordered for femoral cannulation.  EBL Minimal  Specimen(s) None

## 2024-09-27 NOTE — ED Notes (Signed)
 Care link called to transport patient. Nurse made aware. Spoke to Usaa

## 2024-09-27 NOTE — Progress Notes (Signed)
 Pt evaluated in post-op by MD Rebecka prior to discharge due to oxygen requirements after procedure. O2 saturation 93% on RA upon discharge. Verified that pt is ready for discharge at this time. Pt denies ShOB and cough. Ambulatory to exit without complication.

## 2024-09-27 NOTE — ED Provider Notes (Signed)
 Physical Exam  BP 105/89   Pulse (!) 135   Temp 97.8 F (36.6 C) (Temporal)   Resp (!) 37   Ht 6' 1 (1.854 m)   Wt 81.2 kg   SpO2 93%   BMI 23.62 kg/m   Physical Exam  Procedures  Procedure Name: Intubation Date/Time: 09/27/2024 1:05 PM  Performed by: Charlyn Sora, MDPre-anesthesia Checklist: Patient identified, Patient being monitored, Emergency Drugs available, Timeout performed and Suction available Oxygen Delivery Method: Non-rebreather mask Preoxygenation: Pre-oxygenation with 100% oxygen Induction Type: Rapid sequence Ventilation: Mask ventilation without difficulty Laryngoscope Size: Glidescope and 4 Grade View: Grade II Tube size: 7.5 mm Number of attempts: 1 Placement Confirmation: ETT inserted through vocal cords under direct vision, CO2 detector and Breath sounds checked- equal and bilateral Secured at: 25 cm Tube secured with: ETT holder Difficulty Due To: Difficulty was anticipated      ED Course / MDM   Clinical Course as of 09/27/24 1305  Mon Sep 27, 2024  1130 Blood gas, arterial(!) ABG shows acidosis, likely metabolic, as pCO2 is normal.  Lactate pending.  I requested consult to critical care medicine as I suspect that patient is having early ARDS.  Patient received dexamethasone .  P to F ratio is 98, on BiPAP. [AN]  1303 I discussed the case with Dr. Meade, pulmonary critical care.  They excepted the patient to [facility]  She recommended trying high flow nasal oxygen to see if patient's work of breathing improves.  Upon attempt of high flow oxygen, patient's respiratory rate went up to 48 and his O2 sats went down into the upper 80s.  We placed him back on BiPAP.  He continued to have pulse ox in the 90s and upper 80s.  Respiratory it still 40.  We proceeded to intubation, Dr. Meade made aware. [AN]    Clinical Course User Index [AN] Charlyn Sora, MD   Medical Decision Making Amount and/or Complexity of Data Reviewed Labs: ordered.  Decision-making details documented in ED Course. Radiology: ordered.  Risk Prescription drug management. Decision regarding hospitalization.   73 year old male comes in with chief complaint of shortness of breath.  Patient noted to be in hypoxic respiratory failure, thought to be because of ARDS secondary to pneumonitis.  He has received IV Unasyn .  He has received steroid. Patient ultimately intubated, without any complication.  Arterial line placed by the respiratory therapist under my supervision.  No complications.  OG tube was also placed.  He is stable for transport.  Prior to transport, patient's BP did drop.  We had prepared for hypotension.  Intubation but patient did not require phenylephrine .  A-line was placed, initially BP was fine.  When transport team arrived, patient's BP was noted to be low.  We had given patient 1 L of IV fluid.  I suspect he is slightly hypovolemic given that he just had colonoscopy yesterday, but wanted to be fluid restrictive given ARDS.   We started norepinephrine  drip.  Patient was given additional fluid bolus.  Remained tachycardic.  BP improved in the 90s systolic.  In this case, I think additional fluid resuscitation will be deferred to the ICU team, given that fluid can potentially create more harm.    CRITICAL CARE Performed by: Lakindra Wible   Total critical care time: 51 minutes  Critical care time was exclusive of separately billable procedures and treating other patients.  Critical care was necessary to treat or prevent imminent or life-threatening deterioration.  Critical care was time spent personally  by me on the following activities: development of treatment plan with patient and/or surrogate as well as nursing, discussions with consultants, evaluation of patient's response to treatment, examination of patient, obtaining history from patient or surrogate, ordering and performing treatments and interventions, ordering and review  of laboratory studies, ordering and review of radiographic studies, pulse oximetry and re-evaluation of patient's condition.   Charlyn Sora, MD 09/27/24 1309    Charlyn Sora, MD 09/28/24 [phone]

## 2024-09-27 NOTE — ED Notes (Signed)
 Pt on a non-rebreather at 15L--SpO2 92%. RT at bedside

## 2024-09-27 NOTE — H&P (Signed)
 See progress note from Dr. Isobel on the same day.

## 2024-09-27 NOTE — Discharge Instructions (Signed)
 You are being discharged to home.  Resume your previous diet.  We are waiting for your pathology results.  Your physician has recommended a repeat colonoscopy in three years for surveillance.

## 2024-09-27 NOTE — Telephone Encounter (Signed)
 Attempted to reach patient. Chart shows patient checked into ED at 1025

## 2024-09-27 NOTE — Anesthesia Preprocedure Evaluation (Addendum)
 Anesthesia Evaluation  Patient identified by MRN, date of birth, ID band Patient awake    Reviewed: Allergy & Precautions, H&P , NPO status , Patient's Chart, lab work & pertinent test results, reviewed documented beta blocker date and time   Airway Mallampati: II  TM Distance: >3 FB Neck ROM: full    Dental no notable dental hx.    Pulmonary neg pulmonary ROS, former smoker   Pulmonary exam normal breath sounds clear to auscultation       Cardiovascular Exercise Tolerance: Good hypertension,  Rhythm:regular Rate:Normal     Neuro/Psych  Neuromuscular disease  negative psych ROS   GI/Hepatic ,GERD  ,,(+) Hepatitis -  Endo/Other  negative endocrine ROS    Renal/GU negative Renal ROS  negative genitourinary   Musculoskeletal   Abdominal   Peds  Hematology negative hematology ROS (+)   Anesthesia Other Findings   Reproductive/Obstetrics negative OB ROS                              Anesthesia Physical Anesthesia Plan  ASA: 2  Anesthesia Plan: MAC   Post-op Pain Management:    Induction:   PONV Risk Score and Plan: Propofol  infusion  Airway Management Planned:   Additional Equipment:   Intra-op Plan:   Post-operative Plan:   Informed Consent: I have reviewed the patients History and Physical, chart, labs and discussed the procedure including the risks, benefits and alternatives for the proposed anesthesia with the patient or authorized representative who has indicated his/her understanding and acceptance.     Dental Advisory Given  Plan Discussed with: CRNA  Anesthesia Plan Comments:         Anesthesia Quick Evaluation

## 2024-09-27 NOTE — Telephone Encounter (Signed)
 Patient's wife disconnected from PAS, stating she would call Zelda Salmon or go to the ED. This RN attempted to call back and left a VM advising ED or 911 if still symptomatic (DPR allows detailed message). Provided call back number.   Copied from CRM [id number]. Topic: Clinical - Red Word Triage >> Sep 27, 2024  9:58 AM Carrielelia G wrote: Red Word that prompted transfer to Nurse Triage: just had a colonoscopy this morning and having middle back pain and can not take a deep breath.

## 2024-09-27 NOTE — Transfer of Care (Signed)
 Immediate Anesthesia Transfer of Care Note  Patient: Bryce Martinez  Procedure(s) Performed: COLONOSCOPY  Patient Location: Endoscopy Unit  Anesthesia Type:General  Level of Consciousness: awake, alert , and oriented  Airway & Oxygen Therapy: Patient Spontanous Breathing and Patient connected to nasal cannula oxygen  Post-op Assessment: Report given to RN and Post -op Vital signs reviewed and stable  Post vital signs: Reviewed and stable  Last Vitals:  Vitals Value Taken Time  BP 117/75 09/27/24 08:19  Temp 36.5 C 09/27/24 08:19  Pulse 99 09/27/24 08:19  Resp 22 09/27/24 08:19  SpO2 93 % 09/27/24 08:19    Last Pain:  Vitals:   09/27/24 0819  TempSrc: Oral  PainSc:       Patients Stated Pain Goal: 10 (09/27/24 9357)  Complications: No notable events documented.

## 2024-09-27 NOTE — ED Notes (Signed)
 Time out performed with RT, MD, PA, and 2 RN at bedside.   Etomidate  and succs 1251  ETT placement verified with physician by color change and lung sounds.   CXR to follow.  VSS throughout procedure.

## 2024-09-28 ENCOUNTER — Ambulatory Visit: Admitting: Cardiology

## 2024-09-28 ENCOUNTER — Ambulatory Visit (INDEPENDENT_AMBULATORY_CARE_PROVIDER_SITE_OTHER): Payer: Self-pay | Admitting: Gastroenterology

## 2024-09-28 ENCOUNTER — Encounter (INDEPENDENT_AMBULATORY_CARE_PROVIDER_SITE_OTHER): Payer: Self-pay | Admitting: *Deleted

## 2024-09-28 ENCOUNTER — Encounter (HOSPITAL_COMMUNITY): Payer: Self-pay | Admitting: Gastroenterology

## 2024-09-28 ENCOUNTER — Other Ambulatory Visit: Payer: Self-pay | Admitting: Family Medicine

## 2024-09-28 ENCOUNTER — Inpatient Hospital Stay (HOSPITAL_COMMUNITY)

## 2024-09-28 DIAGNOSIS — R578 Other shock: Secondary | ICD-10-CM

## 2024-09-28 LAB — ECHOCARDIOGRAM COMPLETE
Area-P 1/2: 3.22 cm2
Calc EF: 45.1 %
Height: 73 in
S' Lateral: 3.1 cm
Single Plane A2C EF: 47.3 %
Single Plane A4C EF: 40.7 %
Weight: 2927.71 [oz_av]

## 2024-09-28 LAB — URINALYSIS, ROUTINE W REFLEX MICROSCOPIC
Bilirubin Urine: NEGATIVE
Glucose, UA: NEGATIVE mg/dL
Hgb urine dipstick: NEGATIVE
Ketones, ur: NEGATIVE mg/dL
Leukocytes,Ua: NEGATIVE
Nitrite: NEGATIVE
Protein, ur: 30 mg/dL — AB
Specific Gravity, Urine: 1.024 (ref 1.005–1.030)
pH: 5 (ref 5.0–8.0)

## 2024-09-28 LAB — POCT I-STAT 7, (LYTES, BLD GAS, ICA,H+H)
Acid-base deficit: 5 mmol/L — ABNORMAL HIGH (ref 0.0–2.0)
Acid-base deficit: 6 mmol/L — ABNORMAL HIGH (ref 0.0–2.0)
Acid-base deficit: 6 mmol/L — ABNORMAL HIGH (ref 0.0–2.0)
Acid-base deficit: 8 mmol/L — ABNORMAL HIGH (ref 0.0–2.0)
Acid-base deficit: 7 mmol/L — ABNORMAL HIGH (ref 0.0–2.0)
Acid-base deficit: 6 mmol/L — ABNORMAL HIGH (ref 0.0–2.0)
Acid-base deficit: 6 mmol/L — ABNORMAL HIGH (ref 0.0–2.0)
Bicarbonate: 19.9 mmol/L — ABNORMAL LOW (ref 20.0–28.0)
Bicarbonate: 18.1 mmol/L — ABNORMAL LOW (ref 20.0–28.0)
Bicarbonate: 17.7 mmol/L — ABNORMAL LOW (ref 20.0–28.0)
Bicarbonate: 15.5 mmol/L — ABNORMAL LOW (ref 20.0–28.0)
Bicarbonate: 17.1 mmol/L — ABNORMAL LOW (ref 20.0–28.0)
Bicarbonate: 17.7 mmol/L — ABNORMAL LOW (ref 20.0–28.0)
Bicarbonate: 17.7 mmol/L — ABNORMAL LOW (ref 20.0–28.0)
Calcium, Ion: 1.16 mmol/L (ref 1.15–1.40)
Calcium, Ion: 1.13 mmol/L — ABNORMAL LOW (ref 1.15–1.40)
Calcium, Ion: 1.12 mmol/L — ABNORMAL LOW (ref 1.15–1.40)
Calcium, Ion: 1.06 mmol/L — ABNORMAL LOW (ref 1.15–1.40)
Calcium, Ion: 1.14 mmol/L — ABNORMAL LOW (ref 1.15–1.40)
Calcium, Ion: 1.12 mmol/L — ABNORMAL LOW (ref 1.15–1.40)
Calcium, Ion: 1.12 mmol/L — ABNORMAL LOW (ref 1.15–1.40)
HCT: 36 % — ABNORMAL LOW (ref 39.0–52.0)
HCT: 39 % (ref 39.0–52.0)
HCT: 39 % (ref 39.0–52.0)
HCT: 37 % — ABNORMAL LOW (ref 39.0–52.0)
HCT: 41 % (ref 39.0–52.0)
HCT: 41 % (ref 39.0–52.0)
HCT: 41 % (ref 39.0–52.0)
Hemoglobin: 12.2 g/dL — ABNORMAL LOW (ref 13.0–17.0)
Hemoglobin: 13.3 g/dL (ref 13.0–17.0)
Hemoglobin: 13.3 g/dL (ref 13.0–17.0)
Hemoglobin: 12.6 g/dL — ABNORMAL LOW (ref 13.0–17.0)
Hemoglobin: 13.9 g/dL (ref 13.0–17.0)
Hemoglobin: 13.9 g/dL (ref 13.0–17.0)
Hemoglobin: 13.9 g/dL (ref 13.0–17.0)
O2 Saturation: 87 %
O2 Saturation: 92 %
O2 Saturation: 96 %
O2 Saturation: 96 %
O2 Saturation: 96 %
O2 Saturation: 90 %
O2 Saturation: 96 %
Patient temperature: 102.2
Patient temperature: 98.7
Patient temperature: 99.9
Patient temperature: 99.4
Patient temperature: 99.4
Potassium: 3.7 mmol/L (ref 3.5–5.1)
Potassium: 3.8 mmol/L (ref 3.5–5.1)
Potassium: 4 mmol/L (ref 3.5–5.1)
Potassium: 3.8 mmol/L (ref 3.5–5.1)
Potassium: 4.2 mmol/L (ref 3.5–5.1)
Potassium: 4.2 mmol/L (ref 3.5–5.1)
Potassium: 4.1 mmol/L (ref 3.5–5.1)
Sodium: 137 mmol/L (ref 135–145)
Sodium: 133 mmol/L — ABNORMAL LOW (ref 135–145)
Sodium: 133 mmol/L — ABNORMAL LOW (ref 135–145)
Sodium: 134 mmol/L — ABNORMAL LOW (ref 135–145)
Sodium: 134 mmol/L — ABNORMAL LOW (ref 135–145)
Sodium: 132 mmol/L — ABNORMAL LOW (ref 135–145)
Sodium: 132 mmol/L — ABNORMAL LOW (ref 135–145)
TCO2: 21 mmol/L — ABNORMAL LOW (ref 22–32)
TCO2: 19 mmol/L — ABNORMAL LOW (ref 22–32)
TCO2: 19 mmol/L — ABNORMAL LOW (ref 22–32)
TCO2: 16 mmol/L — ABNORMAL LOW (ref 22–32)
TCO2: 18 mmol/L — ABNORMAL LOW (ref 22–32)
TCO2: 19 mmol/L — ABNORMAL LOW (ref 22–32)
TCO2: 19 mmol/L — ABNORMAL LOW (ref 22–32)
pCO2 arterial: 36.2 mmHg (ref 32–48)
pCO2 arterial: 30.2 mmHg — ABNORMAL LOW (ref 32–48)
pCO2 arterial: 32 mmHg (ref 32–48)
pCO2 arterial: 26.4 mmHg — ABNORMAL LOW (ref 32–48)
pCO2 arterial: 30 mmHg — ABNORMAL LOW (ref 32–48)
pCO2 arterial: 31.2 mmHg — ABNORMAL LOW (ref 32–48)
pCO2 arterial: 30 mmHg — ABNORMAL LOW (ref 32–48)
pH, Arterial: 7.348 — ABNORMAL LOW (ref 7.35–7.45)
pH, Arterial: 7.386 (ref 7.35–7.45)
pH, Arterial: 7.36 (ref 7.35–7.45)
pH, Arterial: 7.377 (ref 7.35–7.45)
pH, Arterial: 7.367 (ref 7.35–7.45)
pH, Arterial: 7.363 (ref 7.35–7.45)
pH, Arterial: 7.38 (ref 7.35–7.45)
pO2, Arterial: 56 mmHg — ABNORMAL LOW (ref 83–108)
pO2, Arterial: 65 mmHg — ABNORMAL LOW (ref 83–108)
pO2, Arterial: 91 mmHg (ref 83–108)
pO2, Arterial: 80 mmHg — ABNORMAL LOW (ref 83–108)
pO2, Arterial: 83 mmHg (ref 83–108)
pO2, Arterial: 62 mmHg — ABNORMAL LOW (ref 83–108)
pO2, Arterial: 88 mmHg (ref 83–108)

## 2024-09-28 LAB — CBC
HCT: 23.5 % — ABNORMAL LOW (ref 39.0–52.0)
Hemoglobin: 7.9 g/dL — ABNORMAL LOW (ref 13.0–17.0)
MCH: 28.7 pg (ref 26.0–34.0)
MCHC: 33.6 g/dL (ref 30.0–36.0)
MCV: 85.5 fL (ref 80.0–100.0)
Platelets: 109 K/uL — ABNORMAL LOW (ref 150–400)
RBC: 2.75 MIL/uL — ABNORMAL LOW (ref 4.22–5.81)
RDW: 13.4 % (ref 11.5–15.5)
WBC: 2 K/uL — ABNORMAL LOW (ref 4.0–10.5)
nRBC: 0 % (ref 0.0–0.2)

## 2024-09-28 LAB — GLUCOSE, CAPILLARY
Glucose-Capillary: 185 mg/dL — ABNORMAL HIGH (ref 70–99)
Glucose-Capillary: 133 mg/dL — ABNORMAL HIGH (ref 70–99)
Glucose-Capillary: 132 mg/dL — ABNORMAL HIGH (ref 70–99)
Glucose-Capillary: 120 mg/dL — ABNORMAL HIGH (ref 70–99)
Glucose-Capillary: 134 mg/dL — ABNORMAL HIGH (ref 70–99)

## 2024-09-28 LAB — BASIC METABOLIC PANEL WITH GFR
Anion gap: 13 (ref 5–15)
BUN: 19 mg/dL (ref 8–23)
CO2: 18 mmol/L — ABNORMAL LOW (ref 22–32)
Calcium: 8.2 mg/dL — ABNORMAL LOW (ref 8.9–10.3)
Chloride: 105 mmol/L (ref 98–111)
Creatinine, Ser: 1.83 mg/dL — ABNORMAL HIGH (ref 0.61–1.24)
GFR, Estimated: 39 mL/min — ABNORMAL LOW (ref >60)
Glucose, Bld: 269 mg/dL — ABNORMAL HIGH (ref 70–99)
Potassium: 3.8 mmol/L (ref 3.5–5.1)
Sodium: 136 mmol/L (ref 135–145)

## 2024-09-28 LAB — BRAIN NATRIURETIC PEPTIDE: B Natriuretic Peptide: 251 pg/mL — ABNORMAL HIGH (ref 0.0–100.0)

## 2024-09-28 LAB — COOXEMETRY PANEL
Carboxyhemoglobin: 1.3 % (ref 0.5–1.5)
Methemoglobin: <0.7 % (ref 0.0–1.5)
O2 Saturation: 78 %
Total hemoglobin: 14 g/dL (ref 12.0–16.0)

## 2024-09-28 LAB — SURGICAL PATHOLOGY

## 2024-09-28 LAB — LACTIC ACID, PLASMA: Lactic Acid, Venous: 4.2 mmol/L (ref 0.5–1.9)

## 2024-09-28 LAB — TRIGLYCERIDES: Triglycerides: 93 mg/dL (ref <150)

## 2024-09-28 MED ORDER — VANCOMYCIN HCL IN DEXTROSE 1-5 GM/200ML-% IV SOLN: 1000.0000 mg | INTRAVENOUS | Status: DC

## 2024-09-28 MED ORDER — VANCOMYCIN HCL 1750 MG/350ML IV SOLN
1750.0000 mg | Freq: Once | INTRAVENOUS | Status: AC
  Administered 2024-09-28: 1750 mg via INTRAVENOUS
  Filled 2024-09-28: qty 350

## 2024-09-28 MED ORDER — PERFLUTREN LIPID MICROSPHERE
1.0000 mL | INTRAVENOUS | Status: AC | PRN
  Administered 2024-09-28: 2 mL via INTRAVENOUS

## 2024-09-28 MED ORDER — INSULIN ASPART 100 UNIT/ML IJ SOLN
0.0000 [IU] | INTRAMUSCULAR | Status: DC
  Administered 2024-09-28 (×2): 3 [IU] via SUBCUTANEOUS
  Administered 2024-09-28: 4 [IU] via SUBCUTANEOUS
  Administered 2024-09-28 – 2024-09-29 (×5): 3 [IU] via SUBCUTANEOUS
  Administered 2024-09-30: 4 [IU] via SUBCUTANEOUS
  Administered 2024-09-30: 7 [IU] via SUBCUTANEOUS
  Administered 2024-09-30 (×3): 3 [IU] via SUBCUTANEOUS
  Administered 2024-09-30: 4 [IU] via SUBCUTANEOUS
  Administered 2024-10-01: 11 [IU] via SUBCUTANEOUS
  Administered 2024-10-01 (×2): 4 [IU] via SUBCUTANEOUS
  Administered 2024-10-01: 3 [IU] via SUBCUTANEOUS
  Administered 2024-10-01: 4 [IU] via SUBCUTANEOUS
  Administered 2024-10-02 (×2): 3 [IU] via SUBCUTANEOUS
  Administered 2024-10-02: 7 [IU] via SUBCUTANEOUS
  Administered 2024-10-02: 4 [IU] via SUBCUTANEOUS
  Administered 2024-10-02: 7 [IU] via SUBCUTANEOUS
  Administered 2024-10-02: 3 [IU] via SUBCUTANEOUS
  Administered 2024-10-03 (×2): 7 [IU] via SUBCUTANEOUS
  Administered 2024-10-03 (×3): 4 [IU] via SUBCUTANEOUS
  Administered 2024-10-03: 7 [IU] via SUBCUTANEOUS
  Administered 2024-10-04 (×3): 4 [IU] via SUBCUTANEOUS
  Administered 2024-10-04: 3 [IU] via SUBCUTANEOUS
  Administered 2024-10-04: 2 [IU] via SUBCUTANEOUS
  Administered 2024-10-05: 3 [IU] via SUBCUTANEOUS
  Administered 2024-10-05: 7 [IU] via SUBCUTANEOUS
  Administered 2024-10-05: 4 [IU] via SUBCUTANEOUS
  Administered 2024-10-05: 3 [IU] via SUBCUTANEOUS
  Administered 2024-10-05: 4 [IU] via SUBCUTANEOUS
  Administered 2024-10-05: 3 [IU] via SUBCUTANEOUS
  Administered 2024-10-05: 2 [IU] via SUBCUTANEOUS
  Administered 2024-10-06: 4 [IU] via SUBCUTANEOUS
  Administered 2024-10-06 (×3): 3 [IU] via SUBCUTANEOUS
  Administered 2024-10-07 (×2): 4 [IU] via SUBCUTANEOUS
  Administered 2024-10-07: 3 [IU] via SUBCUTANEOUS
  Administered 2024-10-07: 4 [IU] via SUBCUTANEOUS
  Administered 2024-10-08 – 2024-10-09 (×7): 3 [IU] via SUBCUTANEOUS
  Administered 2024-10-09: 4 [IU] via SUBCUTANEOUS
  Administered 2024-10-09 – 2024-10-10 (×2): 3 [IU] via SUBCUTANEOUS
  Filled 2024-09-28: qty 3
  Filled 2024-09-28: qty 7
  Filled 2024-09-28: qty 3
  Filled 2024-09-28: qty 7
  Filled 2024-09-28: qty 2
  Filled 2024-09-28 (×3): qty 3
  Filled 2024-09-28: qty 2
  Filled 2024-09-28: qty 7
  Filled 2024-09-28: qty 3
  Filled 2024-09-28: qty 7
  Filled 2024-09-28: qty 4
  Filled 2024-09-28: qty 3
  Filled 2024-09-28 (×6): qty 4
  Filled 2024-09-28: qty 2
  Filled 2024-09-28 (×2): qty 3
  Filled 2024-09-28 (×2): qty 4
  Filled 2024-09-28 (×3): qty 3
  Filled 2024-09-28: qty 4
  Filled 2024-09-28: qty 2
  Filled 2024-09-28 (×2): qty 4
  Filled 2024-09-28: qty 7
  Filled 2024-09-28: qty 4
  Filled 2024-09-28 (×3): qty 3
  Filled 2024-09-28: qty 7
  Filled 2024-09-28 (×3): qty 3
  Filled 2024-09-28 (×2): qty 4
  Filled 2024-09-28: qty 7
  Filled 2024-09-28 (×2): qty 4
  Filled 2024-09-28 (×3): qty 3
  Filled 2024-09-28: qty 4
  Filled 2024-09-28 (×3): qty 3
  Filled 2024-09-28: qty 4
  Filled 2024-09-28: qty 11
  Filled 2024-09-28: qty 3
  Filled 2024-09-28: qty 4
  Filled 2024-09-28 (×2): qty 3
  Filled 2024-09-28: qty 4

## 2024-09-28 MED ORDER — DEXAMETHASONE SOD PHOSPHATE PF 10 MG/ML IJ SOLN
10.0000 mg | INTRAMUSCULAR | Status: DC
  Administered 2024-10-03 – 2024-10-04 (×2): 10 mg via INTRAVENOUS
  Filled 2024-09-28 (×3): qty 1

## 2024-09-28 MED ORDER — DEXAMETHASONE SOD PHOSPHATE PF 10 MG/ML IJ SOLN
20.0000 mg | INTRAMUSCULAR | Status: AC
  Administered 2024-09-28 – 2024-10-02 (×5): 20 mg via INTRAVENOUS
  Filled 2024-09-28 (×5): qty 2

## 2024-09-28 MED ORDER — STERILE WATER FOR INJECTION IJ SOLN: 20.0000 ng/kg/min | INTRAVENOUS | Status: DC

## 2024-09-28 MED ORDER — NOREPINEPHRINE 16 MG/250ML-% IV SOLN
0.0000 ug/min | INTRAVENOUS | Status: DC
  Administered 2024-09-28: 34 ug/min via INTRAVENOUS
  Administered 2024-09-29: 18 ug/min via INTRAVENOUS
  Administered 2024-09-30: 10 ug/min via INTRAVENOUS
  Filled 2024-09-28 (×2): qty 250
  Filled 2024-09-28: qty 500
  Filled 2024-09-28 (×2): qty 250

## 2024-09-28 MED ORDER — VASOPRESSIN 20 UNITS/100 ML INFUSION FOR SHOCK
0.0000 [IU]/min | INTRAVENOUS | Status: DC
  Administered 2024-09-28 – 2024-09-30 (×6): 0.03 [IU]/min via INTRAVENOUS
  Filled 2024-09-28 (×5): qty 100

## 2024-09-28 MED ORDER — STERILE WATER FOR INJECTION IJ SOLN
40.0000 ng/kg/min | INTRAVENOUS | Status: DC
  Administered 2024-09-28 (×2): 50 ng/kg/min via RESPIRATORY_TRACT
  Filled 2024-09-28 (×3): qty 5

## 2024-09-28 MED ORDER — PIPERACILLIN-TAZOBACTAM 3.375 G IVPB
3.3750 g | Freq: Three times a day (TID) | INTRAVENOUS | Status: DC
  Administered 2024-09-28 – 2024-10-01 (×10): 3.375 g via INTRAVENOUS
  Filled 2024-09-28 (×10): qty 50

## 2024-09-28 MED ORDER — PANTOPRAZOLE SODIUM 40 MG IV SOLR
40.0000 mg | INTRAVENOUS | Status: DC
  Administered 2024-09-28 – 2024-10-04 (×7): 40 mg via INTRAVENOUS
  Filled 2024-09-28 (×7): qty 10

## 2024-09-28 MED ORDER — SODIUM BICARBONATE 8.4 % IV SOLN
INTRAVENOUS | Status: AC
  Filled 2024-09-28: qty 100

## 2024-09-28 NOTE — Progress Notes (Addendum)
 NAME:  Bryce Martinez, MRN:  [id number], DOB:  06/18/1951, LOS: 1 ADMISSION DATE:  09/27/2024, CONSULTATION DATE:  11/24 REFERRING MD:  EDP CHIEF COMPLAINT:  ARDS   History of Present Illness:  Bryce Martinez is a 73 year old male with a pertinent past medical history of hypertension, hyperlipidemia, arthritis, GERD, and history of  hepatitis C s/p treatment and sustained virologic response with advanced fibrosis who presented to [facility] this morning for a routine screening colonoscopy. During the procedure, it was noted by anesthesia and gastroenterology that the patient had a clear/slightly bilious emesis during the procedure. He has had prior colonoscopies without complication. He was monitored after the procedure to be saturating 93% on room air.  Upon discharge, patient denied pain, SOB, or cough. Shortly after returning home, the patient noticed pain to the middle of his back and abdomen, felt SOB, and could not breathe deeply. Took 2 Pepto Bismol without relief. Pt's wife was advised to take him to [facility] ED, where he was placed on NRB at 15L and SpO2 remained at 92%.  CXR was done and showed pneumonia in the left lung and possible pleural effusion. Patient then placed on BiPAP and HFNC, however remained dyspneic and tachypneic. Denies further N/V since procedure. Patient was ultimately intubated for airway.   PCCM was consulted for transfer to [facility] and admission for further management. Pertinent  Medical History   Past Medical History:  Diagnosis Date   Allergy    Arthritis    ED (erectile dysfunction)    GERD (gastroesophageal reflux disease)    Occasionally   Hepatitis C    Hyperlipidemia    Hypertension    Significant Hospital Events: Including procedures, antibiotic start and stop dates in addition to other pertinent events   11/24 -Colonoscopy at [facility] with Dr. Eartha Flavors, postop O2 requirements showing 93% on RA at discharge. 11/24- Trialed 15L  NRB, BiPAP without respiratory improvement. PCCM consulted for admission and worsening respiratory status,  intubated in [facility] ED.  11/24- Left radial arterial line placed  11/24- 2030, CVC was placed for increased Levophed  requirements  Interim History / Subjective:  2030- Received a right internal jugular CVC for increasing Levophed  requirements.  0200-Levophed  at 32mcg overnight. Gotten fluids by 0200 and BP was 95/57. Vasopressin  added.  Patient also COVID positive, asymptomatic.  0235- SBP dropped to 50s, Levophed  titrated up to 55 max, 1 amp of bicarb given, stat ABG ordered, sedation paused. BP recovered. FiO2 increased and EPO ordered. ABG showing similar to ABG on presentation pH 7.348 pCO2 36.2  pO2 56 bicarb 19.9, anion gap 12.1.  0715-Levophed  now to . Spiked fever of 100.7-101F on repeat check. Given Tylenol  at 0710.  Objective   Blood pressure (!) 143/87, pulse (!) 120, temperature (!) 102.2 F (39 C), temperature source Oral, resp. rate (!) 23, height 6' 1 (1.854 m), weight 83 kg, SpO2 100%.    Vent Mode: PRVC FiO2 (%):  [60 %-100 %] 70 % Set Rate:  [18 bmp-24 bmp] 24 bmp Vt Set:  [500 mL-630 mL] 500 mL PEEP:  [6 cmH20-10 cmH20] 10 cmH20 Plateau Pressure:  [17 cmH20-30 cmH20] 28 cmH20   Intake/Output Summary (Last 24 hours) at 09/28/2024 1033 Last data filed at 09/28/2024 0900 Gross per 24 hour  Intake 7004.77 ml  Output 1650 ml  Net 5354.77 ml   Filed Weights   09/27/24 1034 09/28/24 0500  Weight: 81.2 kg 83 kg   Examination: General: toxic appearing male,  NAD HENT: [place], ETT in place with bloody secretions. OG tube in place in right nare Lungs: MV, lower left lung field with coarse breath sounds. No crackles or wheezing. Cardiovascular: tachycardic, no murmurs/rubs/gallops, no JVD Abdomen: soft, bowel sounds negative, nondistended, no rigidity or masses. Extremities: Bilateral UE/LE cool to touch, no edema. DP/PT pulses bilaterally diminished.  Radial pulses 2+. Neuro: intubated and sedated. Not responsive to voice, touch, or noxious stimuli.  GU: Foley placed  Pertinent labs, imaging, and tests in the past 24 hours: Na: 136 K: 3.8 BUN/Cr: 19/1.83 (baseline 1.16-1.18) Cl: 100 BG: 186 > 144 H/H: 15.3/48.0 WBC: 4.9 Hs-troponins: 15 > 21 Lipase 26 Lactic acid: 5.0 > 5.5 > 4.1 > 4.2 Pro-BNP 62.7   ABG: pH 7.25 pCO2 38 pO2 59  bicarb 16.7, anion gap 19.3 ABG: pH 7.31 pCO2 36 pO2 57 bicarb 18.1, anion gap 17.9 ABG 11/25 0238 pH 7.348 pCO2 36.2 pO2 56 bicarb 19.9, anion gap 12.1 ABG 11/25 0500 pH 7.386 pCO2 30.2 pO2 65 bicarb 18.1, anion gap 9.9  CXR overnight showing improving presentation to prior CXR of patchy bilateral airspace disease with L > R, indicative of pneumonia, edema, or aspiration. No PTX. No pleural effusion.   Resolved problem list   Assessment and Plan   Respiratory Acute Hypoxic Respiratory Failure 2/2 Severe Acute Respiratory Distress Syndrome from Acute Aspiration Pneumonia P: - Patient vomited during colonoscopy while in left lateral decubitus position, likely aspirating on emesis. Patient also incidentally found to be COVID positive on respiratory viral panel, however was asymptomatic on presentation.  Intubated at [facility] ED on 11/24. - PaO2/FiO2 < 100 with PEEP =10, indicating severe ARDS - Vent settings FIO2 70%, PEEP 10, TV 630, maintained overnight. EPO given overnight for low oxygenation while on these settings. - Maintain lung protection ventilation of 6cc/kg of ideal body weight to prevent volutrauma or barotrauma, reduced TV to 500 and increased RR to 24. PEEP at 10. - Will taper down EPO q2 hr until completely off - Consider proning patient if FiO2/PEEP/EPO is maxed out and no clinical improvement in oxygenation - Will start stress dose steroids with 5 days of Decadron  20mg , followed by 5 days of Decadron  10mg  to cover for ARDS and septic shock - VAP protocol - SAT and SBT when  appropriate - Switched IV Unasyn  (D1 11/24 - 11/25) to IV Zosyn  and Vancomycin  for broadened aspiration pneumonia coverage. Trachea aspirate growing rare gram negative rods, patient febrile. - MRSA nares negative - Bcx collected showing NGTD x 24hrs  Infection History of Hepatitis C with Advanced Fibrosis P: - s/p treatment  - Currently fibrotic per elastography 02/2023. RUQ US  on 03/2024 WNL. - Continue to follow outpatient with GI, already established.  Cardiac Shock, presumed likely distributive 2/2 sepsis from aspiration pneumonia and hypovolemia P: - On Levophed  and Vasopressin  0.47mcg, will wean as tolerated to maintain MAP > 65.  - Maintain Propofol  for sedation - 1 amp bicarb added overnight - Likely intravascularly depleted due to colonoscopy bowel prep preceding admission, - Will get cooximetry to further evaluate hypoxemia and tissue perfusion - Will stop LR infusion today, as patient was net positive 5L   History of Hypertension P: - Hold home Losartan  100mg  and Spironolactone  25mg  daily while intubated  Renal AKI P: - Likely prerenal in the setting of combined renal hypoperfusion and initiation of pressor support - Will order UA today  Metabolic Anion Gap Metabolic Acidosis 2/2 Lactic Acidosis, improving  P: - Likely secondary  to lactic acidosis from tissue hypoperfusion, - Anion gap improving on repeat ABGs - Lactic acid 5.0 > 5.5 > 4.1 > 4.2  - Trend daily BMP - Will get echocardiogram to assess LVEF, pending for today   - pro-BNP 62.7 - Fluid responsive, received   Endocrine: Type 2 Diabetes Mellitus P: -SSI  -Long-acting insulin  q4hr -CBG q 4hr  Tubes/Lines/Therapy Tubes: ETT, OG tube, foley catheter Lines: Right Triple-Lumen internal jugular CVC, Left radial arterial line, 3 PIVs (2 right, 1 left) DVT Ppx: [place] Heparin  GI Ppx: Pantroprazole 40mg  daily  Labs   CBC: Recent Labs  Lab 09/27/24 1106 09/28/24 0238 09/28/24 0458  WBC  4.9 2.0*  --   NEUTROABS 3.7  --   --   HGB 15.3 7.9*  12.2* 13.3  HCT 48.0 23.5*  36.0* 39.0  MCV 87.6 85.5  --   PLT 275 109*  --     Basic Metabolic Panel: Recent Labs  Lab 09/27/24 1106 09/28/24 0238 09/28/24 0347 09/28/24 0458  NA 136 137 136 133*  K 3.7 3.7 3.8 3.8  CL 100  --  105  --   CO2 22  --  18*  --   GLUCOSE 186*  --  269*  --   BUN 20  --  19  --   CREATININE 1.26*  --  1.83*  --   CALCIUM  9.9  --  8.2*  --    GFR: Estimated Creatinine Clearance: 41.2 mL/min (A) (by C-G formula based on SCr of 1.83 mg/dL (H)). Recent Labs  Lab 09/27/24 1106 09/27/24 1157 09/27/24 1627 09/28/24 0238 09/28/24 0351  WBC 4.9  --   --  2.0*  --   LATICACIDVEN 5.0* 5.5* 4.1*  --  4.2*    Liver Function Tests: Recent Labs  Lab 09/27/24 1106  AST 31  ALT 37  ALKPHOS 61  BILITOT 2.0*  PROT 8.1  ALBUMIN 4.7   Recent Labs  Lab 09/27/24 1106  LIPASE 26   No results for input(s): AMMONIA in the last 168 hours.  ABG    Component Value Date/Time   PHART 7.386 09/28/2024 0458   PCO2ART 30.2 (L) 09/28/2024 0458   PO2ART 65 (L) 09/28/2024 0458   HCO3 18.1 (L) 09/28/2024 0458   TCO2 19 (L) 09/28/2024 0458   ACIDBASEDEF 6.0 (H) 09/28/2024 0458   O2SAT 92 09/28/2024 0458     Coagulation Profile: No results for input(s): INR, PROTIME in the last 168 hours.  Cardiac Enzymes: No results for input(s): CKTOTAL, CKMB, CKMBINDEX, TROPONINI in the last 168 hours.  HbA1C: Hgb A1c MFr Bld  Date/Time Value Ref Range Status  09/16/2017 09:58 AM 5.5 4.8 - 5.6 % Final    Comment:             Prediabetes: 5.7 - 6.4          Diabetes: >6.4          Glycemic control for adults with diabetes: <7.0   05/20/2015 10:10 AM 5.8 (H) <5.7 % Final    Comment:                                                                           According  to the ADA Clinical Practice Recommendations for 2011, when HbA1c is used as a screening test:     >=6.5%   Diagnostic of  Diabetes Mellitus            (if abnormal result is confirmed)   5.7-6.4%   Increased risk of developing Diabetes Mellitus   References:Diagnosis and Classification of Diabetes Mellitus,Diabetes Care,2011,34(Suppl 1):S62-S69 and Standards of Medical Care in         Diabetes - 2011,Diabetes Care,2011,34 (Suppl 1):S11-S61.       CBG: Recent Labs  Lab 09/27/24 1508 09/28/24 0749  GLUCAP 144* 185*    Past Medical History:  He,  has a past medical history of Allergy, Arthritis, ED (erectile dysfunction), GERD (gastroesophageal reflux disease), Hepatitis C, Hyperlipidemia, and Hypertension.   Surgical History:   Past Surgical History:  Procedure Laterality Date   APPENDECTOMY     CHOLECYSTECTOMY N/A 04/25/2014   Procedure: LAPAROSCOPIC CHOLECYSTECTOMY;  Surgeon: Oneil DELENA Budge, MD;  Location: [facility] ORS;  Service: General;  Laterality: N/A;   COLONOSCOPY N/A 09/01/2019   Procedure: COLONOSCOPY;  Surgeon: Golda Claudis PENNER, MD;  Location: [facility] ENDO SUITE;  Service: Endoscopy;  Laterality: N/A;  930   HEMICOLECTOMY Right    lap hand assisted   POLYPECTOMY  09/01/2019   Procedure: POLYPECTOMY;  Surgeon: Golda Claudis PENNER, MD;  Location: [facility] END[facility] SUITE;  Service: Endoscopy;;  colon   right shoulder surgery Right 03/2005   rotator cuff repair     Social History:   reports that he quit smoking about 52 years ago. His smoking use included cigarettes. He started smoking about 67 years ago. He has a 4.4 pack-year smoking history. He has been exposed to tobacco smoke. He has never used smokeless tobacco. He reports that he does not currently use alcohol. He reports that he does not use drugs.   Family History:  His family history includes Cancer in his father; Diabetes in his brother; Drug abuse in his brother; Hypertension in his brother, daughter, mother, sister, and son.   Allergies Allergies  Allergen Reactions   Amlodipine  Other (See Comments)    unknown     Home Medications  Prior to  Admission medications   Medication Sig Start Date End Date Taking? Authorizing Provider  losartan  (COZAAR ) 100 MG tablet TAKE 1 TABLET BY MOUTH EVERY DAY 03/24/24   Antonetta Rollene BRAVO, MD  meclizine  (ANTIVERT ) 25 MG tablet Take 1 tablet (25 mg total) by mouth 2 (two) times daily as needed for dizziness. 08/24/24   Tobie Suzzane POUR, MD  Misc. Devices MISC Blood pressure cuff/device - 1. ICD10: I10 08/24/24   Tobie Suzzane POUR, MD  spironolactone  (ALDACTONE ) 25 MG tablet TAKE 1 TABLET (25 MG TOTAL) BY MOUTH DAILY. 06/24/24   Antonetta Rollene BRAVO, MD     Critical care time: 43    Chavie Kolinski, DO Internal Medicine Resident, PGY-1 Please contact the on call pager at [phone] for any urgent or emergent needs. 10:33 AM 09/28/2024

## 2024-09-28 NOTE — Progress Notes (Signed)
  Echocardiogram 2D Echocardiogram has been performed.  Devora Ellouise SAUNDERS 09/28/2024, 11:58 AM

## 2024-09-28 NOTE — Progress Notes (Signed)
 eLink Physician-Brief Progress Note Patient Name: Bryce Martinez DOB: 09/25/1951 MRN: [id number]   Date of Service  09/28/2024  HPI/Events of Note  Patient had a drop in blood pressure to SBP 50's   eICU Interventions  Levophed  rapidly titrated up, one amp of Bicarb pushed, stat ABG ordered to check for acidosis, sedation was paused. Blood pressure has recovered.        Ryanna Teschner U Marshawn Ninneman 09/28/2024, 2:35 AM

## 2024-09-28 NOTE — Progress Notes (Signed)
 eLink Physician-Brief Progress Note Patient Name: Bryce Martinez DOB: 03-06-1951 MRN: [id number]   Date of Service  09/28/2024  HPI/Events of Note  Patient on 32 mcg of Levophed . He has had 4300 ml of iv fluids per RN, BP 95/57.  eICU Interventions  Vasopressin  added. CVP check, BNP check, Patient is incidentally Covid positive but was completely asymptomatic when he had the colonoscopy and developed symptoms post-vomiting and aspirating.        Everest Hacking U Leshia Kope 09/28/2024, 1:49 AM

## 2024-09-28 NOTE — Progress Notes (Incomplete)
 Initial Nutrition Assessment  DOCUMENTATION CODES:  Not applicable  INTERVENTION:  Initiate tube feeding via OGT. Start at trickle of 20mL/h and hold to monitor for tolerance. Goal recommendations are below: Vital 1.5 at 60 ml/h (1440 ml per day) When able, advance to 30 and continue to advance by 10mL every 8 hours to reach goal Prosource TF20 60 ml 1x/d Provides 2240 kcal, 117 gm protein, 1100 ml free water  daily Consider exchanging OGT for cortrak on 11/28  NUTRITION DIAGNOSIS:  Increased nutrient needs related to acute illness as evidenced by estimated needs.  GOAL:  Patient will meet greater than or equal to 90% of their needs  MONITOR:  TF tolerance, Vent status, I & O's, Labs  REASON FOR ASSESSMENT:  Consult Enteral/tube feeding initiation and management  ASSESSMENT:  Pt with hx of of HTN, HLD, GERD and hx of hepatitis C presented to ED with SOB after vomiting after an outpatient procedure earlier in the day. Workup consistent with ARDS, intubated in ED.  11/24 - Presented to [facility], intubated, transferred to [facility] ICU   Patient is currently intubated on ventilator support, deeply sedated today. Wife at bedside able to provide a nutrition hx. Reports that pt is generally pretty healthy and has a good appetite. Eats regular meals and is mindful of what he is taking in. Reports that his weight has been stable with no major changes recently.   Pt still has a part time job and was doing his regular level of activity prior to going in for the colonscopy.   Discussed with team. Pt more stable today and OGT to LIWS. Ok to begin trickle feeds. If pt tolerates, could consider exchanging for cortrak tube in the event that pt is extubated and needs some time to recover swallowing function and appetite after critical illness.   MV: 12 L/min Temp (24hrs), Avg:99.6 F (37.6 C), Min:98.5 F (36.9 C), Max:100.1 F (37.8 C) MAP (art line): 99-156mmHg  Propofol : 17.05  ml/hr (450 kcal/d at current infusion)  Admit weight: 81.2 kg   Current weight: 82.1 kg   Intake/Output Summary (Last 24 hours) at 09/29/2024 1213 Last data filed at 09/29/2024 1100 Gross per 24 hour  Intake 2188 ml  Output 2260 ml  Net -72 ml  Net IO Since Admission: 5,805.54 mL [09/29/24 1213]  Drains/Lines: Art Line left radial CVC Triple Lumen, right IJ OGT (gastric) output since admission. UOP x 24 hours  Nutritionally Relevant Medications: Scheduled Meds:  dexamethasone    20 mg Intravenous Q24H   docusate  100 mg Per Tube BID   insulin  aspart  0-20 Units Subcutaneous Q4H   pantoprazole  IV  40 mg Intravenous Q24H   polyethylene glycol  17 g Per Tube Daily   Continuous Infusions:  norepinephrine  (LEVOPHED ) Adult infusion 1 mcg/min (09/29/24 0700)   piperacillin -tazobactam (ZOSYN )  IV 12.5 mL/hr at 09/29/24 0700   propofol  (DIPRIVAN ) infusion 35 mcg/kg/min (09/29/24 0700)   vasopressin  0.03 Units/min (09/29/24 0700)   Labs Reviewed: Sodium 134 CO2 19 Creatinine 1.47 CBG ranges from 120-185 mg/dL over the last 24 hours  NUTRITION - FOCUSED PHYSICAL EXAM: Flowsheet Row Most Recent Value  Orbital Region Mild depletion  Upper Arm Region No depletion  Thoracic and Lumbar Region No depletion  Buccal Region No depletion  Temple Region Mild depletion  Clavicle Bone Region No depletion  Clavicle and Acromion Bone Region Mild depletion  Scapular Bone Region No depletion  Dorsal Hand No depletion  Patellar Region No  depletion  Anterior Thigh Region No depletion  Posterior Calf Region No depletion  Edema (RD Assessment) None  Hair Reviewed  Eyes Reviewed  Mouth Reviewed  Skin Reviewed  Nails Reviewed    Diet Order:   Diet Order             Diet NPO time specified  Diet effective now                   EDUCATION NEEDS:  Education needs have been addressed  Skin:  Skin Assessment: Reviewed RN Assessment  Last BM:  PTA  Height:  Ht  Readings from Last 1 Encounters:  09/27/24 6' 1 (1.854 m)    Weight:  Wt Readings from Last 1 Encounters:  09/29/24 82.1 kg    Ideal Body Weight:  83.6 kg  BMI:  Body mass index is 23.88 kg/m.  Estimated Nutritional Needs:  Kcal:  2200-2500 kcal/d Protein:  110-125g/d Fluid:  2.5L/d    Vernell Lukes, RD, LDN, CNSC Registered Dietitian II Please reach out via secure chat

## 2024-09-28 NOTE — Progress Notes (Signed)
 Pharmacy Antibiotic Note  Bryce Martinez is a 73 y.o. male admitted on 09/27/2024 with pneumonia.  Pharmacy has been consulted for vancomycin  dosing.  Scr worsening 1.26 to 1.83.   Plan: Vancomycin  1750 mg x1 followed by 1000 mg q24h for AUC of 433  Height: 6' 1 (185.4 cm) Weight: 83 kg (182 lb 15.7 oz) IBW/kg (Calculated) : 79.9  Temp (24hrs), Avg:100.2 F (37.9 C), Min:97.8 F (36.6 C), Max:102.2 F (39 C)  Recent Labs  Lab 09/27/24 1106 09/27/24 1157 09/27/24 1627 09/28/24 0238 09/28/24 0347 09/28/24 0351  WBC 4.9  --   --  2.0*  --   --   CREATININE 1.26*  --   --   --  1.83*  --   LATICACIDVEN 5.0* 5.5* 4.1*  --   --  4.2*    Estimated Creatinine Clearance: 41.2 mL/min (A) (by C-G formula based on SCr of 1.83 mg/dL (H)).    Allergies  Allergen Reactions   Amlodipine  Other (See Comments)    unknown    Antimicrobials this admission: Unasyn  11/24  Zosyn  11/25 >>  Vanco 11/25 >>   Microbiology results: Blood cx 11/24 NGTD <24 h Resp cx 11/24 GNR  MRSA Pcr neg  COVID +   Thank you for allowing pharmacy to be a part of this patient's care.  Rankin Sams 09/28/2024 8:51 AM

## 2024-09-28 NOTE — Progress Notes (Signed)
 eLink Physician-Brief Progress Note Patient Name: Bryce Martinez DOB: 1950/12/14 MRN: [id number]   Date of Service  09/28/2024  HPI/Events of Note  ABG reviewed.  eICU Interventions  FiO2 increased and EPO ordered. Hemodynamic instability precludes proning at this time.     Intervention Category Major Interventions: Hypotension - evaluation and management  Nazaria Riesen U Cliffard Hair 09/28/2024, 2:40 AM

## 2024-09-29 DIAGNOSIS — I5021 Acute systolic (congestive) heart failure: Secondary | ICD-10-CM

## 2024-09-29 DIAGNOSIS — J80 Acute respiratory distress syndrome: Secondary | ICD-10-CM | POA: Diagnosis not present

## 2024-09-29 DIAGNOSIS — R6521 Severe sepsis with septic shock: Secondary | ICD-10-CM

## 2024-09-29 DIAGNOSIS — A419 Sepsis, unspecified organism: Secondary | ICD-10-CM | POA: Diagnosis not present

## 2024-09-29 LAB — POCT I-STAT 7, (LYTES, BLD GAS, ICA,H+H)
Acid-base deficit: 4 mmol/L — ABNORMAL HIGH (ref 0.0–2.0)
Bicarbonate: 19.4 mmol/L — ABNORMAL LOW (ref 20.0–28.0)
Calcium, Ion: 1.06 mmol/L — ABNORMAL LOW (ref 1.15–1.40)
HCT: 41 % (ref 39.0–52.0)
Hemoglobin: 13.9 g/dL (ref 13.0–17.0)
O2 Saturation: 97 %
Potassium: 3.8 mmol/L (ref 3.5–5.1)
Sodium: 134 mmol/L — ABNORMAL LOW (ref 135–145)
TCO2: 20 mmol/L — ABNORMAL LOW (ref 22–32)
pCO2 arterial: 30.8 mmHg — ABNORMAL LOW (ref 32–48)
pH, Arterial: 7.408 (ref 7.35–7.45)
pO2, Arterial: 84 mmHg (ref 83–108)

## 2024-09-29 LAB — COMPREHENSIVE METABOLIC PANEL WITH GFR
ALT: 25 U/L (ref 0–44)
AST: 46 U/L — ABNORMAL HIGH (ref 15–41)
Albumin: 2.5 g/dL — ABNORMAL LOW (ref 3.5–5.0)
Alkaline Phosphatase: 40 U/L (ref 38–126)
Anion gap: 13 (ref 5–15)
BUN: 20 mg/dL (ref 8–23)
CO2: 19 mmol/L — ABNORMAL LOW (ref 22–32)
Calcium: 8 mg/dL — ABNORMAL LOW (ref 8.9–10.3)
Chloride: 102 mmol/L (ref 98–111)
Creatinine, Ser: 1.47 mg/dL — ABNORMAL HIGH (ref 0.61–1.24)
GFR, Estimated: 50 mL/min — ABNORMAL LOW (ref >60)
Glucose, Bld: 130 mg/dL — ABNORMAL HIGH (ref 70–99)
Potassium: 3.9 mmol/L (ref 3.5–5.1)
Sodium: 134 mmol/L — ABNORMAL LOW (ref 135–145)
Total Bilirubin: 2 mg/dL — ABNORMAL HIGH (ref 0.0–1.2)
Total Protein: 5.8 g/dL — ABNORMAL LOW (ref 6.5–8.1)

## 2024-09-29 LAB — GLUCOSE, CAPILLARY
Glucose-Capillary: 138 mg/dL — ABNORMAL HIGH (ref 70–99)
Glucose-Capillary: 129 mg/dL — ABNORMAL HIGH (ref 70–99)
Glucose-Capillary: 116 mg/dL — ABNORMAL HIGH (ref 70–99)
Glucose-Capillary: 114 mg/dL — ABNORMAL HIGH (ref 70–99)
Glucose-Capillary: 144 mg/dL — ABNORMAL HIGH (ref 70–99)
Glucose-Capillary: 137 mg/dL — ABNORMAL HIGH (ref 70–99)

## 2024-09-29 LAB — PHOSPHORUS
Phosphorus: 2.4 mg/dL — ABNORMAL LOW (ref 2.5–4.6)
Phosphorus: 2.1 mg/dL — ABNORMAL LOW (ref 2.5–4.6)

## 2024-09-29 LAB — MAGNESIUM
Magnesium: 1.4 mg/dL — ABNORMAL LOW (ref 1.7–2.4)
Magnesium: 1.6 mg/dL — ABNORMAL LOW (ref 1.7–2.4)

## 2024-09-29 LAB — CG4 I-STAT (LACTIC ACID)
Lactic Acid, Venous: 4.8 mmol/L (ref 0.5–1.9)
Lactic Acid, Venous: 4.8 mmol/L (ref 0.5–1.9)

## 2024-09-29 LAB — CBC
HCT: 39.5 % (ref 39.0–52.0)
Hemoglobin: 13.7 g/dL (ref 13.0–17.0)
MCH: 28.1 pg (ref 26.0–34.0)
MCHC: 34.7 g/dL (ref 30.0–36.0)
MCV: 80.9 fL (ref 80.0–100.0)
Platelets: 152 K/uL (ref 150–400)
RBC: 4.88 MIL/uL (ref 4.22–5.81)
RDW: 13.5 % (ref 11.5–15.5)
WBC: 17.7 K/uL — ABNORMAL HIGH (ref 4.0–10.5)
nRBC: 0 % (ref 0.0–0.2)

## 2024-09-29 LAB — POTASSIUM: Potassium: 3.6 mmol/L (ref 3.5–5.1)

## 2024-09-29 MED ORDER — POTASSIUM PHOSPHATES 15 MMOLE/5ML IV SOLN
15.0000 mmol | Freq: Once | INTRAVENOUS | Status: AC
  Administered 2024-09-30: 15 mmol via INTRAVENOUS
  Filled 2024-09-29: qty 5

## 2024-09-29 MED ORDER — MAGNESIUM SULFATE 4 GM/100ML IV SOLN
4.0000 g | Freq: Once | INTRAVENOUS | Status: AC
  Administered 2024-09-30: 4 g via INTRAVENOUS
  Filled 2024-09-29: qty 100

## 2024-09-29 MED ORDER — PROSOURCE TF20 ENFIT COMPATIBL EN LIQD
60.0000 mL | Freq: Every day | ENTERAL | Status: DC
  Administered 2024-09-29 – 2024-10-09 (×11): 60 mL
  Filled 2024-09-29 (×11): qty 60

## 2024-09-29 MED ORDER — FENTANYL CITRATE (PF) 50 MCG/ML IJ SOSY
50.0000 ug | PREFILLED_SYRINGE | INTRAMUSCULAR | Status: DC | PRN
  Administered 2024-09-29: 50 ug via INTRAVENOUS
  Filled 2024-09-29: qty 1

## 2024-09-29 MED ORDER — VITAL 1.5 CAL PO LIQD
1000.0000 mL | ORAL | Status: DC
  Administered 2024-09-29: 1000 mL

## 2024-09-29 MED ORDER — ACETAMINOPHEN 10 MG/ML IV SOLN
1000.0000 mg | Freq: Four times a day (QID) | INTRAVENOUS | Status: AC | PRN
  Administered 2024-09-29: 1000 mg via INTRAVENOUS
  Filled 2024-09-29: qty 100

## 2024-09-29 MED ORDER — FENTANYL CITRATE (PF) 50 MCG/ML IJ SOSY
50.0000 ug | PREFILLED_SYRINGE | INTRAMUSCULAR | Status: DC | PRN
  Administered 2024-10-01: 50 ug via INTRAVENOUS
  Administered 2024-10-02: 100 ug via INTRAVENOUS
  Administered 2024-10-02: 200 ug via INTRAVENOUS
  Administered 2024-10-02: 100 ug via INTRAVENOUS
  Administered 2024-10-02: 200 ug via INTRAVENOUS
  Administered 2024-10-03: 50 ug via INTRAVENOUS
  Administered 2024-10-03: 200 ug via INTRAVENOUS
  Administered 2024-10-03: 100 ug via INTRAVENOUS
  Administered 2024-10-03 – 2024-10-04 (×4): 200 ug via INTRAVENOUS
  Administered 2024-10-05: 100 ug via INTRAVENOUS
  Filled 2024-09-29: qty 2
  Filled 2024-09-29: qty 4
  Filled 2024-09-29: qty 1
  Filled 2024-09-29: qty 2
  Filled 2024-09-29 (×3): qty 4
  Filled 2024-09-29 (×2): qty 2
  Filled 2024-09-29 (×3): qty 4
  Filled 2024-09-29: qty 2
  Filled 2024-09-29 (×2): qty 4

## 2024-09-29 MED ORDER — CHLORHEXIDINE GLUCONATE CLOTH 2 % EX PADS
6.0000 | MEDICATED_PAD | Freq: Every day | CUTANEOUS | Status: DC
  Administered 2024-09-30 – 2024-10-07 (×9): 6 via TOPICAL

## 2024-09-29 MED ORDER — DOCUSATE SODIUM 50 MG/5ML PO LIQD
100.0000 mg | Freq: Two times a day (BID) | ORAL | Status: DC
  Administered 2024-09-29 – 2024-10-10 (×20): 100 mg
  Filled 2024-09-29 (×20): qty 10

## 2024-09-29 MED ORDER — PHENYLEPHRINE HCL-NACL 20-0.9 MG/250ML-% IV SOLN
0.0000 ug/min | INTRAVENOUS | Status: DC
  Administered 2024-09-29: 20 ug/min via INTRAVENOUS
  Administered 2024-09-29: 160 ug/min via INTRAVENOUS
  Administered 2024-09-29: 210 ug/min via INTRAVENOUS
  Filled 2024-09-29: qty 500
  Filled 2024-09-29: qty 250

## 2024-09-29 MED ORDER — POLYETHYLENE GLYCOL 3350 17 G PO PACK
17.0000 g | PACK | Freq: Every day | ORAL | Status: DC
  Administered 2024-09-29 – 2024-09-30 (×2): 17 g
  Filled 2024-09-29 (×2): qty 1

## 2024-09-29 MED FILL — Fentanyl Citrate Preservative Free (PF) Inj 100 MCG/2ML: INTRAMUSCULAR | Qty: 2 | Status: AC

## 2024-09-29 MED FILL — Ketamine HCl Inj 10 MG/ML: INTRAMUSCULAR | Qty: 20 | Status: AC

## 2024-09-29 NOTE — Progress Notes (Signed)
 Turned off Epo at [facility]

## 2024-09-29 NOTE — Progress Notes (Signed)
 3 yr TCS noted in recall Patient result letter mailed Patient's PCP is on EPIC

## 2024-09-29 NOTE — Progress Notes (Addendum)
 eLink Physician-Brief Progress Note Patient Name: Bryce Martinez DOB: 06/17/1951 MRN: [id number]   Date of Service  09/29/2024  HPI/Events of Note  Mag 1.4 @ 1424 I dont see that he got any replacment. Want to recheck now or go ahead and replace   eICU Interventions  Re check Mag level stat     Intervention Category Intermediate Interventions: Electrolyte abnormality - evaluation and management  Jodelle ONEIDA Hutching 09/29/2024, 8:34 PM  23:06 K+ 3.6 Mag 1.6 Phos 2.1 Has central line & OGT Cr 1.4, improving.  - replacement ordered

## 2024-09-29 NOTE — Progress Notes (Addendum)
 eLink Physician-Brief Progress Note Patient Name: Bryce Martinez DOB: September 23, 1951 MRN: [id number]   Date of Service  09/29/2024  HPI/Events of Note  Pt reported to be in sinus tachycardia with HR in the 130s.  Afebrile.  Sedated with propofol . On levophed  at 14mcg/min  eICU Interventions  Will give trial of fentanyl  50mcg IV Will follow response.         Asma Boldon M DELA CRUZ 09/29/2024, 1:43 AM  2:34 AM Pt remains in sinus tach, HR 130s. Fentanyl  PRN without effect.  BP labile, levophed  had been titrated up to 19mcg/min.  Will add phenylephrine , try to wean down levophed , and see if HR would come down.

## 2024-09-29 NOTE — Plan of Care (Signed)
  Problem: Clinical Measurements: Goal: Will remain free from infection Outcome: Progressing Goal: Respiratory complications will improve Outcome: Progressing Goal: Cardiovascular complication will be avoided Outcome: Progressing   Problem: Coping: Goal: Level of anxiety will decrease Outcome: Progressing

## 2024-09-29 NOTE — Progress Notes (Signed)
 NAME:  Bryce Martinez, MRN:  [id number], DOB:  1951-05-29, LOS: 2 ADMISSION DATE:  09/27/2024, CONSULTATION DATE:  11/24 REFERRING MD:  EDP CHIEF COMPLAINT:  ARDS   History of Present Illness:  Bryce Martinez is a 73 year old male with a pertinent past medical history of hypertension, hyperlipidemia, arthritis, GERD, and history of  hepatitis C s/p treatment and sustained virologic response with advanced fibrosis who presented to [facility] this morning for a routine screening colonoscopy. During the procedure, it was noted by anesthesia and gastroenterology that the patient had a clear/slightly bilious emesis during the procedure. He has had prior colonoscopies without complication. He was monitored after the procedure to be saturating 93% on room air.  Upon discharge, patient denied pain, SOB, or cough. Shortly after returning home, the patient noticed pain to the middle of his back and abdomen, felt SOB, and could not breathe deeply. Took 2 Pepto Bismol without relief. Pt's wife was advised to take him to [facility] ED, where he was placed on NRB at 15L and SpO2 remained at 92%.  CXR was done and showed pneumonia in the left lung and possible pleural effusion. Patient then placed on BiPAP and HFNC, however remained dyspneic and tachypneic. Denies further N/V since procedure. Patient was ultimately intubated for airway.   PCCM was consulted for transfer to [facility] and admission for further management. Pertinent  Medical History   Past Medical History:  Diagnosis Date   Allergy    Arthritis    ED (erectile dysfunction)    GERD (gastroesophageal reflux disease)    Occasionally   Hepatitis C    Hyperlipidemia    Hypertension    Significant Hospital Events: Including procedures, antibiotic start and stop dates in addition to other pertinent events   11/24 -Colonoscopy at [facility] with Dr. Eartha Flavors, postop O2 requirements showing 93% on RA at discharge. 11/24- Trialed 15L  NRB, BiPAP without respiratory improvement. PCCM consulted for admission and worsening respiratory status,  intubated in [facility] ED.  11/24- Left radial arterial line placed  11/24- 2030, CVC was placed for increased Levophed  requirements  Interim History / Subjective:  1930- Epo turned off 0143- Pt went into sinus tach with HR in 130s. Propofol  sedation, on Levophed  14mcg at that time. Trialed on IV Fentanyl  50mcg, without effect. Levophed  titrated up to 19mcg and BP labile. Phenylphrine added.  This AM, patient is diaphoretic with a fever to 100.57F.  Objective   Blood pressure (!) 140/79, pulse 94, temperature 100.1 F (37.8 C), temperature source Oral, resp. rate (!) 28, height 6' 1 (1.854 m), weight 82.1 kg, SpO2 100%.    Vent Mode: PRVC FiO2 (%):  [50 %-70 %] 50 % Set Rate:  [18 bmp-24 bmp] 18 bmp Vt Set:  [500 mL] 500 mL PEEP:  [10 cmH20] 10 cmH20 Plateau Pressure:  [25 cmH20-27 cmH20] 25 cmH20   Intake/Output Summary (Last 24 hours) at 09/29/2024 1143 Last data filed at 09/29/2024 1100 Gross per 24 hour  Intake 2394.29 ml  Output 2260 ml  Net 134.29 ml   Filed Weights   09/27/24 1034 09/28/24 0500 09/29/24 0500  Weight: 81.2 kg 83 kg 82.1 kg   Examination: General: toxic appearing male, diaphoretic, NAD HENT: [place], [place], ETT in place with minimal secretions. OG tube in place in right nare suctioning dark bilious secretions Lungs: MV, lower left lung field with clear breath sounds. No crackles or wheezing. Cardiovascular: tachycardic, no murmurs/rubs/gallops, no JVD Abdomen: soft, bowel sounds negative,  nondistended, no rigidity or masses. Extremities: Bilateral UE/LE warming, no edema. DP/PT pulses bilaterally diminished. Radial pulses 1+. Neuro: intubated and sedated. Not responsive to voice, touch. Grimaces to noxious stimuli.  GU: Foley placed with yellow urine output  Pertinent labs, imaging, and tests in the past 24 hours: Na: 134 K: 3.9 BUN/Cr: 20/1.47 Cl:  102 BG: 130 H/H: 13.7/39.5 WBC: 17.7 Lactic acid: 5.0 > 5.5 > 4.1 > 4.2 > 4.8  ABG: pH 7.25 pCO2 38 pO2 59  bicarb 16.7, anion gap 19.3 ABG: pH 7.31 pCO2 36 pO2 57 bicarb 18.1, anion gap 17.9 ABG 11/25 0238 pH 7.348 pCO2 36.2 pO2 56 bicarb 19.9, anion gap 12.1 ABG 11/25 0500 pH 7.386 pCO2 30.2 pO2 65 bicarb 18.1, anion gap 9.9 ABG 11/26 0300 pH 7.408 pCO2 30.8 pO2 84 bicarb 19.4, anion gap 12.6  CXR overnight showing improving presentation to prior CXR of patchy bilateral airspace disease with L > R, indicative of pneumonia, edema, or aspiration. No PTX. No pleural effusion.   Resolved problem list   Assessment and Plan   Respiratory Acute Hypoxic Respiratory Failure 2/2 Acute Respiratory Distress Syndrome from Acute Aspiration Pneumonia P: - Patient vomited during colonoscopy while in left lateral decubitus position, likely aspirating on emesis. Patient also incidentally found to be COVID positive on respiratory viral panel, however was asymptomatic on presentation.  Intubated at [facility] ED on 11/24. - PaO2/FiO2 ratio improving to 129.38mmHg with PEEP =10, indicating moderate ARDS. - Vent settings overnight FIO2 65%, PEEP 10, TV 500. Maintain lung protection ventilation of 6cc/kg of ideal body weight to prevent volutrauma or barotrauma. Will wean to FiO2 50%, PEEP 10, RR 18, and TV 500 today.  - Epo turned off 11/25 - Consider proning patient if FiO2/PEEP/EPO is maxed out and refractory hypoxemia. - Continue stress dose steroids with 5 days of Decadron  20mg  (D1= 11/25-11/29), followed by 5 days of Decadron  10mg  (D1= 11/30-12/4) to cover for ARDS and septic shock. - VAP protocol - SAT and SBT when appropriate - Continue IV Zosyn  (D1= 11/25) for broadened aspiration pneumonia coverage. Trachea aspirate growing rare gram negative rods with few E. Coli. Pending susceptibilities.  - MRSA nares negative. Will discontinue Vancomycin  to narrow abx coverage. - Bcx collected showing  NGTD x 2 days  Infection History of Hepatitis C with Advanced Fibrosis P: - s/p treatment  - Currently fibrotic per elastography 02/2023. RUQ US  on 03/2024 WNL. - Continue to follow outpatient with GI, already established.  Cardiac Shock, presumed likely distributive 2/2 sepsis from aspiration pneumonia and hypovolemia P: - On Phenylphrine 220mcg and Vasopressin  0.58mcg overnight, will wean off Phenylphrine and switch to Levophed  20mcg to maintain MAP > 65.  - Maintain Propofol  25mcg for sedation - Likely intravascularly depleted due to colonoscopy bowel prep preceding admission - Cooximetry showing O2 saturation of 78, supporting distributive shock  - Echo showing LVEF 25-30% with global hypokinesis of left ventricle. Slight mitral valve regurgitation, but no other valvular abnormalities. New LVEF likely secondary to septic cardiomyopathy. Recommend repeating echo in outpatient setting to monitor for improvement in 2 months.  History of Hypertension P: - Hold home Losartan  100mg  and Spironolactone  25mg  daily while intubated  Renal AKI P: - Likely prerenal in the setting of combined renal hypoperfusion and initiation of pressor support - Cr today 1.47, improving from 1.86. Will continue to monitor daily.  - UA with some protein, slight bacteria. No RBCs or casts.  Metabolic Anion Gap Metabolic Acidosis 2/2 Lactic Acidosis, improving  P: -  Likely secondary to lactic acidosis from tissue hypoperfusion - Anion gap improving on repeat ABGs - Lactic acid 5.0 > 5.5 > 4.1 > 4.2 > 4.8 > 4.76 - Trend daily BMP, closely monitor lactic acid for improvement.   Endocrine: Type 2 Diabetes Mellitus P: -SSI  -Long-acting insulin  q4hr -CBG q 4hr  Nutrition: P: -Patient with ~426mL total of dark bilious output since ICU admission -OG tube to suction -Will consult RD for tube feeds, possibility of [employer]  Tubes/Lines/Therapy Tubes: ETT, OG tube, foley catheter Lines: Right  Triple-Lumen internal jugular CVC, Left radial arterial line, 3 PIVs (2 right, 1 left) DVT Ppx: [place] Heparin  GI Ppx: Pantroprazole 40mg  daily  Labs   CBC: Recent Labs  Lab 09/27/24 1106 09/28/24 0238 09/28/24 0458 09/28/24 1504 09/28/24 1647 09/28/24 1826 09/29/24 0306 09/29/24 0452  WBC 4.9 2.0*  --   --   --   --   --  17.7*  NEUTROABS 3.7  --   --   --   --   --   --   --   HGB 15.3 7.9*  12.2*   < > 13.9 13.9 13.9 13.9 13.7  HCT 48.0 23.5*  36.0*   < > 41.0 41.0 41.0 41.0 39.5  MCV 87.6 85.5  --   --   --   --   --  80.9  PLT 275 109*  --   --   --   --   --  152   < > = values in this interval not displayed.    Basic Metabolic Panel: Recent Labs  Lab 09/27/24 1106 09/28/24 0238 09/28/24 0347 09/28/24 0458 09/28/24 1504 09/28/24 1647 09/28/24 1826 09/29/24 0306 09/29/24 0452  NA 136   < > 136   < > 134* 132* 132* 134* 134*  K 3.7   < > 3.8   < > 4.2 4.2 4.1 3.8 3.9  CL 100  --  105  --   --   --   --   --  102  CO2 22  --  18*  --   --   --   --   --  19*  GLUCOSE 186*  --  269*  --   --   --   --   --  130*  BUN 20  --  19  --   --   --   --   --  20  CREATININE 1.26*  --  1.83*  --   --   --   --   --  1.47*  CALCIUM  9.9  --  8.2*  --   --   --   --   --  8.0*   < > = values in this interval not displayed.   GFR: Estimated Creatinine Clearance: 51.3 mL/min (A) (by C-G formula based on SCr of 1.47 mg/dL (H)). Recent Labs  Lab 09/27/24 1106 09/27/24 1157 09/27/24 1627 09/28/24 0238 09/28/24 0351 09/29/24 0452 09/29/24 0652 09/29/24 0807  WBC 4.9  --   --  2.0*  --  17.7*  --   --   LATICACIDVEN 5.0*   < > 4.1*  --  4.2*  --  4.8* 4.8*   < > = values in this interval not displayed.    Liver Function Tests: Recent Labs  Lab 09/27/24 1106 09/29/24 0452  AST 31 46*  ALT 37 25  ALKPHOS 61 40  BILITOT 2.0* 2.0*  PROT 8.1 5.8*  ALBUMIN  4.7 2.5*   Recent Labs  Lab 09/27/24 1106  LIPASE 26   No results for input(s): AMMONIA in the last 168  hours.  ABG    Component Value Date/Time   PHART 7.408 09/29/2024 0306   PCO2ART 30.8 (L) 09/29/2024 0306   PO2ART 84 09/29/2024 0306   HCO3 19.4 (L) 09/29/2024 0306   TCO2 20 (L) 09/29/2024 0306   ACIDBASEDEF 4.0 (H) 09/29/2024 0306   O2SAT 97 09/29/2024 0306     Coagulation Profile: No results for input(s): INR, PROTIME in the last 168 hours.  Cardiac Enzymes: No results for input(s): CKTOTAL, CKMB, CKMBINDEX, TROPONINI in the last 168 hours.  HbA1C: Hgb A1c MFr Bld  Date/Time Value Ref Range Status  09/16/2017 09:58 AM 5.5 4.8 - 5.6 % Final    Comment:             Prediabetes: 5.7 - 6.4          Diabetes: >6.4          Glycemic control for adults with diabetes: <7.0   05/20/2015 10:10 AM 5.8 (H) <5.7 % Final    Comment:                                                                           According to the ADA Clinical Practice Recommendations for 2011, when HbA1c is used as a screening test:     >=6.5%   Diagnostic of Diabetes Mellitus            (if abnormal result is confirmed)   5.7-6.4%   Increased risk of developing Diabetes Mellitus   References:Diagnosis and Classification of Diabetes Mellitus,Diabetes Care,2011,34(Suppl 1):S62-S69 and Standards of Medical Care in         Diabetes - 2011,Diabetes Care,2011,34 (Suppl 1):S11-S61.       CBG: Recent Labs  Lab 09/28/24 1924 09/28/24 2314 09/29/24 0319 09/29/24 0720 09/29/24 1117  GLUCAP 120* 134* 138* 129* 116*    Past Medical History:  He,  has a past medical history of Allergy, Arthritis, ED (erectile dysfunction), GERD (gastroesophageal reflux disease), Hepatitis C, Hyperlipidemia, and Hypertension.   Surgical History:   Past Surgical History:  Procedure Laterality Date   APPENDECTOMY     CHOLECYSTECTOMY N/A 04/25/2014   Procedure: LAPAROSCOPIC CHOLECYSTECTOMY;  Surgeon: Oneil DELENA Budge, MD;  Location: [facility];  Service: General;  Laterality: N/A;   COLONOSCOPY N/A 09/01/2019    Procedure: COLONOSCOPY;  Surgeon: Golda Claudis PENNER, MD;  Location: [facility];  Service: Endoscopy;  Laterality: N/A;  930   COLONOSCOPY N/A 09/27/2024   Procedure: COLONOSCOPY;  Surgeon: Eartha Angelia Sieving, MD;  Location: [facility];  Service: Gastroenterology;  Laterality: N/A;  7:30am, ASA 1-2   HEMICOLECTOMY Right    lap hand assisted   POLYPECTOMY  09/01/2019   Procedure: POLYPECTOMY;  Surgeon: Golda Claudis PENNER, MD;  Location: [facility];  Service: Endoscopy;;  colon   right shoulder surgery Right 03/2005   rotator cuff repair     Social History:   reports that he quit smoking about 52 years ago. His smoking use included cigarettes. He started smoking about 67 years ago. He has a 4.4 pack-year smoking history. He has  been exposed to tobacco smoke. He has never used smokeless tobacco. He reports that he does not currently use alcohol. He reports that he does not use drugs.   Family History:  His family history includes Cancer in his father; Diabetes in his brother; Drug abuse in his brother; Hypertension in his brother, daughter, mother, sister, and son.   Allergies Allergies  Allergen Reactions   Amlodipine  Other (See Comments)    unknown     Home Medications  Prior to Admission medications   Medication Sig Start Date End Date Taking? Authorizing Provider  losartan  (COZAAR ) 100 MG tablet TAKE 1 TABLET BY MOUTH EVERY DAY 03/24/24   Antonetta Rollene BRAVO, MD  meclizine  (ANTIVERT ) 25 MG tablet Take 1 tablet (25 mg total) by mouth 2 (two) times daily as needed for dizziness. 08/24/24   Tobie Suzzane POUR, MD  Misc. Devices MISC Blood pressure cuff/device - 1. ICD10: I10 08/24/24   Tobie Suzzane POUR, MD  spironolactone  (ALDACTONE ) 25 MG tablet TAKE 1 TABLET (25 MG TOTAL) BY MOUTH DAILY. 06/24/24   Antonetta Rollene BRAVO, MD     Critical care time: 60    Rhianon Zabawa, DO Internal Medicine Resident, PGY-1 Please contact the on call pager at [phone] for any urgent or  emergent needs. 11:43 AM 09/29/2024

## 2024-09-30 DIAGNOSIS — A419 Sepsis, unspecified organism: Secondary | ICD-10-CM | POA: Diagnosis not present

## 2024-09-30 DIAGNOSIS — J80 Acute respiratory distress syndrome: Secondary | ICD-10-CM | POA: Diagnosis not present

## 2024-09-30 DIAGNOSIS — R6521 Severe sepsis with septic shock: Secondary | ICD-10-CM | POA: Diagnosis not present

## 2024-09-30 DIAGNOSIS — I5021 Acute systolic (congestive) heart failure: Secondary | ICD-10-CM | POA: Diagnosis not present

## 2024-09-30 LAB — POCT I-STAT 7, (LYTES, BLD GAS, ICA,H+H)
Acid-Base Excess: 1 mmol/L (ref 0.0–2.0)
Bicarbonate: 23.3 mmol/L (ref 20.0–28.0)
Calcium, Ion: 1.05 mmol/L — ABNORMAL LOW (ref 1.15–1.40)
HCT: 61 % — ABNORMAL HIGH (ref 39.0–52.0)
Hemoglobin: 20.7 g/dL — ABNORMAL HIGH (ref 13.0–17.0)
O2 Saturation: 95 %
Potassium: 3.5 mmol/L (ref 3.5–5.1)
Sodium: 133 mmol/L — ABNORMAL LOW (ref 135–145)
TCO2: 24 mmol/L (ref 22–32)
pCO2 arterial: 32.7 mmHg (ref 32–48)
pH, Arterial: 7.462 — ABNORMAL HIGH (ref 7.35–7.45)
pO2, Arterial: 69 mmHg — ABNORMAL LOW (ref 83–108)

## 2024-09-30 LAB — BASIC METABOLIC PANEL WITH GFR
Anion gap: 12 (ref 5–15)
BUN: 27 mg/dL — ABNORMAL HIGH (ref 8–23)
CO2: 23 mmol/L (ref 22–32)
Calcium: 7.9 mg/dL — ABNORMAL LOW (ref 8.9–10.3)
Chloride: 99 mmol/L (ref 98–111)
Creatinine, Ser: 1.17 mg/dL (ref 0.61–1.24)
GFR, Estimated: >60 mL/min (ref >60)
Glucose, Bld: 160 mg/dL — ABNORMAL HIGH (ref 70–99)
Potassium: 3.7 mmol/L (ref 3.5–5.1)
Sodium: 134 mmol/L — ABNORMAL LOW (ref 135–145)

## 2024-09-30 LAB — CBC
HCT: 32.5 % — ABNORMAL LOW (ref 39.0–52.0)
Hemoglobin: 11.5 g/dL — ABNORMAL LOW (ref 13.0–17.0)
MCH: 28.3 pg (ref 26.0–34.0)
MCHC: 35.4 g/dL (ref 30.0–36.0)
MCV: 80 fL (ref 80.0–100.0)
Platelets: 111 K/uL — ABNORMAL LOW (ref 150–400)
RBC: 4.06 MIL/uL — ABNORMAL LOW (ref 4.22–5.81)
RDW: 13.2 % (ref 11.5–15.5)
WBC: 22.7 K/uL — ABNORMAL HIGH (ref 4.0–10.5)
nRBC: 0.1 % (ref 0.0–0.2)

## 2024-09-30 LAB — LACTIC ACID, PLASMA: Lactic Acid, Venous: 3.2 mmol/L (ref 0.5–1.9)

## 2024-09-30 LAB — GLUCOSE, CAPILLARY
Glucose-Capillary: 166 mg/dL — ABNORMAL HIGH (ref 70–99)
Glucose-Capillary: 146 mg/dL — ABNORMAL HIGH (ref 70–99)
Glucose-Capillary: 173 mg/dL — ABNORMAL HIGH (ref 70–99)
Glucose-Capillary: 146 mg/dL — ABNORMAL HIGH (ref 70–99)
Glucose-Capillary: 137 mg/dL — ABNORMAL HIGH (ref 70–99)
Glucose-Capillary: 228 mg/dL — ABNORMAL HIGH (ref 70–99)

## 2024-09-30 LAB — PHOSPHORUS: Phosphorus: 2.2 mg/dL — ABNORMAL LOW (ref 2.5–4.6)

## 2024-09-30 LAB — CULTURE, RESPIRATORY W GRAM STAIN: Special Requests: NORMAL

## 2024-09-30 LAB — MAGNESIUM: Magnesium: 2.7 mg/dL — ABNORMAL HIGH (ref 1.7–2.4)

## 2024-09-30 MED ORDER — VITAL 1.5 CAL PO LIQD
1000.0000 mL | ORAL | Status: DC
  Administered 2024-09-30 – 2024-10-04 (×5): 1000 mL
  Filled 2024-09-30: qty 1000

## 2024-09-30 MED ORDER — POTASSIUM PHOSPHATES 15 MMOLE/5ML IV SOLN
15.0000 mmol | Freq: Once | INTRAVENOUS | Status: DC
  Filled 2024-09-30: qty 5

## 2024-09-30 MED ORDER — POTASSIUM PHOSPHATES 15 MMOLE/5ML IV SOLN
30.0000 mmol | Freq: Once | INTRAVENOUS | Status: AC
  Administered 2024-09-30: 30 mmol via INTRAVENOUS
  Filled 2024-09-30: qty 10

## 2024-09-30 NOTE — Plan of Care (Signed)
   Problem: Clinical Measurements: Goal: Will remain free from infection Outcome: Progressing Goal: Diagnostic test results will improve Outcome: Progressing Goal: Respiratory complications will improve Outcome: Progressing Goal: Cardiovascular complication will be avoided Outcome: Progressing

## 2024-09-30 NOTE — Progress Notes (Signed)
 NAME:  Bryce Martinez, MRN:  [id number], DOB:  03-02-1951, LOS: 3 ADMISSION DATE:  09/27/2024, CONSULTATION DATE:  11/24 REFERRING MD:  EDP CHIEF COMPLAINT:  ARDS   History of Present Illness:  Bryce Martinez is a 73 year old male with a pertinent past medical history of hypertension, hyperlipidemia, arthritis, GERD, and history of  hepatitis C s/p treatment and sustained virologic response with advanced fibrosis who presented to [facility] this morning for a routine screening colonoscopy. During the procedure, it was noted by anesthesia and gastroenterology that the patient had a clear/slightly bilious emesis during the procedure. He has had prior colonoscopies without complication. He was monitored after the procedure to be saturating 93% on room air.  Upon discharge, patient denied pain, SOB, or cough. Shortly after returning home, the patient noticed pain to the middle of his back and abdomen, felt SOB, and could not breathe deeply. Took 2 Pepto Bismol without relief. Pt's wife was advised to take him to [facility] ED, where he was placed on NRB at 15L and SpO2 remained at 92%.  CXR was done and showed pneumonia in the left lung and possible pleural effusion. Patient then placed on BiPAP and HFNC, however remained dyspneic and tachypneic. Denies further N/V since procedure. Patient was ultimately intubated for airway.   PCCM was consulted for transfer to [facility] and admission for further management. Pertinent  Medical History   Past Medical History:  Diagnosis Date   Allergy    Arthritis    ED (erectile dysfunction)    GERD (gastroesophageal reflux disease)    Occasionally   Hepatitis C    Hyperlipidemia    Hypertension    Significant Hospital Events: Including procedures, antibiotic start and stop dates in addition to other pertinent events   11/24 -Colonoscopy at [facility] with Dr. Eartha Flavors, postop O2 requirements showing 93% on RA at discharge. 11/24- Trialed 15L  NRB, BiPAP without respiratory improvement. PCCM consulted for admission and worsening respiratory status,  intubated in [facility] ED.  11/24- Left radial arterial line placed  11/24- 2030, CVC was placed for increased Levophed  requirements  Interim History / Subjective:  2306- Electrolyte replacement for K, Mag, and Phosphate.   Otherwise no acute O/N events.   Objective   Blood pressure 137/83, pulse 83, temperature 98.9 F (37.2 C), temperature source Axillary, resp. rate (!) 25, height 6' 1 (1.854 m), weight 81.6 kg, SpO2 100%.    Vent Mode: PRVC FiO2 (%):  [40 %-50 %] 40 % Set Rate:  [14 bmp-18 bmp] 14 bmp Vt Set:  [500 mL] 500 mL PEEP:  [8 cmH20-10 cmH20] 8 cmH20 Plateau Pressure:  [23 cmH20-28 cmH20] 24 cmH20   Intake/Output Summary (Last 24 hours) at 09/30/2024 1007 Last data filed at 09/30/2024 0900 Gross per 24 hour  Intake 1839.93 ml  Output 1765 ml  Net 74.93 ml   Filed Weights   09/28/24 0500 09/29/24 0500 09/30/24 0500  Weight: 83 kg 82.1 kg 81.6 kg   Examination: General: toxic appearing male, diaphoretic, NAD HENT: [place], [place]ER[place], ETT in place with minimal secretions. OG tube in place in right nare Lungs: MV, clear to auscultation bilaterally No crackles or wheezing. Cardiovascular: RRR, no murmurs/rubs/gallops, no JVD Abdomen: soft, bowel sounds negative, nondistended, no rigidity or masses. Extremities: Warm, no edema. DP/PT pulses bilaterally diminished. Radial pulses 1+. Neuro: intubated and deeply sedated. Not responsive to voice, touch. Grimaces to noxious stimuli.  GU: Foley placed with yellow urine output  Pertinent labs, imaging,  and tests in the past 24 hours: Na: 134 K: 3.7 BUN/Cr: 27/ 1.17(improved from 1.47) Cl: 99 Mag: 2.7 Phosphorus: 2.2 BG: 137 H/H: 11.5/32.5 WBC: 22.7 Lactic acid: 4.8 > 3.2  ABG 11/27 0508 pH 7.462  pCO2 32.7 pO2 69 bicarb 23.3  Resolved problem list   Assessment and Plan   Respiratory Acute Hypoxic Respiratory  Failure 2/2 Severe Acute Respiratory Distress Syndrome from Acute Aspiration Pneumonia P: - PaO2/FiO2 ratio improving to 138.0 mmHg with PEEP =10, indicating improving moderate ARDS. - Vent settings overnight FIO2 50%, PEEP 10, TV 500. Maintain lung protection ventilation of 6cc/kg of ideal body weight to prevent volutrauma or barotrauma. Will wean to FiO2 40%, PEEP 8, RR 14, and TV 500 today.  - Consider proning patient if FiO2/PEEP/EPO is maxed out and refractory hypoxemia. - Continue stress dose steroids with 5 days of Decadron  20mg  (D1= 11/25-11/29), followed by 5 days of Decadron  10mg  (D1= 11/30-12/4) to cover for ARDS and septic shock. - VAP protocol - Will trial SAT today, potentially SBT if appropriate - Continue IV Zosyn  (D1= 11/25) for broadened aspiration pneumonia coverage. Trachea aspirate growing rare gram negative rods with few E. Coli. Pending susceptibilities.  - Bcx collected showing NGTD x 3 days  Infection History of Hepatitis C with Advanced Fibrosis P: - s/p treatment  - Currently fibrotic per elastography 02/2023. RUQ US  on 03/2024 WNL. - Continue to follow outpatient with GI, already established.  Cardiac Shock, presumed likely distributive 2/2 sepsis from aspiration pneumonia and hypovolemia P: - On Levophed  6mcg and Vasopression 0.39mcg overnight  - Will wean Levophed  off as tolerated to maintain MAP > 65.  - Maintained Propofol  30mcg for sedation, will wean and trial SAT/SBT today - Likely intravascularly depleted due to colonoscopy bowel prep preceding admission - Cooximetry showing O2 saturation of 78, supporting distributive shock  - Echo showing LVEF 25-30% with global hypokinesis of left ventricle. Slight mitral valve regurgitation, but no other valvular abnormalities. New LVEF likely secondary to septic cardiomyopathy. Recommend repeating echo in outpatient setting to monitor for improvement in 2 months.  History of Hypertension P: - Hold home Losartan   100mg  and Spironolactone  25mg  daily while intubated  Renal AKI P: - Likely prerenal in the setting of combined renal hypoperfusion and initiation of pressor support - Cr today 1.17, improving. Will continue to monitor daily.   Metabolic Anion Gap Metabolic Acidosis 2/2 Lactic Acidosis With Respiratory Alkalosis  P: - Likely secondary to lactic acidosis from tissue hypoperfusion, now with respiratory alkalosis  - Anion gap improving on repeat ABGs - Lactic acid 4.8 > 3.2 - Trend daily CMP, closely monitor lactic acid for improvement.   Electrolyte Abnormalities P: - Goal of K > 4 and Mag >2  - K this morning 3.7, Mag 2.7, and Phosphorus 2.2 - Kphos replacement today - Will replenish electrolytes daily as necessary  Endocrine: Type 2 Diabetes Mellitus P: -SSI  -Long-acting insulin  q4hr -CBG q 4hr  Nutrition: P: -OG tube, no secretions or overnight emesis -Continue tube feeds, will increase from trickle feeds  Tubes/Lines/Therapy Tubes: ETT, OG tube, foley catheter Lines: Right Triple-Lumen internal jugular CVC, Left radial arterial line, 3 PIVs (2 right, 1 left) DVT Ppx: [place] Heparin  GI Ppx: Pantroprazole 40mg  daily  Labs   CBC: Recent Labs  Lab 09/27/24 1106 09/28/24 0238 09/28/24 0458 09/28/24 1826 09/29/24 0306 09/29/24 0452 09/30/24 0508 09/30/24 0536  WBC 4.9 2.0*  --   --   --  17.7*  --  22.7*  NEUTROABS 3.7  --   --   --   --   --   --   --   HGB 15.3 7.9*  12.2*   < > 13.9 13.9 13.7 20.7* 11.5*  HCT 48.0 23.5*  36.0*   < > 41.0 41.0 39.5 61.0* 32.5*  MCV 87.6 85.5  --   --   --  80.9  --  80.0  PLT 275 109*  --   --   --  152  --  111*   < > = values in this interval not displayed.    Basic Metabolic Panel: Recent Labs  Lab 09/27/24 1106 09/28/24 0238 09/28/24 0347 09/28/24 0458 09/28/24 1826 09/29/24 0306 09/29/24 0452 09/29/24 1424 09/29/24 2035 09/30/24 0508 09/30/24 0536  NA 136   < > 136   < > 132* 134* 134*  --   --   133* 134*  K 3.7   < > 3.8   < > 4.1 3.8 3.9  --  3.6 3.5 3.7  CL 100  --  105  --   --   --  102  --   --   --  99  CO2 22  --  18*  --   --   --  19*  --   --   --  23  GLUCOSE 186*  --  269*  --   --   --  130*  --   --   --  160*  BUN 20  --  19  --   --   --  20  --   --   --  27*  CREATININE 1.26*  --  1.83*  --   --   --  1.47*  --   --   --  1.17  CALCIUM  9.9  --  8.2*  --   --   --  8.0*  --   --   --  7.9*  MG  --   --   --   --   --   --   --  1.4* 1.6*  --  2.7*  PHOS  --   --   --   --   --   --   --  2.4* 2.1*  --  2.2*   < > = values in this interval not displayed.   GFR: Estimated Creatinine Clearance: 64.5 mL/min (by C-G formula based on SCr of 1.17 mg/dL). Recent Labs  Lab 09/27/24 1106 09/27/24 1157 09/28/24 0238 09/28/24 0351 09/29/24 0452 09/29/24 0652 09/29/24 0807 09/30/24 0536  WBC 4.9  --  2.0*  --  17.7*  --   --  22.7*  LATICACIDVEN 5.0*   < >  --  4.2*  --  4.8* 4.8* 3.2*   < > = values in this interval not displayed.    Liver Function Tests: Recent Labs  Lab 09/27/24 1106 09/29/24 0452  AST 31 46*  ALT 37 25  ALKPHOS 61 40  BILITOT 2.0* 2.0*  PROT 8.1 5.8*  ALBUMIN 4.7 2.5*   Recent Labs  Lab 09/27/24 1106  LIPASE 26   No results for input(s): AMMONIA in the last 168 hours.  ABG    Component Value Date/Time   PHART 7.462 (H) 09/30/2024 0508   PCO2ART 32.7 09/30/2024 0508   PO2ART 69 (L) 09/30/2024 0508   HCO3 23.3 09/30/2024 0508   TCO2 24 09/30/2024 0508   ACIDBASEDEF 4.0 (H) 09/29/2024  0306   O2SAT 95 09/30/2024 0508     Coagulation Profile: No results for input(s): INR, PROTIME in the last 168 hours.  Cardiac Enzymes: No results for input(s): CKTOTAL, CKMB, CKMBINDEX, TROPONINI in the last 168 hours.  HbA1C: Hgb A1c MFr Bld  Date/Time Value Ref Range Status  09/16/2017 09:58 AM 5.5 4.8 - 5.6 % Final    Comment:             Prediabetes: 5.7 - 6.4          Diabetes: >6.4          Glycemic control for  adults with diabetes: <7.0   05/20/2015 10:10 AM 5.8 (H) <5.7 % Final    Comment:                                                                           According to the ADA Clinical Practice Recommendations for 2011, when HbA1c is used as a screening test:     >=6.5%   Diagnostic of Diabetes Mellitus            (if abnormal result is confirmed)   5.7-6.4%   Increased risk of developing Diabetes Mellitus   References:Diagnosis and Classification of Diabetes Mellitus,Diabetes Care,2011,34(Suppl 1):S62-S69 and Standards of Medical Care in         Diabetes - 2011,Diabetes Care,2011,34 (Suppl 1):S11-S61.       CBG: Recent Labs  Lab 09/29/24 1520 09/29/24 1913 09/29/24 2315 09/30/24 0314 09/30/24 0755  GLUCAP 114* 144* 137* 166* 146*    Past Medical History:  He,  has a past medical history of Allergy, Arthritis, ED (erectile dysfunction), GERD (gastroesophageal reflux disease), Hepatitis C, Hyperlipidemia, and Hypertension.   Surgical History:   Past Surgical History:  Procedure Laterality Date   APPENDECTOMY     CHOLECYSTECTOMY N/A 04/25/2014   Procedure: LAPAROSCOPIC CHOLECYSTECTOMY;  Surgeon: Oneil DELENA Budge, MD;  Location: [facility];  Service: General;  Laterality: N/A;   COLONOSCOPY N/A 09/01/2019   Procedure: COLONOSCOPY;  Surgeon: Golda Claudis PENNER, MD;  Location: [facility];  Service: Endoscopy;  Laterality: N/A;  930   COLONOSCOPY N/A 09/27/2024   Procedure: COLONOSCOPY;  Surgeon: Eartha Angelia Sieving, MD;  Location: [facility];  Service: Gastroenterology;  Laterality: N/A;  7:30am, ASA 1-2   HEMICOLECTOMY Right    lap hand assisted   POLYPECTOMY  09/01/2019   Procedure: POLYPECTOMY;  Surgeon: Golda Claudis PENNER, MD;  Location: [facility];  Service: Endoscopy;;  colon   right shoulder surgery Right 03/2005   rotator cuff repair     Social History:   reports that he quit smoking about 52 years ago. His smoking use included cigarettes. He started  smoking about 67 years ago. He has a 4.4 pack-year smoking history. He has been exposed to tobacco smoke. He has never used smokeless tobacco. He reports that he does not currently use alcohol. He reports that he does not use drugs.   Family History:  His family history includes Cancer in his father; Diabetes in his brother; Drug abuse in his brother; Hypertension in his brother, daughter, mother, sister, and son.   Allergies Allergies  Allergen Reactions   Amlodipine  Other (See Comments)  unknown     Home Medications  Prior to Admission medications   Medication Sig Start Date End Date Taking? Authorizing Provider  losartan  (COZAAR ) 100 MG tablet TAKE 1 TABLET BY MOUTH EVERY DAY 03/24/24   Antonetta Rollene BRAVO, MD  meclizine  (ANTIVERT ) 25 MG tablet Take 1 tablet (25 mg total) by mouth 2 (two) times daily as needed for dizziness. 08/24/24   Tobie Suzzane POUR, MD  Misc. Devices MISC Blood pressure cuff/device - 1. ICD10: I10 08/24/24   Tobie Suzzane POUR, MD  spironolactone  (ALDACTONE ) 25 MG tablet TAKE 1 TABLET (25 MG TOTAL) BY MOUTH DAILY. 06/24/24   Antonetta Rollene BRAVO, MD     Critical care time: 53    Jafet Wissing, DO Internal Medicine Resident, PGY-1 Please contact the on call pager at [phone] for any urgent or emergent needs. 10:07 AM 09/30/2024

## 2024-10-01 DIAGNOSIS — J80 Acute respiratory distress syndrome: Secondary | ICD-10-CM | POA: Diagnosis not present

## 2024-10-01 DIAGNOSIS — A419 Sepsis, unspecified organism: Secondary | ICD-10-CM | POA: Diagnosis not present

## 2024-10-01 DIAGNOSIS — R6521 Severe sepsis with septic shock: Secondary | ICD-10-CM | POA: Diagnosis not present

## 2024-10-01 DIAGNOSIS — I5021 Acute systolic (congestive) heart failure: Secondary | ICD-10-CM | POA: Diagnosis not present

## 2024-10-01 LAB — TRIGLYCERIDES: Triglycerides: 153 mg/dL — ABNORMAL HIGH (ref <150)

## 2024-10-01 LAB — CBC
HCT: 30.1 % — ABNORMAL LOW (ref 39.0–52.0)
Hemoglobin: 10.5 g/dL — ABNORMAL LOW (ref 13.0–17.0)
MCH: 28.3 pg (ref 26.0–34.0)
MCHC: 34.9 g/dL (ref 30.0–36.0)
MCV: 81.1 fL (ref 80.0–100.0)
Platelets: 111 K/uL — ABNORMAL LOW (ref 150–400)
RBC: 3.71 MIL/uL — ABNORMAL LOW (ref 4.22–5.81)
RDW: 13.2 % (ref 11.5–15.5)
WBC: 32.1 K/uL — ABNORMAL HIGH (ref 4.0–10.5)
nRBC: 0.2 % (ref 0.0–0.2)

## 2024-10-01 LAB — LACTIC ACID, PLASMA: Lactic Acid, Venous: 2.2 mmol/L (ref 0.5–1.9)

## 2024-10-01 LAB — COMPREHENSIVE METABOLIC PANEL WITH GFR
ALT: 26 U/L (ref 0–44)
AST: 35 U/L (ref 15–41)
Albumin: 2 g/dL — ABNORMAL LOW (ref 3.5–5.0)
Alkaline Phosphatase: 76 U/L (ref 38–126)
Anion gap: 11 (ref 5–15)
BUN: 38 mg/dL — ABNORMAL HIGH (ref 8–23)
CO2: 25 mmol/L (ref 22–32)
Calcium: 8.4 mg/dL — ABNORMAL LOW (ref 8.9–10.3)
Chloride: 102 mmol/L (ref 98–111)
Creatinine, Ser: 1.24 mg/dL (ref 0.61–1.24)
GFR, Estimated: >60 mL/min (ref >60)
Glucose, Bld: 154 mg/dL — ABNORMAL HIGH (ref 70–99)
Potassium: 3.8 mmol/L (ref 3.5–5.1)
Sodium: 138 mmol/L (ref 135–145)
Total Bilirubin: 0.9 mg/dL (ref 0.0–1.2)
Total Protein: 5.4 g/dL — ABNORMAL LOW (ref 6.5–8.1)

## 2024-10-01 LAB — POCT I-STAT 7, (LYTES, BLD GAS, ICA,H+H)
Acid-Base Excess: 4 mmol/L — ABNORMAL HIGH (ref 0.0–2.0)
Bicarbonate: 27.9 mmol/L (ref 20.0–28.0)
Calcium, Ion: 1.18 mmol/L (ref 1.15–1.40)
HCT: 31 % — ABNORMAL LOW (ref 39.0–52.0)
Hemoglobin: 10.5 g/dL — ABNORMAL LOW (ref 13.0–17.0)
O2 Saturation: 96 %
Patient temperature: 100.5
Potassium: 3.9 mmol/L (ref 3.5–5.1)
Sodium: 138 mmol/L (ref 135–145)
TCO2: 29 mmol/L (ref 22–32)
pCO2 arterial: 38.3 mmHg (ref 32–48)
pH, Arterial: 7.474 — ABNORMAL HIGH (ref 7.35–7.45)
pO2, Arterial: 84 mmHg (ref 83–108)

## 2024-10-01 LAB — GLUCOSE, CAPILLARY
Glucose-Capillary: 158 mg/dL — ABNORMAL HIGH (ref 70–99)
Glucose-Capillary: 150 mg/dL — ABNORMAL HIGH (ref 70–99)
Glucose-Capillary: 169 mg/dL — ABNORMAL HIGH (ref 70–99)
Glucose-Capillary: 231 mg/dL — ABNORMAL HIGH (ref 70–99)
Glucose-Capillary: 188 mg/dL — ABNORMAL HIGH (ref 70–99)
Glucose-Capillary: 129 mg/dL — ABNORMAL HIGH (ref 70–99)

## 2024-10-01 LAB — PHOSPHORUS
Phosphorus: 1.2 mg/dL — ABNORMAL LOW (ref 2.5–4.6)
Phosphorus: 2.4 mg/dL — ABNORMAL LOW (ref 2.5–4.6)

## 2024-10-01 LAB — MAGNESIUM: Magnesium: 2.7 mg/dL — ABNORMAL HIGH (ref 1.7–2.4)

## 2024-10-01 MED ORDER — POLYETHYLENE GLYCOL 3350 17 G PO PACK
17.0000 g | PACK | Freq: Two times a day (BID) | ORAL | Status: DC
  Administered 2024-10-01: 17 g
  Filled 2024-10-01: qty 1

## 2024-10-01 MED ORDER — POTASSIUM PHOSPHATES 15 MMOLE/5ML IV SOLN
45.0000 mmol | Freq: Once | INTRAVENOUS | Status: AC
  Administered 2024-10-01: 45 mmol via INTRAVENOUS
  Filled 2024-10-01: qty 15

## 2024-10-01 MED ORDER — SODIUM CHLORIDE 0.9 % IV SOLN
2.0000 g | INTRAVENOUS | Status: AC
  Administered 2024-10-01 – 2024-10-02 (×2): 2 g via INTRAVENOUS
  Filled 2024-10-01 (×2): qty 20

## 2024-10-01 MED ORDER — FUROSEMIDE 10 MG/ML IJ SOLN
40.0000 mg | Freq: Once | INTRAMUSCULAR | Status: AC
  Administered 2024-10-01: 40 mg via INTRAVENOUS
  Filled 2024-10-01: qty 4

## 2024-10-01 MED ORDER — THIAMINE MONONITRATE 100 MG PO TABS
100.0000 mg | ORAL_TABLET | Freq: Every day | ORAL | Status: AC
  Administered 2024-10-01 – 2024-10-05 (×5): 100 mg
  Filled 2024-10-01 (×5): qty 1

## 2024-10-01 MED ORDER — POTASSIUM PHOSPHATES 15 MMOLE/5ML IV SOLN
15.0000 mmol | Freq: Once | INTRAVENOUS | Status: AC
  Administered 2024-10-02: 15 mmol via INTRAVENOUS
  Filled 2024-10-01: qty 5

## 2024-10-01 MED ORDER — DEXMEDETOMIDINE HCL IN NACL 400 MCG/100ML IV SOLN
0.0000 ug/kg/h | INTRAVENOUS | Status: DC
  Administered 2024-10-01: 0.4 ug/kg/h via INTRAVENOUS
  Administered 2024-10-01 – 2024-10-02 (×2): 0.5 ug/kg/h via INTRAVENOUS
  Administered 2024-10-02: 0.4 ug/kg/h via INTRAVENOUS
  Administered 2024-10-04: 0.8 ug/kg/h via INTRAVENOUS
  Administered 2024-10-04: 0.6 ug/kg/h via INTRAVENOUS
  Filled 2024-10-01 (×2): qty 100
  Filled 2024-10-01 (×2): qty 200
  Filled 2024-10-01 (×3): qty 100

## 2024-10-01 MED ORDER — BISACODYL 10 MG RE SUPP
10.0000 mg | Freq: Once | RECTAL | Status: AC
  Administered 2024-10-01: 10 mg via RECTAL
  Filled 2024-10-01: qty 1

## 2024-10-01 NOTE — Plan of Care (Signed)
   Problem: Clinical Measurements: Goal: Ability to maintain clinical measurements within normal limits will improve Outcome: Progressing Goal: Respiratory complications will improve Outcome: Progressing Goal: Cardiovascular complication will be avoided Outcome: Progressing

## 2024-10-01 NOTE — Progress Notes (Signed)
 Nutrition Follow-up  DOCUMENTATION CODES:  Not applicable  INTERVENTION:  Recommend continuing the following via OGT: Vital 1.5 at 60 ml/h (1440 ml per day) Prosource TF20 60 ml 1x/d Provides 2240 kcal, 117 gm protein, 1100 ml free water  daily Thiamine  100mg  x 5 days as noted a drop in Mg and phosphorus since initiation of enteral feeds  NUTRITION DIAGNOSIS:  Increased nutrient needs related to acute illness as evidenced by estimated needs. - remains applicable  GOAL:  Patient will meet greater than or equal to 90% of their needs - progressing  MONITOR:  TF tolerance, Vent status, I & O's, Labs  REASON FOR ASSESSMENT:  Consult Enteral/tube feeding initiation and management  ASSESSMENT:  Pt with hx of of HTN, HLD, GERD and hx of hepatitis C presented to ED with SOB after vomiting after an outpatient procedure earlier in the day. Workup consistent with ARDS, intubated in ED.  11/24 - Presented to [facility], intubated, transferred to [facility] ICU  11/26 - trickles initiated   Patient is currently intubated on ventilator support. Discussed in rounds, attempted to wean this AM, will retry this afternoon. TF being advanced to goal, currently at 50. OGT was exchanged for a cortrak this AM for continued access for medications and enteral feeds in the event pt has difficulty with swallowing post-extubation.   Pressors off at the moment, propofol  also has been discontinued. RN reports bowel regimen has been escalated this AM. Will monitor for BM.    MV: 10.6 L/min Temp (24hrs), Avg:99.9 F (37.7 C), Min:99.4 F (37.4 C), Max:100.5 F (38.1 C) MAP (art line): 66-50mmHg  Propofol : 9.74 ml/hr (257 kcal/d at current infusion rate)  Admit weight: 81.2 kg   Current weight: 81.1 kg    Intake/Output Summary (Last 24 hours) at 10/01/2024 1117 Last data filed at 10/01/2024 1000 Gross per 24 hour  Intake 1967.79 ml  Output 3075 ml  Net -1107.21 ml  Net IO Since Admission:  5,206.35 mL [10/01/24 1117]  Drains/Lines: Art Line, left radial CVC Triple Lumen, right IJ Cortrak (gastric) UOP x 24 hours  Nutritionally Relevant Medications: Scheduled Meds:  dexamethasone   20 mg Intravenous Q24H   docusate  100 mg Per Tube BID   PROSource TF20  60 mL Per Tube Daily   insulin  aspart  0-20 Units Subcutaneous Q4H   Pantoprazole    40 mg Intravenous Q24H   polyethylene glycol  17 g Per Tube Daily   Continuous Infusions:  feeding supplement (VITAL 1.5 CAL) 50 mL/hr at 10/01/24 0800   norepinephrine  (LEVOPHED ) Adult infusion 2 mcg/min (10/01/24 0800)   piperacillin -tazobactam (ZOSYN )  IV 12.5 mL/hr at 10/01/24 0800   potassium PHOSPHATE  IVPB (in mmol) 64.4 mL/hr at 10/01/24 0800   propofol  (DIPRIVAN ) infusion 20 mcg/kg/min (10/01/24 0800)   Labs Reviewed: BUN 38 Phosphorus 1.2 Magnesium  2.7 CBG ranges from 137-228 mg/dL over the last 24 hours  NUTRITION - FOCUSED PHYSICAL EXAM: Deficits noted around the face, appear to be pt's normal bone structure based on wife's report and pt picture in EMR Flowsheet Row Most Recent Value  Orbital Region Mild depletion  Upper Arm Region No depletion  Thoracic and Lumbar Region No depletion  Buccal Region No depletion  Temple Region Mild depletion  Clavicle Bone Region No depletion  Clavicle and Acromion Bone Region Mild depletion  Scapular Bone Region No depletion  Dorsal Hand No depletion  Patellar Region No depletion  Anterior Thigh Region No depletion  Posterior Calf Region No depletion  Edema (RD Assessment) None  Hair Reviewed  Eyes Reviewed  Mouth Reviewed  Skin Reviewed  Nails Reviewed    Diet Order:   Diet Order             Diet NPO time specified  Diet effective now                   EDUCATION NEEDS:  Education needs have been addressed  Skin:  Skin Assessment: Reviewed RN Assessment  Last BM:  PTA  Height:  Ht Readings from Last 1 Encounters:  09/27/24 6' 1 (1.854 m)     Weight:  Wt Readings from Last 1 Encounters:  10/01/24 81.1 kg    Ideal Body Weight:  83.6 kg  BMI:  Body mass index is 23.59 kg/m.  Estimated Nutritional Needs:  Kcal:  2200-2500 kcal/d Protein:  110-125g/d Fluid:  2.5L/d    Bryce Martinez, RD, LDN, CNSC Registered Dietitian II Please reach out via secure chat

## 2024-10-01 NOTE — Progress Notes (Signed)
 NAME:  Bryce Martinez, MRN:  [id number], DOB:  [birth year], LOS: 4 ADMISSION DATE:  09/27/2024, CONSULTATION DATE:  11/24 REFERRING MD:  EDP CHIEF COMPLAINT:  ARDS   History of Present Illness:  Bryce Martinez is a 73 year old male with a pertinent past medical history of hypertension, hyperlipidemia, arthritis, GERD, and history of  hepatitis C s/p treatment and sustained virologic response with advanced fibrosis who presented to [facility] this morning for a routine screening colonoscopy. During the procedure, it was noted by anesthesia and gastroenterology that the patient had a clear/slightly bilious emesis during the procedure. He has had prior colonoscopies without complication. He was monitored after the procedure to be saturating 93% on room air.  Upon discharge, patient denied pain, SOB, or cough. Shortly after returning home, the patient noticed pain to the middle of his back and abdomen, felt SOB, and could not breathe deeply. Took 2 Pepto Bismol without relief. Pt's wife was advised to take him to [facility] ED, where he was placed on NRB at 15L and SpO2 remained at 92%.  CXR was done and showed pneumonia in the left lung and possible pleural effusion. Patient then placed on BiPAP and HFNC, however remained dyspneic and tachypneic. Denies further N/V since procedure. Patient was ultimately intubated for airway.   PCCM was consulted for transfer to [facility] and admission for further management. Pertinent  Medical History   Past Medical History:  Diagnosis Date   Allergy    Arthritis    ED (erectile dysfunction)    GERD (gastroesophageal reflux disease)    Occasionally   Hepatitis C    Hyperlipidemia    Hypertension    Significant Hospital Events: Including procedures, antibiotic start and stop dates in addition to other pertinent events   11/24 -Colonoscopy at [facility] with Dr. Eartha Flavors, postop O2 requirements showing 93% on RA at discharge. 11/24- Trialed 15L  NRB, BiPAP without respiratory improvement. PCCM consulted for admission and worsening respiratory status,  intubated in [facility] ED.  11/24- Left radial arterial line placed  11/24- 2030, CVC was placed for increased Levophed  requirements 11/28- Failed SBT, will switch to Precedex for sedation to improve respiratory drive  Interim History / Subjective:  Patient became restless overnight and required propofol  to be increased from 10 to 40mcg. Vent settings remained stable and BP remained 110-120s systolic. Otherwise, no acute events.  Objective   Blood pressure 115/76, pulse 72, temperature 99.6 F (37.6 C), temperature source Oral, resp. rate (!) 21, height 6' 1 (1.854 m), weight 81.1 kg, SpO2 100%.    Vent Mode: PRVC FiO2 (%):  [40 %] 40 % Set Rate:  [14 bmp] 14 bmp Vt Set:  [500 mL] 500 mL PEEP:  [5 cmH20] 5 cmH20 Plateau Pressure:  [16 cmH20-23 cmH20] 20 cmH20   Intake/Output Summary (Last 24 hours) at 10/01/2024 1054 Last data filed at 10/01/2024 1000 Gross per 24 hour  Intake 1831.84 ml  Output 3175 ml  Net -1343.16 ml   Filed Weights   09/29/24 0500 09/30/24 0500 10/01/24 0415  Weight: 82.1 kg 81.6 kg 81.1 kg   Examination: General: toxic appearing male, diaphoretic, NAD HENT: [place], PEERLA, ETT in place with bloody secretions. OG tube in place in right nare Lungs: MV, clear to auscultation bilaterally, no crackles or wheezing. Cardiovascular: RRR, no murmurs/rubs/gallops, no JVD Abdomen: soft, bowel sounds distant, nondistended, no rigidity or masses. Extremities: Warm, no edema. DP/PT pulses bilaterally diminished. Radial pulses 1+. Neuro: intubated and deeply sedated.  Responding to voice, touch, and stimuli. Following commands.  GU: Foley placed with yellow urine output  Pertinent labs, imaging, and tests in the past 24 hours: Na: 138 K: 3.8 BUN/Cr: 38/1.24 Cl: 102 Plts: 111 Mag: 2.7 Phosphorus: 1.2 BG: 158 H/H: 10.5/31.0 WBC: 32.1 Lactic acid: 4.8>3.2>2.2  ABG  11/28 0508 pH 7. 474 pCO2 38.3  pO2 84 bicarb 27.9   Resolved problem list   Assessment and Plan   Respiratory Acute Hypoxic Respiratory Failure 2/2 Severe Acute Respiratory Distress Syndrome from Acute Aspiration Pneumonia P: - PaO2/FiO2 ratio improving to 210.0 mmHg with PEEP = 5, indicating improving mild ARDS. - Vent settings overnight FIO2 40%, PEEP 5, RR 14, TV 500. Maintain lung protection ventilation of 6cc/kg of ideal body weight to prevent volutrauma or barotrauma. Will trial SAT/SBT today. - Consider proning patient if FiO2/PEEP/EPO is maxed out and refractory hypoxemia. - Continue stress dose steroids with 5 days of Decadron  20mg  (D1= 11/25-11/29), followed by 5 days of Decadron  10mg  (D1= 11/30-12/4) to cover for ARDS and septic shock. - VAP protocol - Will discontinue IV Zosyn  (D1= 11/25-11/28) for narrowed aspiration pneumonia coverage and switch to IV CTX for 1 day, totaling 5 days of coverage. Trachea aspirate growing rare gram negative rods with few E. Coli.  - Bcx collected showing NGTD x 4 days  Infection History of Hepatitis C with Advanced Fibrosis P: - s/p treatment  - Currently fibrotic per elastography 02/2023. RUQ US  on 03/2024 WNL. - Continue to follow outpatient with GI, already established.  Cardiac Shock, presumed likely distributive 2/2 sepsis from aspiration pneumonia and hypovolemia P: - On Levophed  overnight maintaining MAPs 70-80s. - Will wean Levophed  off as tolerated to maintain MAP > 65.  - Maintained Propofol  overnight for sedation, will wean then stop to improve central respiratory drive and switch to Precedex infusion. RAAS -4 overnight. Will trial SAT/SBT today. - Echo showing LVEF 25-30% with global hypokinesis of left ventricle. Slight mitral valve regurgitation, but no other valvular abnormalities. New LVEF likely secondary to septic cardiomyopathy. Recommend repeating echo in outpatient setting to monitor for improvement in 2  months.  History of Hypertension P: - Hold home Losartan  100mg  and Spironolactone  25mg  daily while intubated  Renal AKI P: - Likely prerenal in the setting of combined renal hypoperfusion and initiation of pressor support - Cr today 1.24. Will continue to monitor daily.   Metabolic Anion Gap Metabolic Acidosis 2/2 Lactic Acidosis With Respiratory Alkalosis  P: - Likely secondary to lactic acidosis from tissue hypoperfusion, now with respiratory alkalosis  - Anion gap improving on repeat ABGs - Lactic acid 4.8 > 3.2 > 2.2 - Trend daily CMP, closely monitor lactic acid for improvement.   Electrolyte Abnormalities P: - Goal of K > 4 and Mag >2  - K this morning 3.8, Mag 2.7, and Phosphorus 1.2 - Kphos replacement today - Will replenish electrolytes daily as necessary  Endocrine: Type 2 Diabetes Mellitus P: -SSI  -Long-acting insulin  q4hr -CBG q 4hr  Nutrition: P: -OG tube, no secretions or overnight emesis -Will order CoreTrak with RD today for placement and continue tube feeds -Increase bowel regimen to Miralax  and Colace BID. If no BM, will order suppository   Tubes/Lines/Therapy Tubes: ETT, OG tube, foley catheter Lines: Right Triple-Lumen internal jugular CVC, Left radial arterial line, 3 PIVs (2 right, 1 left) DVT Ppx: [place] Heparin  GI Ppx: Pantroprazole 40mg  daily  Labs   CBC: Recent Labs  Lab 09/27/24 1106 09/28/24 0238 09/28/24  9541 09/29/24 0452 09/30/24 0508 09/30/24 0536 10/01/24 0417 10/01/24 0801  WBC 4.9 2.0*  --  17.7*  --  22.7*  --  32.1*  NEUTROABS 3.7  --   --   --   --   --   --   --   HGB 15.3 7.9*  12.2*   < > 13.7 20.7* 11.5* 10.5* 10.5*  HCT 48.0 23.5*  36.0*   < > 39.5 61.0* 32.5* 31.0* 30.1*  MCV 87.6 85.5  --  80.9  --  80.0  --  81.1  PLT 275 109*  --  152  --  111*  --  111*   < > = values in this interval not displayed.    Basic Metabolic Panel: Recent Labs  Lab 09/27/24 1106 09/28/24 0238 09/28/24 0347  09/28/24 0458 09/29/24 0452 09/29/24 1424 09/29/24 2035 09/30/24 0508 09/30/24 0536 10/01/24 0415 10/01/24 0417  NA 136   < > 136   < > 134*  --   --  133* 134* 138 138  K 3.7   < > 3.8   < > 3.9  --  3.6 3.5 3.7 3.8 3.9  CL 100  --  105  --  102  --   --   --  99 102  --   CO2 22  --  18*  --  19*  --   --   --  23 25  --   GLUCOSE 186*  --  269*  --  130*  --   --   --  160* 154*  --   BUN 20  --  19  --  20  --   --   --  27* 38*  --   CREATININE 1.26*  --  1.83*  --  1.47*  --   --   --  1.17 1.24  --   CALCIUM  9.9  --  8.2*  --  8.0*  --   --   --  7.9* 8.4*  --   MG  --   --   --   --   --  1.4* 1.6*  --  2.7* 2.7*  --   PHOS  --   --   --   --   --  2.4* 2.1*  --  2.2* 1.2*  --    < > = values in this interval not displayed.   GFR: Estimated Creatinine Clearance: 60.9 mL/min (by C-G formula based on SCr of 1.24 mg/dL). Recent Labs  Lab 09/28/24 0238 09/28/24 0351 09/29/24 0452 09/29/24 0652 09/29/24 0807 09/30/24 0536 10/01/24 0415 10/01/24 0801  WBC 2.0*  --  17.7*  --   --  22.7*  --  32.1*  LATICACIDVEN  --    < >  --  4.8* 4.8* 3.2* 2.2*  --    < > = values in this interval not displayed.    Liver Function Tests: Recent Labs  Lab 09/27/24 1106 09/29/24 0452 10/01/24 0415  AST 31 46* 35  ALT 37 25 26  ALKPHOS 61 40 76  BILITOT 2.0* 2.0* 0.9  PROT 8.1 5.8* 5.4*  ALBUMIN 4.7 2.5* 2.0*   Recent Labs  Lab 09/27/24 1106  LIPASE 26   No results for input(s): AMMONIA in the last 168 hours.  ABG    Component Value Date/Time   PHART 7.474 (H) 10/01/2024 0417   PCO2ART 38.3 10/01/2024 0417   PO2ART 84 10/01/2024 0417   HCO3  27.9 10/01/2024 0417   TCO2 29 10/01/2024 0417   ACIDBASEDEF 4.0 (H) 09/29/2024 0306   O2SAT 96 10/01/2024 0417     Coagulation Profile: No results for input(s): INR, PROTIME in the last 168 hours.  Cardiac Enzymes: No results for input(s): CKTOTAL, CKMB, CKMBINDEX, TROPONINI in the last 168  hours.  HbA1C: Hgb A1c MFr Bld  Date/Time Value Ref Range Status  09/16/2017 09:58 AM 5.5 4.8 - 5.6 % Final    Comment:             Prediabetes: 5.7 - 6.4          Diabetes: >6.4          Glycemic control for adults with diabetes: <7.0   05/20/2015 10:10 AM 5.8 (H) <5.7 % Final    Comment:                                                                           According to the ADA Clinical Practice Recommendations for 2011, when HbA1c is used as a screening test:     >=6.5%   Diagnostic of Diabetes Mellitus            (if abnormal result is confirmed)   5.7-6.4%   Increased risk of developing Diabetes Mellitus   References:Diagnosis and Classification of Diabetes Mellitus,Diabetes Care,2011,34(Suppl 1):S62-S69 and Standards of Medical Care in         Diabetes - 2011,Diabetes Care,2011,34 (Suppl 1):S11-S61.       CBG: Recent Labs  Lab 09/30/24 1526 09/30/24 1912 09/30/24 2311 10/01/24 0312 10/01/24 0733  GLUCAP 146* 137* 228* 158* 150*    Past Medical History:  He,  has a past medical history of Allergy, Arthritis, ED (erectile dysfunction), GERD (gastroesophageal reflux disease), Hepatitis C, Hyperlipidemia, and Hypertension.   Surgical History:   Past Surgical History:  Procedure Laterality Date   APPENDECTOMY     CHOLECYSTECTOMY N/A 04/25/2014   Procedure: LAPAROSCOPIC CHOLECYSTECTOMY;  Surgeon: Oneil DELENA Budge, MD;  Location: [facility] ORS;  Service: General;  Laterality: N/A;   COLONOSCOPY N/A 09/01/2019   Procedure: COLONOSCOPY;  Surgeon: Golda Claudis PENNER, MD;  Location: [facility] SUITE;  Service: Endoscopy;  Laterality: N/A;  930   COLONOSCOPY N/A 09/27/2024   Procedure: COLONOSCOPY;  Surgeon: Eartha Angelia Sieving, MD;  Location: [facility] SUITE;  Service: Gastroenterology;  Laterality: N/A;  7:30am, ASA 1-2   HEMICOLECTOMY Right    lap hand assisted   POLYPECTOMY  09/01/2019   Procedure: POLYPECTOMY;  Surgeon: Golda Claudis PENNER, MD;  Location: [facility];   Service: Endoscopy;;  colon   right shoulder surgery Right 03/2005   rotator cuff repair     Social History:   reports that he quit smoking about 52 years ago. His smoking use included cigarettes. He started smoking about 67 years ago. He has a 4.4 pack-year smoking history. He has been exposed to tobacco smoke. He has never used smokeless tobacco. He reports that he does not currently use alcohol. He reports that he does not use drugs.   Family History:  His family history includes Cancer in his father; Diabetes in his brother; Drug abuse in his brother; Hypertension in his brother, daughter, mother, sister,  and son.   Allergies Allergies  Allergen Reactions   Amlodipine  Other (See Comments)    unknown     Home Medications  Prior to Admission medications   Medication Sig Start Date End Date Taking? Authorizing Provider  losartan  (COZAAR ) 100 MG tablet TAKE 1 TABLET BY MOUTH EVERY DAY 03/24/24   Antonetta Rollene BRAVO, MD  meclizine  (ANTIVERT ) 25 MG tablet Take 1 tablet (25 mg total) by mouth 2 (two) times daily as needed for dizziness. 08/24/24   Tobie Suzzane POUR, MD  Misc. Devices MISC Blood pressure cuff/device - 1. ICD10: I10 08/24/24   Tobie Suzzane POUR, MD  spironolactone  (ALDACTONE ) 25 MG tablet TAKE 1 TABLET (25 MG TOTAL) BY MOUTH DAILY. 06/24/24   Antonetta Rollene BRAVO, MD     Critical care time: 68    Shonika Kolasinski, DO Internal Medicine Resident, PGY-1 Please contact the on call pager at [phone] for any urgent or emergent needs. 10:54 AM 10/01/2024

## 2024-10-01 NOTE — Progress Notes (Signed)
 [facility] ADULT ICU REPLACEMENT PROTOCOL   The patient does apply for the [facility] Adult ICU Electrolyte Replacment Protocol based on the criteria listed below:   1.Exclusion criteria: TCTS, ECMO, Dialysis, and Myasthenia Gravis patients 2. Is GFR >/= 30 ml/min? Yes.    Patient's GFR today is >60 3. Is SCr </= 2? Yes.   Patient's SCr is 1.24 mg/dL 4. Did SCr increase >/= 0.5 in 24 hours? No. 5.Pt's weight >40kg  Yes.   6. Abnormal electrolyte(s): Phos 1.2  7. Electrolytes replaced per protocol 8.  Call MD STAT for K+ </= 2.5, Phos </= 1, or Mag </= 1 Physician:  Fate Ole BIRCH [facility] 10/01/2024 5:28 AM

## 2024-10-01 NOTE — Procedures (Signed)
 Cortrak  Person Inserting Tube:  Sofya Moustafa T, RD Tube Type:  Cortrak - 43 inches Tube Size:  10 Tube Location:  Right nare Secured by: Bridle Initial Placement:  Gastric Technique Used to Measure Tube Placement:  Marking at nare/corner of mouth Cortrak Secured At:  66 cm Initial Placement Verification:  Cortrak device (Registered Dieticians Only)   Cortrak Tube Team Note:  Consult received to place a Cortrak feeding tube.   No x-ray is required. RN may begin using tube.   If the tube becomes dislodged please keep the tube and contact the Cortrak team at [email or web address] for replacement.  If after hours and replacement cannot be delayed, place a NG tube and confirm placement with an abdominal x-ray.    Trude Ned RD, LDN Contact via [employer].

## 2024-10-02 ENCOUNTER — Inpatient Hospital Stay (HOSPITAL_COMMUNITY)

## 2024-10-02 DIAGNOSIS — I5021 Acute systolic (congestive) heart failure: Secondary | ICD-10-CM | POA: Diagnosis not present

## 2024-10-02 DIAGNOSIS — J80 Acute respiratory distress syndrome: Secondary | ICD-10-CM | POA: Diagnosis not present

## 2024-10-02 DIAGNOSIS — A419 Sepsis, unspecified organism: Secondary | ICD-10-CM | POA: Diagnosis not present

## 2024-10-02 DIAGNOSIS — R6521 Severe sepsis with septic shock: Secondary | ICD-10-CM | POA: Diagnosis not present

## 2024-10-02 LAB — GLUCOSE, CAPILLARY
Glucose-Capillary: 141 mg/dL — ABNORMAL HIGH (ref 70–99)
Glucose-Capillary: 224 mg/dL — ABNORMAL HIGH (ref 70–99)
Glucose-Capillary: 175 mg/dL — ABNORMAL HIGH (ref 70–99)
Glucose-Capillary: 211 mg/dL — ABNORMAL HIGH (ref 70–99)
Glucose-Capillary: 140 mg/dL — ABNORMAL HIGH (ref 70–99)
Glucose-Capillary: 182 mg/dL — ABNORMAL HIGH (ref 70–99)

## 2024-10-02 LAB — CBC
HCT: 33.1 % — ABNORMAL LOW (ref 39.0–52.0)
Hemoglobin: 11.2 g/dL — ABNORMAL LOW (ref 13.0–17.0)
MCH: 28.4 pg (ref 26.0–34.0)
MCHC: 33.8 g/dL (ref 30.0–36.0)
MCV: 83.8 fL (ref 80.0–100.0)
Platelets: 115 K/uL — ABNORMAL LOW (ref 150–400)
RBC: 3.95 MIL/uL — ABNORMAL LOW (ref 4.22–5.81)
RDW: 13.7 % (ref 11.5–15.5)
WBC: 29 K/uL — ABNORMAL HIGH (ref 4.0–10.5)
nRBC: 0.3 % — ABNORMAL HIGH (ref 0.0–0.2)

## 2024-10-02 LAB — COMPREHENSIVE METABOLIC PANEL WITH GFR
ALT: 31 U/L (ref 0–44)
AST: 39 U/L (ref 15–41)
Albumin: 2.2 g/dL — ABNORMAL LOW (ref 3.5–5.0)
Alkaline Phosphatase: 88 U/L (ref 38–126)
Anion gap: 10 (ref 5–15)
BUN: 50 mg/dL — ABNORMAL HIGH (ref 8–23)
CO2: 27 mmol/L (ref 22–32)
Calcium: 8.2 mg/dL — ABNORMAL LOW (ref 8.9–10.3)
Chloride: 105 mmol/L (ref 98–111)
Creatinine, Ser: 1.23 mg/dL (ref 0.61–1.24)
GFR, Estimated: >60 mL/min (ref >60)
Glucose, Bld: 196 mg/dL — ABNORMAL HIGH (ref 70–99)
Potassium: 3.9 mmol/L (ref 3.5–5.1)
Sodium: 142 mmol/L (ref 135–145)
Total Bilirubin: 0.6 mg/dL (ref 0.0–1.2)
Total Protein: 5.9 g/dL — ABNORMAL LOW (ref 6.5–8.1)

## 2024-10-02 LAB — MAGNESIUM: Magnesium: 2.3 mg/dL (ref 1.7–2.4)

## 2024-10-02 LAB — CULTURE, BLOOD (ROUTINE X 2)
Culture: NO GROWTH
Culture: NO GROWTH

## 2024-10-02 LAB — PHOSPHORUS: Phosphorus: 3.3 mg/dL (ref 2.5–4.6)

## 2024-10-02 LAB — LACTIC ACID, PLASMA: Lactic Acid, Venous: 1.8 mmol/L (ref 0.5–1.9)

## 2024-10-02 MED ORDER — SIMETHICONE 40 MG/0.6ML PO SUSP
40.0000 mg | Freq: Once | ORAL | Status: AC
  Administered 2024-10-02: 40 mg
  Filled 2024-10-02: qty 0.6

## 2024-10-02 MED ORDER — INSULIN GLARGINE-YFGN 100 UNIT/ML ~~LOC~~ SOLN
8.0000 [IU] | Freq: Every day | SUBCUTANEOUS | Status: DC
  Administered 2024-10-02 – 2024-10-11 (×10): 8 [IU] via SUBCUTANEOUS
  Filled 2024-10-02 (×11): qty 0.08

## 2024-10-02 MED ORDER — FUROSEMIDE 10 MG/ML IJ SOLN: 40.0000 mg | Freq: Once | INTRAMUSCULAR | Status: DC

## 2024-10-02 MED ORDER — SENNA 8.6 MG PO TABS
1.0000 | ORAL_TABLET | Freq: Every day | ORAL | Status: DC
  Administered 2024-10-02 – 2024-10-03 (×2): 8.6 mg via ORAL
  Filled 2024-10-02 (×2): qty 1

## 2024-10-02 MED ORDER — FUROSEMIDE 10 MG/ML IJ SOLN
80.0000 mg | Freq: Once | INTRAMUSCULAR | Status: AC
  Administered 2024-10-02: 80 mg via INTRAVENOUS
  Filled 2024-10-02: qty 8

## 2024-10-02 NOTE — Plan of Care (Signed)
   Problem: Clinical Measurements: Goal: Ability to maintain clinical measurements within normal limits will improve Outcome: Progressing Goal: Will remain free from infection Outcome: Progressing   Problem: Nutrition: Goal: Adequate nutrition will be maintained Outcome: Progressing

## 2024-10-02 NOTE — Progress Notes (Signed)
 NAME:  Bryce Martinez, MRN:  [id number], DOB:  [birth year], LOS: 5 ADMISSION DATE:  09/27/2024, CONSULTATION DATE:  11/24 REFERRING MD:  EDP CHIEF COMPLAINT:  ARDS   History of Present Illness:  Bryce Martinez is a 73 year old male with a pertinent past medical history of hypertension, hyperlipidemia, arthritis, GERD, and history of  hepatitis C s/p treatment and sustained virologic response with advanced fibrosis who presented to [facility] this morning for a routine screening colonoscopy. During the procedure, it was noted by anesthesia and gastroenterology that the patient had a clear/slightly bilious emesis during the procedure. He has had prior colonoscopies without complication. He was monitored after the procedure to be saturating 93% on room air.  Upon discharge, patient denied pain, SOB, or cough. Shortly after returning home, the patient noticed pain to the middle of his back and abdomen, felt SOB, and could not breathe deeply. Took 2 Pepto Bismol without relief. Pt's wife was advised to take him to [facility] ED, where he was placed on NRB at 15L and SpO2 remained at 92%.  CXR was done and showed pneumonia in the left lung and possible pleural effusion. Patient then placed on BiPAP and HFNC, however remained dyspneic and tachypneic. Denies further N/V since procedure. Patient was ultimately intubated for airway.   PCCM was consulted for transfer to [facility] and admission for further management. Pertinent  Medical History   Past Medical History:  Diagnosis Date   Allergy    Arthritis    ED (erectile dysfunction)    GERD (gastroesophageal reflux disease)    Occasionally   Hepatitis C    Hyperlipidemia    Hypertension    Significant Hospital Events: Including procedures, antibiotic start and stop dates in addition to other pertinent events   11/24 -Colonoscopy at [facility] with Dr. Eartha Flavors, postop O2 requirements showing 93% on RA at discharge. 11/24- Trialed 15L  NRB, BiPAP without respiratory improvement. PCCM consulted for admission and worsening respiratory status,  intubated in [facility] ED.  11/24- Left radial arterial line placed  11/24- 2030, CVC was placed for increased Levophed  requirements 11/28- Failed SBT, will switch to Precedex for sedation to improve respiratory drive  Interim History / Subjective:  No documented overnight events, however patient's wife notes new abdominal distension and a gurgling burping sound.   Objective   Blood pressure (!) 162/88, pulse 77, temperature 100 F (37.8 C), temperature source Oral, resp. rate (!) 26, height 6' 1 (1.854 m), weight 80.6 kg, SpO2 99%.    Vent Mode: PRVC FiO2 (%):  [40 %] 40 % Set Rate:  [14 bmp] 14 bmp Vt Set:  [500 mL] 500 mL PEEP:  [5 cmH20] 5 cmH20 Plateau Pressure:  [17 cmH20-20 cmH20] 17 cmH20   Intake/Output Summary (Last 24 hours) at 10/02/2024 0844 Last data filed at 10/02/2024 0825 Gross per 24 hour  Intake 2395.08 ml  Output 2740 ml  Net -344.92 ml   Filed Weights   09/30/24 0500 10/01/24 0415 10/02/24 0500  Weight: 81.6 kg 81.1 kg 80.6 kg   Examination: General: toxic appearing male, diaphoretic, NAD HENT: [place], [place]ER[place], ETT in place with light-pink secretions. OG tube in place in right nare Lungs: MV, clear to auscultation bilaterally, right lung with some crackles, no wheezing. Cardiovascular: RRR, no murmurs/rubs/gallops, no JVD Abdomen: soft, distended with positive bowel sounds, no tenderness, no masses Extremities: Warm, no edema. DP/PT pulses bilaterally diminished. Radial pulses 1+. Neuro: intubated and deeply sedated. Responding to voice, touch, and  stimuli. Following commands.  GU: Foley placed with yellow urine output  Pertinent labs, imaging, and tests in the past 24 hours: Na: 142 K: 3.9 BUN/Cr: 50/1.23 Cl: 105 Plts: 115 Mag: 2.3 Phosphorus: 3.3 BG: 141 H/H: 11.2/33.1 WBC: 29.0. Lactic acid: 2.2> 1.8  Resolved problem list   Assessment and  Plan   Respiratory Acute Hypoxic Respiratory Failure 2/2 Severe Acute Respiratory Distress Syndrome from Acute Aspiration Pneumonia P: - PaO2/FiO2 ratio improving to 210.0 mmHg with PEEP = 5, indicating improving mild ARDS. - Vent settings overnight FIO2 40%, PEEP 5, RR 14, TV 500. Maintain lung protection ventilation of 6cc/kg of ideal body weight to prevent volutrauma or barotrauma.  - Failed SBT/SAT today, will give Lasix 80mg  to help diurese pulmonary fluid contributing to his tachypnea when trialing SBT - Continue stress dose steroids with 5 days of Decadron  20mg  (D1= 11/25-11/29), followed by 5 days of Decadron  10mg  (D1= 11/30-12/4) to cover for ARDS and septic shock. - VAP protocol - Continue IV CTX  (D1= 11/28-11/29) for narrowed aspiration pneumonia coverage, totaling 5 days of coverage. Trachea aspirate growing rare gram negative rods with few E. Coli.  - Bcx collected showing NGTD x 5 days  Infection History of Hepatitis C with Advanced Fibrosis P: - s/p treatment  - Currently fibrotic per elastography 02/2023. RUQ US  on 03/2024 WNL. - Continue to follow outpatient with GI, already established.  Cardiac Shock, presumed likely distributive 2/2 sepsis from aspiration pneumonia and hypovolemia P: - Maintaining MAPs 70-80s off of pressors x 24hrs - On Precedex 0.2mcg for ventilator liberation. RAAS -1 overnight. Will trial SAT/SBT today. - Echo showing LVEF 25-30% with global hypokinesis of left ventricle. Slight mitral valve regurgitation, but no other valvular abnormalities. New LVEF likely secondary to septic cardiomyopathy. Recommend repeating echo in outpatient setting to monitor for improvement in 2 months.  History of Hypertension P: - Hold home Losartan  100mg  and Spironolactone  25mg  daily while intubated  Gastrointestinal: Abdominal Distension, acute P: - New within the past 24 hours, bowel sounds positive, recent normal BM 11/28 with subsequent watery stools  overnight on 11/29 - Likely secondary to positive pressure ventilation causing gaseous distension, but will order KUB to rule out ileus or acute colonic pseudo-obstruction - KUB showing mild distension of bowel, with greatest loop measuring about 6.4cm - Add senna for stimulatory agent and will d/c Miralax  for now given osmotic agents worsening distension - Will add simethicone  for gas, monitor BMs  Renal AKI P: - Likely prerenal in the setting of combined renal hypoperfusion and initiation of pressor support - Cr today 1.23. Will continue to monitor daily.   Metabolic Anion Gap Metabolic Acidosis 2/2 Lactic Acidosis, Resolved With Respiratory Alkalosis  P: - Likely secondary to lactic acidosis from tissue hypoperfusion, now with respiratory alkalosis  - Anion gap improving on repeat ABGs - Lactic acid 3.2 > 2.2 > 1.8 - Trend daily CMP, closely monitor lactic acid for improvement.   Electrolyte Abnormalities P: - Goal of K > 4 and Mag >2  - K this morning 3.9, Mag 2.3, and Phosphorus 3.3 - Will replenish electrolytes daily as necessary  Endocrine: Type 2 Diabetes Mellitus P: -SSI  -Short-acting insulin  q4hr -Semglee 8u daily  -CBG q 4hr  Nutrition: P: - OG tube, no secretions or overnight emesis - CoreTrak for tube feeds  Tubes/Lines/Therapy Tubes: ETT, OG tube, foley catheter Lines: Right Triple-Lumen internal jugular CVC, Left radial arterial line, 3 PIVs (2 right, 1 left) DVT Ppx: [place] Heparin  GI  Ppx: Pantroprazole 40mg  daily  Labs   CBC: Recent Labs  Lab 09/27/24 1106 09/28/24 0238 09/28/24 0458 09/29/24 0452 09/30/24 0508 09/30/24 0536 10/01/24 0417 10/01/24 0801 10/02/24 0500  WBC 4.9 2.0*  --  17.7*  --  22.7*  --  32.1* 29.0*  NEUTROABS 3.7  --   --   --   --   --   --   --   --   HGB 15.3 7.9*  12.2*   < > 13.7 20.7* 11.5* 10.5* 10.5* 11.2*  HCT 48.0 23.5*  36.0*   < > 39.5 61.0* 32.5* 31.0* 30.1* 33.1*  MCV 87.6 85.5  --  80.9  --  80.0  --   81.1 83.8  PLT 275 109*  --  152  --  111*  --  111* 115*   < > = values in this interval not displayed.    Basic Metabolic Panel: Recent Labs  Lab 09/28/24 0347 09/28/24 0458 09/29/24 0452 09/29/24 1424 09/29/24 1424 09/29/24 2035 09/30/24 0508 09/30/24 0536 10/01/24 0415 10/01/24 0417 10/01/24 2236 10/02/24 0500  NA 136   < > 134*  --   --   --  133* 134* 138 138  --  142  K 3.8   < > 3.9  --   --  3.6 3.5 3.7 3.8 3.9  --  3.9  CL 105  --  102  --   --   --   --  99 102  --   --  105  CO2 18*  --  19*  --   --   --   --  23 25  --   --  27  GLUCOSE 269*  --  130*  --   --   --   --  160* 154*  --   --  196*  BUN 19  --  20  --   --   --   --  27* 38*  --   --  50*  CREATININE 1.83*  --  1.47*  --   --   --   --  1.17 1.24  --   --  1.23  CALCIUM  8.2*  --  8.0*  --   --   --   --  7.9* 8.4*  --   --  8.2*  MG  --   --   --  1.4*  --  1.6*  --  2.7* 2.7*  --   --  2.3  PHOS  --   --   --  2.4*   < > 2.1*  --  2.2* 1.2*  --  2.4* 3.3   < > = values in this interval not displayed.   GFR: Estimated Creatinine Clearance: 61.4 mL/min (by C-G formula based on SCr of 1.23 mg/dL). Recent Labs  Lab 09/29/24 0452 09/29/24 0652 09/29/24 0807 09/30/24 0536 10/01/24 0415 10/01/24 0801 10/02/24 0500  WBC 17.7*  --   --  22.7*  --  32.1* 29.0*  LATICACIDVEN  --    < > 4.8* 3.2* 2.2*  --  1.8   < > = values in this interval not displayed.    Liver Function Tests: Recent Labs  Lab 09/27/24 1106 09/29/24 0452 10/01/24 0415 10/02/24 0500  AST 31 46* 35 39  ALT 37 25 26 31   ALKPHOS 61 40 76 88  BILITOT 2.0* 2.0* 0.9 0.6  PROT 8.1 5.8* 5.4* 5.9*  ALBUMIN 4.7 2.5* 2.0* 2.2*  Recent Labs  Lab 09/27/24 1106  LIPASE 26   No results for input(s): AMMONIA in the last 168 hours.  ABG    Component Value Date/Time   PHART 7.474 (H) 10/01/2024 0417   PCO2ART 38.3 10/01/2024 0417   PO2ART 84 10/01/2024 0417   HCO3 27.9 10/01/2024 0417   TCO2 29 10/01/2024 0417    ACIDBASEDEF 4.0 (H) 09/29/2024 0306   O2SAT 96 10/01/2024 0417     Coagulation Profile: No results for input(s): INR, PROTIME in the last 168 hours.  Cardiac Enzymes: No results for input(s): CKTOTAL, CKMB, CKMBINDEX, TROPONINI in the last 168 hours.  HbA1C: Hgb A1c MFr Bld  Date/Time Value Ref Range Status  09/16/2017 09:58 AM 5.5 4.8 - 5.6 % Final    Comment:             Prediabetes: 5.7 - 6.4          Diabetes: >6.4          Glycemic control for adults with diabetes: <7.0   05/20/2015 10:10 AM 5.8 (H) <5.7 % Final    Comment:                                                                           According to the ADA Clinical Practice Recommendations for 2011, when HbA1c is used as a screening test:     >=6.5%   Diagnostic of Diabetes Mellitus            (if abnormal result is confirmed)   5.7-6.4%   Increased risk of developing Diabetes Mellitus   References:Diagnosis and Classification of Diabetes Mellitus,Diabetes Care,2011,34(Suppl 1):S62-S69 and Standards of Medical Care in         Diabetes - 2011,Diabetes Care,2011,34 (Suppl 1):S11-S61.       CBG: Recent Labs  Lab 10/01/24 1538 10/01/24 1915 10/01/24 2311 10/02/24 0312 10/02/24 0721  GLUCAP 231* 188* 129* 141* 224*    Past Medical History:  He,  has a past medical history of Allergy, Arthritis, ED (erectile dysfunction), GERD (gastroesophageal reflux disease), Hepatitis C, Hyperlipidemia, and Hypertension.   Surgical History:   Past Surgical History:  Procedure Laterality Date   APPENDECTOMY     CHOLECYSTECTOMY N/A 04/25/2014   Procedure: LAPAROSCOPIC CHOLECYSTECTOMY;  Surgeon: Oneil DELENA Budge, MD;  Location: [facility] ORS;  Service: General;  Laterality: N/A;   COLONOSCOPY N/A 09/01/2019   Procedure: COLONOSCOPY;  Surgeon: Golda Claudis PENNER, MD;  Location: [facility];  Service: Endoscopy;  Laterality: N/A;  930   COLONOSCOPY N/A 09/27/2024   Procedure: COLONOSCOPY;  Surgeon: Eartha Angelia Sieving, MD;  Location: [facility];  Service: Gastroenterology;  Laterality: N/A;  7:30am, ASA 1-2   HEMICOLECTOMY Right    lap hand assisted   POLYPECTOMY  09/01/2019   Procedure: POLYPECTOMY;  Surgeon: Golda Claudis PENNER, MD;  Location: [facility];  Service: Endoscopy;;  colon   right shoulder surgery Right 03/2005   rotator cuff repair     Social History:   reports that he quit smoking about 52 years ago. His smoking use included cigarettes. He started smoking about 67 years ago. He has a 4.4 pack-year smoking history. He has been exposed to tobacco smoke. He  has never used smokeless tobacco. He reports that he does not currently use alcohol. He reports that he does not use drugs.   Family History:  His family history includes Cancer in his father; Diabetes in his brother; Drug abuse in his brother; Hypertension in his brother, daughter, mother, sister, and son.   Allergies Allergies  Allergen Reactions   Amlodipine  Other (See Comments)    unknown     Home Medications  Prior to Admission medications   Medication Sig Start Date End Date Taking? Authorizing Provider  losartan  (COZAAR ) 100 MG tablet TAKE 1 TABLET BY MOUTH EVERY DAY 03/24/24   Antonetta Rollene BRAVO, MD  meclizine  (ANTIVERT ) 25 MG tablet Take 1 tablet (25 mg total) by mouth 2 (two) times daily as needed for dizziness. 08/24/24   Tobie Suzzane POUR, MD  Misc. Devices MISC Blood pressure cuff/device - 1. ICD10: I10 08/24/24   Tobie Suzzane POUR, MD  spironolactone  (ALDACTONE ) 25 MG tablet TAKE 1 TABLET (25 MG TOTAL) BY MOUTH DAILY. 06/24/24   Antonetta Rollene BRAVO, MD     Critical care time: 82    Aubrianna Orchard, DO Internal Medicine Resident, PGY-1 Please contact the on call pager at [phone] for any urgent or emergent needs. 8:44 AM 10/02/2024

## 2024-10-03 DIAGNOSIS — E119 Type 2 diabetes mellitus without complications: Secondary | ICD-10-CM

## 2024-10-03 DIAGNOSIS — A419 Sepsis, unspecified organism: Secondary | ICD-10-CM | POA: Diagnosis not present

## 2024-10-03 DIAGNOSIS — R6521 Severe sepsis with septic shock: Secondary | ICD-10-CM | POA: Diagnosis not present

## 2024-10-03 DIAGNOSIS — N179 Acute kidney failure, unspecified: Secondary | ICD-10-CM

## 2024-10-03 DIAGNOSIS — J80 Acute respiratory distress syndrome: Secondary | ICD-10-CM | POA: Diagnosis not present

## 2024-10-03 DIAGNOSIS — I5021 Acute systolic (congestive) heart failure: Secondary | ICD-10-CM | POA: Diagnosis not present

## 2024-10-03 LAB — MAGNESIUM: Magnesium: 2.4 mg/dL (ref 1.7–2.4)

## 2024-10-03 LAB — COMPREHENSIVE METABOLIC PANEL WITH GFR
ALT: 29 U/L (ref 0–44)
AST: 34 U/L (ref 15–41)
Albumin: 2.2 g/dL — ABNORMAL LOW (ref 3.5–5.0)
Alkaline Phosphatase: 92 U/L (ref 38–126)
Anion gap: 9 (ref 5–15)
BUN: 52 mg/dL — ABNORMAL HIGH (ref 8–23)
CO2: 29 mmol/L (ref 22–32)
Calcium: 8.2 mg/dL — ABNORMAL LOW (ref 8.9–10.3)
Chloride: 103 mmol/L (ref 98–111)
Creatinine, Ser: 1.14 mg/dL (ref 0.61–1.24)
GFR, Estimated: >60 mL/min (ref >60)
Glucose, Bld: 168 mg/dL — ABNORMAL HIGH (ref 70–99)
Potassium: 4.2 mmol/L (ref 3.5–5.1)
Sodium: 141 mmol/L (ref 135–145)
Total Bilirubin: 0.6 mg/dL (ref 0.0–1.2)
Total Protein: 5.7 g/dL — ABNORMAL LOW (ref 6.5–8.1)

## 2024-10-03 LAB — CBC
HCT: 34.2 % — ABNORMAL LOW (ref 39.0–52.0)
Hemoglobin: 11.3 g/dL — ABNORMAL LOW (ref 13.0–17.0)
MCH: 28 pg (ref 26.0–34.0)
MCHC: 33 g/dL (ref 30.0–36.0)
MCV: 84.7 fL (ref 80.0–100.0)
Platelets: 137 K/uL — ABNORMAL LOW (ref 150–400)
RBC: 4.04 MIL/uL — ABNORMAL LOW (ref 4.22–5.81)
RDW: 13.7 % (ref 11.5–15.5)
WBC: 26.3 K/uL — ABNORMAL HIGH (ref 4.0–10.5)
nRBC: 0.3 % — ABNORMAL HIGH (ref 0.0–0.2)

## 2024-10-03 LAB — GLUCOSE, CAPILLARY
Glucose-Capillary: 156 mg/dL — ABNORMAL HIGH (ref 70–99)
Glucose-Capillary: 151 mg/dL — ABNORMAL HIGH (ref 70–99)
Glucose-Capillary: 88 mg/dL (ref 70–99)
Glucose-Capillary: 225 mg/dL — ABNORMAL HIGH (ref 70–99)
Glucose-Capillary: 208 mg/dL — ABNORMAL HIGH (ref 70–99)
Glucose-Capillary: 206 mg/dL — ABNORMAL HIGH (ref 70–99)

## 2024-10-03 LAB — PHOSPHORUS: Phosphorus: 2.7 mg/dL (ref 2.5–4.6)

## 2024-10-03 MED ORDER — FUROSEMIDE 10 MG/ML IJ SOLN
40.0000 mg | Freq: Two times a day (BID) | INTRAMUSCULAR | Status: DC
  Filled 2024-10-03: qty 4

## 2024-10-03 MED ORDER — CLONAZEPAM 0.1 MG/ML ORAL SUSPENSION
0.5000 mg | Freq: Two times a day (BID) | ORAL | Status: DC
  Administered 2024-10-03 – 2024-10-04 (×3): 0.5 mg
  Filled 2024-10-03 (×3): qty 5

## 2024-10-03 MED ORDER — FUROSEMIDE 10 MG/ML IJ SOLN
40.0000 mg | Freq: Two times a day (BID) | INTRAMUSCULAR | Status: AC
  Administered 2024-10-03 (×2): 40 mg via INTRAVENOUS
  Filled 2024-10-03: qty 4

## 2024-10-03 MED ORDER — SENNA 8.6 MG PO TABS
1.0000 | ORAL_TABLET | Freq: Every day | ORAL | Status: DC
  Administered 2024-10-04 – 2024-10-09 (×5): 8.6 mg
  Filled 2024-10-03 (×5): qty 1

## 2024-10-03 MED ORDER — GUAIFENESIN 100 MG/5ML PO LIQD
10.0000 mL | Freq: Two times a day (BID) | ORAL | Status: DC
  Administered 2024-10-03 – 2024-10-10 (×14): 10 mL
  Filled 2024-10-03 (×2): qty 10
  Filled 2024-10-03 (×2): qty 15
  Filled 2024-10-03: qty 10
  Filled 2024-10-03 (×2): qty 15
  Filled 2024-10-03: qty 10
  Filled 2024-10-03: qty 15
  Filled 2024-10-03: qty 10
  Filled 2024-10-03 (×2): qty 15
  Filled 2024-10-03: qty 10
  Filled 2024-10-03: qty 15

## 2024-10-03 NOTE — Plan of Care (Signed)
  Problem: Health Behavior/Discharge Planning: Goal: Ability to manage health-related needs will improve Outcome: Progressing   Problem: Clinical Measurements: Goal: Diagnostic test results will improve Outcome: Progressing Goal: Respiratory complications will improve Outcome: Progressing   

## 2024-10-03 NOTE — Progress Notes (Signed)
 NAME:  Bryce Martinez, MRN:  [id number], DOB:  [birth year], LOS: 6 ADMISSION DATE:  09/27/2024, CONSULTATION DATE:  11/24 REFERRING MD:  EDP CHIEF COMPLAINT:  ARDS   History of Present Illness:  Bryce Martinez is a 73 year old male with a pertinent past medical history of hypertension, hyperlipidemia, arthritis, GERD, and history of  hepatitis C s/p treatment and sustained virologic response with advanced fibrosis who presented to [facility] this morning for a routine screening colonoscopy. During the procedure, it was noted by anesthesia and gastroenterology that the patient had a clear/slightly bilious emesis during the procedure. He has had prior colonoscopies without complication. He was monitored after the procedure to be saturating 93% on room air.  Upon discharge, patient denied pain, SOB, or cough. Shortly after returning home, the patient noticed pain to the middle of his back and abdomen, felt SOB, and could not breathe deeply. Took 2 Pepto Bismol without relief. Pt's wife was advised to take him to [facility] ED, where he was placed on NRB at 15L and SpO2 remained at 92%.  CXR was done and showed pneumonia in the left lung and possible pleural effusion. Patient then placed on BiPAP and HFNC, however remained dyspneic and tachypneic. Denies further N/V since procedure. Patient was ultimately intubated for airway.   PCCM was consulted for transfer to [facility] and admission for further management. Pertinent  Medical History   Past Medical History:  Diagnosis Date   Allergy    Arthritis    ED (erectile dysfunction)    GERD (gastroesophageal reflux disease)    Occasionally   Hepatitis C    Hyperlipidemia    Hypertension    Significant Hospital Events: Including procedures, antibiotic start and stop dates in addition to other pertinent events   11/24 -Colonoscopy at [facility] with Dr. Eartha Flavors, postop O2 requirements showing 93% on RA at discharge. 11/24- Trialed 15L  NRB, BiPAP without respiratory improvement. PCCM consulted for admission and worsening respiratory status,  intubated in [facility] ED.  11/24- Left radial arterial line placed  11/24- 2030, CVC was placed for increased Levophed  requirements 11/28- Failed SBT, will switch to Precedex for sedation to improve respiratory drive  Interim History / Subjective:  No overnight issues. Reports anxiety. Doing ok on precedex.   Objective   Blood pressure (!) 142/96, pulse 92, temperature 99.8 F (37.7 C), temperature source Axillary, resp. rate (!) 24, height 6' 1 (1.854 m), weight 77.2 kg, SpO2 100%. CVP:  [0 mmHg-29 mmHg] 4 mmHg  Vent Mode: PRVC FiO2 (%):  [40 %] 40 % Set Rate:  [14 bmp] 14 bmp Vt Set:  [500 mL] 500 mL PEEP:  [5 cmH20] 5 cmH20 Plateau Pressure:  [19 cmH20-22 cmH20] 19 cmH20   Intake/Output Summary (Last 24 hours) at 10/03/2024 1746 Last data filed at 10/03/2024 1700 Gross per 24 hour  Intake 1459.34 ml  Output 3835 ml  Net -2375.66 ml   Filed Weights   10/01/24 0415 10/02/24 0500 10/03/24 0500  Weight: 81.1 kg 80.6 kg 77.2 kg   Examination: Gen:      Intubated, sedated, acutely ill appearing HEENT:  ETT to vent Lungs:    sounds of mechanical ventilation auscultated no wheeze CV:         tachycardic, regular Abd:      + bowel sounds; soft, non-tender; no palpable masses, no distension Ext:    No edema Skin:      Warm and dry; no rashes Neuro:   sedated  on precedex, RASS -1, moves all 4 extremities, weak  Chest xray yesterday shows left atelectasis vs effusion Bedside ultrasound of the left pleural space confirms more atelctasis, no discernable pocket for drainage   Pertinent labs, imaging, and tests in the past 24 hours:   Resolved problem list  AGMA Shock  Assessment and Plan   Respiratory Acute Hypoxic Respiratory Failure 2/2 Severe Acute Respiratory Distress Syndrome from Acute Aspiration Pneumonia P: - PaO2/FiO2 ratio improving to 210.0 mmHg with PEEP = 5,  indicating improving mild ARDS. - Vent settings overnight FIO2 40%, PEEP 5, RR 14, TV 500. Maintain lung protection ventilation of 6cc/kg of ideal body weight to prevent volutrauma or barotrauma.  - Failed SBT for tachypnea today - Continue stress dose steroids with 5 days of Decadron  20mg  (D1= 11/25-11/29), followed by 5 days of Decadron  10mg  (D1= 11/30-12/4) to cover for ARDS and septic shock. - VAP protocol -complete 5 days ceftriaxone - barrier to extubation today was his tachypnia - I'm worried he'll tire out. Diuresis. Will schedule clonazepam and mucolytics to facilitate ventilator liberation. Hopefully can extubate 12/1 - Bcx collected showing NGTD x 5 days  Infection History of Hepatitis C with Advanced Fibrosis P: - s/p treatment  - Currently fibrotic per elastography 02/2023. RUQ US  on 03/2024 WNL. - Continue to follow outpatient with GI, already established.  Cardiac Acute HFrEF EF 25-30%, suspect septic cardiomyopathy Shock is resolved - diurese - will need f/u echo as outpatient  History of Hypertension P: - Hold home Losartan  100mg  and Spironolactone  25mg  daily while intubated  Gastrointestinal: Abdominal Distension P: - daily BM - continue Bowel regimen - continue simethicone   Renal AKI, improving with diuresis P: - Cr 1.14 today  Endocrine: Type 2 Diabetes Mellitus P: -SSI  -Short-acting insulin  q4hr -Semglee 8u daily  -CBG q 4hr  Nutrition: P: - OG tube, no secretions or overnight emesis - CoreTrak for tube feeds  Tubes/Lines/Therapy Tubes: ETT, OG tube, foley catheter Lines: Right Triple-Lumen internal jugular CVC, Left radial arterial line, 3 PIVs (2 right, 1 left) DVT Ppx: [place] Heparin  GI Ppx: Pantroprazole 40mg  daily  Labs   CBC: Recent Labs  Lab 09/27/24 1106 09/28/24 0238 09/29/24 0452 09/30/24 0508 09/30/24 0536 10/01/24 0417 10/01/24 0801 10/02/24 0500 10/03/24 0539  WBC 4.9   < > 17.7*  --  22.7*  --  32.1* 29.0* 26.3*   NEUTROABS 3.7  --   --   --   --   --   --   --   --   HGB 15.3   < > 13.7   < > 11.5* 10.5* 10.5* 11.2* 11.3*  HCT 48.0   < > 39.5   < > 32.5* 31.0* 30.1* 33.1* 34.2*  MCV 87.6   < > 80.9  --  80.0  --  81.1 83.8 84.7  PLT 275   < > 152  --  111*  --  111* 115* 137*   < > = values in this interval not displayed.    Basic Metabolic Panel: Recent Labs  Lab 09/29/24 0452 09/29/24 1424 09/29/24 2035 09/30/24 0508 09/30/24 0536 10/01/24 0415 10/01/24 0417 10/01/24 2236 10/02/24 0500 10/03/24 0539  NA 134*  --   --    < > 134* 138 138  --  142 141  K 3.9  --  3.6   < > 3.7 3.8 3.9  --  3.9 4.2  CL 102  --   --   --  99 102  --   --  105 103  CO2 19*  --   --   --  23 25  --   --  27 29  GLUCOSE 130*  --   --   --  160* 154*  --   --  196* 168*  BUN 20  --   --   --  27* 38*  --   --  50* 52*  CREATININE 1.47*  --   --   --  1.17 1.24  --   --  1.23 1.14  CALCIUM  8.0*  --   --   --  7.9* 8.4*  --   --  8.2* 8.2*  MG  --    < > 1.6*  --  2.7* 2.7*  --   --  2.3 2.4  PHOS  --    < > 2.1*  --  2.2* 1.2*  --  2.4* 3.3 2.7   < > = values in this interval not displayed.   GFR: Estimated Creatinine Clearance: 64 mL/min (by C-G formula based on SCr of 1.14 mg/dL). Recent Labs  Lab 09/29/24 0807 09/30/24 0536 10/01/24 0415 10/01/24 0801 10/02/24 0500 10/03/24 0539  WBC  --  22.7*  --  32.1* 29.0* 26.3*  LATICACIDVEN 4.8* 3.2* 2.2*  --  1.8  --     Liver Function Tests: Recent Labs  Lab 09/27/24 1106 09/29/24 0452 10/01/24 0415 10/02/24 0500 10/03/24 0539  AST 31 46* 35 39 34  ALT 37 25 26 31 29   ALKPHOS 61 40 76 88 92  BILITOT 2.0* 2.0* 0.9 0.6 0.6  PROT 8.1 5.8* 5.4* 5.9* 5.7*  ALBUMIN 4.7 2.5* 2.0* 2.2* 2.2*   Recent Labs  Lab 09/27/24 1106  LIPASE 26   No results for input(s): AMMONIA in the last 168 hours.  ABG    Component Value Date/Time   PHART 7.474 (H) 10/01/2024 0417   PCO2ART 38.3 10/01/2024 0417   PO2ART 84 10/01/2024 0417   HCO3 27.9  10/01/2024 0417   TCO2 29 10/01/2024 0417   ACIDBASEDEF 4.0 (H) 09/29/2024 0306   O2SAT 96 10/01/2024 0417     Coagulation Profile: No results for input(s): INR, PROTIME in the last 168 hours.  Cardiac Enzymes: No results for input(s): CKTOTAL, CKMB, CKMBINDEX, TROPONINI in the last 168 hours.  HbA1C: Hgb A1c MFr Bld  Date/Time Value Ref Range Status  09/16/2017 09:58 AM 5.5 4.8 - 5.6 % Final    Comment:             Prediabetes: 5.7 - 6.4          Diabetes: >6.4          Glycemic control for adults with diabetes: <7.0   05/20/2015 10:10 AM 5.8 (H) <5.7 % Final    Comment:                                                                           According to the ADA Clinical Practice Recommendations for 2011, when HbA1c is used as a screening test:     >=6.5%   Diagnostic of Diabetes Mellitus            (if abnormal result is confirmed)   5.7-6.4%  Increased risk of developing Diabetes Mellitus   References:Diagnosis and Classification of Diabetes Mellitus,Diabetes Care,2011,34(Suppl 1):S62-S69 and Standards of Medical Care in         Diabetes - 2011,Diabetes Care,2011,34 (Suppl 1):S11-S61.       CBG: Recent Labs  Lab 10/02/24 2315 10/03/24 0329 10/03/24 0832 10/03/24 1134 10/03/24 1603  GLUCAP 182* 156* 151* 88 225*   The patient is critically ill due to respiratory failure.  Critical care was necessary to treat or prevent imminent or life-threatening deterioration.  Critical care was time spent personally by me on the following activities: development of treatment plan with patient and/or surrogate as well as nursing, discussions with consultants, evaluation of patient's response to treatment, examination of patient, obtaining history from patient or surrogate, ordering and performing treatments and interventions, ordering and review of laboratory studies, ordering and review of radiographic studies, pulse oximetry, re-evaluation of patient's condition  and participation in multidisciplinary rounds.   Critical care was time spent personally by me on the following activities: high risk medication and infusion titration and management and weaning of mechanical ventilation , development of treatment plan with patient and/or surrogate as well as nursing, discussions with consultants, evaluation of patient's response to treatment, examination of patient, obtaining history from patient or surrogate, ordering and performing treatments and interventions, ordering and review of laboratory studies, ordering and review of radiographic studies, pulse oximetry, re-evaluation of patient's condition and participation in multidisciplinary rounds.  Critical Care Time devoted to patient care services described in this note is 36 minutes. This time reflects time of care of this signee Anne Sebring S Layal Javid . This critical care time does not reflect separately billable procedures or procedure time, teaching time or supervisory time of PA/NP/Med student/Med Resident etc but could involve care discussion time.       Verdon GORMAN [facility] 10/03/2024 5:52 PM  Pager: see AMION  If no response to pager , please call critical care on call (see AMION) until 7pm After 7:00 pm call Elink

## 2024-10-04 ENCOUNTER — Inpatient Hospital Stay (HOSPITAL_COMMUNITY)

## 2024-10-04 DIAGNOSIS — U071 COVID-19: Secondary | ICD-10-CM

## 2024-10-04 LAB — BASIC METABOLIC PANEL WITH GFR
Anion gap: 10 (ref 5–15)
BUN: 53 mg/dL — ABNORMAL HIGH (ref 8–23)
CO2: 30 mmol/L (ref 22–32)
Calcium: 8.5 mg/dL — ABNORMAL LOW (ref 8.9–10.3)
Chloride: 106 mmol/L (ref 98–111)
Creatinine, Ser: 1.27 mg/dL — ABNORMAL HIGH (ref 0.61–1.24)
GFR, Estimated: >60 mL/min (ref >60)
Glucose, Bld: 162 mg/dL — ABNORMAL HIGH (ref 70–99)
Potassium: 4.3 mmol/L (ref 3.5–5.1)
Sodium: 146 mmol/L — ABNORMAL HIGH (ref 135–145)

## 2024-10-04 LAB — GLUCOSE, CAPILLARY
Glucose-Capillary: 123 mg/dL — ABNORMAL HIGH (ref 70–99)
Glucose-Capillary: 132 mg/dL — ABNORMAL HIGH (ref 70–99)
Glucose-Capillary: 134 mg/dL — ABNORMAL HIGH (ref 70–99)
Glucose-Capillary: 153 mg/dL — ABNORMAL HIGH (ref 70–99)
Glucose-Capillary: 157 mg/dL — ABNORMAL HIGH (ref 70–99)
Glucose-Capillary: 172 mg/dL — ABNORMAL HIGH (ref 70–99)

## 2024-10-04 LAB — CBC
HCT: 36.4 % — ABNORMAL LOW (ref 39.0–52.0)
Hemoglobin: 11.7 g/dL — ABNORMAL LOW (ref 13.0–17.0)
MCH: 27.7 pg (ref 26.0–34.0)
MCHC: 32.1 g/dL (ref 30.0–36.0)
MCV: 86.1 fL (ref 80.0–100.0)
Platelets: 197 K/uL (ref 150–400)
RBC: 4.23 MIL/uL (ref 4.22–5.81)
RDW: 14 % (ref 11.5–15.5)
WBC: 22.4 K/uL — ABNORMAL HIGH (ref 4.0–10.5)
nRBC: 0.3 % — ABNORMAL HIGH (ref 0.0–0.2)

## 2024-10-04 LAB — PROCALCITONIN: Procalcitonin: 12.58 ng/mL

## 2024-10-04 MED ORDER — HYDROXYZINE HCL 10 MG PO TABS
10.0000 mg | ORAL_TABLET | Freq: Three times a day (TID) | ORAL | Status: DC | PRN
  Administered 2024-10-08 – 2024-10-09 (×2): 10 mg
  Filled 2024-10-04 (×3): qty 1

## 2024-10-04 MED ORDER — ACETAMINOPHEN 325 MG PO TABS
650.0000 mg | ORAL_TABLET | Freq: Four times a day (QID) | ORAL | Status: DC | PRN
  Administered 2024-10-04 – 2024-10-10 (×8): 650 mg
  Filled 2024-10-04 (×8): qty 2

## 2024-10-04 MED ORDER — ORAL CARE MOUTH RINSE
15.0000 mL | Freq: Three times a day (TID) | OROMUCOSAL | Status: DC
  Administered 2024-10-04 – 2024-10-12 (×23): 15 mL via OROMUCOSAL

## 2024-10-04 MED ORDER — BISACODYL 10 MG RE SUPP
10.0000 mg | Freq: Once | RECTAL | Status: AC
  Administered 2024-10-04: 10 mg via RECTAL
  Filled 2024-10-04: qty 1

## 2024-10-04 NOTE — Plan of Care (Signed)
  Problem: Clinical Measurements: Goal: Diagnostic test results will improve Outcome: Progressing Goal: Respiratory complications will improve Outcome: Progressing Goal: Cardiovascular complication will be avoided Outcome: Progressing   

## 2024-10-04 NOTE — Progress Notes (Signed)
 NAME:  Bryce Martinez, MRN:  [id number], DOB:  Jun 14, 1951, LOS: 7 ADMISSION DATE:  09/27/2024, CONSULTATION DATE:  11/24 REFERRING MD:  EDP CHIEF COMPLAINT:  ARDS   History of Present Illness:  Bryce Martinez is a 73 year old male with a pertinent past medical history of hypertension, hyperlipidemia, arthritis, GERD, and history of  hepatitis C s/p treatment and sustained virologic response with advanced fibrosis who presented to [facility] this morning for a routine screening colonoscopy. During the procedure, it was noted by anesthesia and gastroenterology that the patient had a clear/slightly bilious emesis during the procedure. He has had prior colonoscopies without complication. He was monitored after the procedure to be saturating 93% on room air.  Upon discharge, patient denied pain, SOB, or cough. Shortly after returning home, the patient noticed pain to the middle of his back and abdomen, felt SOB, and could not breathe deeply. Took 2 Pepto Bismol without relief. Pt's wife was advised to take him to [facility] ED, where he was placed on NRB at 15L and SpO2 remained at 92%.  CXR was done and showed pneumonia in the left lung and possible pleural effusion. Patient then placed on BiPAP and HFNC, however remained dyspneic and tachypneic. Denies further N/V since procedure. Patient was ultimately intubated for airway.   PCCM was consulted for transfer to [facility] and admission for further management. Pertinent  Medical History   Past Medical History:  Diagnosis Date   Allergy    Arthritis    ED (erectile dysfunction)    GERD (gastroesophageal reflux disease)    Occasionally   Hepatitis C    Hyperlipidemia    Hypertension    Significant Hospital Events: Including procedures, antibiotic start and stop dates in addition to other pertinent events   11/24 -Colonoscopy at [facility] with Dr. Eartha Flavors, postop O2 requirements showing 93% on RA at discharge. 11/24- Trialed 15L  NRB, BiPAP without respiratory improvement. PCCM consulted for admission and worsening respiratory status,  intubated in [facility] ED.  11/24- Left radial arterial line placed  11/24- 2030, CVC was placed for increased Levophed  requirements 11/28- Failed SBT, will switch to Precedex for sedation to improve respiratory drive  Interim History / Subjective:  Temperature to 101 overnight, otherwise no other events.   Objective   Blood pressure 110/70, pulse 70, temperature (!) 102.2 F (39 C), temperature source Oral, resp. rate (!) 24, height 6' 1 (1.854 m), weight 75.7 kg, SpO2 99%. CVP:  [0 mmHg-63 mmHg] 7 mmHg  Vent Mode: PSV;CPAP FiO2 (%):  [40 %] 40 % Set Rate:  [14 bmp] 14 bmp Vt Set:  [500 mL] 500 mL PEEP:  [5 cmH20] 5 cmH20 Pressure Support:  [8 cmH20] 8 cmH20 Plateau Pressure:  [18 cmH20-20 cmH20] 20 cmH20   Intake/Output Summary (Last 24 hours) at 10/04/2024 1102 Last data filed at 10/04/2024 1000 Gross per 24 hour  Intake 1554.08 ml  Output 3505 ml  Net -1950.92 ml   Filed Weights   10/02/24 0500 10/03/24 0500 10/04/24 0500  Weight: 80.6 kg 77.2 kg 75.7 kg   Examination: General: tired-appearing male, diaphoretic, NAD HENT: [place], PEERLA, ETT in place, OG tube in place in right nare Lungs: MV, clear to auscultation bilaterally, no wheezing. Cardiovascular: RRR, no murmurs/rubs/gallops, no JVD Abdomen: soft, positive bowel sounds, no tenderness, no distension, no masses Extremities: Warm, no edema. DP/PT pulses bilaterally diminished. Radial pulses 1+. Neuro: intubated, sedated on Precedex. Responding to voice, touch, and stimuli. Following commands, moving all  4 extremities.  GU: Foley placed with yellow urine output  Pertinent labs, imaging, and tests in the past 24 hours: Na:  146 K: 143 BUN/Cr:  53/1.27 Cl:  106 Plts: 197 BG: 172 H/H: 11.7/36.4 WBC:  22.4  Resolved problem list  AGMA Shock Assessment and Plan   Respiratory Acute Hypoxic Respiratory  Failure 2/2 Severe Acute Respiratory Distress Syndrome from Acute Aspiration Pneumonia P: - PaO2/FiO2 ratio improving to 210.0 mmHg with PEEP = 5, indicating improving mild ARDS. - Vent settings overnight FIO2 40%, PEEP 5, RR 14, TV 500. Maintain lung protection ventilation of 6cc/kg of ideal body weight to prevent volutrauma or barotrauma.  -On Precedex 0.8mcg, will wean as tolerated. Will switch agents for anxiety if worsening post extubation - Failed SBT/SAT for tachypnea yesterday, will attempt today - Due to overnight fevers, will recheck CXR and procal to rule out any changes in pulmonary status - Continue stress dose steroids with 5 days of Decadron  10mg  (D1= 11/30-12/4) to cover for ARDS and septic shock. Will discontinue if O2 requirements are low after extubation.  - VAP protocol - Completed 5 days of IV antibiotics with Zosyn  and CTX. - Bcx collected showing NGTD   Infection History of Hepatitis C with Advanced Fibrosis P: - s/p treatment  - Currently fibrotic per elastography 02/2023. RUQ US  on 03/2024 WNL. - Continue to follow outpatient with GI, already established.  Cardiac Acute HFreF EF 25-30%, suspect septic cardiomyopathy Shock, resolved P: - Maintaining MAPs 70-80s off of pressors  - ~2.2L diuresed since 11/30. - Recommend repeating echo in outpatient setting to monitor for improvement in 2 months.  History of Hypertension P: - Hold home Losartan  100mg  and Spironolactone  25mg  daily while intubated  Gastrointestinal: Abdominal Distension, acute P: - Daily BM - Continue Senna, colace for bowel regimen - Will add simethicone  for gas  Renal AKI, improving with diuresis P: - Cr today 1.27, up from 1.14. Will continue to monitor daily.   Endocrine: Type 2 Diabetes Mellitus P: -SSI  -Short-acting insulin  q4hr -Semglee 8u daily  -CBG q 4hr  Nutrition: P: - OG tube, no secretions or overnight emesis - CoreTrak for tube  feeds  Tubes/Lines/Therapy Tubes: ETT, OG tube, foley catheter Lines: Right Triple-Lumen internal jugular CVC, Left radial arterial line, 3 PIVs (2 right, 1 left) DVT Ppx: [place] Heparin  GI Ppx: Pantroprazole 40mg  daily  Labs   CBC: Recent Labs  Lab 09/27/24 1106 09/28/24 0238 09/30/24 0536 10/01/24 0417 10/01/24 0801 10/02/24 0500 10/03/24 0539 10/04/24 0500  WBC 4.9   < > 22.7*  --  32.1* 29.0* 26.3* 22.4*  NEUTROABS 3.7  --   --   --   --   --   --   --   HGB 15.3   < > 11.5* 10.5* 10.5* 11.2* 11.3* 11.7*  HCT 48.0   < > 32.5* 31.0* 30.1* 33.1* 34.2* 36.4*  MCV 87.6   < > 80.0  --  81.1 83.8 84.7 86.1  PLT 275   < > 111*  --  111* 115* 137* 197   < > = values in this interval not displayed.    Basic Metabolic Panel: Recent Labs  Lab 09/29/24 2035 09/30/24 0508 09/30/24 0536 10/01/24 0415 10/01/24 0417 10/01/24 2236 10/02/24 0500 10/03/24 0539 10/04/24 0500  NA  --    < > 134* 138 138  --  142 141 146*  K 3.6   < > 3.7 3.8 3.9  --  3.9 4.2 4.3  CL  --   --  99 102  --   --  105 103 106  CO2  --   --  23 25  --   --  27 29 30   GLUCOSE  --   --  160* 154*  --   --  196* 168* 162*  BUN  --   --  27* 38*  --   --  50* 52* 53*  CREATININE  --   --  1.17 1.24  --   --  1.23 1.14 1.27*  CALCIUM   --   --  7.9* 8.4*  --   --  8.2* 8.2* 8.5*  MG 1.6*  --  2.7* 2.7*  --   --  2.3 2.4  --   PHOS 2.1*  --  2.2* 1.2*  --  2.4* 3.3 2.7  --    < > = values in this interval not displayed.   GFR: Estimated Creatinine Clearance: 56.3 mL/min (A) (by C-G formula based on SCr of 1.27 mg/dL (H)). Recent Labs  Lab 09/29/24 0807 09/30/24 0536 10/01/24 0415 10/01/24 0801 10/02/24 0500 10/03/24 0539 10/04/24 0500  WBC  --  22.7*  --  32.1* 29.0* 26.3* 22.4*  LATICACIDVEN 4.8* 3.2* 2.2*  --  1.8  --   --     Liver Function Tests: Recent Labs  Lab 09/27/24 1106 09/29/24 0452 10/01/24 0415 10/02/24 0500 10/03/24 0539  AST 31 46* 35 39 34  ALT 37 25 26 31 29   ALKPHOS 61  40 76 88 92  BILITOT 2.0* 2.0* 0.9 0.6 0.6  PROT 8.1 5.8* 5.4* 5.9* 5.7*  ALBUMIN 4.7 2.5* 2.0* 2.2* 2.2*   Recent Labs  Lab 09/27/24 1106  LIPASE 26   No results for input(s): AMMONIA in the last 168 hours.  ABG    Component Value Date/Time   PHART 7.474 (H) 10/01/2024 0417   PCO2ART 38.3 10/01/2024 0417   PO2ART 84 10/01/2024 0417   HCO3 27.9 10/01/2024 0417   TCO2 29 10/01/2024 0417   ACIDBASEDEF 4.0 (H) 09/29/2024 0306   O2SAT 96 10/01/2024 0417     Coagulation Profile: No results for input(s): INR, PROTIME in the last 168 hours.  Cardiac Enzymes: No results for input(s): CKTOTAL, CKMB, CKMBINDEX, TROPONINI in the last 168 hours.  HbA1C: Hgb A1c MFr Bld  Date/Time Value Ref Range Status  09/16/2017 09:58 AM 5.5 4.8 - 5.6 % Final    Comment:             Prediabetes: 5.7 - 6.4          Diabetes: >6.4          Glycemic control for adults with diabetes: <7.0   05/20/2015 10:10 AM 5.8 (H) <5.7 % Final    Comment:                                                                           According to the ADA Clinical Practice Recommendations for 2011, when HbA1c is used as a screening test:     >=6.5%   Diagnostic of Diabetes Mellitus            (if abnormal result is confirmed)   5.7-6.4%  Increased risk of developing Diabetes Mellitus   References:Diagnosis and Classification of Diabetes Mellitus,Diabetes Care,2011,34(Suppl 1):S62-S69 and Standards of Medical Care in         Diabetes - 2011,Diabetes Care,2011,34 (Suppl 1):S11-S61.       CBG: Recent Labs  Lab 10/03/24 1603 10/03/24 1930 10/03/24 2319 10/04/24 0317 10/04/24 0733  GLUCAP 225* 208* 206* 172* 157*    Past Medical History:  He,  has a past medical history of Allergy, Arthritis, ED (erectile dysfunction), GERD (gastroesophageal reflux disease), Hepatitis C, Hyperlipidemia, and Hypertension.   Surgical History:   Past Surgical History:  Procedure Laterality Date    APPENDECTOMY     CHOLECYSTECTOMY N/A 04/25/2014   Procedure: LAPAROSCOPIC CHOLECYSTECTOMY;  Surgeon: Oneil DELENA Budge, MD;  Location: [facility];  Service: General;  Laterality: N/A;   COLONOSCOPY N/A 09/01/2019   Procedure: COLONOSCOPY;  Surgeon: Golda Claudis PENNER, MD;  Location: [facility];  Service: Endoscopy;  Laterality: N/A;  930   COLONOSCOPY N/A 09/27/2024   Procedure: COLONOSCOPY;  Surgeon: Eartha Angelia Sieving, MD;  Location: [facility];  Service: Gastroenterology;  Laterality: N/A;  7:30am, ASA 1-2   HEMICOLECTOMY Right    lap hand assisted   POLYPECTOMY  09/01/2019   Procedure: POLYPECTOMY;  Surgeon: Golda Claudis PENNER, MD;  Location: [facility];  Service: Endoscopy;;  colon   right shoulder surgery Right 03/2005   rotator cuff repair     Social History:   reports that he quit smoking about 52 years ago. His smoking use included cigarettes. He started smoking about 67 years ago. He has a 4.4 pack-year smoking history. He has been exposed to tobacco smoke. He has never used smokeless tobacco. He reports that he does not currently use alcohol. He reports that he does not use drugs.   Family History:  His family history includes Cancer in his father; Diabetes in his brother; Drug abuse in his brother; Hypertension in his brother, daughter, mother, sister, and son.   Allergies Allergies  Allergen Reactions   Amlodipine  Other (See Comments)    unknown     Home Medications  Prior to Admission medications   Medication Sig Start Date End Date Taking? Authorizing Provider  losartan  (COZAAR ) 100 MG tablet TAKE 1 TABLET BY MOUTH EVERY DAY 03/24/24   Antonetta Rollene BRAVO, MD  meclizine  (ANTIVERT ) 25 MG tablet Take 1 tablet (25 mg total) by mouth 2 (two) times daily as needed for dizziness. 08/24/24   Tobie Suzzane POUR, MD  Misc. Devices MISC Blood pressure cuff/device - 1. ICD10: I10 08/24/24   Tobie Suzzane POUR, MD  spironolactone  (ALDACTONE ) 25 MG tablet TAKE 1 TABLET (25 MG TOTAL)  BY MOUTH DAILY. 06/24/24   Antonetta Rollene BRAVO, MD     Critical care time: 44    Jameika Kinn, DO Internal Medicine Resident, PGY-1 Please contact the on call pager at [phone] for any urgent or emergent needs. 11:02 AM 10/04/2024

## 2024-10-04 NOTE — Evaluation (Signed)
 Clinical/Bedside Swallow Evaluation Patient Details  Name: Bryce Martinez MRN: [id number] Date of Birth: 08/22/1951  Today's Date: 10/04/2024 Time: SLP Start Time (ACUTE ONLY): 1525 SLP Stop Time (ACUTE ONLY): 1535 SLP Time Calculation (min) (ACUTE ONLY): 10 min  Past Medical History:  Past Medical History:  Diagnosis Date   Allergy    Arthritis    ED (erectile dysfunction)    GERD (gastroesophageal reflux disease)    Occasionally   Hepatitis C    Hyperlipidemia    Hypertension    Past Surgical History:  Past Surgical History:  Procedure Laterality Date   APPENDECTOMY     CHOLECYSTECTOMY N/A 04/25/2014   Procedure: LAPAROSCOPIC CHOLECYSTECTOMY;  Surgeon: Oneil DELENA Budge, MD;  Location: [facility];  Service: General;  Laterality: N/A;   COLONOSCOPY N/A 09/01/2019   Procedure: COLONOSCOPY;  Surgeon: Golda Claudis PENNER, MD;  Location: [facility];  Service: Endoscopy;  Laterality: N/A;  930   COLONOSCOPY N/A 09/27/2024   Procedure: COLONOSCOPY;  Surgeon: Eartha Angelia Sieving, MD;  Location: [facility];  Service: Gastroenterology;  Laterality: N/A;  7:30am, ASA 1-2   HEMICOLECTOMY Right    lap hand assisted   POLYPECTOMY  09/01/2019   Procedure: POLYPECTOMY;  Surgeon: Golda Claudis PENNER, MD;  [facility]: [facility];  Service: Endoscopy;;  colon   right shoulder surgery Right 03/2005   rotator cuff repair   HPI:  Pt is a 73 year old male admitted with ARDS due to aspiration pna after witnessed emesis during a routine colonoscopy. CXR was done and showed pneumonia in the left lung and possible pleural effusion. Patient then placed on BiPAP and HFNC, however remained dyspneic and tachypneic. Pt intubated 11/24-12/1. No prior history of dysphagia or esophageal dysphagia in chart.    Assessment / Plan / Recommendation  Clinical Impression  Pt exhibits altered mental status; he is awake and able to follow commands, but responses are delayed and slow. Pt able to masticate and  swallow ice chip if given one step verbal instruction to do so, otherwise orally holding. Pt is not ready for 3 oz water  swallow to fully assess airway protection. Pt to remain NPO, SLP can reassess tomorrow. If arousal and attention improve overnight pt can be given ice chips per RN discretion. SLP Visit Diagnosis: Dysphagia, unspecified (R13.10)    Aspiration Risk  Moderate aspiration risk    Diet Recommendation Ice chips PRN after oral care         Other  Recommendations Oral Care Recommendations: Oral care QID     Assistance Recommended at Discharge    Functional Status Assessment    Frequency and Duration min 2x/week  2 weeks       Prognosis Prognosis for improved oropharyngeal function: Good      Swallow Study   General HPI: Pt is a 73 year old male admitted with ARDS due to aspiration pna after witnessed emesis during a routine colonoscopy. CXR was done and showed pneumonia in the left lung and possible pleural effusion. Patient then placed on BiPAP and HFNC, however remained dyspneic and tachypneic. Pt intubated 11/24-12/1. No prior history of dysphagia or esophageal dysphagia in chart. Type of Study: Bedside Swallow Evaluation Previous Swallow Assessment: none Diet Prior to this Study: NPO Temperature Spikes Noted: No Respiratory Status: Nasal cannula History of Recent Intubation: Yes Total duration of intubation (days): 8 days Date extubated: 10/04/24 Behavior/Cognition: Requires cueing;Lethargic/Drowsy Oral Cavity Assessment: Within Functional Limits Oral Care Completed by SLP: No Oral Cavity -  Dentition: Adequate natural dentition Self-Feeding Abilities: Total assist Patient Positioning: Upright in bed Baseline Vocal Quality: Hoarse Volitional Cough: Weak Volitional Swallow: Able to elicit    Oral/Motor/Sensory Function Overall Oral Motor/Sensory Function: Generalized oral weakness   Ice Chips Ice chips: Impaired Presentation: Spoon Oral Phase Functional  Implications: Oral holding   Thin Liquid Thin Liquid: Not tested    Nectar Thick Nectar Thick Liquid: Not tested   Honey Thick Honey Thick Liquid: Not tested   Puree Puree: Not tested   Solid            Amyia Lodwick, Consuelo Fitch 10/04/2024,3:39 PM

## 2024-10-04 NOTE — Procedures (Signed)
 Extubation Procedure Note  Patient Details:   Name: Bryce Martinez DOB: 02/27/1951 MRN: [id number]   Airway Documentation:    Vent end date: 10/04/24 Vent end time: 1315   Evaluation  O2 sats: stable throughout Complications: No apparent complications Patient did tolerate procedure well. Bilateral Breath Sounds: Diminished   Yes,  Per CCM order, RT extubated pt to [place]. Prior to extubation, pt did have a audible cuff leak. Pt tolerated well with SVS, no stridor noted and pt was able to speak.  Ronte Parker R 10/04/2024, 1:17 PM

## 2024-10-05 LAB — MAGNESIUM: Magnesium: 2.7 mg/dL — ABNORMAL HIGH (ref 1.7–2.4)

## 2024-10-05 LAB — BASIC METABOLIC PANEL WITH GFR
Anion gap: 9 (ref 5–15)
BUN: 53 mg/dL — ABNORMAL HIGH (ref 8–23)
CO2: 29 mmol/L (ref 22–32)
Calcium: 8.7 mg/dL — ABNORMAL LOW (ref 8.9–10.3)
Chloride: 109 mmol/L (ref 98–111)
Creatinine, Ser: 1.05 mg/dL (ref 0.61–1.24)
GFR, Estimated: >60 mL/min (ref >60)
Glucose, Bld: 143 mg/dL — ABNORMAL HIGH (ref 70–99)
Potassium: 4 mmol/L (ref 3.5–5.1)
Sodium: 147 mmol/L — ABNORMAL HIGH (ref 135–145)

## 2024-10-05 LAB — GLUCOSE, CAPILLARY
Glucose-Capillary: 128 mg/dL — ABNORMAL HIGH (ref 70–99)
Glucose-Capillary: 150 mg/dL — ABNORMAL HIGH (ref 70–99)
Glucose-Capillary: 136 mg/dL — ABNORMAL HIGH (ref 70–99)
Glucose-Capillary: 168 mg/dL — ABNORMAL HIGH (ref 70–99)
Glucose-Capillary: 201 mg/dL — ABNORMAL HIGH (ref 70–99)
Glucose-Capillary: 151 mg/dL — ABNORMAL HIGH (ref 70–99)

## 2024-10-05 LAB — PHOSPHORUS: Phosphorus: 4.6 mg/dL (ref 2.5–4.6)

## 2024-10-05 LAB — CBC
HCT: 37.6 % — ABNORMAL LOW (ref 39.0–52.0)
Hemoglobin: 12.2 g/dL — ABNORMAL LOW (ref 13.0–17.0)
MCH: 28 pg (ref 26.0–34.0)
MCHC: 32.4 g/dL (ref 30.0–36.0)
MCV: 86.2 fL (ref 80.0–100.0)
Platelets: 251 K/uL (ref 150–400)
RBC: 4.36 MIL/uL (ref 4.22–5.81)
RDW: 14.2 % (ref 11.5–15.5)
WBC: 17.1 K/uL — ABNORMAL HIGH (ref 4.0–10.5)
nRBC: 0.1 % (ref 0.0–0.2)

## 2024-10-05 MED ORDER — OSMOLITE 1.5 CAL PO LIQD
1000.0000 mL | ORAL | Status: DC
  Administered 2024-10-05 – 2024-10-08 (×4): 1000 mL
  Filled 2024-10-05 (×7): qty 1000

## 2024-10-05 MED ORDER — LIDOCAINE 5 % EX PTCH
1.0000 | MEDICATED_PATCH | Freq: Every day | CUTANEOUS | Status: DC
  Administered 2024-10-05 – 2024-10-12 (×8): 1 via TRANSDERMAL
  Filled 2024-10-05 (×8): qty 1

## 2024-10-05 MED ORDER — LOSARTAN POTASSIUM 50 MG PO TABS
100.0000 mg | ORAL_TABLET | Freq: Every day | ORAL | Status: DC
  Administered 2024-10-05 – 2024-10-10 (×6): 100 mg
  Filled 2024-10-05 (×6): qty 2

## 2024-10-05 NOTE — Plan of Care (Signed)
  Problem: Education: Goal: Knowledge of General Education information will improve Description: Including pain rating scale, medication(s)/side effects and non-pharmacologic comfort measures Outcome: Progressing   Problem: Health Behavior/Discharge Planning: Goal: Ability to manage health-related needs will improve Outcome: Progressing   Problem: Clinical Measurements: Goal: Ability to maintain clinical measurements within normal limits will improve Outcome: Progressing Goal: Will remain free from infection Outcome: Progressing Goal: Diagnostic test results will improve Outcome: Progressing Goal: Respiratory complications will improve Outcome: Progressing Goal: Cardiovascular complication will be avoided Outcome: Progressing   Problem: Activity: Goal: Risk for activity intolerance will decrease Outcome: Progressing   Problem: Nutrition: Goal: Adequate nutrition will be maintained Outcome: Progressing   Problem: Coping: Goal: Level of anxiety will decrease Outcome: Progressing   Problem: Elimination: Goal: Will not experience complications related to bowel motility Outcome: Progressing Goal: Will not experience complications related to urinary retention Outcome: Progressing   Problem: Pain Managment: Goal: General experience of comfort will improve and/or be controlled Outcome: Progressing   Problem: Safety: Goal: Ability to remain free from injury will improve Outcome: Progressing   Problem: Skin Integrity: Goal: Risk for impaired skin integrity will decrease Outcome: Progressing   Problem: Education: Goal: Knowledge of risk factors and measures for prevention of condition will improve Outcome: Progressing   Problem: Coping: Goal: Psychosocial and spiritual needs will be supported Outcome: Progressing   Problem: Respiratory: Goal: Will maintain a patent airway Outcome: Progressing Goal: Complications related to the disease process, condition or treatment  will be avoided or minimized Outcome: Progressing   Problem: Activity: Goal: Ability to tolerate increased activity will improve Outcome: Progressing   Problem: Respiratory: Goal: Ability to maintain a clear airway and adequate ventilation will improve Outcome: Progressing   Problem: Role Relationship: Goal: Method of communication will improve Outcome: Progressing   Problem: Coping: Goal: Ability to adjust to condition or change in health will improve Outcome: Progressing   Problem: Fluid Volume: Goal: Ability to maintain a balanced intake and output will improve Outcome: Progressing   Problem: Health Behavior/Discharge Planning: Goal: Ability to identify and utilize available resources and services will improve Outcome: Progressing Goal: Ability to manage health-related needs will improve Outcome: Progressing   Problem: Metabolic: Goal: Ability to maintain appropriate glucose levels will improve Outcome: Progressing   Problem: Nutritional: Goal: Maintenance of adequate nutrition will improve Outcome: Progressing Goal: Progress toward achieving an optimal weight will improve Outcome: Progressing   Problem: Skin Integrity: Goal: Risk for impaired skin integrity will decrease Outcome: Progressing   Problem: Tissue Perfusion: Goal: Adequacy of tissue perfusion will improve Outcome: Progressing    Problem: Safety: Goal: Non-violent Restraint(s) Outcome: Completed/Met

## 2024-10-05 NOTE — Progress Notes (Signed)
 Nutrition Follow-up  DOCUMENTATION CODES:  Not applicable  INTERVENTION:  Continuing the following via cortrak: Osmolite 1.5 at 65 ml/h (1560 ml per day) Prosource TF20 60 ml 1x/d Provides 2420 kcal, 118 gm protein, 1189 ml free water  daily Continue current diet as ordered, encourage PO intake  NUTRITION DIAGNOSIS:  Increased nutrient needs related to acute illness as evidenced by estimated needs. - remains applicable  GOAL:  Patient will meet greater than or equal to 90% of their needs - progressing  MONITOR:  TF tolerance, Vent status, I & O's, Labs  REASON FOR ASSESSMENT:  Consult Enteral/tube feeding initiation and management  ASSESSMENT:  Pt with hx of of HTN, HLD, GERD and hx of hepatitis C presented to ED with SOB after vomiting after an outpatient procedure earlier in the day. Workup consistent with ARDS, intubated in ED.  11/24 - Presented to [facility], intubated, transferred to [facility] ICU  11/26 - trickles initiated  11/28 - cortrak placed 12/1 - extubated 12/2 - SLP BSE, DYS3, thin  Pt resting in bed at the time of assessment. Discussed with RN states that pt is awake today but very weak. SLP evaluated and able to place a diet order but did note that pt only ate a small amount before tiring out. Would benefit from keeping cortrak tube until pt's stamina is more at baseline. Adjusting TF regimen slightly to provide more kcal since no longer intubated and no longer receiving propofol .   Discussed in rounds. Stable to move out of ICU today.  Admit weight: 81.2 kg   Current weight: 75.7 kg    Intake/Output Summary (Last 24 hours) at 10/05/2024 0854 Last data filed at 10/05/2024 0700 Gross per 24 hour  Intake 801.49 ml  Output 1525 ml  Net -723.51 ml  Net IO Since Admission: 404.64 mL [10/05/24 0854]  Drains/Lines: Cortrak (gastric) UOP x 24 hours  Nutritionally Relevant Medications: Scheduled Meds:  dexamethasone     10 mg Intravenous Q24H    docusate  100 mg Per Tube BID   PROSource TF20  60 mL Per Tube Daily   insulin  aspart  0-20 Units Subcutaneous Q4H   insulin  glargine-yfgn  8 Units Subcutaneous Daily   pantoprazole  IV  40 mg Intravenous Q24H   senna  1 tablet Per Tube QHS   thiamine  100 mg Per Tube Daily   Continuous Infusions:  feeding supplement (VITAL 1.5 CAL) 40 mL/hr at 10/05/24 0700   Labs Reviewed: Sodium 147 BUN 53 Magnesium  2.7 CBG ranges from 123-172 mg/dL over the last 24 hours  NUTRITION - FOCUSED PHYSICAL EXAM: Deficits noted around the face, appear to be pt's normal bone structure based on wife's report and pt picture in EMR Flowsheet Row Most Recent Value  Orbital Region Mild depletion  Upper Arm Region No depletion  Thoracic and Lumbar Region No depletion  Buccal Region No depletion  Temple Region Mild depletion  Clavicle Bone Region No depletion  Clavicle and Acromion Bone Region Mild depletion  Scapular Bone Region No depletion  Dorsal Hand No depletion  Patellar Region No depletion  Anterior Thigh Region No depletion  Posterior Calf Region No depletion  Edema (RD Assessment) None  Hair Reviewed  Eyes Reviewed  Mouth Reviewed  Skin Reviewed  Nails Reviewed    Diet Order:   Diet Order             Diet NPO time specified  Diet effective now  EDUCATION NEEDS:  Education needs have been addressed  Skin:  Skin Assessment: Reviewed RN Assessment  Last BM:  12/2 - type 7  Height:  Ht Readings from Last 1 Encounters:  09/27/24 6' 1 (1.854 m)    Weight:  Wt Readings from Last 1 Encounters:  10/04/24 75.7 kg    Ideal Body Weight:  83.6 kg  BMI:  Body mass index is 22.02 kg/m.  Estimated Nutritional Needs:  Kcal:  2200-2500 kcal/d Protein:  110-125g/d Fluid:  2.5L/d    Vernell Lukes, RD, LDN, CNSC Registered Dietitian II Please reach out via secure chat

## 2024-10-05 NOTE — Plan of Care (Signed)
  Problem: Education: Goal: Knowledge of General Education information will improve Description: Including pain rating scale, medication(s)/side effects and non-pharmacologic comfort measures Outcome: Progressing   Problem: Clinical Measurements: Goal: Diagnostic test results will improve Outcome: Progressing Goal: Respiratory complications will improve Outcome: Progressing Goal: Cardiovascular complication will be avoided Outcome: Progressing

## 2024-10-05 NOTE — Progress Notes (Addendum)
 NAME:  Bryce Martinez, MRN:  [id number], DOB:  1950/12/21, LOS: 8 ADMISSION DATE:  09/27/2024, CONSULTATION DATE:  11/24 REFERRING MD:  EDP CHIEF COMPLAINT:  ARDS   History of Present Illness:  Bryce Martinez is a 73 year old male with a pertinent past medical history of hypertension, hyperlipidemia, arthritis, GERD, and history of  hepatitis C s/p treatment and sustained virologic response with advanced fibrosis who presented to [facility] 11/24 for a routine screening colonoscopy. During the procedure, it was noted by anesthesia and gastroenterology that the patient had a clear/slightly bilious emesis during the procedure. He has had prior colonoscopies without complication. He was monitored after the procedure to be saturating 93% on room air.  Upon discharge, patient denied pain, SOB, or cough. Shortly after returning home, the patient noticed pain to the middle of his back and abdomen, felt SOB, and could not breathe deeply. Pt's wife was advised to take him to [facility] ED, where he was placed on NRB at 15L and SpO2 remained at 92%.  CXR was done and showed pneumonia in the left lung and possible pleural effusion. Patient then placed on BiPAP and HFNC, however remained dyspneic and tachypneic. Denies further N/V since procedure. Patient was ultimately intubated for airway.   PCCM was consulted for transfer to [facility] and admission for further management of suspected ARDS. Pertinent  Medical History   Past Medical History:  Diagnosis Date   Allergy    Arthritis    ED (erectile dysfunction)    GERD (gastroesophageal reflux disease)    Occasionally   Hepatitis C    Hyperlipidemia    Hypertension    Significant Hospital Events: Including procedures, antibiotic start and stop dates in addition to other pertinent events   11/24 -Colonoscopy at [facility] with Dr. Eartha Flavors, postop O2 requirements showing 93% on RA at discharge. 11/24- Trialed 15L NRB, BiPAP without  respiratory improvement. PCCM consulted for admission and worsening respiratory status,  intubated in [facility] ED.  11/24- Left radial arterial line placed  11/24- 2030, CVC was placed for increased Levophed  requirements 11/28- Failed SBT, will switch to Precedex for sedation to improve respiratory drive 87/8- Successfully extubated to 3L [place]  Interim History / Subjective:  Had 2 bowel movements overnight, otherwise no overnight events.  This morning, patient is able to speak and answer questions, which is much improved from the last few days. He would like to work with PT to see if he can safely get out of bed.  Objective   Blood pressure 136/82, pulse (!) 59, temperature (!) 96.7 F (35.9 C), temperature source Axillary, resp. rate (!) 29, height 6' 1 (1.854 m), weight 75.7 kg, SpO2 96%. CVP:  [4 mmHg-9 mmHg] 7 mmHg      Intake/Output Summary (Last 24 hours) at 10/05/2024 0937 Last data filed at 10/05/2024 0700 Gross per 24 hour  Intake 801.49 ml  Output 1525 ml  Net -723.51 ml   Filed Weights   10/02/24 0500 10/03/24 0500 10/04/24 0500  Weight: 80.6 kg 77.2 kg 75.7 kg   Examination: General: tired-appearing male, NAD HENT: [place], [place], OG tube in place in right nare Lungs: clear to auscultation bilaterally, no wheezing. Cardiovascular: RRR, no murmurs/rubs/gallops, no JVD Abdomen: soft, positive bowel sounds, slight ttp of bilateral lower abdomen, no distension, no masses Extremities: Warm, slight edema to bilateral ankles. DP/PT pulses +1, Radial pulses 1+. Neuro: A&Ox4. Responding to voice, touch, and stimuli. Following commands, moving all 4 extremities.  GU: Foley  placed with yellow urine output  Pertinent labs, imaging, and tests in the past 24 hours: Na: 147 K: 4.0 BUN/Cr: 53/1.05 Cl: 109 Mag: 4.6 Phos: 2.7 Plts: 251 BG: 128 H/H: 12.2/37.6 WBC: 17.1  Resolved problem list  AGMA Shock Assessment and Plan   Respiratory Acute Hypoxic Respiratory Failure,  resolving 2/2 Severe Acute Respiratory Distress Syndrome from Acute Aspiration Pneumonia P: - Successful intubation on 12/1, saturating well on 3L on [place]. Not on oxygen at baseline at home - Completed stress dose steroids with 8 total days of Decadron  (5 days 20mg  and 3 days 10mg ) to cover for ARDS and septic shock. Will discontinue Decadron  today. - Completed 5 days of IV antibiotics with Zosyn  and CTX. - PRN Hydroxyzine 10mg  TID solution if patient becomes anxious/delirious. Not a home medication, may be able to d/c once discharged  - Bcx collected showing NGTD   Infection History of Hepatitis C with Advanced Fibrosis P: - s/p treatment  - Currently fibrotic per elastography 02/2023. RUQ US  on 03/2024 WNL. - Continue to follow outpatient with GI, already established.  Cardiac Acute HFreF EF 25-30%, suspect septic cardiomyopathy Shock, resolved P: - Maintaining MAPs 70-80s off of pressors  - Recommend repeating echo in outpatient setting to monitor for improvement in 2 months.  History of Hypertension P: - Holding Losartan  100mg  and Spironolactone  25mg , may resume Losartan  100mg  today if able to tolerate PO  Gastrointestinal: Abdominal Distension, acute P: - Daily BM, had 2 overnight - Continue Senna, colace for bowel regimen, PRN suppository if no BM in 2 days - Will add simethicone  for gas  Renal AKI, improving with diuresis P: - Cr today 1.05, improved from 1.27. Will continue to monitor daily.   Endocrine: Type 2 Diabetes Mellitus P: -SSI  -Semglee 8u daily, hold if CBG is < 110 since Decadron  has been discontinued -CBG q 4hr  Nutrition: P: - OG tube, no secretions or overnight emesis - CoreTrak for tube feeds - SLP reeval today, if safe swallow, will remove CoreTrak - Will discontinue PPI today  Other: Debility P: - Patient been bed-ridden x8 days given intubation for ARDS and shock, on TPN, and with new HFrEF - Would like to work with PT/OT today - Will  place PT/OT order to eval and follow.   Tubes/Lines/Therapy Tubes: OG tube, foley catheter Lines:  Left radial arterial line, 3 PIVs (2 right, 1 left) DVT Ppx:  Heparin  GI Ppx: none  Labs   CBC: Recent Labs  Lab 10/01/24 0801 10/02/24 0500 10/03/24 0539 10/04/24 0500 10/05/24 0420  WBC 32.1* 29.0* 26.3* 22.4* 17.1*  HGB 10.5* 11.2* 11.3* 11.7* 12.2*  HCT 30.1* 33.1* 34.2* 36.4* 37.6*  MCV 81.1 83.8 84.7 86.1 86.2  PLT 111* 115* 137* 197 251    Basic Metabolic Panel: Recent Labs  Lab 09/30/24 0536 10/01/24 0415 10/01/24 0417 10/01/24 2236 10/02/24 0500 10/03/24 0539 10/04/24 0500 10/05/24 0420  NA 134* 138 138  --  142 141 146* 147*  K 3.7 3.8 3.9  --  3.9 4.2 4.3 4.0  CL 99 102  --   --  105 103 106 109  CO2 23 25  --   --  27 29 30 29   GLUCOSE 160* 154*  --   --  196* 168* 162* 143*  BUN 27* 38*  --   --  50* 52* 53* 53*  CREATININE 1.17 1.24  --   --  1.23 1.14 1.27* 1.05  CALCIUM  7.9* 8.4*  --   --  8.2* 8.2* 8.5* 8.7*  MG 2.7* 2.7*  --   --  2.3 2.4  --  2.7*  PHOS 2.2* 1.2*  --  2.4* 3.3 2.7  --  4.6   GFR: Estimated Creatinine Clearance: 68.1 mL/min (by C-G formula based on SCr of 1.05 mg/dL). Recent Labs  Lab 09/29/24 0807 09/30/24 0536 10/01/24 0415 10/01/24 0801 10/02/24 0500 10/03/24 0539 10/04/24 0500 10/04/24 0950 10/05/24 0420  PROCALCITON  --   --   --   --   --   --   --  12.58  --   WBC  --  22.7*  --    < > 29.0* 26.3* 22.4*  --  17.1*  LATICACIDVEN 4.8* 3.2* 2.2*  --  1.8  --   --   --   --    < > = values in this interval not displayed.    Liver Function Tests: Recent Labs  Lab 09/29/24 0452 10/01/24 0415 10/02/24 0500 10/03/24 0539  AST 46* 35 39 34  ALT 25 26 31 29   ALKPHOS 40 76 88 92  BILITOT 2.0* 0.9 0.6 0.6  PROT 5.8* 5.4* 5.9* 5.7*  ALBUMIN 2.5* 2.0* 2.2* 2.2*   No results for input(s): LIPASE, AMYLASE in the last 168 hours.  No results for input(s): AMMONIA in the last 168 hours.  ABG    Component  Value Date/Time   PHART 7.474 (H) 10/01/2024 0417   PCO2ART 38.3 10/01/2024 0417   PO2ART 84 10/01/2024 0417   HCO3 27.9 10/01/2024 0417   TCO2 29 10/01/2024 0417   ACIDBASEDEF 4.0 (H) 09/29/2024 0306   O2SAT 96 10/01/2024 0417     Coagulation Profile: No results for input(s): INR, PROTIME in the last 168 hours.  Cardiac Enzymes: No results for input(s): CKTOTAL, CKMB, CKMBINDEX, TROPONINI in the last 168 hours.  HbA1C: Hgb A1c MFr Bld  Date/Time Value Ref Range Status  09/16/2017 09:58 AM 5.5 4.8 - 5.6 % Final    Comment:             Prediabetes: 5.7 - 6.4          Diabetes: >6.4          Glycemic control for adults with diabetes: <7.0   05/20/2015 10:10 AM 5.8 (H) <5.7 % Final    Comment:                                                                           According to the ADA Clinical Practice Recommendations for 2011, when HbA1c is used as a screening test:     >=6.5%   Diagnostic of Diabetes Mellitus            (if abnormal result is confirmed)   5.7-6.4%   Increased risk of developing Diabetes Mellitus   References:Diagnosis and Classification of Diabetes Mellitus,Diabetes Care,2011,34(Suppl 1):S62-S69 and Standards of Medical Care in         Diabetes - 2011,Diabetes Care,2011,34 (Suppl 1):S11-S61.       CBG: Recent Labs  Lab 10/04/24 1534 10/04/24 1942 10/04/24 2353 10/05/24 0324 10/05/24 0746  GLUCAP 153* 123* 132* 128* 150*    Past Medical History:  He,  has a past medical history  of Allergy, Arthritis, ED (erectile dysfunction), GERD (gastroesophageal reflux disease), Hepatitis C, Hyperlipidemia, and Hypertension.   Surgical History:   Past Surgical History:  Procedure Laterality Date   APPENDECTOMY     CHOLECYSTECTOMY N/A 04/25/2014   Procedure: LAPAROSCOPIC CHOLECYSTECTOMY;  Surgeon: Oneil DELENA Budge, MD;  Location: [facility];  Service: General;  Laterality: N/A;   COLONOSCOPY N/A 09/01/2019   Procedure: COLONOSCOPY;  Surgeon:  Golda Claudis PENNER, MD;  Location[facility];  Service: Endoscopy;  Laterality: N/A;  930   COLONOSCOPY N/A 09/27/2024   Procedure: COLONOSCOPY;  Surgeon: Eartha Angelia Sieving, MD;  Location[facility];  Service: Gastroenterology;  Laterality: N/A;  7:30am, ASA 1-2   HEMICOLECTOMY Right    lap hand assisted   POLYPECTOMY  09/01/2019   Procedure: POLYPECTOMY;  Surgeon: Golda Claudis PENNER, MD;  Location: [facility];  Service: Endoscopy;;  colon   right shoulder surgery Right 03/2005   rotator cuff repair     Social History:   reports that he quit smoking about 52 years ago. His smoking use included cigarettes. He started smoking about 67 years ago. He has a 4.4 pack-year smoking history. He has been exposed to tobacco smoke. He has never used smokeless tobacco. He reports that he does not currently use alcohol. He reports that he does not use drugs.   Family History:  His family history includes Cancer in his father; Diabetes in his brother; Drug abuse in his brother; Hypertension in his brother, daughter, mother, sister, and son.   Allergies Allergies  Allergen Reactions   Amlodipine  Other (See Comments)    unknown     Home Medications  Prior to Admission medications   Medication Sig Start Date End Date Taking? Authorizing Provider  losartan  (COZAAR ) 100 MG tablet TAKE 1 TABLET BY MOUTH EVERY DAY 03/24/24   Antonetta Rollene BRAVO, MD  meclizine  (ANTIVERT ) 25 MG tablet Take 1 tablet (25 mg total) by mouth 2 (two) times daily as needed for dizziness. 08/24/24   Tobie Suzzane POUR, MD  Misc. Devices MISC Blood pressure cuff/device - 1. ICD10: I10 08/24/24   Tobie Suzzane POUR, MD  spironolactone  (ALDACTONE ) 25 MG tablet TAKE 1 TABLET (25 MG TOTAL) BY MOUTH DAILY. 06/24/24   Antonetta Rollene BRAVO, MD     Critical care time: 19    Maize Brittingham, DO Internal Medicine Resident, PGY-1 Please contact the on call pager at [phone] for any urgent or emergent needs. 9:37 AM 10/05/2024

## 2024-10-05 NOTE — Progress Notes (Signed)
 Speech Language Pathology Treatment: Dysphagia  Patient Details Name: Bryce Martinez MRN: [id number] DOB: 10-Dec-1950 Today's Date: 10/05/2024 Time: 8889-8873 SLP Time Calculation (min) (ACUTE ONLY): 16 min  Assessment / Plan / Recommendation Clinical Impression  Pts mentation much improved today, oral motor exam WNL. Pts vocal quality still midly hoarse. Pt consumed 3 oz consecutively without stopping x2 and further tolerated consecutive sips of several ounces suggesting low risk of silent aspiration. Pt was able to masticate, but had very little interest in solid food. Will initiate mechanical soft diet and thin liquids. Plan to keep cortrak in place until pts interest in food improves.   HPI HPI: Pt is a 73 year old male admitted with ARDS due to aspiration pna after witnessed emesis during a routine colonoscopy. CXR was done and showed pneumonia in the left lung and possible pleural effusion. Patient then placed on BiPAP and HFNC, however remained dyspneic and tachypneic. Pt intubated 11/24-12/1. No prior history of dysphagia or esophageal dysphagia in chart.      SLP Plan  Continue with current plan of care        Swallow Evaluation Recommendations   Recommendations: PO diet PO Diet Recommendation: Dysphagia 3 (Mechanical soft);Thin liquids (Level 0) Liquid Administration via: Cup;Straw Medication Administration: Whole meds with puree Supervision: Staff to assist with self-feeding;Full assist for feeding Postural changes: Position pt fully upright for meals Oral care recommendations: Oral care BID (2x/day)     Recommendations                     Oral care QID     Dysphagia, unspecified (R13.10)     Continue with current plan of care     Leidi Astle, Consuelo Fitch  10/05/2024, 12:57 PM

## 2024-10-06 DIAGNOSIS — J80 Acute respiratory distress syndrome: Secondary | ICD-10-CM | POA: Diagnosis not present

## 2024-10-06 LAB — GLUCOSE, CAPILLARY
Glucose-Capillary: 134 mg/dL — ABNORMAL HIGH (ref 70–99)
Glucose-Capillary: 137 mg/dL — ABNORMAL HIGH (ref 70–99)
Glucose-Capillary: 138 mg/dL — ABNORMAL HIGH (ref 70–99)
Glucose-Capillary: 141 mg/dL — ABNORMAL HIGH (ref 70–99)
Glucose-Capillary: 146 mg/dL — ABNORMAL HIGH (ref 70–99)
Glucose-Capillary: 194 mg/dL — ABNORMAL HIGH (ref 70–99)

## 2024-10-06 MED ORDER — TRAMADOL HCL 50 MG PO TABS
25.0000 mg | ORAL_TABLET | Freq: Once | ORAL | Status: AC
  Administered 2024-10-06: 25 mg via ORAL
  Filled 2024-10-06: qty 1

## 2024-10-06 NOTE — Evaluation (Addendum)
 Physical Therapy Evaluation Patient Details Name: Bryce Martinez MRN: [id number] DOB: May 23, 1951 Today's Date: 10/06/2024  History of Present Illness  73 yo male admitted 11/24 with ARDS due to aspiration PNA after witnessed emesis during routine colonscopy at [facility]. intubated 11/24-12/1  COVID (+) PMH: OA, GERD, Hep C with advanced fibrosis, HLD, HTN, R TSR DM2, smoker   Clinical Impression  Pt admitted with above diagnosis. PTA pt lived at home with wife, independent, driving, and employed. Pt currently with functional limitations due to the deficits listed below (see PT Problem List). On eval, pt required mod assist bed mobility, and max assist transfers. Increased time to complete all functional mobility skills. He demo fair sitting balance and poor standing balance. Deficits also noted in strength and activity tolerance. Pt removed from supplemental O2 just prior to PT eval. VSS on RA. Pt will benefit from acute skilled PT to increase their independence and safety with mobility to allow discharge. Post acute, pt would benefit from further therapy in inpatient setting > 3 hours/day.         If plan is discharge home, recommend the following: Two people to help with walking and/or transfers;Two people to help with bathing/dressing/bathroom   Can travel by private vehicle        Equipment Recommendations Other (comment) (TBD, pending progress)  Recommendations for Other Services  Rehab consult    Functional Status Assessment Patient has had a recent decline in their functional status and demonstrates the ability to make significant improvements in function in a reasonable and predictable amount of time.     Precautions / Restrictions Precautions Precautions: Fall;Other (comment) Recall of Precautions/Restrictions: Intact Precaution/Restrictions Comments: coretrak      Mobility  Bed Mobility Overal bed mobility: Needs Assistance Bed Mobility: Supine to Sit     Supine to  sit: Mod assist, HOB elevated, Used rails     General bed mobility comments: increased time, cues for sequencing, assist with BLE and trunk    Transfers Overall transfer level: Needs assistance Equipment used: Rolling walker (2 wheels) Transfers: Sit to/from Stand, Bed to chair/wheelchair/BSC Sit to Stand: Max assist     Squat pivot transfers: Max assist     General transfer comment: face to face assist squat pivot transfer bed to recliner. Attempted sit to stand from recliner with RW. Pt able to clear bottom from recliner but unable to power up to stand.    Ambulation/Gait               General Gait Details: unable  Stairs            Wheelchair Mobility     Tilt Bed    Modified Rankin (Stroke Patients Only)       Balance Overall balance assessment: Needs assistance Sitting-balance support: Bilateral upper extremity supported, Feet supported Sitting balance-Leahy Scale: Fair   Postural control: Posterior lean Standing balance support: Bilateral upper extremity supported, During functional activity Standing balance-Leahy Scale: Poor                               Pertinent Vitals/Pain Pain Assessment Pain Assessment: No/denies pain    Home Living Family/patient expects to be discharged to:: Private residence Living Arrangements: Spouse/significant other Available Help at Discharge: Family;Available 24 hours/day Type of Home: House Home Access: Stairs to enter Entrance Stairs-Rails: Doctor, General Practice of Steps: 3   Home Layout: Laundry or work area in basement;One level Home  Equipment: None      Prior Function Prior Level of Function : Independent/Modified Independent;Working/employed;Driving             Mobility Comments: Pt worked part time, 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       Extremity/Trunk Assessment   Upper Extremity Assessment Upper Extremity Assessment: Generalized weakness    Lower  Extremity Assessment Lower Extremity Assessment: Generalized weakness    Cervical / Trunk Assessment Cervical / Trunk Assessment: Normal  Communication   Communication Communication: Impaired Factors Affecting Communication: Reduced clarity of speech (soft voice)    Cognition Arousal: Alert Behavior During Therapy: Flat affect   PT - Cognitive impairments: No apparent impairments                         Following commands: Intact       Cueing Cueing Techniques: Verbal cues     General Comments General comments (skin integrity, edema, etc.): VSS on RA    Exercises     Assessment/Plan    PT Assessment Patient needs continued PT services  PT Problem List Decreased strength;Decreased activity tolerance;Decreased knowledge of use of DME;Decreased balance;Decreased mobility       PT Treatment Interventions DME instruction;Therapeutic exercise;Gait training;Balance training;Stair training;Functional mobility training;Therapeutic activities;Patient/family education    PT Goals (Current goals can be found in the Care Plan section)  Acute Rehab PT Goals Patient Stated Goal: home PT Goal Formulation: With patient/family Time For Goal Achievement: 10/20/24 Potential to Achieve Goals: Good    Frequency Min 3X/week     Co-evaluation               AM-PAC PT 6 Clicks Mobility  Outcome Measure Help needed turning from your back to your side while in a flat bed without using bedrails?: A Lot Help needed moving from lying on your back to sitting on the side of a flat bed without using bedrails?: A Lot Help needed moving to and from a bed to a chair (including a wheelchair)?: A Lot Help needed standing up from a chair using your arms (e.g., wheelchair or bedside chair)?: Total Help needed to walk in hospital room?: Total Help needed climbing 3-5 steps with a railing? : Total 6 Click Score: 9    End of Session Equipment Utilized During Treatment: Gait  belt Activity Tolerance: Patient tolerated treatment well Patient left: in chair;with call bell/phone within reach;with family/visitor present Nurse Communication: Mobility status PT Visit Diagnosis: Other abnormalities of gait and mobility (R26.89);Muscle weakness (generalized) (M62.81)    Time: 9073-9049 PT Time Calculation (min) (ACUTE ONLY): 24 min   Charges:   PT Evaluation $PT Eval Moderate Complexity: 1 Mod PT Treatments $Therapeutic Activity: 8-22 mins PT General Charges $$ ACUTE PT VISIT: 1 Visit         Sari MATSU., PT  Office # [phone]   Erven Sari Shaker 10/06/2024, 11:36 AM

## 2024-10-06 NOTE — Progress Notes (Signed)
  Inpatient Rehab Admissions Coordinator :  Per therapy recommendations, patient was screened for CIR candidacy by Ottie Glazier RN MSN.  At this time patient appears to be a potential candidate for CIR. I will place a rehab consult per protocol for full assessment. Please call me with any questions.  Ottie Glazier RN MSN Admissions Coordinator [phone]

## 2024-10-06 NOTE — Plan of Care (Signed)
  Problem: Education: Goal: Knowledge of General Education information will improve Description: Including pain rating scale, medication(s)/side effects and non-pharmacologic comfort measures Outcome: Progressing   Problem: Health Behavior/Discharge Planning: Goal: Ability to manage health-related needs will improve Outcome: Progressing   Problem: Clinical Measurements: Goal: Ability to maintain clinical measurements within normal limits will improve Outcome: Progressing Goal: Will remain free from infection Outcome: Progressing Goal: Diagnostic test results will improve Outcome: Progressing Goal: Respiratory complications will improve Outcome: Progressing Goal: Cardiovascular complication will be avoided Outcome: Progressing   Problem: Activity: Goal: Risk for activity intolerance will decrease Outcome: Progressing   Problem: Nutrition: Goal: Adequate nutrition will be maintained Outcome: Progressing   Problem: Coping: Goal: Level of anxiety will decrease Outcome: Progressing   Problem: Elimination: Goal: Will not experience complications related to bowel motility Outcome: Progressing Goal: Will not experience complications related to urinary retention Outcome: Progressing   Problem: Pain Managment: Goal: General experience of comfort will improve and/or be controlled Outcome: Progressing   Problem: Safety: Goal: Ability to remain free from injury will improve Outcome: Progressing   Problem: Skin Integrity: Goal: Risk for impaired skin integrity will decrease Outcome: Progressing   Problem: Education: Goal: Knowledge of risk factors and measures for prevention of condition will improve Outcome: Progressing   Problem: Coping: Goal: Psychosocial and spiritual needs will be supported Outcome: Progressing   Problem: Respiratory: Goal: Will maintain a patent airway Outcome: Progressing Goal: Complications related to the disease process, condition or treatment  will be avoided or minimized Outcome: Progressing   Problem: Activity: Goal: Ability to tolerate increased activity will improve Outcome: Progressing   Problem: Respiratory: Goal: Ability to maintain a clear airway and adequate ventilation will improve Outcome: Progressing   Problem: Role Relationship: Goal: Method of communication will improve Outcome: Progressing   Problem: Education: Goal: Ability to describe self-care measures that may prevent or decrease complications (Diabetes Survival Skills Education) will improve Outcome: Progressing Goal: Individualized Educational Video(s) Outcome: Progressing   Problem: Coping: Goal: Ability to adjust to condition or change in health will improve Outcome: Progressing   Problem: Fluid Volume: Goal: Ability to maintain a balanced intake and output will improve Outcome: Progressing   Problem: Health Behavior/Discharge Planning: Goal: Ability to identify and utilize available resources and services will improve Outcome: Progressing Goal: Ability to manage health-related needs will improve Outcome: Progressing   Problem: Metabolic: Goal: Ability to maintain appropriate glucose levels will improve Outcome: Progressing   Problem: Nutritional: Goal: Maintenance of adequate nutrition will improve Outcome: Progressing Goal: Progress toward achieving an optimal weight will improve Outcome: Progressing   Problem: Skin Integrity: Goal: Risk for impaired skin integrity will decrease Outcome: Progressing   Problem: Tissue Perfusion: Goal: Adequacy of tissue perfusion will improve Outcome: Progressing

## 2024-10-06 NOTE — TOC Initial Note (Signed)
 Transition of Care [facility]) - Initial/Assessment Note    Patient Details  Name: Bryce Martinez MRN: [id number] Date of Birth: [birth year]  [facility] CM/SW Contact:    Lauraine FORBES Saa, LCSWA Phone Number: 10/06/2024, 12:08 PM  Clinical Narrative:                  12:08 PM Per progressions, patient has been extubated and remains with cortrak. Per chart review, PT recommended patient discharge to [facility]. TOC will continue to follow.  Expected Discharge Plan: IP Rehab Facility Barriers to Discharge: Continued Medical Work up, English As A Second Language Teacher   Patient Goals and CMS Choice            Expected Discharge Plan and Services   Discharge Planning Services: CM Consult   Living arrangements for the past 2 months: Single Family Home                                      Prior Living Arrangements/Services Living arrangements for the past 2 months: Single Family Home Lives with:: Spouse Patient language and need for interpreter reviewed:: Yes        Need for Family Participation in Patient Care: No (Comment)     Criminal Activity/Legal Involvement Pertinent to Current Situation/Hospitalization: No - Comment as needed  Activities of Daily Living   ADL Screening (condition at time of admission) Independently performs ADLs?: Yes (appropriate for developmental age) Is the patient deaf or have difficulty hearing?: No Does the patient have difficulty seeing, even when wearing glasses/contacts?: No Does the patient have difficulty concentrating, remembering, or making decisions?: No  Permission Sought/Granted Permission sought to share information with : Family Supports Permission granted to share information with : No (Contact information on chart)  Share Information with NAME: Gennie Dib     Permission granted to share info w Relationship: Spouse  Permission granted to share info w Contact Information: [phone]  Emotional Assessment        Orientation: : Oriented to Self, Oriented to Place, Oriented to Situation Alcohol / Substance Use: Not Applicable Psych Involvement: No (comment)  Admission diagnosis:  ARDS (adult respiratory distress syndrome) (HCC) [J80] Acute hypoxic respiratory failure (HCC) [J96.01] Patient Active Problem List   Diagnosis Date Noted   ARDS (adult respiratory distress syndrome) (HCC) 09/27/2024   Acute hypoxic respiratory failure (HCC) 09/27/2024   Vertigo 08/24/2024   Encounter for Medicare annual examination with abnormal findings 05/21/2024   Hematuria 05/21/2024   Vitamin D  deficiency 10/30/2023   Special screening for malignant neoplasm of prostate 10/30/2023   Tubular adenoma of colon 05/20/2023   Light headedness 05/20/2023   Carotid bruit 05/20/2023   Mild epistaxis 11/25/2022   Sinusitis 10/09/2022   Hepatic fibrosis 11/06/2021   History of colonic polyps 11/06/2021   Ganglion cyst of finger 09/21/2021   Hypercalcemia 08/06/2021   Family history of colon cancer 06/28/2019   Hearing loss 06/03/2018   Spinal stenosis of lumbar region 09/03/2016   History of hepatitis C 07/26/2016   Right-sided low back pain with right-sided sciatica 07/26/2016   Neck pain 10/10/2014   Hepatitis C antibody test positive 10/10/2014   Allergic rhinitis 07/11/2011   ED (erectile dysfunction) 07/11/2011   Dyslipidemia 01/13/2008   Essential hypertension 01/13/2008   PCP:  Antonetta Rollene FORBES, MD Pharmacy:   [facility] [phone] - , [place] - [address] [facility] [address] [place] [address] Phone: [phone] Fax: [phone]  [facility] [address], [place] - [address] [place] [address] [place] [address] [place] [address] Phone: [phone] Fax: [phone]  [facility] - [place], [place] - [address] [place] [address] Phone: [phone] Fax: [phone]     Social Drivers of Health (SDOH) Social  History: SDOH Screenings   Food Insecurity: No Food Insecurity (09/29/2024)  Housing: Low Risk  (09/29/2024)  Transportation Needs: No Transportation Needs (09/29/2024)  Utilities: Not At Risk (09/29/2024)  Alcohol Screen: Low Risk  (06/28/2024)  Depression (PHQ2-9): Low Risk  (08/24/2024)  Financial Resource Strain: Low Risk  (06/28/2024)  Physical Activity: Insufficiently Active (06/28/2024)  Social Connections: Moderately Integrated (09/29/2024)  Stress: No Stress Concern Present (06/28/2024)  Tobacco Use: Medium Risk (09/27/2024)  Health Literacy: Adequate Health Literacy (06/29/2024)   SDOH Interventions:     Readmission Risk Interventions     No data to display

## 2024-10-06 NOTE — Progress Notes (Signed)
 PROGRESS NOTE  Bryce Martinez FMW:[id number] DOB: 08/08/1951 DOA: 09/27/2024 PCP: Antonetta Rollene BRAVO, MD   LOS: 9 days   Brief Narrative / Interim history: 73 year old male with history of HTN, HLD, hep C status posttreatment, advanced fibrosis was initially seen at [facility] on 11/24 for routine screening colonoscopy, he apparently aspirated during the procedure, monitored and discharged home on room air.  At home she has had worsening respiratory symptoms, was brought back requiring 15 L, respiratory status got worse and eventually ended up being intubated.  He developed presumed ARDS due to aspiration.  Incidentally he was found to be COVID-positive as well.  Eventually extubated, and transferred to the hospitalist service 12/3  Significant events: 11/24 -Colonoscopy at [facility] with Dr. Eartha Flavors, postop O2 requirements showing 93% on RA at discharge. 11/24- Trialed 15L NRB, BiPAP without respiratory improvement. PCCM consulted for admission and worsening respiratory status,  intubated in [facility] ED.  11/24- Left radial arterial line placed  11/24- 2030, CVC was placed for increased Levophed  requirements 11/28- Failed SBT, will switch to Precedex for sedation to improve respiratory drive 87/8- Successfully extubated to 3L [place]  Subjective / 24h Interval events: He is doing well, feels comfortable.  Wife is at bedside.  He feels weak overall  Assesement and Plan: Principal problem Acute hypoxic respiratory failure due to ARDS from aspiration pneumonia -requiring intubation initially.  He is status post 5 days of antibiotics, now completed.  He was eventually extubated on 12/1, respiratory status much better, was on 3 L yesterday but appears to have been weaned off to room air today - Mobilize more, out of the ICU, PT evaluation pending  Active problems Essential hypertension-continue losartan , blood pressure stable  Acute systolic CHF -thought to be due to septic  cardiomyopathy, shock.  2D echocardiogram showed LVEF 25 to 30%, global hypokinesis.  RV systolic function was normal.  He is on losartan , continue.  Anticipate recovery, will need repeat echo in 3 to 4 weeks as an outpatient  History of hep C, advanced fibrosis -outpatient follow-up with GI  AKI-creatinine improved  DM2 -continue sliding scale  Deconditioning -in bed for the past 8 days due to intubation, ARDS, shock.  PT/OT pending  Scheduled Meds:  Chlorhexidine  Gluconate Cloth  6 each Topical Daily   docusate  100 mg Per Tube BID   feeding supplement (PROSource TF20)  60 mL Per Tube Daily   guaiFENesin  10 mL Per Tube BID   heparin   5,000 Units Subcutaneous Q8H   insulin  aspart  0-20 Units Subcutaneous Q4H   insulin  glargine-yfgn  8 Units Subcutaneous Daily   lidocaine   1 patch Transdermal Daily   losartan   100 mg Per Tube Daily   mouth rinse  15 mL Mouth Rinse TID   senna  1 tablet Per Tube QHS   Continuous Infusions:  feeding supplement (OSMOLITE 1.5 CAL) 65 mL/hr at 10/06/24 1000   PRN Meds:.acetaminophen , hydrOXYzine, mouth rinse  Current Outpatient Medications  Medication Instructions   losartan  (COZAAR ) 100 mg, Oral, Daily   meclizine  (ANTIVERT ) 25 mg, Oral, 2 times daily PRN   Misc. Devices MISC Blood pressure cuff/device - 1. ICD10: I10   spironolactone  (ALDACTONE ) 25 mg, Oral, Daily    Diet Orders (From admission, onward)     Start     Ordered   10/05/24 1256  DIET DYS 3 Room service appropriate? Yes with Assist; Fluid consistency: Thin  Diet effective now       Question  Answer Comment  Room service appropriate? Yes with Assist   Fluid consistency: Thin      10/05/24 1255            DVT prophylaxis: heparin  injection 5,000 Units Start: 09/27/24 1630   Lab Results  Component Value Date   PLT 251 10/05/2024      Code Status: Full Code  Family Communication: Wife at bedside  Status is: Inpatient Remains inpatient appropriate because: Severity  of illness   Level of care: Telemetry  Consultants:  PCCM  Objective: Vitals:   10/06/24 0722 10/06/24 0800 10/06/24 0900 10/06/24 1000  BP:  126/67 115/61 119/61  Pulse:  68 71   Resp:  19 (!) 27 20  Temp: 97.8 F (36.6 C)     TempSrc: Oral     SpO2:  97% 98%   Weight:      Height:        Intake/Output Summary (Last 24 hours) at 10/06/2024 1054 Last data filed at 10/06/2024 1000 Gross per 24 hour  Intake 1730.58 ml  Output 700 ml  Net 1030.58 ml   Wt Readings from Last 3 Encounters:  10/05/24 73.5 kg  09/27/24 81.2 kg  08/24/24 79.2 kg    Examination:  Constitutional: NAD Eyes: no scleral icterus ENMT: Mucous membranes are moist.  Neck: normal, supple Respiratory: Diffuse rhonchi at the bases, no wheezing Cardiovascular: Regular rate and rhythm, no murmurs / rubs / gallops. No LE edema.  Abdomen: non distended, no tenderness. Bowel sounds positive.  Musculoskeletal: no clubbing / cyanosis.   Data Reviewed: I have independently reviewed following labs and imaging studies   CBC Recent Labs  Lab 10/01/24 0801 10/02/24 0500 10/03/24 0539 10/04/24 0500 10/05/24 0420  WBC 32.1* 29.0* 26.3* 22.4* 17.1*  HGB 10.5* 11.2* 11.3* 11.7* 12.2*  HCT 30.1* 33.1* 34.2* 36.4* 37.6*  PLT 111* 115* 137* 197 251  MCV 81.1 83.8 84.7 86.1 86.2  MCH 28.3 28.4 28.0 27.7 28.0  MCHC 34.9 33.8 33.0 32.1 32.4  RDW 13.2 13.7 13.7 14.0 14.2    Recent Labs  Lab 09/30/24 0536 10/01/24 0415 10/01/24 0417 10/02/24 0500 10/03/24 0539 10/04/24 0500 10/04/24 0950 10/05/24 0420  NA 134* 138 138 142 141 146*  --  147*  K 3.7 3.8 3.9 3.9 4.2 4.3  --  4.0  CL 99 102  --  105 103 106  --  109  CO2 23 25  --  27 29 30   --  29  GLUCOSE 160* 154*  --  196* 168* 162*  --  143*  BUN 27* 38*  --  50* 52* 53*  --  53*  CREATININE 1.17 1.24  --  1.23 1.14 1.27*  --  1.05  CALCIUM  7.9* 8.4*  --  8.2* 8.2* 8.5*  --  8.7*  AST  --  35  --  39 34  --   --   --   ALT  --  26  --  31 29   --   --   --   ALKPHOS  --  76  --  88 92  --   --   --   BILITOT  --  0.9  --  0.6 0.6  --   --   --   ALBUMIN  --  2.0*  --  2.2* 2.2*  --   --   --   MG 2.7* 2.7*  --  2.3 2.4  --   --  2.7*  PROCALCITON  --   --   --   --   --   --  12.58  --   LATICACIDVEN 3.2* 2.2*  --  1.8  --   --   --   --     ------------------------------------------------------------------------------------------------------------------ No results for input(s): CHOL, HDL, LDLCALC, TRIG, CHOLHDL, LDLDIRECT in the last 72 hours.  Lab Results  Component Value Date   HGBA1C 5.5 09/16/2017   ------------------------------------------------------------------------------------------------------------------ No results for input(s): TSH, T4TOTAL, T3FREE, THYROIDAB in the last 72 hours.  Invalid input(s): FREET3  Cardiac Enzymes No results for input(s): CKMB, TROPONINI, MYOGLOBIN in the last 168 hours.  Invalid input(s): CK ------------------------------------------------------------------------------------------------------------------    Component Value Date/Time   BNP 251.0 (H) 09/28/2024 0238    CBG: Recent Labs  Lab 10/05/24 1508 10/05/24 2002 10/05/24 2327 10/06/24 0323 10/06/24 0725  GLUCAP 168* 201* 151* 138* 134*    Recent Results (from the past 240 hours)  Culture, blood (routine x 2)     Status: None   Collection Time: 09/27/24 11:06 AM   Specimen: BLOOD  Result Value Ref Range Status   Specimen Description BLOOD BLOOD RIGHT ARM FOREARM  Final   Special Requests   Final    BOTTLES DRAWN AEROBIC AND ANAEROBIC Blood Culture results may not be optimal due to an inadequate volume of blood received in culture bottles   Culture   Final    NO GROWTH 5 DAYS Performed at [facility], [address] [place], [place] [address]    Report Status 10/02/2024 FINAL  Final  Culture, blood (routine x 2)     Status: None   Collection Time: 09/27/24 11:06 AM   Specimen:  BLOOD  Result Value Ref Range Status   Specimen Description BLOOD BLOOD RIGHT ARM  Final   Special Requests   Final    BOTTLES DRAWN AEROBIC AND ANAEROBIC Blood Culture results may not be optimal due to an inadequate volume of blood received in culture bottles   Culture   Final    NO GROWTH 5 DAYS Performed at [facility], [address] [place], [place] [address]    Report Status 10/02/2024 FINAL  Final  MRSA Next Gen by PCR, Nasal     Status: None   Collection Time: 09/27/24  4:20 PM   Specimen: Nasal Mucosa; Nasal Swab  Result Value Ref Range Status   MRSA by PCR Next Gen NOT DETECTED NOT DETECTED Final    Comment: (NOTE) The GeneXpert MRSA Assay (FDA approved for NASAL specimens only), is one component of a comprehensive MRSA colonization surveillance program. It is not intended to diagnose MRSA infection nor to guide or monitor treatment for MRSA infections. Test performance is not FDA approved in patients less than 78 years old. Performed at [facility], [address], [place], [place] [address]   Resp panel by RT-PCR (RSV, Flu A&B, Covid) Anterior Nasal Swab     Status: Abnormal   Collection Time: 09/27/24  4:33 PM   Specimen: Anterior Nasal Swab  Result Value Ref Range Status   SARS Coronavirus 2 by RT PCR POSITIVE (A) NEGATIVE Final   Influenza A by PCR NEGATIVE NEGATIVE Final   Influenza B by PCR NEGATIVE NEGATIVE Final    Comment: (NOTE) The Xpert Xpress SARS-CoV-2/FLU/RSV plus assay is intended as an aid in the diagnosis of influenza from Nasopharyngeal swab specimens and should not be used as a sole basis for treatment. Nasal washings and aspirates are unacceptable for  Xpert Xpress SARS-CoV-2/FLU/RSV testing.  Fact Sheet for Patients: bloggercourse.com  Fact Sheet for Healthcare Providers: seriousbroker.it  This test is not yet approved or cleared by the [place]  FDA and has been authorized for  detection and/or diagnosis of SARS-CoV-2 by FDA under an Emergency Use Authorization (EUA). This EUA will remain in effect (meaning this test can be used) for the duration of the COVID-19 declaration under Section 564(b)(1) of the Act, 21 U.S.C. section 360bbb-3(b)(1), unless the authorization is terminated or revoked.     Resp Syncytial Virus by PCR NEGATIVE NEGATIVE Final    Comment: (NOTE) Fact Sheet for Patients: bloggercourse.com  Fact Sheet for Healthcare Providers: seriousbroker.it  This test is not yet approved or cleared by the [place]  FDA and has been authorized for detection and/or diagnosis of SARS-CoV-2 by FDA under an Emergency Use Authorization (EUA). This EUA will remain in effect (meaning this test can be used) for the duration of the COVID-19 declaration under Section 564(b)(1) of the Act, 21 U.S.C. section 360bbb-3(b)(1), unless the authorization is terminated or revoked.  Performed at [facility], [address], [place], [place] [address]   Culture, Respiratory w Gram Stain     Status: None   Collection Time: 09/27/24  4:34 PM   Specimen: Tracheal Aspirate; Respiratory  Result Value Ref Range Status   Specimen Description TRACHEAL ASPIRATE  Final   Special Requests Normal  Final   Gram Stain   Final    RARE WBC SEEN RARE GRAM NEGATIVE RODS Performed at [facility], [address], [place], [place] [address]    Culture FEW ESCHERICHIA COLI  Final   Report Status 09/30/2024 FINAL  Final   Organism ID, Bacteria ESCHERICHIA COLI  Final      Susceptibility   Escherichia coli - MIC*    AMPICILLIN  >=32 RESISTANT Resistant     CEFAZOLIN (NON-URINE) 16 RESISTANT Resistant     CEFEPIME <=0.12 SENSITIVE Sensitive     ERTAPENEM <=0.12 SENSITIVE Sensitive     CEFTRIAXONE 0.5 SENSITIVE Sensitive     CIPROFLOXACIN  <=0.06 SENSITIVE Sensitive     GENTAMICIN <=1 SENSITIVE Sensitive     MEROPENEM  <=0.25 SENSITIVE Sensitive     TRIMETH /SULFA  <=20 SENSITIVE Sensitive     AMPICILLIN /SULBACTAM 16 INTERMEDIATE Intermediate     PIP/TAZO Value in next row Sensitive      <=4 SENSITIVEThis is a modified FDA-approved test that has been validated and its performance characteristics determined by the reporting laboratory.  This laboratory is certified under the Clinical Laboratory Improvement Amendments CLIA as qualified to perform high complexity clinical laboratory testing.    * FEW ESCHERICHIA COLI     Radiology Studies: No results found.   Nilda Fendt, MD, PhD [facility]  Between 7 am - 7 pm I am available, please contact me via Amion (for emergencies) or Securechat (non urgent messages)  Between 7 pm - 7 am I am not available, please contact night coverage MD/APP via Amion

## 2024-10-06 NOTE — Evaluation (Signed)
 Occupational Therapy Evaluation Patient Details Name: Bryce Martinez MRN: [id number] DOB: 10-10-1951 Today's Date: 10/06/2024   History of Present Illness   73 yo male admitted 11/24 with ARDS due to aspiration PNA after witnessed emesis during routine colonscopy at [facility]. intubated 11/24-12/1  COVID (+) PMH: OA, GERD, Hep C with advanced fibrosis, HLD, HTN, R TSR DM2, smoker     Clinical Impressions At baseline, pt is Independent with ADLs, IADLs, and functional mobility without an AD. Pt drives and works part-time. Pt now presents with decreased activity tolerance, generalized B UE weakness, decreased B UE coordination, decreased balance, decreased knowledge of DME/AE, and decreased safety and independence with functional tasks. Pt currently largely demonstrating ability to complete ADLs with Set up to Total assist +2, bed mobility with up to Max assist +2, and functional transfers with a RW with Max assist +2. Pt VSS on RA. Pt participated well in session, is motivated to return to PLOF, and has good family support. Pt will benefit from acute OT services to address deficits and increase safety and independence with functional tasks. Post acute discharge, pt will benefit from intensive inpatient skilled rehab services > 3 hours per day to maximize rehab potential.      If plan is discharge home, recommend the following:   Two people to help with walking and/or transfers;Two people to help with bathing/dressing/bathroom;Assistance with cooking/housework;Assistance with feeding;Assist for transportation;Help with stairs or ramp for entrance     Functional Status Assessment   Patient has had a recent decline in their functional status and demonstrates the ability to make significant improvements in function in a reasonable and predictable amount of time.     Equipment Recommendations   BSC/3in1;Tub/shower bench;Other (comment) (RW; other TBD based on pt progress)      Recommendations for Other Services   Rehab consult     Precautions/Restrictions   Precautions Precautions: Fall;Other (comment) Recall of Precautions/Restrictions: Intact Precaution/Restrictions Comments: coretrak; airborne precautions Restrictions Weight Bearing Restrictions Per Provider Order: No     Mobility Bed Mobility Overal bed mobility: Needs Assistance Bed Mobility: Sit to Supine       Sit to supine: Max assist, +2 for physical assistance, +2 for safety/equipment, HOB elevated, Used rails   General bed mobility comments: assist to manage trunk and to elevate B LE into bed    Transfers Overall transfer level: Needs assistance Equipment used: Rolling walker (2 wheels) Transfers: Sit to/from Stand, Bed to chair/wheelchair/BSC Sit to Stand: Max assist, +2 physical assistance, +2 safety/equipment   Squat pivot transfers: Max assist, +2 safety/equipment, +2 physical assistance       General transfer comment: Pt requiring +2 assist to power up from sitting in the recliner; cues for technique and hand placement      Balance Overall balance assessment: Needs assistance Sitting-balance support: Bilateral upper extremity supported, Single extremity supported, Feet supported Sitting balance-Leahy Scale: Fair   Postural control: Posterior lean (mild) Standing balance support: Bilateral upper extremity supported, During functional activity, Reliant on assistive device for balance Standing balance-Leahy Scale: Poor Standing balance comment: reliant on B UE support and assist of therapist                           ADL either performed or assessed with clinical judgement   ADL Overall ADL's : Needs assistance/impaired Eating/Feeding: Set up;Minimal assistance;Sitting   Grooming: Minimal assistance;Moderate assistance;Sitting   Upper Body Bathing: Moderate assistance;Maximal assistance;Sitting   Lower Body  Bathing: Total assistance;+2 for physical  assistance;+2 for safety/equipment;Sit to/from stand   Upper Body Dressing : Moderate assistance;Sitting (with increased time)   Lower Body Dressing: Total assistance;+2 for physical assistance;+2 for safety/equipment;Sit to/from stand   Toilet Transfer: Maximal assistance;Squat-pivot;Rolling walker (2 wheels);BSC/3in1 Toilet Transfer Details (indicate cue type and reason): simulated from recliner to bed Toileting- Clothing Manipulation and Hygiene: Set up;Contact guard assist;Bed level;Total assistance;+2 for physical assistance;+2 for safety/equipment;Sit to/from stand Toileting - Clothing Manipulation Details (indicate cue type and reason): Set up to CGA for use of handheld urinal; Total assist +2 for pericare in stit/stand     Functional mobility during ADLs:  (deferred this session) General ADL Comments: Pt with decreased activity tolerance, fatiguing quickly during tasks. Pt also requiring increased time for motor planning and with funcitonal level affected by generalized weakness.     Vision Patient Visual Report: No change from baseline Additional Comments: Vision [facility] for tasks assessed; not formally screened or evaluated     Perception         Praxis         Pertinent Vitals/Pain Pain Assessment Pain Assessment: No/denies pain     Extremity/Trunk Assessment Upper Extremity Assessment Upper Extremity Assessment: Left hand dominant;Generalized weakness;RUE deficits/detail;LUE deficits/detail RUE Deficits / Details: hx of rotator cuff injury with repair several years ago with mildly decreased shoulder AROM at baseline secondary to this; AROM otherwise WFL; generalized weakness; decreased coordination with pt requiring mildly increased time for motor planning RUE Sensation: WNL RUE Coordination: decreased gross motor;decreased fine motor LUE Deficits / Details: AROM shoulder flexion to approximately 85 degrees and AAROM WFL; AROM otherwise WFL; generalized weakness;  decreased coordination with pt requiring mildly increased time for motor planning LUE Sensation: WNL LUE Coordination: decreased fine motor;decreased gross motor   Lower Extremity Assessment Lower Extremity Assessment: Defer to PT evaluation   Cervical / Trunk Assessment Cervical / Trunk Assessment: Normal   Communication Communication Communication: Impaired Factors Affecting Communication: Reduced clarity of speech (soft voice)   Cognition Arousal: Alert Behavior During Therapy: Flat affect Cognition: No apparent impairments             OT - Cognition Comments: Pt AAOx4 and pleasant throughout session with cognition WFL for tasks assessed.                 Following commands: Intact       Cueing  General Comments   Cueing Techniques: Verbal cues  VSS on RA. Pt's sister present and supportive throughout session. RN present and assisting throughout session.   Exercises     Shoulder Instructions      Home Living Family/patient expects to be discharged to:: Private residence Living Arrangements: Spouse/significant other Available Help at Discharge: Family;Available 24 hours/day Type of Home: House Home Access: Stairs to enter [employer] of Steps: 3 Entrance Stairs-Rails: Right;Left Home Layout: Laundry or work area in basement;One level     Bathroom Shower/Tub: Chief Strategy Officer: Standard     Home Equipment: None          Prior Functioning/Environment Prior Level of Function : Independent/Modified Independent;Working/employed;Driving             Mobility Comments: Independent without an AD. ADLs Comments: Independent with ADLs and IADLs. Drives. 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    OT Problem List: Decreased strength;Decreased activity tolerance;Impaired balance (sitting and/or standing);Decreased coordination;Decreased knowledge of use of DME or AE   OT  Treatment/Interventions: Self-care/ADL training;Therapeutic exercise;Energy conservation;DME and/or AE instruction;Therapeutic activities;Patient/family education;Balance training      OT Goals(Current goals can be found in the care plan section)   Acute Rehab OT Goals Patient Stated Goal: to feel better and return home OT Goal Formulation: With patient Time For Goal Achievement: 10/20/24 Potential to Achieve Goals: Good ADL Goals Pt Will Perform Grooming: with contact guard assist;standing Pt Will Perform Upper Body Bathing: with contact guard assist;sitting Pt Will Perform Lower Body Dressing: with contact guard assist;sitting/lateral leans;sit to/from stand Pt Will Transfer to Toilet: with contact guard assist;ambulating;regular height toilet;grab bars (with least restrictive AD) Pt Will Perform Toileting - Clothing Manipulation and hygiene: with contact guard assist;sit to/from stand Pt Will Perform Tub/Shower Transfer: Tub transfer;with contact guard assist;ambulating;tub bench (with least restrictive AD) Additional ADL Goal #1: Patient will demonstrate ability to participate in 5 or more minutes of a functional or therapeutic activity without the need for a rest break to increase safety and independence with functional tasks.   OT Frequency:  Min 2X/week    Co-evaluation              AM-PAC OT 6 Clicks Daily Activity     Outcome Measure Help from another person eating meals?: A Little Help from another person taking care of personal grooming?: A Lot Help from another person toileting, which includes using toliet, bedpan, or urinal?: A Lot Help from another person bathing (including washing, rinsing, drying)?: A Lot Help from another person to put on and taking off regular upper body clothing?: A Lot Help from another person to put on and taking off regular lower body clothing?: Total 6 Click Score: 12   End of Session Equipment Utilized During Treatment: Gait  belt;Rolling walker (2 wheels) Nurse Communication: Mobility status;Other (comment) (OT POC. RN present and assisting during session.)  Activity Tolerance: Patient tolerated treatment well Patient left: in bed;with call bell/phone within reach;with nursing/sitter in room;with family/visitor present  OT Visit Diagnosis: Unsteadiness on feet (R26.81);Other abnormalities of gait and mobility (R26.89);Muscle weakness (generalized) (M62.81)                Time: 8679-8652 OT Time Calculation (min): 27 min Charges:  OT General Charges $OT Visit: 1 Visit OT Evaluation $OT Eval Moderate Complexity: 1 Mod OT Treatments $Self Care/Home Management : 8-22 mins  Margarie Rockey HERO., OTR/L, MA Acute Rehab [phone]   Margarie FORBES Horns 10/06/2024, 2:12 PM

## 2024-10-06 NOTE — Progress Notes (Signed)
 Speech Language Pathology Treatment: Dysphagia  Patient Details Name: JUNO BOZARD MRN: [id number] DOB: 05-07-1951 Today's Date: 10/06/2024 Time: 8692-8684 SLP Time Calculation (min) (ACUTE ONLY): 8 min  Assessment / Plan / Recommendation Clinical Impression  Pt observed to have some coughing and throat clearing with sips of juice that were not observed yesterday. Pts intake has been limited. Wife concerned about prolonged mastication. Pt says food doesn't taste good. WIll f/u with FEES tomorrow to ensure adequate airway protection with liquids. Expect return to adequate intake of solids to take several days.    HPI HPI: Pt is a 73 year old male admitted with ARDS due to aspiration pna after witnessed emesis during a routine colonoscopy. CXR was done and showed pneumonia in the left lung and possible pleural effusion. Patient then placed on BiPAP and HFNC, however remained dyspneic and tachypneic. Pt intubated 11/24-12/1. No prior history of dysphagia or esophageal dysphagia in chart.      SLP Plan  FEES        Swallow Evaluation Recommendations   Recommendations: PO diet PO Diet Recommendation: Dysphagia 3 (Mechanical soft);Thin liquids (Level 0) Liquid Administration via: Cup;Straw Medication Administration: Whole meds with liquid Supervision: Patient able to self-feed Postural changes: Position pt fully upright for meals Oral care recommendations: Oral care BID (2x/day)     Recommendations                     Oral care QID     Dysphagia, unspecified (R13.10)     FEES     Maury Groninger, Consuelo Fitch  10/06/2024, 3:05 PM

## 2024-10-07 DIAGNOSIS — J80 Acute respiratory distress syndrome: Secondary | ICD-10-CM | POA: Diagnosis not present

## 2024-10-07 LAB — COMPREHENSIVE METABOLIC PANEL WITH GFR
ALT: 72 U/L — ABNORMAL HIGH (ref 0–44)
AST: 55 U/L — ABNORMAL HIGH (ref 15–41)
Albumin: 2 g/dL — ABNORMAL LOW (ref 3.5–5.0)
Alkaline Phosphatase: 68 U/L (ref 38–126)
Anion gap: 10 (ref 5–15)
BUN: 37 mg/dL — ABNORMAL HIGH (ref 8–23)
CO2: 24 mmol/L (ref 22–32)
Calcium: 8.6 mg/dL — ABNORMAL LOW (ref 8.9–10.3)
Chloride: 100 mmol/L (ref 98–111)
Creatinine, Ser: 0.86 mg/dL (ref 0.61–1.24)
GFR, Estimated: >60 mL/min (ref >60)
Glucose, Bld: 104 mg/dL — ABNORMAL HIGH (ref 70–99)
Potassium: 5.2 mmol/L — ABNORMAL HIGH (ref 3.5–5.1)
Sodium: 134 mmol/L — ABNORMAL LOW (ref 135–145)
Total Bilirubin: 1 mg/dL (ref 0.0–1.2)
Total Protein: 5.5 g/dL — ABNORMAL LOW (ref 6.5–8.1)

## 2024-10-07 LAB — CBC
HCT: 39.8 % (ref 39.0–52.0)
Hemoglobin: 13.1 g/dL (ref 13.0–17.0)
MCH: 28.1 pg (ref 26.0–34.0)
MCHC: 32.9 g/dL (ref 30.0–36.0)
MCV: 85.2 fL (ref 80.0–100.0)
Platelets: 264 K/uL (ref 150–400)
RBC: 4.67 MIL/uL (ref 4.22–5.81)
RDW: 14.3 % (ref 11.5–15.5)
WBC: 13.8 K/uL — ABNORMAL HIGH (ref 4.0–10.5)
nRBC: 0.1 % (ref 0.0–0.2)

## 2024-10-07 LAB — MAGNESIUM: Magnesium: 2 mg/dL (ref 1.7–2.4)

## 2024-10-07 LAB — GLUCOSE, CAPILLARY
Glucose-Capillary: 154 mg/dL — ABNORMAL HIGH (ref 70–99)
Glucose-Capillary: 98 mg/dL (ref 70–99)
Glucose-Capillary: 152 mg/dL — ABNORMAL HIGH (ref 70–99)
Glucose-Capillary: 136 mg/dL — ABNORMAL HIGH (ref 70–99)
Glucose-Capillary: 137 mg/dL — ABNORMAL HIGH (ref 70–99)

## 2024-10-07 MED ORDER — SODIUM ZIRCONIUM CYCLOSILICATE 10 G PO PACK
10.0000 g | PACK | Freq: Once | ORAL | Status: AC
  Administered 2024-10-07: 10 g via ORAL
  Filled 2024-10-07: qty 1

## 2024-10-07 NOTE — Care Management Important Message (Signed)
 Important Message  Patient Details  Name: Bryce Martinez MRN: [id number] Date of Birth: 1951/04/01   Important Message Given:  Yes - Medicare IM     Claretta Deed 10/07/2024, 3:32 PM

## 2024-10-07 NOTE — Progress Notes (Addendum)
 PROGRESS NOTE  Bryce Martinez FMW:[id number] DOB: [birth year] DOA: 09/27/2024 PCP: Antonetta Rollene BRAVO, MD   LOS: 10 days   Brief Narrative / Interim history: 73 year old male with history of HTN, HLD, hep C status posttreatment, advanced fibrosis was initially seen at [facility] on 11/24 for routine screening colonoscopy, he apparently aspirated during the procedure, monitored and discharged home on room air.  At home she has had worsening respiratory symptoms, was brought back requiring 15 L, respiratory status got worse and eventually ended up being intubated.  He developed presumed ARDS due to aspiration.  Incidentally he was found to be COVID-positive as well.  Eventually extubated, and transferred to the hospitalist service 12/3  Significant events: 11/24 -Colonoscopy at [facility] with Dr. Eartha Flavors, postop O2 requirements showing 93% on RA at discharge. 11/24- Trialed 15L NRB, BiPAP without respiratory improvement. PCCM consulted for admission and worsening respiratory status,  intubated in [facility].  11/24- Left radial arterial line placed  11/24- 2030, CVC was placed for increased Levophed  requirements 11/28- Failed SBT, will switch to Precedex for sedation to improve respiratory drive 87/8- Successfully extubated to 3L [place] 12/4 room air  Subjective / 24h Interval events: He is doing well, has been starting to eat a little bit more and had half a yogurt this morning.  Assesement and Plan: Principal problem Acute hypoxic respiratory failure due to ARDS from aspiration pneumonia -requiring intubation initially.  He is status post 5 days of antibiotics, now completed.  He was eventually extubated on 12/1, respiratory status much better, was on 3 L yesterday but now he is on room air - PT recommends CIR, I think he is an appropriate candidate, appreciate input - Continue to work with speech, he is eating more, discontinue core track when appropriate and dissipate over  the next few days  Active problems Essential hypertension-continue losartan , blood pressure stable  Acute systolic CHF -thought to be due to septic cardiomyopathy, shock.  2D echocardiogram showed LVEF 25 to 30%, global hypokinesis.  RV systolic function was normal.  He is on losartan , continue.  Anticipate recovery, will need repeat echo in 3 to 4 weeks as an outpatient  COVID-positive-likely incidental, his ARDS was due to aspiration given timing postanesthesia, never had any fever, chills, flulike illness so I wonder whether this is an older infection and the PCR was positive still.  Discontinue precautions today  History of hep C, advanced fibrosis -outpatient follow-up with GI  AKI-creatinine improved, creatinine is 0.8 this morning  Hyperkalemia -potassium slightly up to 5.2, Lokelma x 1  DM2 -continue sliding scale  Deconditioning -in bed for the past 8 days due to intubation, ARDS, shock.  PT/OT recommending CIR, evaluation pending  Scheduled Meds:  Chlorhexidine  Gluconate Cloth  6 each Topical Daily   docusate  100 mg Per Tube BID   feeding supplement (PROSource TF20)  60 mL Per Tube Daily   guaiFENesin  10 mL Per Tube BID   heparin   5,000 Units Subcutaneous Q8H   insulin  aspart  0-20 Units Subcutaneous Q4H   insulin  glargine-yfgn  8 Units Subcutaneous Daily   lidocaine   1 patch Transdermal Daily   losartan   100 mg Per Tube Daily   mouth rinse  15 mL Mouth Rinse TID   senna  1 tablet Per Tube QHS   Continuous Infusions:  feeding supplement (OSMOLITE 1.5 CAL) 1,000 mL (10/06/24 1958)   PRN Meds:.acetaminophen , hydrOXYzine, mouth rinse  Current Outpatient Medications  Medication Instructions  losartan  (COZAAR ) 100 mg, Oral, Daily   meclizine  (ANTIVERT ) 25 mg, Oral, 2 times daily PRN   Misc. Devices MISC Blood pressure cuff/device - 1. ICD10: I10   spironolactone  (ALDACTONE ) 25 mg, Oral, Daily    Diet Orders (From admission, onward)     Start     Ordered    10/05/24 1256  DIET DYS 3 Room service appropriate? Yes with Assist; Fluid consistency: Thin  Diet effective now       Question Answer Comment  Room service appropriate? Yes with Assist   Fluid consistency: Thin      10/05/24 1255            DVT prophylaxis: heparin  injection 5,000 Units Start: 09/27/24 1630   Lab Results  Component Value Date   PLT 264 10/07/2024      Code Status: Full Code  Family Communication: Wife at bedside  Status is: Inpatient Remains inpatient appropriate because: Severity of illness   Level of care: Telemetry  Consultants:  PCCM  Objective: Vitals:   10/06/24 2359 10/07/24 0417 10/07/24 0500 10/07/24 0801  BP: (!) 114/59 135/66  133/63  Pulse: 66 66  (!) 58  Resp:    18  Temp: 98 F (36.7 C) 97.6 F (36.4 C)  98.1 F (36.7 C)  TempSrc:      SpO2: 93% 97%  95%  Weight:   74 kg   Height:        Intake/Output Summary (Last 24 hours) at 10/07/2024 1054 Last data filed at 10/06/2024 2150 Gross per 24 hour  Intake 325 ml  Output 250 ml  Net 75 ml   Wt Readings from Last 3 Encounters:  10/07/24 74 kg  09/27/24 81.2 kg  08/24/24 79.2 kg    Examination:  Constitutional: NAD Eyes: lids and conjunctivae normal, no scleral icterus ENMT: mmm Neck: normal, supple Respiratory: clear to auscultation bilaterally, no wheezing, no crackles. Normal respiratory effort.  Cardiovascular: Regular rate and rhythm, no murmurs / rubs / gallops. No LE edema. Abdomen: soft, no distention, no tenderness. Bowel sounds positive.   Data Reviewed: I have independently reviewed following labs and imaging studies   CBC Recent Labs  Lab 10/02/24 0500 10/03/24 0539 10/04/24 0500 10/05/24 0420 10/07/24 0846  WBC 29.0* 26.3* 22.4* 17.1* 13.8*  HGB 11.2* 11.3* 11.7* 12.2* 13.1  HCT 33.1* 34.2* 36.4* 37.6* 39.8  PLT 115* 137* 197 251 264  MCV 83.8 84.7 86.1 86.2 85.2  MCH 28.4 28.0 27.7 28.0 28.1  MCHC 33.8 33.0 32.1 32.4 32.9  RDW 13.7 13.7  14.0 14.2 14.3    Recent Labs  Lab 10/01/24 0415 10/01/24 0417 10/02/24 0500 10/03/24 0539 10/04/24 0500 10/04/24 0950 10/05/24 0420 10/07/24 0846  NA 138   < > 142 141 146*  --  147* 134*  K 3.8   < > 3.9 4.2 4.3  --  4.0 5.2*  CL 102  --  105 103 106  --  109 100  CO2 25  --  27 29 30   --  29 24  GLUCOSE 154*  --  196* 168* 162*  --  143* 104*  BUN 38*  --  50* 52* 53*  --  53* 37*  CREATININE 1.24  --  1.23 1.14 1.27*  --  1.05 0.86  CALCIUM  8.4*  --  8.2* 8.2* 8.5*  --  8.7* 8.6*  AST 35  --  39 34  --   --   --  55*  ALT  26  --  31 29  --   --   --  72*  ALKPHOS 76  --  88 92  --   --   --  68  BILITOT 0.9  --  0.6 0.6  --   --   --  1.0  ALBUMIN 2.0*  --  2.2* 2.2*  --   --   --  2.0*  MG 2.7*  --  2.3 2.4  --   --  2.7* 2.0  PROCALCITON  --   --   --   --   --  12.58  --   --   LATICACIDVEN 2.2*  --  1.8  --   --   --   --   --    < > = values in this interval not displayed.    ------------------------------------------------------------------------------------------------------------------ No results for input(s): CHOL, HDL, LDLCALC, TRIG, CHOLHDL, LDLDIRECT in the last 72 hours.  Lab Results  Component Value Date   HGBA1C 5.5 09/16/2017   ------------------------------------------------------------------------------------------------------------------ No results for input(s): TSH, T4TOTAL, T3FREE, THYROIDAB in the last 72 hours.  Invalid input(s): FREET3  Cardiac Enzymes No results for input(s): CKMB, TROPONINI, MYOGLOBIN in the last 168 hours.  Invalid input(s): CK ------------------------------------------------------------------------------------------------------------------    Component Value Date/Time   BNP 251.0 (H) 09/28/2024 0238    CBG: Recent Labs  Lab 10/06/24 1556 10/06/24 2026 10/06/24 2358 10/07/24 0417 10/07/24 0802  GLUCAP 194* 141* 146* 154* 98    Recent Results (from the past 240 hours)   Culture, blood (routine x 2)     Status: None   Collection Time: 09/27/24 11:06 AM   Specimen: BLOOD  Result Value Ref Range Status   Specimen Description BLOOD BLOOD RIGHT ARM FOREARM  Final   Special Requests   Final    BOTTLES DRAWN AEROBIC AND ANAEROBIC Blood Culture results may not be optimal due to an inadequate volume of blood received in culture bottles   Culture   Final    NO GROWTH 5 DAYS Performed at [facility], [address], [place], [place] [address]    Report Status 10/02/2024 FINAL  Final  Culture, blood (routine x 2)     Status: None   Collection Time: 09/27/24 11:06 AM   Specimen: BLOOD  Result Value Ref Range Status   Specimen Description BLOOD BLOOD RIGHT ARM  Final   Special Requests   Final    BOTTLES DRAWN AEROBIC AND ANAEROBIC Blood Culture results may not be optimal due to an inadequate volume of blood received in culture bottles   Culture   Final    NO GROWTH 5 DAYS Performed at [facility], [address], [place], [place] [address]    Report Status 10/02/2024 FINAL  Final  MRSA Next Gen by PCR, Nasal     Status: None   Collection Time: 09/27/24  4:20 PM   Specimen: Nasal Mucosa; Nasal Swab  Result Value Ref Range Status   MRSA by PCR Next Gen NOT DETECTED NOT DETECTED Final    Comment: (NOTE) The GeneXpert MRSA Assay (FDA approved for NASAL specimens only), is one component of a comprehensive MRSA colonization surveillance program. It is not intended to diagnose MRSA infection nor to guide or monitor treatment for MRSA infections. Test performance is not FDA approved in patients less than 65 years old. Performed at [facility], [address], [place], [place] [address]   Resp panel by RT-PCR (RSV, Flu A&B, Covid) Anterior Nasal  Swab     Status: Abnormal   Collection Time: 09/27/24  4:33 PM   Specimen: Anterior Nasal Swab  Result Value Ref Range Status   SARS Coronavirus 2 by RT PCR POSITIVE (A) NEGATIVE Final   Influenza A by PCR  NEGATIVE NEGATIVE Final   Influenza B by PCR NEGATIVE NEGATIVE Final    Comment: (NOTE) The Xpert Xpress SARS-CoV-2/FLU/RSV plus assay is intended as an aid in the diagnosis of influenza from Nasopharyngeal swab specimens and should not be used as a sole basis for treatment. Nasal washings and aspirates are unacceptable for Xpert Xpress SARS-CoV-2/FLU/RSV testing.  Fact Sheet for Patients: bloggercourse.com  Fact Sheet for Healthcare Providers: seriousbroker.it  This test is not yet approved or cleared by the [place]  FDA and has been authorized for detection and/or diagnosis of SARS-CoV-2 by FDA under an Emergency Use Authorization (EUA). This EUA will remain in effect (meaning this test can be used) for the duration of the COVID-19 declaration under Section 564(b)(1) of the Act, 21 U.S.C. section 360bbb-3(b)(1), unless the authorization is terminated or revoked.     Resp Syncytial Virus by PCR NEGATIVE NEGATIVE Final    Comment: (NOTE) Fact Sheet for Patients: bloggercourse.com  Fact Sheet for Healthcare Providers: seriousbroker.it  This test is not yet approved or cleared by the [place]  FDA and has been authorized for detection and/or diagnosis of SARS-CoV-2 by FDA under an Emergency Use Authorization (EUA). This EUA will remain in effect (meaning this test can be used) for the duration of the COVID-19 declaration under Section 564(b)(1) of the Act, 21 U.S.C. section 360bbb-3(b)(1), unless the authorization is terminated or revoked.  Performed at [facility], [address] [place], [place] [address]   Culture, Respiratory w Gram Stain     Status: None   Collection Time: 09/27/24  4:34 PM   Specimen: Tracheal Aspirate; Respiratory  Result Value Ref Range Status   Specimen Description TRACHEAL ASPIRATE  Final   Special Requests Normal  Final   Gram  Stain   Final    RARE WBC SEEN RARE GRAM NEGATIVE RODS Performed at [facility], [address], [place], [place] [address]    Culture FEW ESCHERICHIA COLI  Final   Report Status 09/30/2024 FINAL  Final   Organism ID, Bacteria ESCHERICHIA COLI  Final      Susceptibility   Escherichia coli - MIC*    AMPICILLIN  >=32 RESISTANT Resistant     CEFAZOLIN (NON-URINE) 16 RESISTANT Resistant     CEFEPIME <=0.12 SENSITIVE Sensitive     ERTAPENEM <=0.12 SENSITIVE Sensitive     CEFTRIAXONE  0.5 SENSITIVE Sensitive     CIPROFLOXACIN  <=0.06 SENSITIVE Sensitive     GENTAMICIN <=1 SENSITIVE Sensitive     MEROPENEM <=0.25 SENSITIVE Sensitive     TRIMETH /SULFA  <=20 SENSITIVE Sensitive     AMPICILLIN /SULBACTAM 16 INTERMEDIATE Intermediate     PIP/TAZO Value in next row Sensitive      <=4 SENSITIVEThis is a modified FDA-approved test that has been validated and its performance characteristics determined by the reporting laboratory.  This laboratory is certified under the Clinical Laboratory Improvement Amendments CLIA as qualified to perform high complexity clinical laboratory testing.    * FEW ESCHERICHIA COLI     Radiology Studies: No results found.   Nilda Fendt, MD, PhD [facility]  Between 7 am - 7 pm I am available, please contact me via Amion (for emergencies) or Securechat (non urgent messages)  Between 7 pm -  7 am I am not available, please contact night coverage MD/APP via Amion

## 2024-10-07 NOTE — PMR Pre-admission (Signed)
 PMR Admission Coordinator Pre-Admission Assessment  Patient: Bryce Martinez is an 73 y.o., male MRN: [id number] DOB: 12/22/1950 Height: 6' 1 (185.4 cm) Weight: 72 kg  Insurance Information HMO:     PPO: yes     PCP:      IPA:      80/20:      OTHER:  PRIMARY: [facility]      Policy#: [id number]     Subscriber:  CM Name:  email approval Phone#:      Fax#: [email or web address] Pre-Cert#: [id number] auth for CIR from email with [facility] for admit 12/9 with next review date tbd.  Updates due to email listed above.       Employer:  Benefits:  Phone #: [phone]     Name:  Eff. Date: 03/28/2018     Deduct:     Out of Pocket Max:     Life Max:  CIR:  100%     SNF:  Outpatient: Co-Pay:  Home Health:     Co-Pay:  DME:     Co-Pay: Providers:  SECONDARY: U[employer] Medicare      Policy#:      Phone#:   Artist:       Phone#:   The Data Processing Manager" for patients in Inpatient Rehabilitation Facilities with attached "Privacy Act Statement-Health Care Records" was provided and verbally reviewed with: Patient and Family  Emergency Contact Information Contact Information     Name Relation Home Work Mobile   [place] Spouse [phone]  [phone]      Other Contacts   None on File     Current Medical History  Patient Admitting Diagnosis: critical illness myopathy  History of Present Illness: Pt is a 73 y/o male with PMH of hep C with advanced fibrosis, HTN, DM, remote tobacco use, who was admitted to [facility] on 11/24 with ARDS.  Pt had a routine colonoscopy on 11/24 and post-procedure O2 saturation 93% on RA but he was discharged home.  Procedure note indicate routine uncomplicated procedure.  He presented to the ED at [facility] with ongoing shortness of breath, abdominal pain and back pain.  Pt endorses vomiting during the colonoscopy.  His O2 saturation on RA at [facility] was 88%, ABG showed acidoses and pO2 of 59.  Covid positive.  He initially was on NRB, then bipap  with no improvement and ultimately required intubation before he was transferred to [facility] on 09/27/24.  He did require levophed  and precedex .  On 12/1 he was successfully extubated to 3L [place] and is currently on roomair.  He has completed 5 day course of antibiotics.  Cardiac workup due to possible septic cardiomyopathy showed LVEF 25-30% with global hypokinesis.  Cardiology anticipated recovery and f/u outpatient repeat echo in 3-4 weeks.  Therapy ongoing and pt demonstrates profound proximal>distal weakness, consistent with critical illness myopathy.  Recommendations for CIR.      Patient's medical record from [facility] has been reviewed by the rehabilitation admission coordinator and physician.  Past Medical History  Past Medical History:  Diagnosis Date   Allergy    Arthritis    ED (erectile dysfunction)    GERD (gastroesophageal reflux disease)    Occasionally   Hepatitis C    Hyperlipidemia    Hypertension     Has the patient had major surgery during 100 days prior to admission? No  Family History   family history includes Cancer in his father; Diabetes in his brother; Drug abuse in  his brother; Hypertension in his brother, daughter, mother, sister, and son.  Current Medications  Current Facility-Administered Medications:    acetaminophen  (TYLENOL ) tablet 650 mg, 650 mg, Oral, Q6H PRN, Laron Agent, RPH, 650 mg at 10/11/24 0506   calcium  carbonate (TUMS - dosed in mg elemental calcium ) chewable tablet 200 mg of elemental calcium , 1 tablet, Oral, Q8H PRN, Gherghe, Costin M, MD   Chlorhexidine  Gluconate Cloth 2 % PADS 6 each, 6 each, Topical, Daily, Desai, Nikita S, MD, 6 each at 10/07/24 2039   feeding supplement (ENSURE PLUS HIGH PROTEIN) liquid 237 mL, 237 mL, Oral, BID BM, Gherghe, Costin M, MD, 237 mL at 10/10/24 1530   guaiFENesin  (ROBITUSSIN) 100 MG/5ML liquid 10 mL, 10 mL, Oral, BID, Gherghe, Costin M, MD, 10 mL at 10/11/24 0900   heparin  injection 5,000  Units, 5,000 Units, Subcutaneous, Q8H, Tawkaliyar, Roya, DO, 5,000 Units at 10/11/24 0506   hydrOXYzine  (ATARAX ) tablet 10 mg, 10 mg, Oral, TID PRN, Laron Agent, RPH   insulin  aspart (novoLOG ) injection 0-20 Units, 0-20 Units, Subcutaneous, Q4H, Amilibia, Jaden, DO, 3 Units at 10/10/24 2136   insulin  glargine-yfgn (SEMGLEE ) injection 8 Units, 8 Units, Subcutaneous, Daily, Amilibia, Jaden, DO, 8 Units at 10/11/24 0935   lidocaine  (LIDODERM ) 5 % 1 patch, 1 patch, Transdermal, Daily, Amilibia, Jaden, DO, 1 patch at 10/11/24 0900   losartan  (COZAAR ) tablet 100 mg, 100 mg, Oral, Daily, Laron Agent, RPH, 100 mg at 10/11/24 0900   melatonin tablet 3 mg, 3 mg, Oral, QHS PRN, Opyd, Timothy S, MD, 3 mg at 10/10/24 2143   menthol  (CEPACOL) lozenge 3 mg, 1 lozenge, Oral, PRN, Opyd, Timothy S, MD, 3 mg at 10/09/24 2137   Oral care mouth rinse, 15 mL, Mouth Rinse, PRN, Desai, Nikita S, MD   Oral care mouth rinse, 15 mL, Mouth Rinse, TID, Baral, Dipti, MD, 15 mL at 10/11/24 0901  Patients Current Diet:  Diet Order             DIET DYS 3 Room service appropriate? Yes with Assist; Fluid consistency: Thin  Diet effective now                   Precautions / Restrictions Precautions Precautions: Fall, Other (comment) Precaution/Restrictions Comments: coretrak Restrictions Weight Bearing Restrictions Per Provider Order: No   Has the patient had 2 or more falls or a fall with injury in the past year? No  Prior Activity Level Community (5-7x/wk): independent prior to admit, no DME, retired but still works part time, driving  Prior Functional Level Self Care: Did the patient need help bathing, dressing, using the toilet or eating? Independent  Indoor Mobility: Did the patient need assistance with walking from room to room (with or without device)? Independent  Stairs: Did the patient need assistance with internal or external stairs (with or without device)? Independent  Functional Cognition:  Did the patient need help planning regular tasks such as shopping or remembering to take medications? Independent  Patient Information Are you of Hispanic, Latino/a,or Spanish origin?: A. No, not of Hispanic, Latino/a, or Spanish origin What is your race?: B. Black or African American Do you need or want an interpreter to communicate with a doctor or health care staff?: 0. No  Patient's Response To:  Health Literacy and Transportation Is the patient able to respond to health literacy and transportation needs?: Yes Health Literacy - How often do you need to have someone help you when you read instructions, pamphlets, or other written material  from your doctor or pharmacy?: Never In the past 12 months, has lack of transportation kept you from medical appointments or from getting medications?: No In the past 12 months, has lack of transportation kept you from meetings, work, or from getting things needed for daily living?: No  Home Assistive Devices / Equipment Home Equipment: None  Prior Device Use: Indicate devices/aids used by the patient prior to current illness, exacerbation or injury? None of the above  Current Functional Level Cognition  Orientation Level: Oriented X4    Extremity Assessment (includes Sensation/Coordination)  Upper Extremity Assessment: Left hand dominant, Generalized weakness, RUE deficits/detail, LUE deficits/detail RUE Deficits / Details: hx of rotator cuff injury with repair several years ago with mildly decreased shoulder AROM at baseline secondary to this; AROM otherwise WFL; generalized weakness; decreased coordination but with noted improvements as compared to OT eval on 12/3 RUE Sensation: WNL RUE Coordination: decreased gross motor, decreased fine motor LUE Deficits / Details: AROM shoulder flexion to approximately 85 degrees and AAROM WFL; AROM otherwise WFL; generalized weakness; decreased coordination but with noted improvements since OT eval on  12/3 LUE Sensation: WNL LUE Coordination: decreased fine motor, decreased gross motor  Lower Extremity Assessment: Defer to PT evaluation    ADLs  Overall ADL's : Needs assistance/impaired Eating/Feeding: Set up, Minimal assistance, Sitting Grooming: Minimal assistance, Moderate assistance, Sitting Upper Body Bathing: Moderate assistance, Maximal assistance, Sitting Lower Body Bathing: Total assistance, +2 for physical assistance, +2 for safety/equipment, Sit to/from stand Upper Body Dressing : Contact guard assist, Minimal assistance, Sitting Lower Body Dressing: Maximal assistance, +2 for safety/equipment, Sit to/from stand, Cueing for compensatory techniques Toilet Transfer: Minimal assistance, +2 for safety/equipment, Ambulation, BSC/3in1, Rolling walker (2 wheels) Toilet Transfer Details (indicate cue type and reason): simulated bed to chair Toileting- Clothing Manipulation and Hygiene: Set up, Contact guard assist, Bed level, Total assistance, +2 for physical assistance, +2 for safety/equipment, Sit to/from stand Toileting - Clothing Manipulation Details (indicate cue type and reason): Set up to CGA for use of handheld urinal; Total assist +2 for pericare in stit/stand Functional mobility during ADLs: Minimal assistance, +2 for safety/equipment, Rolling walker (2 wheels) (with pt requiring frequent seated rest breaks) General ADL Comments: Pt with decreased activity tolerance, fatiguing quickly during tasks. Pt with noted improvements in motor planning during functional tasks this session, but continuing to require mildly increased time for tasks.    Mobility  Overal bed mobility: Needs Assistance Bed Mobility: Sit to Supine Supine to sit: HOB elevated, Used rails, Mod assist Sit to supine: Mod assist General bed mobility comments: assist to manage LE off bed, pt able to push up to seated,    Transfers  Overall transfer level: Needs assistance Equipment used: Rolling walker (2  wheels) Transfers: Sit to/from Stand, Bed to chair/wheelchair/BSC Sit to Stand: Min assist, +2 safety/equipment, From elevated surface Bed to/from chair/wheelchair/BSC transfer type:: Step pivot Squat pivot transfers: Max assist, +2 safety/equipment, +2 physical assistance Step pivot transfers: Min assist, +2 safety/equipment, +2 physical assistance Transfer via Lift Equipment: Stedy General transfer comment: minAx2 for power up from elevated bed surface and heavy minAx2 for power up from low recliner surface to come to standing in RW    Ambulation / Gait / Stairs / Wheelchair Mobility  Ambulation/Gait Ambulation/Gait assistance: Min assist, +2 safety/equipment Gait Distance (Feet): 12 Feet (+10) Assistive device: Rolling walker (2 wheels) Gait Pattern/deviations: Step-to pattern, Trunk flexed, Narrow base of support General Gait Details: min A for steadying, cues for upright posture,  looking up and out and widening BoS. Pt requires 1x seated rest break due to fatigue Gait velocity: slowed Gait velocity interpretation: <1.31 ft/sec, indicative of household ambulator    Posture / Balance Balance Overall balance assessment: Needs assistance Sitting-balance support: Bilateral upper extremity supported, Single extremity supported, Feet supported Sitting balance-Leahy Scale: Fair Postural control: Posterior lean (mild) Standing balance support: Bilateral upper extremity supported, During functional activity, Reliant on assistive device for balance Standing balance-Leahy Scale: Poor Standing balance comment: reliant on B UE support and assist of therapist    Special considerations/life events  Diabetic management yes   Previous Home Environment (from acute therapy documentation) Living Arrangements: Spouse/significant other Available Help at Discharge: Family, Available 24 hours/day Type of Home: House Home Layout: Laundry or work area in basement, One level Home Access: Stairs to  enter Entrance Stairs-Rails: Right, Left Entrance Stairs-Number of Steps: 3 Bathroom Shower/Tub: Engineer, Manufacturing Systems: Standard Home Care Services: No  Discharge Living Setting Plans for Discharge Living Setting: Patient's home, Lives with (comment) (spouse) Type of Home at Discharge: House Discharge Home Layout: One level Discharge Home Access: Stairs to enter Entrance Stairs-Rails: None Entrance Stairs-Number of Steps: 3 Discharge Bathroom Shower/Tub: Tub/shower unit Discharge Bathroom Toilet: Standard Discharge Bathroom Accessibility: Yes How Accessible: Accessible via walker Does the patient have any problems obtaining your medications?: No  Social/Family/Support Systems Patient Roles: Spouse Anticipated Caregiver: spouse, [name] Anticipated Industrial/product Designer Information: [phone] Ability/Limitations of Caregiver: none stated Caregiver Availability: 24/7 Discharge Plan Discussed with Primary Caregiver: Yes Is Caregiver In Agreement with Plan?: Yes  Goals Patient/Family Goal for Rehab: PT/OT min assist, SLP mod I Expected length of stay: 18-21 days Additional Information: Discharge plan: return to patients home, previous living environment, with spouse able to provide 24/7 care.  They also have a 43 y/o child living with them and family next door Pt/Family Agrees to Admission and willing to participate: Yes Program Orientation Provided & Reviewed with Pt/Caregiver Including Roles  & Responsibilities: Yes  Decrease burden of Care through IP rehab admission: n/a  Possible need for SNF placement upon discharge: Not anticipated.  Plan for discharge home with spouse to previous living environment.  Spouse can provide 24/7 supervision/assist and they have family next door as well.   Patient Condition: This patient's condition remains as documented in the consult dated 10/07/24, in which the Rehabilitation Physician determined and documented that the patient's  condition is appropriate for intensive rehabilitative care in an inpatient rehabilitation facility. Will admit to inpatient rehab today.  Preadmission Screen Completed By:  Reche FORBES Lowers, PT, DPT12/06/2024 12:53 PM ______________________________________________________________________   Discussed status with Dr. Shelvia Fojtik on 10/12/24  at 10:04 AM  and received approval for admission today.  Admission Coordinator:  Caitlin E Warren, PT, time 10:04 AM Pattricia 10/12/24    Assessment/Plan: Diagnosis: ICU myopathy Does the need for close, 24 hr/day Medical supervision in concert with the patient's rehab needs make it unreasonable for this patient to be served in a less intensive setting? Yes Co-Morbidities requiring supervision/potential complications: ARDS, HTN, Hx of Hep C with fibrosis; transaminitis; azotemia; hyperkalemia;  Due to bladder management, bowel management, safety, skin/wound care, disease management, medication administration, pain management, and patient education, does the patient require 24 hr/day rehab nursing? Yes Does the patient require coordinated care of a physician, rehab nurse, PT, OT, and SLP to address physical and functional deficits in the context of the above medical diagnosis(es)? Yes Addressing deficits in the following areas: balance, endurance, locomotion, strength, transferring,  bowel/bladder control, bathing, dressing, feeding, grooming, and toileting Can the patient actively participate in an intensive therapy program of at least 3 hrs of therapy 5 days a week? Yes The potential for patient to make measurable gains while on inpatient rehab is good Anticipated functional outcomes upon discharge from inpatient rehab: min assist PT, min assist OT, modified independent SLP Estimated rehab length of stay to reach the above functional goals is: 18-22 days Anticipated discharge destination: Home 10. Overall Rehab/Functional Prognosis: good   MD Signature:

## 2024-10-07 NOTE — Progress Notes (Signed)
 Inpatient Rehab Coordinator Note:  I met with patient and his spouse at bedside to discuss CIR recommendations and goals/expectations of CIR stay.  We reviewed 3 hrs/day of therapy, physician follow up, and average length of stay 2 weeks (dependent upon progress) with goals of min assist.  Spouse confirms she can provide expected level of assist at discharge.  We reviewed insurance auth process and I will start that today.   Reche Lowers, PT, DPT Admissions Coordinator [phone] 10/07/24 12:44 PM

## 2024-10-07 NOTE — Progress Notes (Signed)
 Physical Therapy Treatment Patient Details Name: Bryce Martinez MRN: [id number] DOB: Apr 24, 1951 Today's Date: 10/07/2024   History of Present Illness 73 yo male admitted 11/24 with ARDS due to aspiration PNA after witnessed emesis during routine colonscopy at [facility]. intubated 11/24-12/1  COVID (+) PMH: OA, GERD, Hep C with advanced fibrosis, HLD, HTN, R TSR DM2, smoker    PT Comments  Pt finishing up eating soup on entry, asks When can I get this tube out of my nose. Pt introduced to [place], and is willing to try. Pt continues to be limited by decreased strength and endurance. Requires modA for management of LE out of and back into bed. Pt is able to perform 5x STS in [place] with modA initially progressing to min A. D/c plans continue to remain appropriate at this time. PT will continue to follow acutely.     If plan is discharge home, recommend the following: Two people to help with walking and/or transfers;Two people to help with bathing/dressing/bathroom   Can travel by private vehicle      no  Equipment Recommendations  Other (comment) (TBD, pending progress)    Recommendations for Other Services Rehab consult     Precautions / Restrictions Precautions Precautions: Fall;Other (comment) Recall of Precautions/Restrictions: Intact Precaution/Restrictions Comments: coretrak; airborne precautions Restrictions Weight Bearing Restrictions Per Provider Order: No     Mobility  Bed Mobility Overal bed mobility: Needs Assistance Bed Mobility: Sit to Supine     Supine to sit: Mod assist, HOB elevated, Used rails Sit to supine: Mod assist   General bed mobility comments: assist to manage LE off bed, pt able to push up to seated, modA for returning LE to bed    Transfers Overall transfer level: Needs assistance   Transfers: Sit to/from Stand Sit to Stand: Min assist, Mod assist           General transfer comment: pt able to perform 5x STS in [place], with modA for first  stand and minA for subsequent stand. pt reports it feels good to stand up Transfer via Lift Equipment: Stedy  Ambulation/Gait               General Gait Details: unable         Balance Overall balance assessment: Needs assistance Sitting-balance support: Bilateral upper extremity supported, Single extremity supported, Feet supported Sitting balance-Leahy Scale: Fair   Postural control: Posterior lean (mild) Standing balance support: Bilateral upper extremity supported, During functional activity, Reliant on assistive device for balance Standing balance-Leahy Scale: Poor Standing balance comment: reliant on B UE support and assist of therapist                            Communication Communication Communication: Impaired Factors Affecting Communication: Reduced clarity of speech (soft voice)  Cognition Arousal: Alert Behavior During Therapy: Flat affect   PT - Cognitive impairments: No apparent impairments                         Following commands: Intact      Cueing Cueing Techniques: Verbal cues     General Comments General comments (skin integrity, edema, etc.): VSS on RA, Pt eating soup on entry and asks if the Cortrak can be removed now, wife present throughout      Pertinent Vitals/Pain Pain Assessment Pain Assessment: Faces Pain Location: back Pain Descriptors / Indicators: Grimacing, Guarding Pain Intervention(s): Limited activity within patient's  tolerance, Repositioned     PT Goals (current goals can now be found in the care plan section) Acute Rehab PT Goals Patient Stated Goal: home PT Goal Formulation: With patient/family Time For Goal Achievement: 10/20/24 Potential to Achieve Goals: Good Progress towards PT goals: Progressing toward goals    Frequency    Min 3X/week       AM-PAC PT 6 Clicks Mobility   Outcome Measure  Help needed turning from your back to your side while in a flat bed without using  bedrails?: A Lot Help needed moving from lying on your back to sitting on the side of a flat bed without using bedrails?: A Lot Help needed moving to and from a bed to a chair (including a wheelchair)?: A Lot Help needed standing up from a chair using your arms (e.g., wheelchair or bedside chair)?: A Lot Help needed to walk in hospital room?: Total Help needed climbing 3-5 steps with a railing? : Total 6 Click Score: 10    End of Session Equipment Utilized During Treatment: Gait belt Activity Tolerance: Patient tolerated treatment well Patient left: with call bell/phone within reach;with family/visitor present;in bed Nurse Communication: Mobility status PT Visit Diagnosis: Other abnormalities of gait and mobility (R26.89);Muscle weakness (generalized) (M62.81)     Time: 8251-8191 PT Time Calculation (min) (ACUTE ONLY): 20 min  Charges:    $Therapeutic Exercise: 8-22 mins PT General Charges $$ ACUTE PT VISIT: 1 Visit                     Kostantinos Tallman B. Fleeta Lapidus PT, DPT Acute Rehabilitation Services Please use secure chat or  Call Office [phone]    Almarie KATHEE Fleeta [facility] 10/07/2024, 6:21 PM

## 2024-10-07 NOTE — Consult Note (Signed)
 Physical Medicine and Rehabilitation Consult Reason for Consult:Rehab Referring Physician: Dr Trixie   HPI: Bryce Martinez is a 73 y.o. L handed male with hx of Advanced fibrosis due to Hep C, HTN, HLD, R RTC shoulder surgery, OA and GERD admitted after a routine colonoscopy where he aspirated/had emesis during said colonoscopy under anesthesia.  He very rapidly developed ARDS and required intubation from 11/24 to 12/1. Since extubation, he's still requiring Cortrak- since he's eating <25% of meals at most per pt and his wife.  He also was found to be COVID (+) on admission as well.   Pt also found to be in Acute sCHF secondary to Sepstic CMO and shock with EF as low as 25-30% with global hypokinesis.  Currently on RA.   Just passed FEES- now on regular diet  Having acut eon chronic back pain while in hospital- usually uses tylenol  and intermittent ESI's at home-   His WBC down to 17 from 32k and BUN 37 down from 53 His LFTs are elevated today to 55/72- last check was normal.   Also reports he's not sleeping well- wakes up regularly. SO really tired.    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      Review of Systems  Constitutional:  Positive for malaise/fatigue and weight loss.  HENT: Negative.    Eyes: Negative.   Respiratory:  Positive for cough and shortness of breath. Negative for sputum production.   Cardiovascular:  Negative for chest pain and leg swelling.  Gastrointestinal:  Positive for nausea. Negative for abdominal pain and constipation.       Lack of appetite- has cortrak as a result  Genitourinary: Negative.   Musculoskeletal:  Positive for back pain and myalgias.  Skin: Negative.   Neurological:  Positive for focal weakness and weakness.  Endo/Heme/Allergies: Negative.   Psychiatric/Behavioral:  The patient has insomnia.   All other systems reviewed  and are negative.  Past Medical History:  Diagnosis Date   Allergy    Arthritis    ED (erectile dysfunction)    GERD (gastroesophageal reflux disease)    Occasionally   Hepatitis C    Hyperlipidemia    Hypertension    Past Surgical History:  Procedure Laterality Date   APPENDECTOMY     CHOLECYSTECTOMY N/A 04/25/2014   Procedure: LAPAROSCOPIC CHOLECYSTECTOMY;  Surgeon: Oneil DELENA Budge, MD;  Location: [facility];  Service: General;  Laterality: N/A;   COLONOSCOPY N/A 09/01/2019   Procedure: COLONOSCOPY;  Surgeon: Golda Claudis PENNER, MD;  Location: [facility];  Service: Endoscopy;  Laterality: N/A;  930   COLONOSCOPY N/A 09/27/2024   Procedure: COLONOSCOPY;  Surgeon: Eartha Angelia Sieving, MD;  Location: [facility];  Service: Gastroenterology;  Laterality: N/A;  7:30am, ASA 1-2   HEMICOLECTOMY Right    lap hand assisted   POLYPECTOMY  09/01/2019   Procedure: POLYPECTOMY;  Surgeon: Golda Claudis PENNER, MD;  Location: [facility];  Service: Endoscopy;;  colon   right shoulder surgery Right 03/2005   rotator cuff repair   Family History  Problem Relation Age of Onset   Hypertension Mother    Cancer Father        colon    Hypertension Sister    Hypertension Brother    Diabetes Brother    Drug abuse Brother    Hypertension Daughter    Hypertension Son  Social History:  reports that he quit smoking about 52 years ago. His smoking use included cigarettes. He started smoking about 67 years ago. He has a 4.4 pack-year smoking history. He has been exposed to tobacco smoke. He has never used smokeless tobacco. He reports that he does not currently use alcohol. He reports that he does not use drugs. Allergies:  Allergies  Allergen Reactions   Amlodipine  Other (See Comments)    unknown   Medications Prior to Admission  Medication Sig Dispense Refill   spironolactone  (ALDACTONE ) 25 MG tablet TAKE 1 TABLET (25 MG TOTAL) BY MOUTH DAILY. 90 tablet 3   losartan  (COZAAR ) 100 MG tablet  TAKE 1 TABLET BY MOUTH EVERY DAY 90 tablet 1   meclizine  (ANTIVERT ) 25 MG tablet Take 1 tablet (25 mg total) by mouth 2 (two) times daily as needed for dizziness. 30 tablet 0   Misc. Devices MISC Blood pressure cuff/device - 1. ICD10: I10 1 each 0    Home: Home Living Family/patient expects to be discharged to:: Private residence Living Arrangements: Spouse/significant other Available Help at Discharge: Family, Available 24 hours/day Type of Home: House Home Access: Stairs to enter [employer] of Steps: 3 Entrance Stairs-Rails: Right, Left Home Layout: Laundry or work area in basement, One level Bathroom Shower/Tub: Engineer, Manufacturing Systems: Standard Home Equipment: None  Functional History: Prior Function Prior Level of Function : Independent/Modified Independent, Working/employed, Driving Mobility Comments: Independent without an AD. ADLs Comments: Independent with ADLs and IADLs. Drives. 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 Functional Status:  Mobility: Bed Mobility Overal bed mobility: Needs Assistance Bed Mobility: Sit to Supine Supine to sit: Mod assist, HOB elevated, Used rails Sit to supine: Max assist, +2 for physical assistance, +2 for safety/equipment, HOB elevated, Used rails General bed mobility comments: assist to manage trunk and to elevate B LE into bed Transfers Overall transfer level: Needs assistance Equipment used: Rolling walker (2 wheels) Transfers: Sit to/from Stand, Bed to chair/wheelchair/BSC Sit to Stand: Max assist, +2 physical assistance, +2 safety/equipment Bed to/from chair/wheelchair/BSC transfer type:: Squat pivot Squat pivot transfers: Max assist, +2 safety/equipment, +2 physical assistance General transfer comment: Pt requiring +2 assist to power up from sitting in the recliner; cues for technique and hand placement Ambulation/Gait General Gait Details: unable     ADL: ADL Overall ADL's : Needs assistance/impaired Eating/Feeding: Set up, Minimal assistance, Sitting Grooming: Minimal assistance, Moderate assistance, Sitting Upper Body Bathing: Moderate assistance, Maximal assistance, Sitting Lower Body Bathing: Total assistance, +2 for physical assistance, +2 for safety/equipment, Sit to/from stand Upper Body Dressing : Moderate assistance, Sitting (with increased time) Lower Body Dressing: Total assistance, +2 for physical assistance, +2 for safety/equipment, Sit to/from stand Toilet Transfer: Maximal assistance, Squat-pivot, Rolling walker (2 wheels), BSC/3in1 Toilet Transfer Details (indicate cue type and reason): simulated from recliner to bed Toileting- Clothing Manipulation and Hygiene: Set up, Contact guard assist, Bed level, Total assistance, +2 for physical assistance, +2 for safety/equipment, Sit to/from stand Toileting - Clothing Manipulation Details (indicate cue type and reason): Set up to CGA for use of handheld urinal; Total assist +2 for pericare in stit/stand Functional mobility during ADLs:  (deferred this session) General ADL Comments: Pt with decreased activity tolerance, fatiguing quickly during tasks. Pt also requiring increased time for motor planning and with funcitonal level affected by generalized weakness.  Cognition: Cognition Orientation Level: Oriented to person, Oriented to place, Oriented to time, Oriented to situation Cognition Arousal: Alert Behavior  During Therapy: Flat affect  Blood pressure 133/63, pulse (!) 58, temperature 98.1 F (36.7 C), resp. rate 18, height 6' 1 (1.854 m), weight 74 kg, SpO2 95%. Physical Exam Vitals and nursing note reviewed. Exam conducted with a chaperone present.  Constitutional:      Appearance: He is normal weight.     Comments: Pt appears ill- so tired; sitting up in bed; with wife at bedside; awake, but obviously sleepy- kept drifting off; and closing eyes; appropriate, NAD   HENT:     Head: Normocephalic and atraumatic.     Comments: Green dye on lips Lips dry Cortrak in place    Right Ear: External ear normal.     Left Ear: External ear normal.     Nose: Nose normal.     Comments: cortrak    Mouth/Throat:     Mouth: Mucous membranes are dry.     Pharynx: Oropharynx is clear.  Eyes:     General:        Right eye: No discharge.        Left eye: No discharge.     Extraocular Movements: Extraocular movements intact.  Cardiovascular:     Rate and Rhythm: Normal rate and regular rhythm.     Heart sounds: Normal heart sounds. No murmur heard.    No gallop.  Pulmonary:     Comments: Increased work of breathing when first walked in and after exam- decreased at bases slightly- coarse breath sounds /upper airway sounds Abdominal:     General: Bowel sounds are normal. There is no distension.     Palpations: Abdomen is soft.     Tenderness: There is no abdominal tenderness.  Musculoskeletal:     Cervical back: Neck supple.     Comments: RUE- deltoid 3+/5; biceps 3+ to 4-/5; Otherwise 4/5 throughout distally LUE- deltoid 2/5; Biceps 3+/5, otherwise 4/5 throughout distally RLE- HF 2- to 2/5; KE/KF 3+/5; and DF/PF 5-/5 LLE- same as RLE  Skin:    General: Skin is warm and dry.     Comments: B/L forearm IV's- look OK  Neurological:     Mental Status: He is alert.     Comments: Aox3 except very sleepy/tired Intact to light touch in all 4 extremities Absent DTRs in LE's and 1+ in Ue's B/L   Psychiatric:        Thought Content: Thought content normal.        Judgment: Judgment normal.     Comments: Tired/sleepy- but interactive     Results for orders placed or performed during the hospital encounter of 09/27/24 (from the past 24 hours)  Glucose, capillary     Status: Abnormal   Collection Time: 10/06/24  3:56 PM  Result Value Ref Range   Glucose-Capillary 194 (H) 70 - 99 mg/dL  Glucose, capillary     Status: Abnormal   Collection Time: 10/06/24  8:26  PM  Result Value Ref Range   Glucose-Capillary 141 (H) 70 - 99 mg/dL  Glucose, capillary     Status: Abnormal   Collection Time: 10/06/24 11:58 PM  Result Value Ref Range   Glucose-Capillary 146 (H) 70 - 99 mg/dL  Glucose, capillary     Status: Abnormal   Collection Time: 10/07/24  4:17 AM  Result Value Ref Range   Glucose-Capillary 154 (H) 70 - 99 mg/dL  Glucose, capillary     Status: None   Collection Time: 10/07/24  8:02 AM  Result Value Ref Range   Glucose-Capillary 98  70 - 99 mg/dL  Comprehensive metabolic panel with GFR     Status: Abnormal   Collection Time: 10/07/24  8:46 AM  Result Value Ref Range   Sodium 134 (L) 135 - 145 mmol/L   Potassium 5.2 (H) 3.5 - 5.1 mmol/L   Chloride 100 98 - 111 mmol/L   CO2 24 22 - 32 mmol/L   Glucose, Bld 104 (H) 70 - 99 mg/dL   BUN 37 (H) 8 - 23 mg/dL   Creatinine, Ser 9.13 0.61 - 1.24 mg/dL   Calcium  8.6 (L) 8.9 - 10.3 mg/dL   Total Protein 5.5 (L) 6.5 - 8.1 g/dL   Albumin 2.0 (L) 3.5 - 5.0 g/dL   AST 55 (H) 15 - 41 U/L   ALT 72 (H) 0 - 44 U/L   Alkaline Phosphatase 68 38 - 126 U/L   Total Bilirubin 1.0 0.0 - 1.2 mg/dL   GFR, Estimated >39 >39 mL/min   Anion gap 10 5 - 15  Magnesium      Status: None   Collection Time: 10/07/24  8:46 AM  Result Value Ref Range   Magnesium  2.0 1.7 - 2.4 mg/dL  CBC     Status: Abnormal   Collection Time: 10/07/24  8:46 AM  Result Value Ref Range   WBC 13.8 (H) 4.0 - 10.5 K/uL   RBC 4.67 4.22 - 5.81 MIL/uL   Hemoglobin 13.1 13.0 - 17.0 g/dL   HCT 60.1 60.9 - 47.9 %   MCV 85.2 80.0 - 100.0 fL   MCH 28.1 26.0 - 34.0 pg   MCHC 32.9 30.0 - 36.0 g/dL   RDW 85.6 88.4 - 84.4 %   Platelets 264 150 - 400 K/uL   nRBC 0.1 0.0 - 0.2 %  Glucose, capillary     Status: Abnormal   Collection Time: 10/07/24 11:57 AM  Result Value Ref Range   Glucose-Capillary 152 (H) 70 - 99 mg/dL   No results found.   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 Yes Co-Morbidities requiring supervision/potential complications: Acute sCHF with EF 25-30%; HTN, HLD; Hep C advanced fibrosis; OA, GERD, chronic low back pain- intermittent ESI's; ARDS, COVID (+)- didn't get COVID vaccine this year Due to bladder management, bowel management, safety, skin/wound care, disease management, medication administration, pain management, and patient education, does the patient require 24 hr/day rehab nursing? Yes Does the patient require coordinated care of a physician, rehab nurse, therapy disciplines of PT, OT, might need SLP for vocal intensity to address physical and functional deficits in the context of the above medical diagnosis(es)? Yes Addressing deficits in the following areas: balance, endurance, locomotion, strength, transferring, bowel/bladder control, bathing, dressing, feeding, grooming, toileting, and vocal intensity Can the patient actively participate in an intensive therapy program of at least 3 hrs of therapy per day at least 5 days per week? Yes The potential for patient to make measurable gains while on inpatient rehab is good Anticipated functional outcomes upon discharge from inpatient rehab are min assist  with PT, min assist with OT, modified independent with SLP. Estimated rehab length of stay to reach the above functional goals is: 2-3 weeks Anticipated discharge destination: Home Overall Rehab/Functional Prognosis: good  RECOMMENDATIONS: This patient's condition is appropriate for continued rehabilitative care in the following setting: CIR Patient has agreed to  participate in recommended program. Yes Note that insurance prior authorization may be required for reimbursement for recommended care.  Comment:  Patient meets criteria and is a great candidate-  for CIR_ due to ICU myopathy and  severe proximal weakness, which is obvious on MSK exam- causing him to be mod-max A of 2- will need a longer stay due to this dx, not debility-pt was at a very high level of function prior to admission- will need SLP for vocal intensity since voice so quiet.   Will d/w admissions coordinator Pt has low endurance and increased fatigue due to ICU myopathy but is maintaining his O2 sats on Room air.  Thank you for this consult.   Lynisha Osuch, MD 10/07/2024 '   I spent a total of  56  minutes on total care today- >50% coordination of care- due to  Review of chart and therapy notes ; d/w pt and wife about prior function, vs current function and exam; also documentation and d/w admissions coordinator.

## 2024-10-07 NOTE — TOC Progression Note (Signed)
 Transition of Care [facility]) - Progression Note    Patient Details  Name: Bryce Martinez MRN: [id number] Date of Birth: July 05, 1951  [facility] CM/SW Contact  Rosaline JONELLE Joe, RN Phone Number: 10/07/2024, 11:22 AM  Clinical Narrative:    CM spoke with Kaitlyn Warren, CM with CIR and she plans to meet with the patient for possible start of insurance for CIR placement in the next 1-2 days.   Expected Discharge Plan: IP Rehab Facility Barriers to Discharge: Continued Medical Work up, Air [employer] and Services   Discharge Planning Services: CM Consult   Living arrangements for the past 2 months: Single Family Home                                       Social Drivers of Health (SDOH) Interventions SDOH Screenings   Food Insecurity: No Food Insecurity (09/29/2024)  Housing: Low Risk  (09/29/2024)  Transportation Needs: No Transportation Needs (09/29/2024)  Utilities: Not At Risk (09/29/2024)  Alcohol Screen: Low Risk  (06/28/2024)  Depression (PHQ2-9): Low Risk  (08/24/2024)  Financial Resource Strain: Low Risk  (06/28/2024)  Physical Activity: Insufficiently Active (06/28/2024)  Social Connections: Moderately Integrated (09/29/2024)  Stress: No Stress Concern Present (06/28/2024)  Tobacco Use: Medium Risk (09/27/2024)  Health Literacy: Adequate Health Literacy (06/29/2024)    Readmission Risk Interventions     No data to display

## 2024-10-07 NOTE — Procedures (Signed)
 Objective Swallowing Evaluation: Type of Study: FEES-Fiberoptic Endoscopic Evaluation of Swallow   Patient Details  Name: Bryce Martinez MRN: [id number] Date of Birth: Jun 11, 1951  Today's Date: 10/07/2024 Time: SLP Start Time (ACUTE ONLY): 0950 -SLP Stop Time (ACUTE ONLY): 1017  SLP Time Calculation (min) (ACUTE ONLY): 27 min   Past Medical History:  Past Medical History:  Diagnosis Date   Allergy    Arthritis    ED (erectile dysfunction)    GERD (gastroesophageal reflux disease)    Occasionally   Hepatitis C    Hyperlipidemia    Hypertension    Past Surgical History:  Past Surgical History:  Procedure Laterality Date   APPENDECTOMY     CHOLECYSTECTOMY N/A 04/25/2014   Procedure: LAPAROSCOPIC CHOLECYSTECTOMY;  Surgeon: Oneil DELENA Budge, MD;  Location: [facility];  Service: General;  Laterality: N/A;   COLONOSCOPY N/A 09/01/2019   Procedure: COLONOSCOPY;  Surgeon: Golda Claudis PENNER, MD;  [facility];  Service: Endoscopy;  Laterality: N/A;  930   COLONOSCOPY N/A 09/27/2024   Procedure: COLONOSCOPY;  Surgeon: Eartha Angelia Sieving, MD;  [facility];  Service: Gastroenterology;  Laterality: N/A;  7:30am, ASA 1-2   HEMICOLECTOMY Right    lap hand assisted   POLYPECTOMY  09/01/2019   Procedure: POLYPECTOMY;  Surgeon: Golda Claudis PENNER, MD;  [facility];  Service: Endoscopy;;  colon   right shoulder surgery Right 03/2005   rotator cuff repair   HPI: Pt is a 73 year old male admitted with ARDS due to aspiration pna after witnessed emesis during a routine colonoscopy. CXR was done and showed pneumonia in the left lung and possible pleural effusion. Patient then placed on BiPAP and HFNC, however remained dyspneic and tachypneic. Pt intubated 11/24-12/1. No prior history of dysphagia or esophageal dysphagia in chart.   No data recorded   Assessment / Plan / Recommendation     10/07/2024   11:00 AM  Clinical Impressions  Clinical Impression Pt  demosntrates a mild oral and oropharyngeal dysphagia with no aspiration during FEES. Pt has prolonged mastication, trace pharyngeal residue, and bilateral excresence on vocal folds resulting in mild dysphonia. There is reduced epiglottic deflection and potential flash PAS 2 penetration with liquids during the swallow (difficult to visualize on FEES). Pt advised to choose soft moist solids, follow bites of food with sips of liquid. Pt and wife able to recieve education. No SLP f/u needed, continue Mechanical soft diet and thin liquids, ok to upgrade to regular/thin when pt desires it.  SLP Visit Diagnosis Dysphagia, unspecified (R13.10)  Attention and concentration deficit following --  Frontal lobe and executive function deficit following --  Impact on safety and function Risk for inadequate nutrition/hydration         10/07/2024   11:00 AM  Treatment Recommendations  Treatment Recommendations No treatment recommended at this time        10/04/2024    3:00 PM  Prognosis  Prognosis for improved oropharyngeal function Good  Barriers to Reach Goals --  Barriers/Prognosis Comment --    Swallow Evaluation Recommendations Recommendations: PO diet PO Diet Recommendation: Dysphagia 3 (Mechanical soft);Thin liquids (Level 0) Liquid Administration via: Cup;Straw Medication Administration: Whole meds with liquid Supervision: Patient able to self-feed Postural changes: Position pt fully upright for meals Oral care recommendations: Oral care BID (2x/day)          10/07/2024   11:00 AM  Other Recommendations  Recommended Consults --  Oral Care Recommendations Oral care  BID  Caregiver Recommendations --  Follow Up Recommendations --  Assistance recommended at discharge --  Functional Status Assessment --       10/04/2024    3:00 PM  Frequency and Duration   Speech Therapy Frequency (ACUTE ONLY) min 2x/week  Treatment Duration 2 weeks         10/07/2024   11:00 AM  Oral Phase  Oral  Phase Impaired  Oral - Pudding Teaspoon --  Oral - Pudding Cup --  Oral - Honey Teaspoon --  Oral - Honey Cup --  Oral - Nectar Teaspoon --  Oral - Nectar Cup --  Oral - Nectar Straw --  Oral - Thin Teaspoon NT  Oral - Thin Cup WFL  Oral - Thin Straw WFL  Oral - Puree WFL  Oral - Mech Soft Delayed oral transit  Oral - Regular --  Oral - Multi-Consistency --  Oral - Pill --  Oral Phase - Comment --       10/07/2024   11:00 AM  Pharyngeal Phase  Pharyngeal Phase Impaired  Pharyngeal- Pudding Teaspoon --  Pharyngeal --  Pharyngeal- Pudding Cup --  Pharyngeal --  Pharyngeal- Honey Teaspoon --  Pharyngeal --  Pharyngeal- Honey Cup --  Pharyngeal --  Pharyngeal- Nectar Teaspoon --  Pharyngeal --  Pharyngeal- Nectar Cup --  Pharyngeal --  Pharyngeal- Nectar Straw --  Pharyngeal --  Pharyngeal- Thin Teaspoon --  Pharyngeal --  Pharyngeal- Thin Cup Reduced epiglottic inversion;Reduced airway/laryngeal closure  Pharyngeal --  Pharyngeal- Thin Straw Reduced epiglottic inversion;Reduced airway/laryngeal closure  Pharyngeal --  Pharyngeal- Puree Reduced epiglottic inversion;Pharyngeal residue - valleculae;Lateral channel residue  Pharyngeal --  Pharyngeal- Mechanical Soft --  Pharyngeal --  Pharyngeal- Regular Lateral channel residue;Pharyngeal residue - valleculae;Reduced epiglottic inversion  Pharyngeal --  Pharyngeal- Multi-consistency --  Pharyngeal --  Pharyngeal- Pill --  Pharyngeal --  Pharyngeal Comment --         No data to display           Jasmina Gendron, Consuelo Fitch 10/07/2024, 2:16 PM

## 2024-10-07 NOTE — Plan of Care (Signed)
  Problem: Education: Goal: Knowledge of General Education information will improve Description: Including pain rating scale, medication(s)/side effects and non-pharmacologic comfort measures Outcome: Progressing   Problem: Clinical Measurements: Goal: Ability to maintain clinical measurements within normal limits will improve Outcome: Progressing Goal: Will remain free from infection Outcome: Progressing Goal: Diagnostic test results will improve Outcome: Progressing Goal: Respiratory complications will improve Outcome: Progressing   Problem: Coping: Goal: Level of anxiety will decrease Outcome: Progressing

## 2024-10-08 DIAGNOSIS — J80 Acute respiratory distress syndrome: Secondary | ICD-10-CM | POA: Diagnosis not present

## 2024-10-08 LAB — CBC
HCT: 36.6 % — ABNORMAL LOW (ref 39.0–52.0)
Hemoglobin: 12.1 g/dL — ABNORMAL LOW (ref 13.0–17.0)
MCH: 27.8 pg (ref 26.0–34.0)
MCHC: 33.1 g/dL (ref 30.0–36.0)
MCV: 83.9 fL (ref 80.0–100.0)
Platelets: 344 K/uL (ref 150–400)
RBC: 4.36 MIL/uL (ref 4.22–5.81)
RDW: 14.2 % (ref 11.5–15.5)
WBC: 15.2 K/uL — ABNORMAL HIGH (ref 4.0–10.5)
nRBC: 0.2 % (ref 0.0–0.2)

## 2024-10-08 LAB — COMPREHENSIVE METABOLIC PANEL WITH GFR
ALT: 72 U/L — ABNORMAL HIGH (ref 0–44)
AST: 44 U/L — ABNORMAL HIGH (ref 15–41)
Albumin: 2 g/dL — ABNORMAL LOW (ref 3.5–5.0)
Alkaline Phosphatase: 65 U/L (ref 38–126)
Anion gap: 7 (ref 5–15)
BUN: 32 mg/dL — ABNORMAL HIGH (ref 8–23)
CO2: 27 mmol/L (ref 22–32)
Calcium: 9.1 mg/dL (ref 8.9–10.3)
Chloride: 101 mmol/L (ref 98–111)
Creatinine, Ser: 0.9 mg/dL (ref 0.61–1.24)
GFR, Estimated: >60 mL/min (ref >60)
Glucose, Bld: 130 mg/dL — ABNORMAL HIGH (ref 70–99)
Potassium: 4.5 mmol/L (ref 3.5–5.1)
Sodium: 135 mmol/L (ref 135–145)
Total Bilirubin: 0.4 mg/dL (ref 0.0–1.2)
Total Protein: 6 g/dL — ABNORMAL LOW (ref 6.5–8.1)

## 2024-10-08 LAB — GLUCOSE, CAPILLARY
Glucose-Capillary: 120 mg/dL — ABNORMAL HIGH (ref 70–99)
Glucose-Capillary: 128 mg/dL — ABNORMAL HIGH (ref 70–99)
Glucose-Capillary: 138 mg/dL — ABNORMAL HIGH (ref 70–99)
Glucose-Capillary: 138 mg/dL — ABNORMAL HIGH (ref 70–99)
Glucose-Capillary: 146 mg/dL — ABNORMAL HIGH (ref 70–99)
Glucose-Capillary: 158 mg/dL — ABNORMAL HIGH (ref 70–99)

## 2024-10-08 LAB — MAGNESIUM: Magnesium: 1.9 mg/dL (ref 1.7–2.4)

## 2024-10-08 MED ORDER — ENSURE PLUS HIGH PROTEIN PO LIQD
237.0000 mL | Freq: Two times a day (BID) | ORAL | Status: DC
  Administered 2024-10-08 – 2024-10-11 (×4): 237 mL via ORAL

## 2024-10-08 MED ORDER — OSMOLITE 1.5 CAL PO LIQD
1000.0000 mL | ORAL | Status: DC
  Administered 2024-10-08 – 2024-10-09 (×3): 1000 mL
  Filled 2024-10-08 (×4): qty 1000

## 2024-10-08 NOTE — Progress Notes (Signed)
 Inpatient Rehab Admissions Coordinator:   Insurance auth pending.  Will follow.   Reche Lowers, PT, DPT Admissions Coordinator [phone] 10/08/24 9:52 AM

## 2024-10-08 NOTE — Progress Notes (Signed)
 Occupational Therapy Treatment Patient Details Name: Bryce Martinez MRN: [id number] DOB: 1951-09-08 Today's Date: 10/08/2024   History of present illness 73 yo male admitted 11/24 with ARDS due to aspiration PNA after witnessed emesis during routine colonscopy at [facility]. intubated 11/24-12/1  COVID (+) PMH: OA, GERD, Hep C with advanced fibrosis, HLD, HTN, R TSR DM2, smoker   OT comments  Co-treat with PT for pt/therapist safety with OT focused on training in techniques for increased safety and independence with ADLs and improved activity tolerance during functional tasks. Pt currently demonstrating ability to complete UB dressing with CGA to Min assist and LB dressing with Min assist +2 for safety. Pt also demonstrating ability to complete bed mobility in preparation for functional tasks with Mod assist and functional transfers/mobility with a RW with largely Min assist +2. Pt tolerated periods of 3 minutes and 2.5 minutes of activity in standing prior to needing a seated rest break this session. Pt VSS on RA throughout session. Pt participated well, is motivated to return to PLOF, and has good family support. Pt making good progress toward OT goals. Acute OT to continue to follow. Post acute discharge, pt will benefit from intensive inpatient skilled rehab services > 3 hours per day to maximize rehab potential.       If plan is discharge home, recommend the following:  Two people to help with walking and/or transfers;Two people to help with bathing/dressing/bathroom;Assistance with cooking/housework;Assistance with feeding;Assist for transportation;Help with stairs or ramp for entrance   Equipment Recommendations  BSC/3in1;Tub/shower bench;Other (comment) (RW; other TBD based on progress)    Recommendations for Other Services Rehab consult    Precautions / Restrictions Precautions Precautions: Fall;Other (comment) Recall of Precautions/Restrictions: Intact Precaution/Restrictions Comments:  coretrak Restrictions Weight Bearing Restrictions Per Provider Order: No       Mobility Bed Mobility Overal bed mobility: Needs Assistance Bed Mobility: Sit to Supine     Supine to sit: HOB elevated, Used rails, Mod assist     General bed mobility comments: assist to manage LE off bed, pt able to push up to seated,    Transfers Overall transfer level: Needs assistance Equipment used: Rolling walker (2 wheels) Transfers: Sit to/from Stand, Bed to chair/wheelchair/BSC Sit to Stand: Min assist, +2 safety/equipment, From elevated surface     Step pivot transfers: Min assist, +2 safety/equipment, +2 physical assistance     General transfer comment: minAx2 for power up from elevated bed surface and heavy minAx2 for power up from low recliner surface to come to standing in RW     Balance Overall balance assessment: Needs assistance Sitting-balance support: Bilateral upper extremity supported, Single extremity supported, Feet supported Sitting balance-Leahy Scale: Fair     Standing balance support: Bilateral upper extremity supported, During functional activity, Reliant on assistive device for balance Standing balance-Leahy Scale: Poor Standing balance comment: reliant on B UE support and assist of therapist                           ADL either performed or assessed with clinical judgement   ADL Overall ADL's : Needs assistance/impaired                 Upper Body Dressing : Contact guard assist;Minimal assistance;Sitting   Lower Body Dressing: Maximal assistance;+2 for safety/equipment;Sit to/from stand;Cueing for compensatory techniques   Toilet Transfer: Minimal assistance;+2 for safety/equipment;Ambulation;BSC/3in1;Rolling walker (2 wheels) Toilet Transfer Details (indicate cue type and reason): simulated bed to chair  Functional mobility during ADLs: Minimal assistance;+2 for safety/equipment;Rolling walker (2 wheels) (with pt requiring  frequent seated rest breaks) General ADL Comments: Pt with decreased activity tolerance, fatiguing quickly during tasks. Pt with noted improvements in motor planning during functional tasks this session, but continuing to require mildly increased time for tasks.    Extremity/Trunk Assessment Upper Extremity Assessment Upper Extremity Assessment: Left hand dominant;Generalized weakness;RUE deficits/detail;LUE deficits/detail RUE Deficits / Details: hx of rotator cuff injury with repair several years ago with mildly decreased shoulder AROM at baseline secondary to this; AROM otherwise WFL; generalized weakness; decreased coordination but with noted improvements as compared to OT eval on 12/3 RUE Coordination: decreased gross motor;decreased fine motor LUE Deficits / Details: AROM shoulder flexion to approximately 85 degrees and AAROM WFL; AROM otherwise WFL; generalized weakness; decreased coordination but with noted improvements since OT eval on 12/3 LUE Coordination: decreased fine motor;decreased gross motor   Lower Extremity Assessment Lower Extremity Assessment: Defer to PT evaluation        Vision   Additional Comments: Vision [facility] for tasks assessed; not formally screened or evaluated   Perception     Praxis     Communication Communication Communication: Impaired Factors Affecting Communication: Reduced clarity of speech (soft voice)   Cognition Arousal: Alert Behavior During Therapy: Flat affect Cognition: No apparent impairments             OT - Cognition Comments: Pt AAOx4 and pleasant throughout session with cognition WFL for tasks assessed.                 Following commands: Intact        Cueing   Cueing Techniques: Verbal cues  Exercises      Shoulder Instructions       General Comments VSS on RA. Multiple family members present and supportive throughout session. RN present during a portion of session.    Pertinent Vitals/ Pain       Pain  Assessment Pain Assessment: Faces Faces Pain Scale: Hurts a little bit Pain Location: back Pain Descriptors / Indicators: Grimacing, Guarding Pain Intervention(s): Limited activity within patient's tolerance, Monitored during session, Repositioned  Home Living                                          Prior Functioning/Environment              Frequency  Min 2X/week        Progress Toward Goals  OT Goals(current goals can now be found in the care plan section)  Progress towards OT goals: Progressing toward goals  Acute Rehab OT Goals Patient Stated Goal: to continue to get better and to not need the coretrak anymore  Plan      Co-evaluation    PT/OT/SLP Co-Evaluation/Treatment: Yes Reason for Co-Treatment: For patient/therapist safety PT goals addressed during session: Mobility/safety with mobility OT goals addressed during session: ADL's and self-care;Other (comment) (Activity tolerance during functional tasks)      AM-PAC OT 6 Clicks Daily Activity     Outcome Measure   Help from another person eating meals?: A Little Help from another person taking care of personal grooming?: A Little Help from another person toileting, which includes using toliet, bedpan, or urinal?: A Lot Help from another person bathing (including washing, rinsing, drying)?: A Lot Help from another person to put on and taking off regular upper  body clothing?: A Little Help from another person to put on and taking off regular lower body clothing?: A Lot 6 Click Score: 15    End of Session Equipment Utilized During Treatment: Gait belt;Rolling walker (2 wheels)  OT Visit Diagnosis: Unsteadiness on feet (R26.81);Other abnormalities of gait and mobility (R26.89);Muscle weakness (generalized) (M62.81)   Activity Tolerance Patient tolerated treatment well   Patient Left in chair;with call bell/phone within reach;with chair alarm set;with family/visitor present   Nurse  Communication Mobility status        Time: 8381-8352 OT Time Calculation (min): 29 min  Charges: OT General Charges $OT Visit: 1 Visit OT Treatments $Self Care/Home Management : 8-22 mins  Bryce Rockey HERO., OTR/L, MA Acute Rehab [phone]   Bryce Martinez 10/08/2024, 6:26 PM

## 2024-10-08 NOTE — Progress Notes (Signed)
 Physical Therapy Treatment Patient Details Name: Bryce Martinez MRN: [id number] DOB: Feb 13, 1951 Today's Date: 10/08/2024   History of Present Illness 73 yo male admitted 11/24 with ARDS due to aspiration PNA after witnessed emesis during routine colonscopy at [facility]. intubated 11/24-12/1  COVID (+) PMH: OA, GERD, Hep C with advanced fibrosis, HLD, HTN, R TSR DM2, smoker    PT Comments  Pt supine in bed with wife in room. Bryce Martinez asks if we can remove the hose from his nose. Pt agreeable to work with therapy and is eager to try to walk. Pt is mod A for moving LE and min A for bringing trunk to upright to sit at EoB. Pt requires min-modAx2 for coming to standing in RW and is grossly contact guard for 2 bouts of ambulation ~12 feet with RW. Pt obviously fatigued with ambulation. Pt brother and sister in-law arrive at end of session. Pt obviously happy to see them. D/c plans remain appropriate. PT will continue to follow acutely.     If plan is discharge home, recommend the following: Two people to help with walking and/or transfers;Two people to help with bathing/dressing/bathroom   Can travel by private vehicle        Equipment Recommendations  Other (comment) (TBD, pending progress)    Recommendations for Other Services Rehab consult     Precautions / Restrictions Precautions Precautions: Fall;Other (comment) Recall of Precautions/Restrictions: Intact Precaution/Restrictions Comments: coretrak Restrictions Weight Bearing Restrictions Per Provider Order: No     Mobility  Bed Mobility Overal bed mobility: Needs Assistance Bed Mobility: Sit to Supine     Supine to sit: HOB elevated, Used rails, Mod assist Sit to supine: Mod assist   General bed mobility comments: assist to manage LE off bed, pt able to push up to seated,    Transfers Overall transfer level: Needs assistance   Transfers: Sit to/from Stand Sit to Stand: Min assist, +2 safety/equipment, From elevated  surface           General transfer comment: minAx2 for power up from elevated bed surface and heavy minAx2 for power up from low recliner surface to come to standing in RW    Ambulation/Gait Ambulation/Gait assistance: Min assist, +2 safety/equipment Gait Distance (Feet): 12 Feet (+10) Assistive device: Rolling walker (2 wheels) Gait Pattern/deviations: Step-to pattern, Trunk flexed, Narrow base of support Gait velocity: slowed Gait velocity interpretation: <1.31 ft/sec, indicative of household ambulator   General Gait Details: min A for steadying, cues for upright posture, looking up and out and widening BoS. Pt requires 1x seated rest break due to fatigue       Balance Overall balance assessment: Needs assistance Sitting-balance support: Bilateral upper extremity supported, Single extremity supported, Feet supported Sitting balance-Leahy Scale: Fair     Standing balance support: Bilateral upper extremity supported, During functional activity, Reliant on assistive device for balance Standing balance-Leahy Scale: Poor Standing balance comment: reliant on B UE support and assist of therapist                            Communication Communication Communication: Impaired Factors Affecting Communication: Reduced clarity of speech (soft voice)  Cognition Arousal: Alert Behavior During Therapy: Flat affect   PT - Cognitive impairments: No apparent impairments                         Following commands: Intact      Cueing Cueing Techniques:  Verbal cues     General Comments General comments (skin integrity, edema, etc.): VSS on RA      Pertinent Vitals/Pain Pain Assessment Pain Assessment: Faces Faces Pain Scale: Hurts a little bit Pain Location: back Pain Descriptors / Indicators: Grimacing, Guarding Pain Intervention(s): Limited activity within patient's tolerance, Monitored during session, Repositioned     PT Goals (current goals can now  be found in the care plan section) Acute Rehab PT Goals Patient Stated Goal: home PT Goal Formulation: With patient/family Time For Goal Achievement: 10/20/24 Potential to Achieve Goals: Good Progress towards PT goals: Progressing toward goals    Frequency    Min 3X/week           Co-evaluation PT/OT/SLP Co-Evaluation/Treatment: Yes Reason for Co-Treatment: For patient/therapist safety PT goals addressed during session: Mobility/safety with mobility        AM-PAC PT 6 Clicks Mobility   Outcome Measure  Help needed turning from your back to your side while in a flat bed without using bedrails?: A Lot Help needed moving from lying on your back to sitting on the side of a flat bed without using bedrails?: A Lot Help needed moving to and from a bed to a chair (including a wheelchair)?: A Lot Help needed standing up from a chair using your arms (e.g., wheelchair or bedside chair)?: A Lot Help needed to walk in hospital room?: Total Help needed climbing 3-5 steps with a railing? : Total 6 Click Score: 10    End of Session Equipment Utilized During Treatment: Gait belt Activity Tolerance: Patient tolerated treatment well Patient left: with call bell/phone within reach;with family/visitor present;in bed Nurse Communication: Mobility status PT Visit Diagnosis: Other abnormalities of gait and mobility (R26.89);Muscle weakness (generalized) (M62.81)     Time: 8381-8351 PT Time Calculation (min) (ACUTE ONLY): 30 min  Charges:    $Gait Training: 8-22 mins PT General Charges $$ ACUTE PT VISIT: 1 Visit                     Winnell Bento B. Fleeta Lapidus PT, DPT Acute Rehabilitation Services Please use secure chat or  Call Office [phone]    Almarie KATHEE Fleeta [facility] 10/08/2024, 5:36 PM

## 2024-10-08 NOTE — Progress Notes (Signed)
 Nutrition Follow-up  DOCUMENTATION CODES:  Not applicable  INTERVENTION:  Transition to nocturnal tube feeding to encourage appetite. Run Osmolite 1.5 at 75 mL/hr X 18 hours from noon to 6 am via duotube. Continue 1 packet ProSource TF20 once daily via duotube. Continue minimal water  flushes since sodium is on the low end. This enteral nutrition regimen provides 1350 mL formula, 2105 Kcals, 105 g protein, 275 g carbohydrates, and 1029 mL free water  daily, which meets approximately 95% of the patient's minimum estimated nutrition needs.  Ensure Plus High Protein PO BID. Each supplement provides 350 Kcals and 20 grams of protein. The patient's wife is encouraged to bring the patient's preferred foods. Continue to follow for TF weaning and may consider an appetite stimulant in the future if necessary.   NUTRITION DIAGNOSIS:  Increased nutrient needs related to acute illness as evidenced by estimated needs - remains applicable  GOAL:  Patient will meet greater than or equal to 90% of their needs - progressing  MONITOR:  PO intake, Supplement acceptance, Diet advancement, Labs, Weight trends, TF tolerance, I & O's  REASON FOR ASSESSMENT:  Consult Enteral/tube feeding initiation and management  ASSESSMENT:  Patient presented after aspirating at routine coloscopy and was found to have ARDS requiring intubation. PMH DM2, HTN, HLD, GERD, Hepatitis C, and advanced fibrosis.  11/24 Patient presented to [facility] ED after aspirating at routine colonoscopy, was intubated, and transferred to [facility] ICU . 11/26 Trickle TF initiated  11/28 Cortrak placed 12/01 Extubated 12/02 SLP swallow eval = DYS3/thin 12/05 FEES shows mild dysphagia, SLP recommends mech soft with thin liquids, OK to upgrade to reg/thin when patient desires.  Update: Osmolite 1.5 remains at 65 mL/hr with ProSource TF20 once daily. Visited the patient with his wife [name] at bedside. The patient is having minimal PO.  He states nothing tastes good here at the hospital. Encouraged [name] to bring the patient's preferred foods from home and the patient is also amenable to Ensure Plus. We discussed reducing TF to encourage appetite and they both agreed. Last BM noted to be 12/3 - the patient states he does not regularly have BMs daily so this is WNL for him. He is working with therapy to ambulate.  Scheduled Meds:  Chlorhexidine  Gluconate Cloth  6 each Topical Daily   docusate  100 mg Per Tube BID   feeding supplement (PROSource TF20)  60 mL Per Tube Daily   guaiFENesin   10 mL Per Tube BID   heparin   5,000 Units Subcutaneous Q8H   insulin  aspart  0-20 Units Subcutaneous Q4H   insulin  glargine-yfgn  8 Units Subcutaneous Daily   lidocaine   1 patch Transdermal Daily   losartan   100 mg Per Tube Daily   mouth rinse  15 mL Mouth Rinse TID   senna  1 tablet Per Tube QHS   Continuous Infusions:  feeding supplement (OSMOLITE 1.5 CAL) 1,000 mL (10/08/24 0550)    Diet Order             DIET DYS 3 Room service appropriate? Yes with Assist; Fluid consistency: Thin  Diet effective now                  Meal Intake: Minimal  Labs:     Latest Ref Rng & Units 10/08/2024    5:39 AM 10/07/2024    8:46 AM 10/05/2024    4:20 AM  CMP  Glucose 70 - 99 mg/dL 869  895  856   BUN 8 -  23 mg/dL 32  37  53   Creatinine 0.61 - 1.24 mg/dL 9.09  9.13  8.94   Sodium 135 - 145 mmol/L 135  134  147   Potassium 3.5 - 5.1 mmol/L 4.5  5.2  4.0   Chloride 98 - 111 mmol/L 101  100  109   CO2 22 - 32 mmol/L 27  24  29    Calcium  8.9 - 10.3 mg/dL 9.1  8.6  8.7   Total Protein 6.5 - 8.1 g/dL 6.0  5.5    Total Bilirubin 0.0 - 1.2 mg/dL 0.4  1.0    Alkaline Phos 38 - 126 U/L 65  68    AST 15 - 41 U/L 44  55    ALT 0 - 44 U/L 72  72     I/O: +750 mL since admit  NUTRITION - FOCUSED PHYSICAL EXAM: Flowsheet Row Most Recent Value  Orbital Region Mild depletion  Upper Arm Region No depletion  Thoracic and Lumbar Region No  depletion  Buccal Region No depletion  Temple Region Mild depletion  Clavicle Bone Region No depletion  Clavicle and Acromion Bone Region Mild depletion  Scapular Bone Region No depletion  Dorsal Hand No depletion  Patellar Region No depletion  Anterior Thigh Region No depletion  Posterior Calf Region No depletion  Edema (RD Assessment) None  Hair Reviewed  Eyes Reviewed  Mouth Reviewed  Skin Reviewed  Nails Reviewed    EDUCATION NEEDS:  Education needs have been addressed  Skin:  Skin Assessment: Reviewed RN Assessment  Last BM:  12/3 type 7  Height:  Ht Readings from Last 1 Encounters:  09/27/24 6' 1 (1.854 m)   Weight:  Wt Readings from Last 10 Encounters:  10/08/24 77.3 kg  09/27/24 81.2 kg  08/24/24 79.2 kg  06/29/24 79.4 kg  05/21/24 79.4 kg  01/05/24 82.4 kg  10/22/23 79.9 kg  05/20/23 77.6 kg  01/06/23 79.8 kg  11/19/22 79.9 kg   Weight Change: 4 Kg (5%) loss in 1 week = clinically significant but possibly due to improvement of edema during this admission, will continue to monitor  Edema: non-pitting BLE and generalized  Usual Body Weight: patient's wife reported stable weight PTA  Ideal Body Weight:  83.6 kg   BMI:  Body mass index is 22.48 kg/m.  Estimated Nutritional Needs:  Kcal:  2200-2500 kcal/d Protein:  110-125g/d Fluid:  2.5L/d    Leverne Ruth, MS, RDN, LDN [place] See AMION for contact information Secure chat preferred

## 2024-10-08 NOTE — Plan of Care (Signed)
?  Problem: Education: ?Goal: Knowledge of General Education information will improve ?Description: Including pain rating scale, medication(s)/side effects and non-pharmacologic comfort measures ?Outcome: Progressing ?  ?Problem: Clinical Measurements: ?Goal: Ability to maintain clinical measurements within normal limits will improve ?Outcome: Progressing ?Goal: Respiratory complications will improve ?Outcome: Progressing ?  ?Problem: Nutrition: ?Goal: Adequate nutrition will be maintained ?Outcome: Progressing ?  ?

## 2024-10-08 NOTE — H&P (Incomplete)
 Physical Medicine and Rehabilitation Admission H&P    Chief Complaint  Patient presents with   Critical illness myopathy with functional deficits    HPI:  Bryce Martinez is a 73 year old male with history of Hep C with advanced fibrosis, HTN, GERD, colon polyps who was admitted via [facility] on 09/27/2024 with SOB, tachypnea and reports of emesis post routine colonoscopy earlier that day. He was found to have hypoxic respiratory failure felt to be due to ARDS from pneumonitis. He was treated with Unasyn , steroids and required intubation with pressors for management of septic shock as well as transfer to [facility] for management.  He received stress dose steroids and treated with 5 day course antibiotics for   2D echo done revealing EF 25-30% felt to be due to septic CM and HF treated with diuresis. .    ROS   Past Medical History:  Diagnosis Date   Allergy    Arthritis    ED (erectile dysfunction)    GERD (gastroesophageal reflux disease)    Occasionally   Hepatitis C    Hyperlipidemia    Hypertension     Past Surgical History:  Procedure Laterality Date   APPENDECTOMY     CHOLECYSTECTOMY N/A 04/25/2014   Procedure: LAPAROSCOPIC CHOLECYSTECTOMY;  Surgeon: Oneil DELENA Budge, MD;  Location: [facility];  Service: General;  Laterality: N/A;   COLONOSCOPY N/A 09/01/2019   Procedure: COLONOSCOPY;  Surgeon: Golda Claudis PENNER, MD;  Location: [facility];  Service: Endoscopy;  Laterality: N/A;  930   COLONOSCOPY N/A 09/27/2024   Procedure: COLONOSCOPY;  Surgeon: Eartha Angelia Sieving, MD;  Location: [facility];  Service: Gastroenterology;  Laterality: N/A;  7:30am, ASA 1-2   HEMICOLECTOMY Right    lap hand assisted   POLYPECTOMY  09/01/2019   Procedure: POLYPECTOMY;  Surgeon: Golda Claudis PENNER, MD;  Location: [facility];  Service: Endoscopy;;  colon   right shoulder surgery Right 03/2005   rotator cuff repair    Family History  Problem Relation Age of Onset   Hypertension Mother     Cancer Father        colon    Hypertension Sister    Hypertension Brother    Diabetes Brother    Drug abuse Brother    Hypertension Daughter    Hypertension Son     Social History:  reports that he quit smoking about 52 years ago. His smoking use included cigarettes. He started smoking about 67 years ago. He has a 4.4 pack-year smoking history. He has been exposed to tobacco smoke. He has never used smokeless tobacco. He reports that he does not currently use alcohol. He reports that he does not use drugs.   Allergies  Allergen Reactions   Amlodipine  Other (See Comments)    unknown    Medications Prior to Admission  Medication Sig Dispense Refill   spironolactone  (ALDACTONE ) 25 MG tablet TAKE 1 TABLET (25 MG TOTAL) BY MOUTH DAILY. 90 tablet 3   losartan  (COZAAR ) 100 MG tablet TAKE 1 TABLET BY MOUTH EVERY DAY 90 tablet 1   meclizine  (ANTIVERT ) 25 MG tablet Take 1 tablet (25 mg total) by mouth 2 (two) times daily as needed for dizziness. 30 tablet 0   Misc. Devices MISC Blood pressure cuff/device - 1. ICD10: I10 1 each 0     Home: Home Living Family/patient expects to be discharged to:: Private residence Living Arrangements: Spouse/significant other Available Help at Discharge: Family, Available 24 hours/day Type of  Home: House Home Access: Stairs to enter [employer] of Steps: 3 Entrance Stairs-Rails: Right, Left Home Layout: Laundry or work area in basement, One level Bathroom Shower/Tub: Engineer, Manufacturing Systems: Standard Home Equipment: None   Functional History: Prior Function Prior Level of Function : Independent/Modified Independent, Working/employed, Driving Mobility Comments: Independent without an AD. ADLs Comments: Independent with ADLs and IADLs. Drives. 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  Functional Status:  Mobility: Bed Mobility Overal bed mobility: Needs Assistance Bed  Mobility: Sit to Supine Supine to sit: Mod assist, HOB elevated, Used rails Sit to supine: Mod assist General bed mobility comments: assist to manage LE off bed, pt able to push up to seated, modA for returning LE to bed Transfers Overall transfer level: Needs assistance Equipment used: Rolling walker (2 wheels) Transfers: Sit to/from Stand Sit to Stand: Min assist, Mod assist Bed to/from chair/wheelchair/BSC transfer type:: Squat pivot Squat pivot transfers: Max assist, +2 safety/equipment, +2 physical assistance Transfer via Lift Equipment: Stedy General transfer comment: pt able to perform 5x STS in [place], with modA for first stand and minA for subsequent stand. pt reports it feels good to stand up Ambulation/Gait General Gait Details: unable    ADL: ADL Overall ADL's : Needs assistance/impaired Eating/Feeding: Set up, Minimal assistance, Sitting Grooming: Minimal assistance, Moderate assistance, Sitting Upper Body Bathing: Moderate assistance, Maximal assistance, Sitting Lower Body Bathing: Total assistance, +2 for physical assistance, +2 for safety/equipment, Sit to/from stand Upper Body Dressing : Moderate assistance, Sitting (with increased time) Lower Body Dressing: Total assistance, +2 for physical assistance, +2 for safety/equipment, Sit to/from stand Toilet Transfer: Maximal assistance, Squat-pivot, Rolling walker (2 wheels), BSC/3in1 Toilet Transfer Details (indicate cue type and reason): simulated from recliner to bed Toileting- Clothing Manipulation and Hygiene: Set up, Contact guard assist, Bed level, Total assistance, +2 for physical assistance, +2 for safety/equipment, Sit to/from stand Toileting - Clothing Manipulation Details (indicate cue type and reason): Set up to CGA for use of handheld urinal; Total assist +2 for pericare in stit/stand Functional mobility during ADLs:  (deferred this session) General ADL Comments: Pt with decreased activity tolerance,  fatiguing quickly during tasks. Pt also requiring increased time for motor planning and with funcitonal level affected by generalized weakness.  Cognition: Cognition Orientation Level: Oriented X4 Cognition Arousal: Alert Behavior During Therapy: Flat affect  Physical Exam: Blood pressure 134/69, pulse 67, temperature (!) 97.1 F (36.2 C), resp. rate 18, height 6' 1 (1.854 m), weight 77.3 kg, SpO2 96%. Physical Exam  Results for orders placed or performed during the hospital encounter of 09/27/24 (from the past 48 hours)  Glucose, capillary     Status: Abnormal   Collection Time: 10/06/24  3:56 PM  Result Value Ref Range   Glucose-Capillary 194 (H) 70 - 99 mg/dL    Comment: Glucose reference range applies only to samples taken after fasting for at least 8 hours.  Glucose, capillary     Status: Abnormal   Collection Time: 10/06/24  8:26 PM  Result Value Ref Range   Glucose-Capillary 141 (H) 70 - 99 mg/dL    Comment: Glucose reference range applies only to samples taken after fasting for at least 8 hours.  Glucose, capillary     Status: Abnormal   Collection Time: 10/06/24 11:58 PM  Result Value Ref Range   Glucose-Capillary 146 (H) 70 - 99 mg/dL    Comment: Glucose reference range applies only to samples taken after fasting for at  least 8 hours.  Glucose, capillary     Status: Abnormal   Collection Time: 10/07/24  4:17 AM  Result Value Ref Range   Glucose-Capillary 154 (H) 70 - 99 mg/dL    Comment: Glucose reference range applies only to samples taken after fasting for at least 8 hours.  Glucose, capillary     Status: None   Collection Time: 10/07/24  8:02 AM  Result Value Ref Range   Glucose-Capillary 98 70 - 99 mg/dL    Comment: Glucose reference range applies only to samples taken after fasting for at least 8 hours.  Comprehensive metabolic panel with GFR     Status: Abnormal   Collection Time: 10/07/24  8:46 AM  Result Value Ref Range   Sodium 134 (L) 135 - 145 mmol/L    Potassium 5.2 (H) 3.5 - 5.1 mmol/L   Chloride 100 98 - 111 mmol/L   CO2 24 22 - 32 mmol/L   Glucose, Bld 104 (H) 70 - 99 mg/dL    Comment: Glucose reference range applies only to samples taken after fasting for at least 8 hours.   BUN 37 (H) 8 - 23 mg/dL   Creatinine, Ser 9.13 0.61 - 1.24 mg/dL   Calcium  8.6 (L) 8.9 - 10.3 mg/dL   Total Protein 5.5 (L) 6.5 - 8.1 g/dL   Albumin 2.0 (L) 3.5 - 5.0 g/dL   AST 55 (H) 15 - 41 U/L   ALT 72 (H) 0 - 44 U/L   Alkaline Phosphatase 68 38 - 126 U/L   Total Bilirubin 1.0 0.0 - 1.2 mg/dL   GFR, Estimated >39 >39 mL/min    Comment: (NOTE) Calculated using the CKD-EPI Creatinine Equation (2021)    Anion gap 10 5 - 15    Comment: Performed at [facility], [address] [place], [place] [address]  Magnesium      Status: None   Collection Time: 10/07/24  8:46 AM  Result Value Ref Range   Magnesium  2.0 1.7 - 2.4 mg/dL    Comment: Performed at [facility], [address] [place], [place] [address]  CBC     Status: Abnormal   Collection Time: 10/07/24  8:46 AM  Result Value Ref Range   WBC 13.8 (H) 4.0 - 10.5 K/uL   RBC 4.67 4.22 - 5.81 MIL/uL   Hemoglobin 13.1 13.0 - 17.0 g/dL   HCT 60.1 60.9 - 47.9 %   MCV 85.2 80.0 - 100.0 fL   MCH 28.1 26.0 - 34.0 pg   MCHC 32.9 30.0 - 36.0 g/dL   RDW 85.6 88.4 - 84.4 %   Platelets 264 150 - 400 K/uL   nRBC 0.1 0.0 - 0.2 %    Comment: Performed at [facility], [address], [place], [place] [address]  Glucose, capillary     Status: Abnormal   Collection Time: 10/07/24 11:57 AM  Result Value Ref Range   Glucose-Capillary 152 (H) 70 - 99 mg/dL    Comment: Glucose reference range applies only to samples taken after fasting for at least 8 hours.  Glucose, capillary     Status: Abnormal   Collection Time: 10/07/24  5:27 PM  Result Value Ref Range   Glucose-Capillary 136 (H) 70 - 99 mg/dL    Comment: Glucose reference range applies only to samples taken after fasting for at least 8  hours.  Glucose, capillary     Status: Abnormal   Collection Time: 10/07/24  8:29 PM  Result Value Ref Range   Glucose-Capillary 137 (H) 70 - 99 mg/dL    Comment: Glucose reference range applies only to samples taken after fasting for at least 8 hours.  Glucose, capillary     Status: Abnormal   Collection Time: 10/08/24 12:25 AM  Result Value Ref Range   Glucose-Capillary 158 (H) 70 - 99 mg/dL    Comment: Glucose reference range applies only to samples taken after fasting for at least 8 hours.  Glucose, capillary     Status: Abnormal   Collection Time: 10/08/24  4:27 AM  Result Value Ref Range   Glucose-Capillary 128 (H) 70 - 99 mg/dL    Comment: Glucose reference range applies only to samples taken after fasting for at least 8 hours.  Comprehensive metabolic panel with GFR     Status: Abnormal   Collection Time: 10/08/24  5:39 AM  Result Value Ref Range   Sodium 135 135 - 145 mmol/L   Potassium 4.5 3.5 - 5.1 mmol/L   Chloride 101 98 - 111 mmol/L   CO2 27 22 - 32 mmol/L   Glucose, Bld 130 (H) 70 - 99 mg/dL    Comment: Glucose reference range applies only to samples taken after fasting for at least 8 hours.   BUN 32 (H) 8 - 23 mg/dL   Creatinine, Ser 9.09 0.61 - 1.24 mg/dL   Calcium  9.1 8.9 - 10.3 mg/dL   Total Protein 6.0 (L) 6.5 - 8.1 g/dL   Albumin 2.0 (L) 3.5 - 5.0 g/dL   AST 44 (H) 15 - 41 U/L   ALT 72 (H) 0 - 44 U/L   Alkaline Phosphatase 65 38 - 126 U/L   Total Bilirubin 0.4 0.0 - 1.2 mg/dL   GFR, Estimated >39 >39 mL/min    Comment: (NOTE) Calculated using the CKD-EPI Creatinine Equation (2021)    Anion gap 7 5 - 15    Comment: Performed at [facility], [address] [place], [place] [address]  CBC     Status: Abnormal   Collection Time: 10/08/24  5:39 AM  Result Value Ref Range   WBC 15.2 (H) 4.0 - 10.5 K/uL   RBC 4.36 4.22 - 5.81 MIL/uL   Hemoglobin 12.1 (L) 13.0 - 17.0 g/dL   HCT 63.3 (L) 60.9 - 47.9 %   MCV 83.9 80.0 - 100.0 fL   MCH 27.8 26.0 -  34.0 pg   MCHC 33.1 30.0 - 36.0 g/dL   RDW 85.7 88.4 - 84.4 %   Platelets 344 150 - 400 K/uL   nRBC 0.2 0.0 - 0.2 %    Comment: Performed at [facility], [address] [place], [place] [address]  Magnesium      Status: None   Collection Time: 10/08/24  5:39 AM  Result Value Ref Range   Magnesium  1.9 1.7 - 2.4 mg/dL    Comment: Performed at [facility], [address], [place], [place] [address]  Glucose, capillary     Status: Abnormal   Collection Time: 10/08/24  8:07 AM  Result Value Ref Range   Glucose-Capillary 138 (H) 70 - 99 mg/dL    Comment: Glucose reference range applies only to samples taken after fasting for at least 8 hours.   No results found.    Blood pressure 134/69, pulse 67, temperature (!) 97.1 F (36.2 C), resp. rate 18, height 6' 1 (1.854 m), weight 77.3 kg, SpO2 96%.  Medical Problem List and Plan: 1.  Functional deficits secondary to ***  -patient may *** shower  -ELOS/Goals: *** 2.  Antithrombotics: -DVT/anticoagulation:  {VTE PROPHYLAXIS/ANTICOAGULATION - UBEZ:[id number]}  -antiplatelet therapy: *** 3. Pain Management: *** 4. Mood/Behavior/Sleep: ***  -antipsychotic agents: *** 5. Neuropsych/cognition: This patient *** capable of making decisions on *** own behalf. 6. Skin/Wound Care: *** 7. Fluids/Electrolytes/Nutrition: ***     ***  Sharlet GORMAN Schmitz, PA-C 10/08/2024

## 2024-10-08 NOTE — Discharge Summary (Incomplete)
 Physician Discharge Summary  Bryce Martinez FMW:[id number] DOB: May 03, 1951 DOA: 09/27/2024  PCP: Antonetta Rollene BRAVO, MD  Admit date: 09/27/2024 Discharge date: 10/08/2024  Admitted From: home Disposition:  CIR  Recommendations for Outpatient Follow-up:  Follow up with PCP in 1-2 weeks  Home Health: none Equipment/Devices: none  Discharge Condition: stable CODE STATUS: Full code Diet Orders (From admission, onward)     Start     Ordered   10/05/24 1256  DIET DYS 3 Room service appropriate? Yes with Assist; Fluid consistency: Thin  Diet effective now       Question Answer Comment  Room service appropriate? Yes with Assist   Fluid consistency: Thin      10/05/24 1255           Brief Narrative / Interim history: 73 year old male with history of HTN, HLD, hep C status posttreatment, advanced fibrosis was initially seen at [facility] on 11/24 for routine screening colonoscopy, he apparently aspirated during the procedure, monitored and discharged home on room air.  At home she has had worsening respiratory symptoms, was brought back requiring 15 L, respiratory status got worse and eventually ended up being intubated.  He developed presumed ARDS due to aspiration.  Incidentally he was found to be COVID-positive as well.  Eventually extubated, and transferred to the hospitalist service 12/3  Hospital Course / Discharge diagnoses: Principal problem Acute hypoxic respiratory failure due to ARDS from aspiration pneumonia -requiring intubation initially.  He is status post 5 days of antibiotics, now completed.  He was eventually extubated on 12/1, respiratory status much better, now he is on room air. 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  2D echocardiogram  showed LVEF 25 to 30%, global hypokinesis.  RV systolic function was normal.  He is on losartan , continue.  Anticipate recovery, will need repeat echo in 3 to 4 weeks as an outpatient COVID-positive-likely incidental, his ARDS was due to aspiration given timing postanesthesia, never had any fever, chills, flulike illness so I wonder whether this is an older infection and the PCR was positive still. No further contact precautions needed.  History of hep C, advanced fibrosis -outpatient follow-up with GI AKI-creatinine improved, now normalized Hyperkalemia -s/p Lokelma  DM2 -continue sliding scale  Sepsis ruled out   Discharge Instructions   Allergies as of 10/08/2024       Reactions   Amlodipine  Other (See Comments)   unknown        Medication List     TAKE these medications    losartan  100 MG tablet Commonly known as: COZAAR  TAKE 1 TABLET BY MOUTH EVERY DAY   meclizine  25 MG tablet Commonly known as: ANTIVERT  Take 1 tablet (25 mg total) by mouth 2 (two) times daily as needed for dizziness.   Misc. Devices Misc Blood pressure cuff/device - 1. ICD10: I10   spironolactone  25 MG tablet Commonly known as: ALDACTONE  TAKE 1 TABLET (25 MG TOTAL) BY MOUTH DAILY.       Consultations: PCCM  Procedures/Studies:  DG CHEST PORT 1 VIEW Result Date: 10/04/2024 CLINICAL DATA:  [id number] SOB (shortness of breath) [id number] EXAM: PORTABLE CHEST - 1 VIEW COMPARISON:  October 02, 2024 FINDINGS: Low lung volumes. Patchy opacities in both lung bases. No pleural effusion or pneumothorax. No cardiomegaly. Aortic atherosclerosis. No acute fracture or  destructive lesions. Multilevel thoracic osteophytosis. Right IJ approach central venous catheter terminates at the cavoatrial junction. Endotracheal tube is well-positioned in the mid trachea. Weighted feeding tube courses below the diaphragm with the distal tip not included in the field of view. IMPRESSION: 1. Similar patchy airspace opacities in  both lung bases. No pleural effusion or pneumothorax. 2. Similar positioning of the support tubes and lines, as described above. Electronically Signed   By: Rogelia Myers M.D.   On: 10/04/2024 10:04   DG Chest 1 View Result Date: 10/02/2024 EXAM: 1 VIEW(S) XRAY OF THE CHEST 10/02/2024 08:03:00 AM COMPARISON: 09/27/2024 CLINICAL HISTORY: ARDS (adult respiratory distress syndrome) (HCC) FINDINGS: LINES, TUBES AND DEVICES: Stable endotracheal tube. Enteric feeding tube in place, courses to abdomen with tip not included. Stable right IJ central line. LUNGS AND PLEURA: Increased confluence of left lung base opacity with obscuration of left hemidiaphragm. Improved left lung aeration with decreased but persistent widespread left lung airspace opacity. Possible left pleural effusion. No pneumothorax. HEART AND MEDIASTINUM: No acute abnormality of the cardiac and mediastinal silhouettes. BONES AND SOFT TISSUES: Chronic right shoulder calcific tendinosis redemonstrated. No acute osseous abnormality. IMPRESSION: 1. Increased confluence of left lung base opacity with obscuration of left hemidiaphragm. 2. Improved left lung aeration with decreased but persistent widespread left lung airspace opacity. 3. Possible left pleural effusion. Electronically signed by: Evalene Coho MD 10/02/2024 08:21 AM EST RP Workstation: [id number]   DG Abd 1 View Result Date: 10/02/2024 EXAM: 1 VIEW XRAY OF THE ABDOMEN 10/02/2024 08:03:00 AM COMPARISON: None available. CLINICAL HISTORY: Abdominal distension. FINDINGS: LINES, TUBES AND DEVICES: There is a feeding tube present with its tip in the distal stomach. BOWEL: There is mild diffuse gaseous distention of the bowel. SOFT TISSUES: Surgical clips are present in the gallbladder fossa. No opaque urinary calculi. BONES: No acute osseous abnormality. LUNGS: There is coarsened streaky opacification/consolidation of the left lung base. IMPRESSION: 1. Mild diffuse gaseous distention of the  bowel. 2. Coarsened streaky opacification/consolidation of the left lung base. Electronically signed by: Evalene Coho MD 10/02/2024 08:21 AM EST RP Workstation: [id number]   ECHOCARDIOGRAM COMPLETE Result Date: 09/28/2024    ECHOCARDIOGRAM REPORT   Patient Name:   Bryce Martinez Date of Exam: 09/28/2024 Medical Rec #:  [id number]          Height:       73.0 in Accession #:    [id number]         Weight:       183.0 lb Date of Birth:  [birth year]          BSA:          2.072 m Patient Age:    73 years           BP:           143/87 mmHg Patient Gender: M                  HR:           104 bpm. Exam Location:  Inpatient Procedure: 2D Echo, Cardiac Doppler, Color Doppler and Intracardiac            Opacification Agent (Both Spectral and Color Flow Doppler were            utilized during procedure). Indications:     Shock  History:         Patient has no prior history of Echocardiogram examinations.  Signs/Symptoms:Shortness of Breath and Dyspnea; Risk                  Factors:Hypertension and Dyslipidemia. Covid positive.  Sonographer:     Ellouise Mose RDCS Referring Phys:  [id number] VERDON RAMAN DESAI Diagnosing Phys: Darryle Decent MD  Sonographer Comments: Echo performed with patient supine and on artificial respirator. IMPRESSIONS  1. Left ventricular ejection fraction, by estimation, is 25 to 30%. The left ventricle has severely decreased function. The left ventricle demonstrates global hypokinesis. There is mild concentric left ventricular hypertrophy. Left ventricular diastolic  parameters are indeterminate.  2. Right ventricular systolic function is normal. The right ventricular size is normal. Tricuspid regurgitation signal is inadequate for assessing PA pressure.  3. Moderate pleural effusion in the left lateral region.  4. The mitral valve is grossly normal. Trivial mitral valve regurgitation. No evidence of mitral stenosis.  5. The aortic valve is tricuspid. Aortic valve regurgitation is not  visualized. No aortic stenosis is present. Comparison(s): No prior Echocardiogram. FINDINGS  Left Ventricle: Left ventricular ejection fraction, by estimation, is 25 to 30%. The left ventricle has severely decreased function. The left ventricle demonstrates global hypokinesis. Definity  contrast agent was given IV to delineate the left ventricular endocardial borders. The left ventricular internal cavity size was normal in size. There is mild concentric left ventricular hypertrophy. Left ventricular diastolic parameters are indeterminate. Right Ventricle: The right ventricular size is normal. No increase in right ventricular wall thickness. Right ventricular systolic function is normal. Tricuspid regurgitation signal is inadequate for assessing PA pressure. Left Atrium: Left atrial size was normal in size. Right Atrium: Right atrial size was normal in size. Pericardium: There is no evidence of pericardial effusion. Mitral Valve: The mitral valve is grossly normal. Trivial mitral valve regurgitation. No evidence of mitral valve stenosis. Tricuspid Valve: The tricuspid valve is grossly normal. Tricuspid valve regurgitation is trivial. No evidence of tricuspid stenosis. Aortic Valve: The aortic valve is tricuspid. Aortic valve regurgitation is not visualized. No aortic stenosis is present. Pulmonic Valve: The pulmonic valve was grossly normal. Pulmonic valve regurgitation is trivial. No evidence of pulmonic stenosis. Aorta: The aortic root and ascending aorta are structurally normal, with no evidence of dilitation. Venous: IVC assessment for right atrial pressure unable to be performed due to mechanical ventilation. IAS/Shunts: No atrial level shunt detected by color flow Doppler. Additional Comments: There is a moderate pleural effusion in the left lateral region.  LEFT VENTRICLE PLAX 2D LVIDd:         4.20 cm     Diastology LVIDs:         3.10 cm     LV e' medial:    6.64 cm/s LV PW:         1.20 cm     LV E/e'  medial:  6.7 LV IVS:        1.10 cm     LV e' lateral:   6.09 cm/s LVOT diam:     2.40 cm     LV E/e' lateral: 7.4 LV SV:         53 LV SV Index:   26 LVOT Area:     4.52 cm  LV Volumes (MOD) LV vol d, MOD A2C: 60.2 ml LV vol d, MOD A4C: 47.9 ml LV vol s, MOD A2C: 31.7 ml LV vol s, MOD A4C: 28.4 ml LV SV MOD A2C:     28.5 ml LV SV MOD A4C:     47.9 ml LV  SV MOD BP:      25.6 ml RIGHT VENTRICLE             IVC RV S prime:     13.30 cm/s  IVC diam: 2.00 cm TAPSE (M-mode): 1.8 cm LEFT ATRIUM           Index       RIGHT ATRIUM           Index LA diam:      2.30 cm 1.11 cm/m  RA Area:     10.60 cm LA Vol (A2C): 4.2 ml  2.04 ml/m  RA Volume:   19.40 ml  9.36 ml/m LA Vol (A4C): 11.4 ml 5.50 ml/m  AORTIC VALVE LVOT Vmax:   99.80 cm/s LVOT Vmean:  60.900 cm/s LVOT VTI:    0.117 m  AORTA Ao Root diam: 3.50 cm Ao Asc diam:  3.50 cm MITRAL VALVE MV Area (PHT): 3.22 cm    SHUNTS MV Decel Time: 236 msec    Systemic VTI:  0.12 m MV E velocity: 44.80 cm/s  Systemic Diam: 2.40 cm MV A velocity: 52.95 cm/s MV E/A ratio:  0.85 Darryle Decent MD Electronically signed by Darryle Decent MD Signature Date/Time: 09/28/2024/12:29:40 PM    Final (Updated)    DG CHEST PORT 1 VIEW Result Date: 09/27/2024 EXAM: 1 VIEW(S) XRAY OF THE CHEST 09/27/2024 08:43:00 PM COMPARISON: 09/27/2024. CLINICAL HISTORY: [id number] Encounter for central line placement [phone] Encounter for central line placement FINDINGS: LINES, TUBES AND DEVICES: The endotracheal tube is present with the tip measuring 3.6 cm above the carina. The enteric tube is present. The tip is off the field of view but below the left hemidiaphragm. A right central venous catheter has been placed with the tip over the lower SVC region. LUNGS AND PLEURA: Patchy airspace disease in both lungs, more prominent on the left. This is similar to the prior study and likely represents edema, pneumonia or aspiration. No pleural effusion. No pneumothorax. HEART AND MEDIASTINUM: No acute abnormality  of the cardiac and mediastinal silhouettes. Mediastinal contours appear intact. BONES AND SOFT TISSUES: Degenerative changes in the right shoulder. IMPRESSION: 1. Right internal jugular central venous catheter with tip projecting over the lower SVC, without pneumothorax. 2. Patchy bilateral airspace disease, left greater than right, similar to the prior exam, likely representing edema, pneumonia, or aspiration. 3. No pleural effusion. Electronically signed by: Elsie Gravely MD 09/27/2024 08:47 PM EST RP Workstation: [id number]   DG Chest Port 1 View Result Date: 09/27/2024 EXAM: 1 VIEW(S) XRAY OF THE CHEST 09/27/2024 01:12:18 PM COMPARISON: 09/27/2024 CLINICAL HISTORY: POST INTUBATION FINDINGS: LINES, TUBES AND DEVICES: Endotracheal tube in place with tip 2.5 cm above the carina. Enteric tube in place with tip and side port overlying expected gastric lumen. Right upper quadrant surgical clips noted. LUNGS AND PLEURA: Persistent diffuse left lung airspace opacity potentially from pneumonia with involvement of the lower lobe and potentially parts of the upper lobe. New right perihilar and infrahilar airspace opacity. Appearance suspicious for multilobar pneumonia. Cannot exclude layering left pleural effusion. No pneumothorax. HEART AND MEDIASTINUM: No acute abnormality of the cardiac and mediastinal silhouettes. BONES AND SOFT TISSUES: Irregularity and deformity of the right acromion and potentially of the adjacent right humeral head. IMPRESSION: 1. Worsening multilobar pneumonia with increased involvement in the right perihilar and infrahilar regions , and continued substantial left lung airspace opacity. 2. Endotracheal and enteric tubes in expected positions. 3. Stable irregularity and deformity of the right acromion and adjacent right  humeral head. Electronically signed by: Ryan Salvage MD 09/27/2024 01:50 PM EST RP Workstation: [id number]   DG Chest Port 1 View Result Date: 09/27/2024 CLINICAL  DATA:  Shortness of breath. EXAM: PORTABLE CHEST 1 VIEW COMPARISON:  Chest radiograph dated 10/09/2022. FINDINGS: Diffuse airspace opacity in the left lung most consistent with pneumonia. Follow-up to resolution recommended. The right lung is clear. A small left pleural effusion may be present. No pneumothorax. Stable cardiac silhouette. No acute osseous pathology. IMPRESSION: Left lung pneumonia. Follow-up to resolution recommended. Electronically Signed   By: Vanetta Chou M.D.   On: 09/27/2024 10:59   US  ABDOMEN LIMITED RUQ (LIVER/GB) Result Date: 09/20/2024 CLINICAL DATA:  hep C follow up EXAM: ULTRASOUND ABDOMEN LIMITED RIGHT UPPER QUADRANT COMPARISON:  Mar 19, 2024 FINDINGS: Gallbladder: Surgically absent Common bile duct: Diameter: Visualized portion measures 2 mm, within normal limits. Majority is not visualized. Liver: No focal lesion identified. Previously described benign cyst is not discretely visualized on today's exam. Diffusely mildly increased in parenchymal echogenicity. Portal vein is patent on color Doppler imaging with normal direction of blood flow towards the liver. Other: None. IMPRESSION: No focal lesion identified. Electronically Signed   By: Corean Salter M.D.   On: 09/20/2024 10:45    Subjective: - no chest pain, shortness of breath, no abdominal pain, nausea or vomiting.   Discharge Exam: BP 129/66 (BP Location: Right Arm)   Pulse 64   Temp (!) 97.1 F (36.2 C)   Resp 18   Ht 6' 1 (1.854 m)   Wt 77.3 kg   SpO2 97%   BMI 22.48 kg/m   General: Pt is alert, awake, not in acute distress Cardiovascular: RRR, S1/S2 +, no rubs, no gallops Respiratory: CTA bilaterally, no wheezing, no rhonchi Abdominal: Soft, NT, ND, bowel sounds + Extremities: no edema, no cyanosis   The results of significant diagnostics from this hospitalization (including imaging, microbiology, ancillary and laboratory) are listed below for reference.     Microbiology: No results  found for this or any previous visit (from the past 240 hours).   Labs: Basic Metabolic Panel: Recent Labs  Lab 10/01/24 2236 10/02/24 0500 10/02/24 0500 10/03/24 0539 10/04/24 0500 10/05/24 0420 10/07/24 0846 10/08/24 0539  NA  --  142   < > 141 146* 147* 134* 135  K  --  3.9   < > 4.2 4.3 4.0 5.2* 4.5  CL  --  105   < > 103 106 109 100 101  CO2  --  27   < > 29 30 29 24 27   GLUCOSE  --  196*   < > 168* 162* 143* 104* 130*  BUN  --  50*   < > 52* 53* 53* 37* 32*  CREATININE  --  1.23   < > 1.14 1.27* 1.05 0.86 0.90  CALCIUM   --  8.2*   < > 8.2* 8.5* 8.7* 8.6* 9.1  MG  --  2.3  --  2.4  --  2.7* 2.0 1.9  PHOS 2.4* 3.3  --  2.7  --  4.6  --   --    < > = values in this interval not displayed.   Liver Function Tests: Recent Labs  Lab 10/02/24 0500 10/03/24 0539 10/07/24 0846 10/08/24 0539  AST 39 34 55* 44*  ALT 31 29 72* 72*  ALKPHOS 88 92 68 65  BILITOT 0.6 0.6 1.0 0.4  PROT 5.9* 5.7* 5.5* 6.0*  ALBUMIN 2.2* 2.2* 2.0* 2.0*  CBC: Recent Labs  Lab 10/03/24 0539 10/04/24 0500 10/05/24 0420 10/07/24 0846 10/08/24 0539  WBC 26.3* 22.4* 17.1* 13.8* 15.2*  HGB 11.3* 11.7* 12.2* 13.1 12.1*  HCT 34.2* 36.4* 37.6* 39.8 36.6*  MCV 84.7 86.1 86.2 85.2 83.9  PLT 137* 197 251 264 344   CBG: Recent Labs  Lab 10/07/24 2029 10/08/24 0025 10/08/24 0427 10/08/24 0807 10/08/24 1238  GLUCAP 137* 158* 128* 138* 120*   Hgb A1c No results for input(s): HGBA1C in the last 72 hours. Lipid Profile No results for input(s): CHOL, HDL, LDLCALC, TRIG, CHOLHDL, LDLDIRECT in the last 72 hours. Thyroid  function studies No results for input(s): TSH, T4TOTAL, T3FREE, THYROIDAB in the last 72 hours.  Invalid input(s): FREET3 Urinalysis    Component Value Date/Time   COLORURINE YELLOW 09/28/2024 1152   APPEARANCEUR HAZY (A) 09/28/2024 1152   APPEARANCEUR Clear 05/21/2024 1631   LABSPEC 1.024 09/28/2024 1152   PHURINE 5.0 09/28/2024 1152   GLUCOSEU  NEGATIVE 09/28/2024 1152   HGBUR NEGATIVE 09/28/2024 1152   BILIRUBINUR NEGATIVE 09/28/2024 1152   BILIRUBINUR Negative 05/21/2024 1631   KETONESUR NEGATIVE 09/28/2024 1152   PROTEINUR 30 (A) 09/28/2024 1152   UROBILINOGEN 1.0 09/22/2017 1639   NITRITE NEGATIVE 09/28/2024 1152   LEUKOCYTESUR NEGATIVE 09/28/2024 1152    FURTHER DISCHARGE INSTRUCTIONS:   Get Medicines reviewed and adjusted: Please take all your medications with you for your next visit with your Primary MD   Laboratory/radiological data: Please request your Primary MD to go over all hospital tests and procedure/radiological results at the follow up, please ask your Primary MD to get all Hospital records sent to his/her office.   In some cases, they will be blood work, cultures and biopsy results pending at the time of your discharge. Please request that your primary care M.D. goes through all the records of your hospital data and follows up on these results.   Also Note the following: If you experience worsening of your admission symptoms, develop shortness of breath, life threatening emergency, suicidal or homicidal thoughts you must seek medical attention immediately by calling 911 or calling your MD immediately  if symptoms less severe.   You must read complete instructions/literature along with all the possible adverse reactions/side effects for all the Medicines you take and that have been prescribed to you. Take any new Medicines after you have completely understood and accpet all the possible adverse reactions/side effects.    Do not drive when taking Pain medications or sleeping medications (Benzodaizepines)   Do not take more than prescribed Pain, Sleep and Anxiety Medications. It is not advisable to combine anxiety,sleep and pain medications without talking with your primary care practitioner   Special Instructions: If you have smoked or chewed Tobacco  in the last 2 yrs please stop smoking, stop any regular  Alcohol  and or any Recreational drug use.   Wear Seat belts while driving.   Please note: You were cared for by a hospitalist during your hospital stay. Once you are discharged, your primary care physician will handle any further medical issues. Please note that NO REFILLS for any discharge medications will be authorized once you are discharged, as it is imperative that you return to your primary care physician (or establish a relationship with a primary care physician if you do not have one) for your post hospital discharge needs so that they can reassess your need for medications and monitor your lab values.  Time coordinating discharge: 35 minutes  SIGNED:  Agustine Rossitto  Trixie, MD, PhD 10/08/2024, 2:10 PM

## 2024-10-08 NOTE — Plan of Care (Signed)
  Problem: Education: Goal: Knowledge of General Education information will improve Description: Including pain rating scale, medication(s)/side effects and non-pharmacologic comfort measures Outcome: Progressing   Problem: Health Behavior/Discharge Planning: Goal: Ability to manage health-related needs will improve Outcome: Progressing   Problem: Clinical Measurements: Goal: Ability to maintain clinical measurements within normal limits will improve Outcome: Progressing Goal: Will remain free from infection Outcome: Progressing Goal: Respiratory complications will improve Outcome: Progressing Goal: Cardiovascular complication will be avoided Outcome: Progressing   Problem: Activity: Goal: Risk for activity intolerance will decrease Outcome: Progressing   Problem: Nutrition: Goal: Adequate nutrition will be maintained Outcome: Progressing   Problem: Coping: Goal: Level of anxiety will decrease Outcome: Progressing   Problem: Elimination: Goal: Will not experience complications related to bowel motility Outcome: Progressing Goal: Will not experience complications related to urinary retention Outcome: Progressing   Problem: Pain Managment: Goal: General experience of comfort will improve and/or be controlled Outcome: Progressing   Problem: Safety: Goal: Ability to remain free from injury will improve Outcome: Progressing

## 2024-10-08 NOTE — Progress Notes (Signed)
 PROGRESS NOTE  Bryce Martinez FMW:[id number] DOB: Feb 28, 1951 DOA: 09/27/2024 PCP: Antonetta Rollene BRAVO, MD   LOS: 11 days   Brief Narrative / Interim history: 73 year old male with history of HTN, HLD, hep C status posttreatment, advanced fibrosis was initially seen at [facility] on 11/24 for routine screening colonoscopy, he apparently aspirated during the procedure, monitored and discharged home on room air.  At home she has had worsening respiratory symptoms, was brought back requiring 15 L, respiratory status got worse and eventually ended up being intubated.  He developed presumed ARDS due to aspiration.  Incidentally he was found to be COVID-positive as well.  Eventually extubated, and transferred to the hospitalist service 12/3  Significant events: 11/24 -Colonoscopy at [facility] with Dr. Eartha Flavors, postop O2 requirements showing 93% on RA at discharge. 11/24- Trialed 15L NRB, BiPAP without respiratory improvement. PCCM consulted for admission and worsening respiratory status,  intubated in [facility] ED.  11/24- Left radial arterial line placed  11/24- 2030, CVC was placed for increased Levophed  requirements 11/28- Failed SBT, will switch to Precedex  for sedation to improve respiratory drive 87/8- Successfully extubated to 3L [place] 12/4 room air  Subjective / 24h Interval events: Doing well, no complaints  Assesement and Plan: Principal problem Acute hypoxic respiratory failure due to ARDS from aspiration pneumonia -requiring intubation initially.  He is status post 5 days of antibiotics, now completed.  He was eventually extubated on 12/1, respiratory status much better, was on 3 L yesterday but now he is on room air - PT recommends CIR, I think he is an appropriate candidate, appreciate input - Continue to work with speech, he is eating more, discontinue core track when appropriate and discontinue over the next few days  Active problems Essential hypertension-continue  losartan , blood pressure stable  Acute systolic CHF -thought to be due to septic cardiomyopathy, shock.  2D echocardiogram showed LVEF 25 to 30%, global hypokinesis.  RV systolic function was normal.  He is on losartan , continue.  Anticipate recovery, will need repeat echo in 3 to 4 weeks as an outpatient  COVID-positive-likely incidental, his ARDS was due to aspiration given timing postanesthesia, never had any fever, chills, flulike illness so I wonder whether this is an older infection and the PCR was positive still.  Discontinue precautions today  History of hep C, advanced fibrosis -outpatient follow-up with GI  AKI-creatinine improved, creatinine is 0.8 this morning  Hyperkalemia -potassium slightly up to 5.2, Lokelma  x 1  DM2 -continue sliding scale  Deconditioning -in bed for the past 8 days due to intubation, ARDS, shock.  PT/OT recommending CIR, evaluation pending  Scheduled Meds:  Chlorhexidine  Gluconate Cloth  6 each Topical Daily   docusate  100 mg Per Tube BID   feeding supplement  237 mL Oral BID BM   feeding supplement (PROSource TF20)  60 mL Per Tube Daily   guaiFENesin   10 mL Per Tube BID   heparin   5,000 Units Subcutaneous Q8H   insulin  aspart  0-20 Units Subcutaneous Q4H   insulin  glargine-yfgn  8 Units Subcutaneous Daily   lidocaine   1 patch Transdermal Daily   losartan   100 mg Per Tube Daily   mouth rinse  15 mL Mouth Rinse TID   senna  1 tablet Per Tube QHS   Continuous Infusions:  feeding supplement (OSMOLITE 1.5 CAL) 1,000 mL (10/08/24 1310)   PRN Meds:.acetaminophen , hydrOXYzine , mouth rinse  Current Outpatient Medications  Medication Instructions   losartan  (COZAAR ) 100 mg, Oral, Daily  meclizine  (ANTIVERT ) 25 mg, Oral, 2 times daily PRN   Misc. Devices MISC Blood pressure cuff/device - 1. ICD10: I10   spironolactone  (ALDACTONE ) 25 mg, Oral, Daily    Diet Orders (From admission, onward)     Start     Ordered   10/05/24 1256  DIET DYS 3 Room  service appropriate? Yes with Assist; Fluid consistency: Thin  Diet effective now       Question Answer Comment  Room service appropriate? Yes with Assist   Fluid consistency: Thin      10/05/24 1255            DVT prophylaxis: heparin  injection 5,000 Units Start: 09/27/24 1630   Lab Results  Component Value Date   PLT 344 10/08/2024      Code Status: Full Code  Family Communication: Wife at bedside  Status is: Inpatient Remains inpatient appropriate because: Severity of illness   Level of care: Telemetry  Consultants:  PCCM  Objective: Vitals:   10/08/24 0500 10/08/24 0808 10/08/24 1238 10/08/24 1726  BP:  134/69 129/66 (!) 98/56  Pulse:  67 64 71  Resp:  18 18 18   Temp:  (!) 97.1 F (36.2 C)    TempSrc:      SpO2:  96% 97% 97%  Weight: 77.3 kg     Height:        Intake/Output Summary (Last 24 hours) at 10/08/2024 1800 Last data filed at 10/08/2024 0843 Gross per 24 hour  Intake 180 ml  Output 550 ml  Net -370 ml   Wt Readings from Last 3 Encounters:  10/08/24 77.3 kg  09/27/24 81.2 kg  08/24/24 79.2 kg    Examination: Constitutional: NAD Eyes: lids and conjunctivae normal, no scleral icterus ENMT: mmm Neck: normal, supple Respiratory: clear to auscultation bilaterally, no wheezing, no crackles. Normal respiratory effort.  Cardiovascular: Regular rate and rhythm, no murmurs / rubs / gallops. No LE edema. Abdomen: soft, no distention, no tenderness. Bowel sounds positive.  Skin: no rashes Neurologic: no focal deficits, equal strength   Data Reviewed: I have independently reviewed following labs and imaging studies   CBC Recent Labs  Lab 10/03/24 0539 10/04/24 0500 10/05/24 0420 10/07/24 0846 10/08/24 0539  WBC 26.3* 22.4* 17.1* 13.8* 15.2*  HGB 11.3* 11.7* 12.2* 13.1 12.1*  HCT 34.2* 36.4* 37.6* 39.8 36.6*  PLT 137* 197 251 264 344  MCV 84.7 86.1 86.2 85.2 83.9  MCH 28.0 27.7 28.0 28.1 27.8  MCHC 33.0 32.1 32.4 32.9 33.1  RDW  13.7 14.0 14.2 14.3 14.2    Recent Labs  Lab 10/02/24 0500 10/03/24 0539 10/04/24 0500 10/04/24 0950 10/05/24 0420 10/07/24 0846 10/08/24 0539  NA 142 141 146*  --  147* 134* 135  K 3.9 4.2 4.3  --  4.0 5.2* 4.5  CL 105 103 106  --  109 100 101  CO2 27 29 30   --  29 24 27   GLUCOSE 196* 168* 162*  --  143* 104* 130*  BUN 50* 52* 53*  --  53* 37* 32*  CREATININE 1.23 1.14 1.27*  --  1.05 0.86 0.90  CALCIUM  8.2* 8.2* 8.5*  --  8.7* 8.6* 9.1  AST 39 34  --   --   --  55* 44*  ALT 31 29  --   --   --  72* 72*  ALKPHOS 88 92  --   --   --  68 65  BILITOT 0.6 0.6  --   --   --  1.0 0.4  ALBUMIN 2.2* 2.2*  --   --   --  2.0* 2.0*  MG 2.3 2.4  --   --  2.7* 2.0 1.9  PROCALCITON  --   --   --  12.58  --   --   --   LATICACIDVEN 1.8  --   --   --   --   --   --     ------------------------------------------------------------------------------------------------------------------ No results for input(s): CHOL, HDL, LDLCALC, TRIG, CHOLHDL, LDLDIRECT in the last 72 hours.  Lab Results  Component Value Date   HGBA1C 5.5 09/16/2017   ------------------------------------------------------------------------------------------------------------------ No results for input(s): TSH, T4TOTAL, T3FREE, THYROIDAB in the last 72 hours.  Invalid input(s): FREET3  Cardiac Enzymes No results for input(s): CKMB, TROPONINI, MYOGLOBIN in the last 168 hours.  Invalid input(s): CK ------------------------------------------------------------------------------------------------------------------    Component Value Date/Time   BNP 251.0 (H) 09/28/2024 0238    CBG: Recent Labs  Lab 10/08/24 0025 10/08/24 0427 10/08/24 0807 10/08/24 1238 10/08/24 1726  GLUCAP 158* 128* 138* 120* 138*    No results found for this or any previous visit (from the past 240 hours).    Radiology Studies: No results found.   Nilda Fendt, MD, PhD [facility]  Between 7  am - 7 pm I am available, please contact me via Amion (for emergencies) or Securechat (non urgent messages)  Between 7 pm - 7 am I am not available, please contact night coverage MD/APP via Amion

## 2024-10-09 DIAGNOSIS — J80 Acute respiratory distress syndrome: Secondary | ICD-10-CM | POA: Diagnosis not present

## 2024-10-09 LAB — GLUCOSE, CAPILLARY
Glucose-Capillary: 100 mg/dL — ABNORMAL HIGH (ref 70–99)
Glucose-Capillary: 117 mg/dL — ABNORMAL HIGH (ref 70–99)
Glucose-Capillary: 136 mg/dL — ABNORMAL HIGH (ref 70–99)
Glucose-Capillary: 145 mg/dL — ABNORMAL HIGH (ref 70–99)
Glucose-Capillary: 145 mg/dL — ABNORMAL HIGH (ref 70–99)
Glucose-Capillary: 152 mg/dL — ABNORMAL HIGH (ref 70–99)
Glucose-Capillary: 177 mg/dL — ABNORMAL HIGH (ref 70–99)

## 2024-10-09 MED ORDER — CALCIUM CARBONATE ANTACID 500 MG PO CHEW
1.0000 | CHEWABLE_TABLET | Freq: Three times a day (TID) | ORAL | Status: DC | PRN
  Filled 2024-10-09: qty 1

## 2024-10-09 MED ORDER — MELATONIN 3 MG PO TABS
3.0000 mg | ORAL_TABLET | Freq: Every evening | ORAL | Status: DC | PRN
  Administered 2024-10-09 – 2024-10-11 (×4): 3 mg via ORAL
  Filled 2024-10-09 (×4): qty 1

## 2024-10-09 MED ORDER — MENTHOL 3 MG MT LOZG
1.0000 | LOZENGE | OROMUCOSAL | Status: DC | PRN
  Administered 2024-10-09: 3 mg via ORAL
  Filled 2024-10-09 (×2): qty 9

## 2024-10-09 NOTE — Progress Notes (Signed)
 PROGRESS NOTE  DEROLD DORSCH FMW:[id number] DOB: Jul 30, 1951 DOA: 09/27/2024 PCP: Antonetta Rollene BRAVO, MD   LOS: 12 days   Brief Narrative / Interim history: 73 year old male with history of HTN, HLD, hep C status posttreatment, advanced fibrosis was initially seen at [facility] on 11/24 for routine screening colonoscopy, he apparently aspirated during the procedure, monitored and discharged home on room air.  At home she has had worsening respiratory symptoms, was brought back requiring 15 L, respiratory status got worse and eventually ended up being intubated.  He developed presumed ARDS due to aspiration.  Incidentally he was found to be COVID-positive as well.  Eventually extubated, and transferred to the hospitalist service 12/3  Significant events: 11/24 -Colonoscopy at [facility] with Dr. Eartha Flavors, postop O2 requirements showing 93% on RA at discharge. 11/24- Trialed 15L NRB, BiPAP without respiratory improvement. PCCM consulted for admission and worsening respiratory status,  intubated in [facility] ED.  11/24- Left radial arterial line placed  11/24- 2030, CVC was placed for increased Levophed  requirements 11/28- Failed SBT, will switch to Precedex  for sedation to improve respiratory drive 87/8- Successfully extubated to 3L [place] 12/4 room air  Subjective / 24h Interval events: Eating breakfast, wife is at bedside assisting him with food  Assesement and Plan: Principal problem Acute hypoxic respiratory failure due to ARDS from aspiration pneumonia -requiring intubation initially.  He is status post 5 days of antibiotics, now completed.  He was eventually extubated on 12/1, respiratory status much better, was on 3 L yesterday but now he is on room air - PT recommends CIR, I think he is an appropriate candidate, appreciate input - He is eating more, yesterday he actually ate 3 meals, did not finish any of them but is a progress.  Will reevaluate later today and see if he  is appropriate for core Trac discontinuation this evening or tomorrow  Active problems Essential hypertension-continue losartan , blood pressure is acceptable  Acute systolic CHF -thought to be due to septic cardiomyopathy, shock.  2D echocardiogram showed LVEF 25 to 30%, global hypokinesis.  RV systolic function was normal.  He is on losartan , continue.  Anticipate recovery, will need repeat echo in 3 to 4 weeks as an outpatient  COVID-positive-likely incidental, his ARDS was due to aspiration given timing postanesthesia, never had any fever, chills, flulike illness so I wonder whether this is an older infection and the PCR was positive still.  Discontinue precautions today  History of hep C, advanced fibrosis -outpatient follow-up with GI  AKI-creatinine improved, creatinine most recently normalized  Hyperkalemia -status post Lokelma   DM2 -continue sliding scale  Deconditioning -in bed for the past 8 days due to intubation, ARDS, shock.  PT/OT recommending CIR, insurance authorization pending  Scheduled Meds:  Chlorhexidine  Gluconate Cloth  6 each Topical Daily   docusate  100 mg Per Tube BID   feeding supplement  237 mL Oral BID BM   feeding supplement (PROSource TF20)  60 mL Per Tube Daily   guaiFENesin   10 mL Per Tube BID   heparin   5,000 Units Subcutaneous Q8H   insulin  aspart  0-20 Units Subcutaneous Q4H   insulin  glargine-yfgn  8 Units Subcutaneous Daily   lidocaine   1 patch Transdermal Daily   losartan   100 mg Per Tube Daily   mouth rinse  15 mL Mouth Rinse TID   senna  1 tablet Per Tube QHS   Continuous Infusions:  feeding supplement (OSMOLITE 1.5 CAL) 1,000 mL (10/08/24 2155)  PRN Meds:.acetaminophen , hydrOXYzine , melatonin, mouth rinse  Current Outpatient Medications  Medication Instructions   losartan  (COZAAR ) 100 mg, Oral, Daily   meclizine  (ANTIVERT ) 25 mg, Oral, 2 times daily PRN   Misc. Devices MISC Blood pressure cuff/device - 1. ICD10: I10    spironolactone  (ALDACTONE ) 25 mg, Oral, Daily    Diet Orders (From admission, onward)     Start     Ordered   10/05/24 1256  DIET DYS 3 Room service appropriate? Yes with Assist; Fluid consistency: Thin  Diet effective now       Question Answer Comment  Room service appropriate? Yes with Assist   Fluid consistency: Thin      10/05/24 1255            DVT prophylaxis: heparin  injection 5,000 Units Start: 09/27/24 1630   Lab Results  Component Value Date   PLT 344 10/08/2024      Code Status: Full Code  Family Communication: Wife at bedside  Status is: Inpatient Remains inpatient appropriate because: Severity of illness   Level of care: Telemetry  Consultants:  PCCM  Objective: Vitals:   10/09/24 0015 10/09/24 0424 10/09/24 0500 10/09/24 0805  BP: 118/63 (!) 108/58  (!) 154/67  Pulse: (!) 56 (!) 58  60  Resp: 20 20  18   Temp: 98.8 F (37.1 C) 99.4 F (37.4 C)  (!) 97.5 F (36.4 C)  TempSrc: Oral Oral  Oral  SpO2: 96% 100%  99%  Weight:   76 kg   Height:        Intake/Output Summary (Last 24 hours) at 10/09/2024 1024 Last data filed at 10/08/2024 2203 Gross per 24 hour  Intake --  Output 580 ml  Net -580 ml   Wt Readings from Last 3 Encounters:  10/09/24 76 kg  09/27/24 81.2 kg  08/24/24 79.2 kg    Examination: Constitutional: NAD Eyes: lids and conjunctivae normal, no scleral icterus ENMT: mmm Neck: normal, supple Respiratory: clear to auscultation bilaterally, no wheezing, no crackles. Normal respiratory effort.  Cardiovascular: Regular rate and rhythm, no murmurs / rubs / gallops. No LE edema. Abdomen: soft, no distention, no tenderness. Bowel sounds positive.   Data Reviewed: I have independently reviewed following labs and imaging studies   CBC Recent Labs  Lab 10/03/24 0539 10/04/24 0500 10/05/24 0420 10/07/24 0846 10/08/24 0539  WBC 26.3* 22.4* 17.1* 13.8* 15.2*  HGB 11.3* 11.7* 12.2* 13.1 12.1*  HCT 34.2* 36.4* 37.6* 39.8  36.6*  PLT 137* 197 251 264 344  MCV 84.7 86.1 86.2 85.2 83.9  MCH 28.0 27.7 28.0 28.1 27.8  MCHC 33.0 32.1 32.4 32.9 33.1  RDW 13.7 14.0 14.2 14.3 14.2    Recent Labs  Lab 10/03/24 0539 10/04/24 0500 10/04/24 0950 10/05/24 0420 10/07/24 0846 10/08/24 0539  NA 141 146*  --  147* 134* 135  K 4.2 4.3  --  4.0 5.2* 4.5  CL 103 106  --  109 100 101  CO2 29 30  --  29 24 27   GLUCOSE 168* 162*  --  143* 104* 130*  BUN 52* 53*  --  53* 37* 32*  CREATININE 1.14 1.27*  --  1.05 0.86 0.90  CALCIUM  8.2* 8.5*  --  8.7* 8.6* 9.1  AST 34  --   --   --  55* 44*  ALT 29  --   --   --  72* 72*  ALKPHOS 92  --   --   --  68 65  BILITOT 0.6  --   --   --  1.0 0.4  ALBUMIN 2.2*  --   --   --  2.0* 2.0*  MG 2.4  --   --  2.7* 2.0 1.9  PROCALCITON  --   --  12.58  --   --   --     ------------------------------------------------------------------------------------------------------------------ No results for input(s): CHOL, HDL, LDLCALC, TRIG, CHOLHDL, LDLDIRECT in the last 72 hours.  Lab Results  Component Value Date   HGBA1C 5.5 09/16/2017   ------------------------------------------------------------------------------------------------------------------ No results for input(s): TSH, T4TOTAL, T3FREE, THYROIDAB in the last 72 hours.  Invalid input(s): FREET3  Cardiac Enzymes No results for input(s): CKMB, TROPONINI, MYOGLOBIN in the last 168 hours.  Invalid input(s): CK ------------------------------------------------------------------------------------------------------------------    Component Value Date/Time   BNP 251.0 (H) 09/28/2024 0238    CBG: Recent Labs  Lab 10/08/24 1726 10/08/24 2003 10/09/24 0016 10/09/24 0436 10/09/24 0807  GLUCAP 138* 146* 136* 145* 152*    No results found for this or any previous visit (from the past 240 hours).    Radiology Studies: No results found.   Nilda Fendt, MD, PhD [facility]  Between 7 am - 7 pm I am available, please contact me via Amion (for emergencies) or Securechat (non urgent messages)  Between 7 pm - 7 am I am not available, please contact night coverage MD/APP via Amion

## 2024-10-09 NOTE — Plan of Care (Signed)
  Problem: Education: Goal: Knowledge of General Education information will improve Description: Including pain rating scale, medication(s)/side effects and non-pharmacologic comfort measures Outcome: Progressing   Problem: Health Behavior/Discharge Planning: Goal: Ability to manage health-related needs will improve Outcome: Progressing   Problem: Clinical Measurements: Goal: Ability to maintain clinical measurements within normal limits will improve Outcome: Progressing Goal: Will remain free from infection Outcome: Progressing Goal: Diagnostic test results will improve Outcome: Progressing Goal: Respiratory complications will improve Outcome: Progressing Goal: Cardiovascular complication will be avoided Outcome: Progressing   Problem: Activity: Goal: Risk for activity intolerance will decrease Outcome: Progressing   Problem: Nutrition: Goal: Adequate nutrition will be maintained Outcome: Progressing   Problem: Coping: Goal: Level of anxiety will decrease Outcome: Progressing   Problem: Elimination: Goal: Will not experience complications related to bowel motility Outcome: Progressing Goal: Will not experience complications related to urinary retention Outcome: Progressing   Problem: Pain Managment: Goal: General experience of comfort will improve and/or be controlled Outcome: Progressing   Problem: Safety: Goal: Ability to remain free from injury will improve Outcome: Progressing   Problem: Skin Integrity: Goal: Risk for impaired skin integrity will decrease Outcome: Progressing   Problem: Education: Goal: Knowledge of risk factors and measures for prevention of condition will improve Outcome: Progressing   Problem: Coping: Goal: Psychosocial and spiritual needs will be supported Outcome: Progressing   Problem: Respiratory: Goal: Will maintain a patent airway Outcome: Progressing Goal: Complications related to the disease process, condition or treatment  will be avoided or minimized Outcome: Progressing   Problem: Activity: Goal: Ability to tolerate increased activity will improve Outcome: Progressing   Problem: Respiratory: Goal: Ability to maintain a clear airway and adequate ventilation will improve Outcome: Progressing   Problem: Role Relationship: Goal: Method of communication will improve Outcome: Progressing   Problem: Education: Goal: Ability to describe self-care measures that may prevent or decrease complications (Diabetes Survival Skills Education) will improve Outcome: Progressing Goal: Individualized Educational Video(s) Outcome: Progressing   Problem: Coping: Goal: Ability to adjust to condition or change in health will improve Outcome: Progressing   Problem: Fluid Volume: Goal: Ability to maintain a balanced intake and output will improve Outcome: Progressing   Problem: Health Behavior/Discharge Planning: Goal: Ability to identify and utilize available resources and services will improve Outcome: Progressing Goal: Ability to manage health-related needs will improve Outcome: Progressing   Problem: Metabolic: Goal: Ability to maintain appropriate glucose levels will improve Outcome: Progressing   Problem: Nutritional: Goal: Maintenance of adequate nutrition will improve Outcome: Progressing Goal: Progress toward achieving an optimal weight will improve Outcome: Progressing   Problem: Skin Integrity: Goal: Risk for impaired skin integrity will decrease Outcome: Progressing   Problem: Tissue Perfusion: Goal: Adequacy of tissue perfusion will improve Outcome: Progressing

## 2024-10-10 DIAGNOSIS — J80 Acute respiratory distress syndrome: Secondary | ICD-10-CM | POA: Diagnosis not present

## 2024-10-10 LAB — GLUCOSE, CAPILLARY
Glucose-Capillary: 88 mg/dL (ref 70–99)
Glucose-Capillary: 130 mg/dL — ABNORMAL HIGH (ref 70–99)

## 2024-10-10 MED ORDER — ACETAMINOPHEN 325 MG PO TABS
650.0000 mg | ORAL_TABLET | Freq: Four times a day (QID) | ORAL | Status: DC | PRN
  Administered 2024-10-11: 650 mg via ORAL
  Filled 2024-10-10: qty 2

## 2024-10-10 MED ORDER — HYDROXYZINE HCL 10 MG PO TABS: 10.0000 mg | ORAL_TABLET | Freq: Three times a day (TID) | ORAL | Status: DC | PRN

## 2024-10-10 MED ORDER — LOSARTAN POTASSIUM 50 MG PO TABS
100.0000 mg | ORAL_TABLET | Freq: Every day | ORAL | Status: DC
  Administered 2024-10-11 – 2024-10-12 (×2): 100 mg via ORAL
  Filled 2024-10-10 (×2): qty 2

## 2024-10-10 MED ORDER — GUAIFENESIN 100 MG/5ML PO LIQD
10.0000 mL | Freq: Two times a day (BID) | ORAL | Status: DC
  Administered 2024-10-10 – 2024-10-12 (×4): 10 mL via ORAL
  Filled 2024-10-10 (×4): qty 15

## 2024-10-10 NOTE — Plan of Care (Signed)
  Problem: Education: Goal: Knowledge of General Education information will improve Description: Including pain rating scale, medication(s)/side effects and non-pharmacologic comfort measures Outcome: Progressing   Problem: Clinical Measurements: Goal: Ability to maintain clinical measurements within normal limits will improve Outcome: Progressing   Problem: Clinical Measurements: Goal: Will remain free from infection Outcome: Progressing   Problem: Clinical Measurements: Goal: Diagnostic test results will improve Outcome: Progressing   Problem: Clinical Measurements: Goal: Respiratory complications will improve Outcome: Progressing   Problem: Clinical Measurements: Goal: Cardiovascular complication will be avoided Outcome: Progressing   Problem: Activity: Goal: Risk for activity intolerance will decrease Outcome: Progressing   Problem: Nutrition: Goal: Adequate nutrition will be maintained Outcome: Progressing   Problem: Coping: Goal: Level of anxiety will decrease Outcome: Progressing

## 2024-10-10 NOTE — Plan of Care (Signed)
  Problem: Education: Goal: Knowledge of General Education information will improve Description: Including pain rating scale, medication(s)/side effects and non-pharmacologic comfort measures Outcome: Progressing   Problem: Health Behavior/Discharge Planning: Goal: Ability to manage health-related needs will improve Outcome: Progressing   Problem: Clinical Measurements: Goal: Ability to maintain clinical measurements within normal limits will improve Outcome: Progressing Goal: Will remain free from infection Outcome: Progressing Goal: Diagnostic test results will improve Outcome: Progressing Goal: Respiratory complications will improve Outcome: Progressing Goal: Cardiovascular complication will be avoided Outcome: Progressing   Problem: Activity: Goal: Risk for activity intolerance will decrease Outcome: Progressing   Problem: Nutrition: Goal: Adequate nutrition will be maintained Outcome: Progressing   Problem: Coping: Goal: Level of anxiety will decrease Outcome: Progressing   Problem: Elimination: Goal: Will not experience complications related to bowel motility Outcome: Progressing Goal: Will not experience complications related to urinary retention Outcome: Progressing   Problem: Pain Managment: Goal: General experience of comfort will improve and/or be controlled Outcome: Progressing   Problem: Safety: Goal: Ability to remain free from injury will improve Outcome: Progressing   Problem: Skin Integrity: Goal: Risk for impaired skin integrity will decrease Outcome: Progressing   Problem: Education: Goal: Knowledge of risk factors and measures for prevention of condition will improve Outcome: Progressing   Problem: Coping: Goal: Psychosocial and spiritual needs will be supported Outcome: Progressing   Problem: Respiratory: Goal: Will maintain a patent airway Outcome: Progressing Goal: Complications related to the disease process, condition or treatment  will be avoided or minimized Outcome: Progressing   Problem: Activity: Goal: Ability to tolerate increased activity will improve Outcome: Progressing   Problem: Respiratory: Goal: Ability to maintain a clear airway and adequate ventilation will improve Outcome: Progressing   Problem: Role Relationship: Goal: Method of communication will improve Outcome: Progressing   Problem: Education: Goal: Ability to describe self-care measures that may prevent or decrease complications (Diabetes Survival Skills Education) will improve Outcome: Progressing Goal: Individualized Educational Video(s) Outcome: Progressing   Problem: Coping: Goal: Ability to adjust to condition or change in health will improve Outcome: Progressing   Problem: Fluid Volume: Goal: Ability to maintain a balanced intake and output will improve Outcome: Progressing   Problem: Health Behavior/Discharge Planning: Goal: Ability to identify and utilize available resources and services will improve Outcome: Progressing Goal: Ability to manage health-related needs will improve Outcome: Progressing   Problem: Metabolic: Goal: Ability to maintain appropriate glucose levels will improve Outcome: Progressing   Problem: Nutritional: Goal: Maintenance of adequate nutrition will improve Outcome: Progressing Goal: Progress toward achieving an optimal weight will improve Outcome: Progressing   Problem: Skin Integrity: Goal: Risk for impaired skin integrity will decrease Outcome: Progressing   Problem: Tissue Perfusion: Goal: Adequacy of tissue perfusion will improve Outcome: Progressing

## 2024-10-10 NOTE — Progress Notes (Signed)
 PROGRESS NOTE  Bryce Martinez FMW:[id number] DOB: [birth year] DOA: 09/27/2024 PCP: Antonetta Rollene BRAVO, MD   LOS: 13 days   Brief Narrative / Interim history: 73 year old male with history of HTN, HLD, hep C status posttreatment, advanced fibrosis was initially seen at [facility] on 11/24 for routine screening colonoscopy, he apparently aspirated during the procedure, monitored and discharged home on room air.  At home she has had worsening respiratory symptoms, was brought back requiring 15 L, respiratory status got worse and eventually ended up being intubated.  He developed presumed ARDS due to aspiration.  Incidentally he was found to be COVID-positive as well.  Eventually extubated, and transferred to the hospitalist service 12/3  Significant events: 11/24 -Colonoscopy at [facility] with Dr. Eartha Flavors, postop O2 requirements showing 93% on RA at discharge. 11/24- Trialed 15L NRB, BiPAP without respiratory improvement. PCCM consulted for admission and worsening respiratory status,  intubated in [facility] ED.  11/24- Left radial arterial line placed  11/24- 2030, CVC was placed for increased Levophed  requirements 11/28- Failed SBT, will switch to Precedex  for sedation to improve respiratory drive 87/8- Successfully extubated to 3L [place] 12/4 room air 12/6 core track discontinued  Subjective / 24h Interval events: Eating breakfast, has no complaints.  He is happy that the NG tube is out  Assesement and Plan: Principal problem Acute hypoxic respiratory failure due to ARDS from aspiration pneumonia -requiring intubation initially.  He is status post 5 days of antibiotics, now completed.  He was eventually extubated on 12/1, respiratory status much better, was on 3 L yesterday but now he is on room air - PT recommends CIR, awaiting insurance decision - He is eating more, core Trac discontinued last night  Active problems Essential hypertension-continue losartan , blood pressure  is stable  Acute systolic CHF -thought to be due to septic cardiomyopathy, shock.  2D echocardiogram showed LVEF 25 to 30%, global hypokinesis.  RV systolic function was normal.  He is on losartan , continue.  Anticipate recovery, will need repeat echo in 3 to 4 weeks as an outpatient.  Euvolemic  COVID-positive-likely incidental, his ARDS was due to aspiration given timing postanesthesia, never had any fever, chills, flulike illness so I wonder whether this is an older infection and the PCR was positive still.  Discontinue precautions today  History of hep C, advanced fibrosis -outpatient follow-up with GI  AKI-creatinine improved, creatinine most recently normalized  Hyperkalemia -status post Lokelma   DM2 -continue sliding scale  Deconditioning -in bed for the past 8 days due to intubation, ARDS, shock.  PT/OT recommending CIR, insurance authorization pending  Scheduled Meds:  Chlorhexidine  Gluconate Cloth  6 each Topical Daily   feeding supplement  237 mL Oral BID BM   guaiFENesin   10 mL Oral BID   heparin   5,000 Units Subcutaneous Q8H   insulin  aspart  0-20 Units Subcutaneous Q4H   insulin  glargine-yfgn  8 Units Subcutaneous Daily   lidocaine   1 patch Transdermal Daily   losartan   100 mg Per Tube Daily   mouth rinse  15 mL Mouth Rinse TID   Continuous Infusions:   PRN Meds:.acetaminophen , calcium  carbonate, hydrOXYzine , melatonin, menthol , mouth rinse  Current Outpatient Medications  Medication Instructions   losartan  (COZAAR ) 100 mg, Oral, Daily   meclizine  (ANTIVERT ) 25 mg, Oral, 2 times daily PRN   Misc. Devices MISC Blood pressure cuff/device - 1. ICD10: I10   spironolactone  (ALDACTONE ) 25 mg, Oral, Daily    Diet Orders (From admission, onward)  Start     Ordered   10/05/24 1256  DIET DYS 3 Room service appropriate? Yes with Assist; Fluid consistency: Thin  Diet effective now       Question Answer Comment  Room service appropriate? Yes with Assist   Fluid  consistency: Thin      10/05/24 1255            DVT prophylaxis: heparin  injection 5,000 Units Start: 09/27/24 1630   Lab Results  Component Value Date   PLT 344 10/08/2024      Code Status: Full Code  Family Communication: Wife at bedside  Status is: Inpatient Remains inpatient appropriate because: Severity of illness   Level of care: Telemetry  Consultants:  PCCM  Objective: Vitals:   10/10/24 0500 10/10/24 0513 10/10/24 0720 10/10/24 1139  BP:  128/69 136/75 133/72  Pulse:  (!) 56 64 65  Resp:  18    Temp:  98.1 F (36.7 C) 98 F (36.7 C) 98.2 F (36.8 C)  TempSrc:  Oral Oral   SpO2:  99% 100% 100%  Weight: 73.2 kg     Height:       No intake or output data in the 24 hours ending 10/10/24 1335  Wt Readings from Last 3 Encounters:  10/10/24 73.2 kg  09/27/24 81.2 kg  08/24/24 79.2 kg    Examination: Constitutional: NAD Eyes: lids and conjunctivae normal, no scleral icterus ENMT: mmm Neck: normal, supple Respiratory: clear to auscultation bilaterally, no wheezing, no crackles. Normal respiratory effort.  Cardiovascular: Regular rate and rhythm, no murmurs / rubs / gallops. No LE edema. Abdomen: soft, no distention, no tenderness. Bowel sounds positive.   Data Reviewed: I have independently reviewed following labs and imaging studies   CBC Recent Labs  Lab 10/04/24 0500 10/05/24 0420 10/07/24 0846 10/08/24 0539  WBC 22.4* 17.1* 13.8* 15.2*  HGB 11.7* 12.2* 13.1 12.1*  HCT 36.4* 37.6* 39.8 36.6*  PLT 197 251 264 344  MCV 86.1 86.2 85.2 83.9  MCH 27.7 28.0 28.1 27.8  MCHC 32.1 32.4 32.9 33.1  RDW 14.0 14.2 14.3 14.2    Recent Labs  Lab 10/04/24 0500 10/04/24 0950 10/05/24 0420 10/07/24 0846 10/08/24 0539  NA 146*  --  147* 134* 135  K 4.3  --  4.0 5.2* 4.5  CL 106  --  109 100 101  CO2 30  --  29 24 27   GLUCOSE 162*  --  143* 104* 130*  BUN 53*  --  53* 37* 32*  CREATININE 1.27*  --  1.05 0.86 0.90  CALCIUM  8.5*  --  8.7*  8.6* 9.1  AST  --   --   --  55* 44*  ALT  --   --   --  72* 72*  ALKPHOS  --   --   --  68 65  BILITOT  --   --   --  1.0 0.4  ALBUMIN  --   --   --  2.0* 2.0*  MG  --   --  2.7* 2.0 1.9  PROCALCITON  --  12.58  --   --   --     ------------------------------------------------------------------------------------------------------------------ No results for input(s): CHOL, HDL, LDLCALC, TRIG, CHOLHDL, LDLDIRECT in the last 72 hours.  Lab Results  Component Value Date   HGBA1C 5.5 09/16/2017   ------------------------------------------------------------------------------------------------------------------ No results for input(s): TSH, T4TOTAL, T3FREE, THYROIDAB in the last 72 hours.  Invalid input(s): FREET3  Cardiac Enzymes No results for input(s):  CKMB, TROPONINI, MYOGLOBIN in the last 168 hours.  Invalid input(s): CK ------------------------------------------------------------------------------------------------------------------    Component Value Date/Time   BNP 251.0 (H) 09/28/2024 0238    CBG: Recent Labs  Lab 10/09/24 1133 10/09/24 1728 10/09/24 2003 10/09/24 2347 10/10/24 1149  GLUCAP 177* 145* 117* 100* 88    No results found for this or any previous visit (from the past 240 hours).    Radiology Studies: No results found.   Nilda Fendt, MD, PhD [facility]  Between 7 am - 7 pm I am available, please contact me via Amion (for emergencies) or Securechat (non urgent messages)  Between 7 pm - 7 am I am not available, please contact night coverage MD/APP via Amion

## 2024-10-11 DIAGNOSIS — J80 Acute respiratory distress syndrome: Principal | ICD-10-CM

## 2024-10-11 LAB — GLUCOSE, CAPILLARY
Glucose-Capillary: 100 mg/dL — ABNORMAL HIGH (ref 70–99)
Glucose-Capillary: 104 mg/dL — ABNORMAL HIGH (ref 70–99)
Glucose-Capillary: 106 mg/dL — ABNORMAL HIGH (ref 70–99)
Glucose-Capillary: 114 mg/dL — ABNORMAL HIGH (ref 70–99)
Glucose-Capillary: 84 mg/dL (ref 70–99)
Glucose-Capillary: 87 mg/dL (ref 70–99)
Glucose-Capillary: 93 mg/dL (ref 70–99)
Glucose-Capillary: 98 mg/dL (ref 70–99)

## 2024-10-11 MED ORDER — KATE FARMS STANDARD 1.4 EN LIQD
325.0000 mL | Freq: Two times a day (BID) | ENTERAL | Status: DC
  Administered 2024-10-12 (×2): 325 mL via ORAL
  Filled 2024-10-11 (×3): qty 325

## 2024-10-11 MED ORDER — SENNOSIDES-DOCUSATE SODIUM 8.6-50 MG PO TABS
2.0000 | ORAL_TABLET | Freq: Two times a day (BID) | ORAL | Status: DC
  Administered 2024-10-11 – 2024-10-12 (×2): 2 via ORAL
  Filled 2024-10-11 (×2): qty 2

## 2024-10-11 NOTE — Progress Notes (Signed)
 Occupational Therapy Treatment Patient Details Name: Bryce Martinez MRN: [id number] DOB: Feb 22, 1951 Today's Date: 10/11/2024   History of present illness 73 yo male admitted 11/24 with ARDS due to aspiration PNA after witnessed emesis during routine colonscopy at [facility]. intubated 11/24-12/1  COVID (+) PMH: OA, GERD, Hep C with advanced fibrosis, HLD, HTN, R TSR DM2, smoker   OT comments  Pt progressing toward goals this session, overall needs up to min A for ADLs, min A for bed mobility and CGA for transfers with RW, cues for hand placement to push from bed. Pt with flat affect overall, mild impaired cognition noted while dual-tasking. Pt presenting with impairments listed below, will follow acutely. Patient will benefit from intensive inpatient follow-up therapy, >3 hours/day.       If plan is discharge home, recommend the following:  Two people to help with walking and/or transfers;Two people to help with bathing/dressing/bathroom;Assistance with cooking/housework;Assistance with feeding;Assist for transportation;Help with stairs or ramp for entrance   Equipment Recommendations  BSC/3in1;Tub/shower bench;Other (comment) (RW)    Recommendations for Other Services Rehab consult    Precautions / Restrictions Precautions Precautions: Fall Restrictions Weight Bearing Restrictions Per Provider Order: No       Mobility Bed Mobility Overal bed mobility: Needs Assistance Bed Mobility: Supine to Sit, Sit to Supine     Supine to sit: Min assist Sit to supine: Contact guard assist   General bed mobility comments: assist for trunk elevation    Transfers Overall transfer level: Needs assistance Equipment used: Rolling walker (2 wheels) Transfers: Sit to/from Stand Sit to Stand: From elevated surface, Contact guard assist                 Balance Overall balance assessment: Needs assistance Sitting-balance support: Single extremity supported, Feet supported, No upper  extremity supported Sitting balance-Leahy Scale: Good     Standing balance support: During functional activity, Reliant on assistive device for balance, Single extremity supported Standing balance-Leahy Scale: Poor                             ADL either performed or assessed with clinical judgement   ADL                   Upper Body Dressing : Minimal assistance;Sitting   Lower Body Dressing: Minimal assistance;Sitting/lateral leans   Toilet Transfer: Contact guard assist;Ambulation;Rolling walker (2 wheels)           Functional mobility during ADLs: Contact guard assist;Rolling walker (2 wheels)      Extremity/Trunk Assessment Upper Extremity Assessment RUE Deficits / Details: hx of rotator cuff injury with repair several years ago with mildly decreased shoulder AROM at baseline secondary to this; AROM otherwise WFL; generalized weakness; decreased coordination but with noted improvements as compared to OT eval on 12/3 LUE Deficits / Details: AROM shoulder flexion to approximately 85 degrees and AAROM WFL; AROM otherwise WFL; generalized weakness; decreased coordination but with noted improvements since OT eval on 12/3   Lower Extremity Assessment Lower Extremity Assessment: Defer to PT evaluation        Vision   Vision Assessment?: No apparent visual deficits   Perception Perception Perception: Not tested   Praxis Praxis Praxis: Not tested   Communication Communication Communication: Impaired Factors Affecting Communication: Reduced clarity of speech (soft voice)   Cognition Arousal: Alert Behavior During Therapy: Flat affect Cognition: Cognition impaired       Memory impairment (  select all impairments): Short-term memory   Executive functioning impairment (select all impairments): Problem solving OT - Cognition Comments: difficulty recalling 1/3 words after a few mins, unable to perform simple math task                 Following  commands: Intact        Cueing   Cueing Techniques: Verbal cues  Exercises      Shoulder Instructions       General Comments HR 80s    Pertinent Vitals/ Pain       Pain Assessment Pain Assessment: No/denies pain  Home Living                                          Prior Functioning/Environment              Frequency  Min 2X/week        Progress Toward Goals  OT Goals(current goals can now be found in the care plan section)  Progress towards OT goals: Progressing toward goals  Acute Rehab OT Goals Patient Stated Goal: none stated OT Goal Formulation: With patient Time For Goal Achievement: 10/20/24 Potential to Achieve Goals: Good ADL Goals Pt Will Perform Grooming: with contact guard assist;standing Pt Will Perform Upper Body Bathing: with contact guard assist;sitting Pt Will Perform Lower Body Dressing: with contact guard assist;sitting/lateral leans;sit to/from stand Pt Will Transfer to Toilet: with contact guard assist;ambulating;regular height toilet;grab bars Pt Will Perform Toileting - Clothing Manipulation and hygiene: with contact guard assist;sit to/from stand Pt Will Perform Tub/Shower Transfer: Tub transfer;with contact guard assist;ambulating;tub bench Additional ADL Goal #1: Patient will demonstrate ability to participate in 5 or more minutes of a functional or therapeutic activity without the need for a rest break to increase safety and independence with functional tasks.  Plan      Co-evaluation                 AM-PAC OT 6 Clicks Daily Activity     Outcome Measure   Help from another person eating meals?: A Little Help from another person taking care of personal grooming?: A Little Help from another person toileting, which includes using toliet, bedpan, or urinal?: A Little Help from another person bathing (including washing, rinsing, drying)?: A Lot Help from another person to put on and taking off regular  upper body clothing?: A Little Help from another person to put on and taking off regular lower body clothing?: A Little 6 Click Score: 17    End of Session Equipment Utilized During Treatment: Gait belt;Rolling walker (2 wheels)  OT Visit Diagnosis: Unsteadiness on feet (R26.81);Other abnormalities of gait and mobility (R26.89);Muscle weakness (generalized) (M62.81)   Activity Tolerance Patient tolerated treatment well   Patient Left in bed;with call bell/phone within reach;with family/visitor present   Nurse Communication Mobility status (ok with bed alarm off)        Time: 1430-1447 OT Time Calculation (min): 17 min  Charges: OT General Charges $OT Visit: 1 Visit OT Treatments $Self Care/Home Management : 8-22 mins  Yale Golla K, OTD, OTR/L SecureChat Preferred Acute Rehab [phone]   Laneta POUR Koonce 10/11/2024, 2:58 PM

## 2024-10-11 NOTE — Plan of Care (Signed)
  Problem: Respiratory: Goal: Ability to maintain a clear airway and adequate ventilation will improve Outcome: Progressing   Problem: Coping: Goal: Ability to adjust to condition or change in health will improve Outcome: Progressing   Problem: Metabolic: Goal: Ability to maintain appropriate glucose levels will improve Outcome: Progressing

## 2024-10-11 NOTE — Plan of Care (Signed)
  Problem: Education: Goal: Knowledge of General Education information will improve Description: Including pain rating scale, medication(s)/side effects and non-pharmacologic comfort measures Outcome: Progressing   Problem: Health Behavior/Discharge Planning: Goal: Ability to manage health-related needs will improve Outcome: Progressing   Problem: Clinical Measurements: Goal: Ability to maintain clinical measurements within normal limits will improve Outcome: Progressing Goal: Will remain free from infection Outcome: Progressing Goal: Diagnostic test results will improve Outcome: Progressing Goal: Respiratory complications will improve Outcome: Progressing Goal: Cardiovascular complication will be avoided Outcome: Progressing   Problem: Activity: Goal: Risk for activity intolerance will decrease Outcome: Progressing   Problem: Nutrition: Goal: Adequate nutrition will be maintained Outcome: Progressing   Problem: Coping: Goal: Level of anxiety will decrease Outcome: Progressing   Problem: Elimination: Goal: Will not experience complications related to bowel motility Outcome: Progressing Goal: Will not experience complications related to urinary retention Outcome: Progressing   Problem: Pain Managment: Goal: General experience of comfort will improve and/or be controlled Outcome: Progressing   Problem: Safety: Goal: Ability to remain free from injury will improve Outcome: Progressing   Problem: Skin Integrity: Goal: Risk for impaired skin integrity will decrease Outcome: Progressing   Problem: Education: Goal: Knowledge of risk factors and measures for prevention of condition will improve Outcome: Progressing   Problem: Coping: Goal: Psychosocial and spiritual needs will be supported Outcome: Progressing   Problem: Respiratory: Goal: Will maintain a patent airway Outcome: Progressing Goal: Complications related to the disease process, condition or treatment  will be avoided or minimized Outcome: Progressing   Problem: Activity: Goal: Ability to tolerate increased activity will improve Outcome: Progressing   Problem: Respiratory: Goal: Ability to maintain a clear airway and adequate ventilation will improve Outcome: Progressing   Problem: Role Relationship: Goal: Method of communication will improve Outcome: Progressing   Problem: Education: Goal: Ability to describe self-care measures that may prevent or decrease complications (Diabetes Survival Skills Education) will improve Outcome: Progressing Goal: Individualized Educational Video(s) Outcome: Progressing   Problem: Coping: Goal: Ability to adjust to condition or change in health will improve Outcome: Progressing   Problem: Fluid Volume: Goal: Ability to maintain a balanced intake and output will improve Outcome: Progressing   Problem: Health Behavior/Discharge Planning: Goal: Ability to identify and utilize available resources and services will improve Outcome: Progressing Goal: Ability to manage health-related needs will improve Outcome: Progressing   Problem: Metabolic: Goal: Ability to maintain appropriate glucose levels will improve Outcome: Progressing   Problem: Nutritional: Goal: Maintenance of adequate nutrition will improve Outcome: Progressing Goal: Progress toward achieving an optimal weight will improve Outcome: Progressing   Problem: Skin Integrity: Goal: Risk for impaired skin integrity will decrease Outcome: Progressing   Problem: Tissue Perfusion: Goal: Adequacy of tissue perfusion will improve Outcome: Progressing

## 2024-10-11 NOTE — Progress Notes (Signed)
 PROGRESS NOTE  Bryce Martinez FMW:[id number] DOB: 10/22/1951 DOA: 09/27/2024 PCP: Antonetta Rollene BRAVO, MD   LOS: 14 days   Brief Narrative / Interim history: 73 year old male with history of HTN, HLD, hep C status posttreatment, advanced fibrosis was initially seen at [facility] on 11/24 for routine screening colonoscopy, he apparently aspirated during the procedure, monitored and discharged home on room air.  At home she has had worsening respiratory symptoms, was brought back requiring 15 L, respiratory status got worse and eventually ended up being intubated.  He developed presumed ARDS due to aspiration.  Incidentally he was found to be COVID-positive as well.  Eventually extubated, and transferred to the hospitalist service 12/3  Significant events: 11/24 -Colonoscopy at [facility] with Dr. Eartha Flavors, postop O2 requirements showing 93% on RA at discharge. 11/24- Trialed 15L NRB, BiPAP without respiratory improvement. PCCM consulted for admission and worsening respiratory status,  intubated in [facility] ED.  11/24- Left radial arterial line placed  11/24- 2030, CVC was placed for increased Levophed  requirements 11/28- Failed SBT, will switch to Precedex  for sedation to improve respiratory drive 87/8- Successfully extubated to 3L [place] 12/4 room air 12/6 core track discontinued 12/8 insurance denied CIR. Patient appealing  Subjective / 24h Interval events: Doing well, no complaints   Assesement and Plan: Principal problem Acute hypoxic respiratory failure due to ARDS from aspiration pneumonia -requiring intubation initially.  He is status post 5 days of antibiotics, now completed.  He was eventually extubated on 12/1, respiratory status much better, was on 3 L yesterday but now he is on room air - PT recommends CIR, insurance denied, appeal in process - continues to do well with eating    Active problems Essential hypertension-continue losartan , blood pressure is  stable  Acute systolic CHF -thought to be due to septic cardiomyopathy, shock.  2D echocardiogram showed LVEF 25 to 30%, global hypokinesis.  RV systolic function was normal.  He is on losartan , continue.  Anticipate recovery, will need repeat echo in 3 to 4 weeks as an outpatient.  Euvolemic  COVID-positive-likely incidental, his ARDS was due to aspiration given timing postanesthesia, never had any fever, chills, flulike illness so I wonder whether this is an older infection and the PCR was positive still. No longer needs precautions   History of hep C, advanced fibrosis -outpatient follow-up with GI  AKI-creatinine improved, creatinine most recently normalized  Hyperkalemia -status post Lokelma   DM2 -continue sliding scale  Deconditioning -in bed for the past 8 days due to intubation, ARDS, shock.  Placement pending   Scheduled Meds:  Chlorhexidine  Gluconate Cloth  6 each Topical Daily   feeding supplement  237 mL Oral BID BM   guaiFENesin   10 mL Oral BID   heparin   5,000 Units Subcutaneous Q8H   insulin  aspart  0-20 Units Subcutaneous Q4H   insulin  glargine-yfgn  8 Units Subcutaneous Daily   lidocaine   1 patch Transdermal Daily   losartan   100 mg Oral Daily   mouth rinse  15 mL Mouth Rinse TID   Continuous Infusions:   PRN Meds:.acetaminophen , calcium  carbonate, hydrOXYzine , melatonin, menthol , mouth rinse  Current Outpatient Medications  Medication Instructions   losartan  (COZAAR ) 100 mg, Oral, Daily   meclizine  (ANTIVERT ) 25 mg, Oral, 2 times daily PRN   Misc. Devices MISC Blood pressure cuff/device - 1. ICD10: I10   spironolactone  (ALDACTONE ) 25 mg, Oral, Daily    Diet Orders (From admission, onward)     Start  Ordered   10/05/24 1256  DIET DYS 3 Room service appropriate? Yes with Assist; Fluid consistency: Thin  Diet effective now       Question Answer Comment  Room service appropriate? Yes with Assist   Fluid consistency: Thin      10/05/24 1255             DVT prophylaxis: heparin  injection 5,000 Units Start: 09/27/24 1630   Lab Results  Component Value Date   PLT 344 10/08/2024      Code Status: Full Code  Family Communication: Wife at bedside  Status is: Inpatient Remains inpatient appropriate because: Severity of illness   Level of care: Telemetry  Consultants:  PCCM  Objective: Vitals:   10/11/24 0339 10/11/24 0426 10/11/24 0459 10/11/24 0809  BP:  105/88  119/69  Pulse:  61  72  Resp:    18  Temp:    97.8 F (36.6 C)  TempSrc:    Oral  SpO2:  98%  99%  Weight: 74.3 kg  72 kg   Height:        Intake/Output Summary (Last 24 hours) at 10/11/2024 1321 Last data filed at 10/10/2024 2000 Gross per 24 hour  Intake --  Output 300 ml  Net -300 ml    Wt Readings from Last 3 Encounters:  10/11/24 72 kg  09/27/24 81.2 kg  08/24/24 79.2 kg    Examination: Constitutional: NAD Eyes: lids and conjunctivae normal, no scleral icterus ENMT: mmm Neck: normal, supple Respiratory: clear to auscultation bilaterally, no wheezing, no crackles. Normal respiratory effort.  Cardiovascular: Regular rate and rhythm, no murmurs / rubs / gallops. No LE edema. Abdomen: soft, no distention, no tenderness. Bowel sounds positive.   Data Reviewed: I have independently reviewed following labs and imaging studies   CBC Recent Labs  Lab 10/05/24 0420 10/07/24 0846 10/08/24 0539  WBC 17.1* 13.8* 15.2*  HGB 12.2* 13.1 12.1*  HCT 37.6* 39.8 36.6*  PLT 251 264 344  MCV 86.2 85.2 83.9  MCH 28.0 28.1 27.8  MCHC 32.4 32.9 33.1  RDW 14.2 14.3 14.2    Recent Labs  Lab 10/05/24 0420 10/07/24 0846 10/08/24 0539  NA 147* 134* 135  K 4.0 5.2* 4.5  CL 109 100 101  CO2 29 24 27   GLUCOSE 143* 104* 130*  BUN 53* 37* 32*  CREATININE 1.05 0.86 0.90  CALCIUM  8.7* 8.6* 9.1  AST  --  55* 44*  ALT  --  72* 72*  ALKPHOS  --  68 65  BILITOT  --  1.0 0.4  ALBUMIN  --  2.0* 2.0*  MG 2.7* 2.0 1.9     ------------------------------------------------------------------------------------------------------------------ No results for input(s): CHOL, HDL, LDLCALC, TRIG, CHOLHDL, LDLDIRECT in the last 72 hours.  Lab Results  Component Value Date   HGBA1C 5.5 09/16/2017   ------------------------------------------------------------------------------------------------------------------ No results for input(s): TSH, T4TOTAL, T3FREE, THYROIDAB in the last 72 hours.  Invalid input(s): FREET3  Cardiac Enzymes No results for input(s): CKMB, TROPONINI, MYOGLOBIN in the last 168 hours.  Invalid input(s): CK ------------------------------------------------------------------------------------------------------------------    Component Value Date/Time   BNP 251.0 (H) 09/28/2024 0238    CBG: Recent Labs  Lab 10/10/24 2119 10/11/24 0022 10/11/24 0425 10/11/24 0808 10/11/24 1247  GLUCAP 130* 84 87 106* 104*    No results found for this or any previous visit (from the past 240 hours).    Radiology Studies: No results found.   Nilda Fendt, MD, PhD [facility]  Between 7 am -  7 pm I am available, please contact me via Amion (for emergencies) or Securechat (non urgent messages)  Between 7 pm - 7 am I am not available, please contact night coverage MD/APP via Amion

## 2024-10-11 NOTE — Progress Notes (Signed)
 Inpatient Rehab Admissions Coordinator:   Notified by [facility] that request for CIR was denied after peer to peer with Dr. Carilyn.  I spoke to pt/family on the phone and they would like to appeal with [facility] Medicare.  Also mentioned trying to use pt's [facility] for auth.  I'm not able to see documentation that VA was notified of admission but pt's spouse said they were.  I will work on both of these routes simultaneously to hopefully expedite a decision.   Reche Lowers, PT, DPT Admissions Coordinator [phone] 10/11/24 12:50 PM

## 2024-10-11 NOTE — TOC Progression Note (Signed)
 Transition of Care [facility]) - Progression Note    Patient Details  Name: CHRISOPHER PUSTEJOVSKY MRN: [id number] Date of Birth: [birth year]  Transition of [facility]) CM/SW Contact  Rosaline JONELLE Joe, RN Phone Number: 10/11/2024, 9:39 AM  Clinical Narrative:    CM spoke with [facility] secretary and she states that she received a message for peer to peer to be completed by 12 noon today - [phone] option 5 - [place], CM with [facility] aware and states that rehabilitation MD is working on peer-to-peer.   Expected Discharge Plan: IP Rehab Facility Barriers to Discharge: Continued Medical Work up, [employer] and Services   Discharge Planning Services: CM Consult   Living arrangements for the past 2 months: Single Family Home                                       Social Drivers of Health (SDOH) Interventions SDOH Screenings   Food Insecurity: No Food Insecurity (09/29/2024)  Housing: Low Risk  (09/29/2024)  Transportation Needs: No Transportation Needs (09/29/2024)  Utilities: Not At Risk (09/29/2024)  Alcohol Screen: Low Risk  (06/28/2024)  Depression (PHQ2-9): Low Risk  (08/24/2024)  Financial Resource Strain: Low Risk  (06/28/2024)  Physical Activity: Insufficiently Active (06/28/2024)  Social Connections: Moderately Integrated (09/29/2024)  Stress: No Stress Concern Present (06/28/2024)  Tobacco Use: Medium Risk (09/27/2024)  Health Literacy: Adequate Health Literacy (06/29/2024)    Readmission Risk Interventions     No data to display

## 2024-10-11 NOTE — Progress Notes (Signed)
 Physical Therapy Treatment Patient Details Name: Bryce Martinez MRN: [id number] DOB: 1950/11/17 Today's Date: 10/11/2024   History of Present Illness 73 yo male admitted 11/24 with ARDS due to aspiration PNA after witnessed emesis during routine colonscopy at [facility]. intubated 11/24-12/1  COVID (+) PMH: OA, GERD, Hep C with advanced fibrosis, HLD, HTN, R TSR DM2, smoker    PT Comments  Making progress towards acute functional goals. Functional capacity improving with ability to ambulate up to 60 feet today. Requires up to min assist for RW control, and min assist for balance (increased lateral instability,) when training without AD. HR into 90s with exertion. Tolerated LE exercises well. CGA for bed mobility and transfers. Patient will continue to benefit from skilled physical therapy services to further improve independence with functional mobility.     If plan is discharge home, recommend the following: A little help with walking and/or transfers;A little help with bathing/dressing/bathroom;Assistance with cooking/housework;Assist for transportation   Can travel by private vehicle        Equipment Recommendations  Rolling walker (2 wheels)    Recommendations for Other Services Rehab consult     Precautions / Restrictions Precautions Precautions: Fall;Other (comment) Recall of Precautions/Restrictions: Intact Precaution/Restrictions Comments: coretrak Restrictions Weight Bearing Restrictions Per Provider Order: No     Mobility  Bed Mobility Overal bed mobility: Needs Assistance Bed Mobility: Supine to Sit, Sit to Supine     Supine to sit: HOB elevated, Used rails, Mod assist Sit to supine: Contact guard assist   General bed mobility comments: CGA for safety with cues for rail use, HOB elevated, and extra time. Did not require physical assist to rise with HOB elevated today.    Transfers Overall transfer level: Needs assistance Equipment used: Rolling walker (2  wheels) Transfers: Sit to/from Stand Sit to Stand: From elevated surface, Contact guard assist           General transfer comment: CGA for safety, practiced multiple times from high to low surfaces. Cues for hand placement, transitions to RW effectively with close guard.    Ambulation/Gait Ambulation/Gait assistance: Min assist Gait Distance (Feet): 60 Feet Assistive device: Rolling walker (2 wheels), 1 person hand held assist Gait Pattern/deviations: Trunk flexed, Narrow base of support, Step-through pattern, Decreased stride length, Drifts right/left Gait velocity: slowed Gait velocity interpretation: <1.31 ft/sec, indicative of household ambulator Pre-gait activities: Engineer, Water march with RW, CGA General Gait Details: Up to min assist for RW control at times. Slow, rigid and requires cues to widen BOS. Further distance limited by complaints of fatigue and weakness. Final 12 feet completed without AD, requiring UE support from therapist for balance with notable lateral instability. Cues for safety and awareness.   Stairs             Wheelchair Mobility     Tilt Bed    Modified Rankin (Stroke Patients Only)       Balance Overall balance assessment: Needs assistance Sitting-balance support: Single extremity supported, Feet supported, No upper extremity supported Sitting balance-Leahy Scale: Fair     Standing balance support: During functional activity, Reliant on assistive device for balance, Single extremity supported Standing balance-Leahy Scale: Poor Standing balance comment: Reliant on at least single UE support today                            Communication Communication Communication: Impaired Factors Affecting Communication: Reduced clarity of speech (soft voice)  Cognition Arousal: Alert Behavior During  Therapy: Flat affect   PT - Cognitive impairments: No apparent impairments                         Following commands: Intact       Cueing Cueing Techniques: Verbal cues  Exercises General Exercises - Lower Extremity Ankle Circles/Pumps: AROM, Both, 10 reps, Seated Gluteal Sets: Strengthening, Both, 10 reps, Seated Long Arc Quad: Strengthening, Both, 10 reps, Seated Other Exercises Other Exercises: Sit<>stand with hands on back of chair for support to rise.    General Comments        Pertinent Vitals/Pain Pain Assessment Pain Assessment: No/denies pain    Home Living                          Prior Function            PT Goals (current goals can now be found in the care plan section) Acute Rehab PT Goals Patient Stated Goal: home PT Goal Formulation: With patient/family Time For Goal Achievement: 10/20/24 Potential to Achieve Goals: Good Progress towards PT goals: Progressing toward goals    Frequency    Min 3X/week      PT Plan      Co-evaluation              AM-PAC PT 6 Clicks Mobility   Outcome Measure  Help needed turning from your back to your side while in a flat bed without using bedrails?: A Little Help needed moving from lying on your back to sitting on the side of a flat bed without using bedrails?: A Little Help needed moving to and from a bed to a chair (including a wheelchair)?: A Little Help needed standing up from a chair using your arms (e.g., wheelchair or bedside chair)?: A Little Help needed to walk in hospital room?: A Little Help needed climbing 3-5 steps with a railing? : A Lot 6 Click Score: 17    End of Session Equipment Utilized During Treatment: Gait belt Activity Tolerance: Patient tolerated treatment well Patient left: with call bell/phone within reach;with family/visitor present;in bed;with bed alarm set   PT Visit Diagnosis: Other abnormalities of gait and mobility (R26.89);Muscle weakness (generalized) (M62.81);Difficulty in walking, not elsewhere classified (R26.2);Unsteadiness on feet (R26.81)     Time: 8659-8643 PT Time  Calculation (min) (ACUTE ONLY): 16 min  Charges:    $Gait Training: 8-22 mins PT General Charges $$ ACUTE PT VISIT: 1 Visit                     Leontine Roads, PT, DPT [facility]  Rehabilitation Services Physical Therapist Office: [phone] Website: [email or web address]    Leontine GORMAN Roads 10/11/2024, 2:13 PM

## 2024-10-11 NOTE — Progress Notes (Signed)
 Nutrition Follow-up  DOCUMENTATION CODES:  Not applicable  INTERVENTION:  Change Ensure to [employer] 1.4 PO BID. Each supplement provides 455 Kcals and 20 grams protein. Patient suspects he might be highly sensitive to lactose. Continue Dysphagia 3 diet with thin liquids.   NUTRITION DIAGNOSIS:  Increased nutrient needs related to acute illness as evidenced by estimated needs - remains applicable  GOAL:  Patient will meet greater than or equal to 90% of their needs - progressing  MONITOR:  PO intake, Supplement acceptance, Diet advancement, Labs, Weight trends  REASON FOR ASSESSMENT:  Follow-up for: Consult Enteral/tube feeding initiation and management  ASSESSMENT:  Patient presented after aspirating at routine coloscopy and was found to have ARDS requiring intubation. PMH DM2, HTN, HLD, GERD, Hepatitis C, and advanced fibrosis.  11/24 Patient presented to [facility] ED after aspirating at routine colonoscopy, was intubated, and transferred to [facility] ICU . 11/26 Trickle TF initiated  11/28 Cortrak placed 12/01 Extubated 12/02 SLP swallow eval = DYS3/thin 12/05 FEES shows mild dysphagia, SLP recommends mech soft with thin liquids, OK to upgrade to reg/thin when patient desires; patient with very poor PO intake stating nothing tastes good here at hospital; switched to nocturnal feedings; wife to bring food 12/06 attending discontinued Cortrak 12/08 CIR denied by insurance, patient appealing  Update: Visited the patient with his wife [name] at bedside. [name] tells me the patient had 50-75% of meatloaf lunch with potatoes yesterday, then about 50% of chicken with rice and gravy dinner but was distracted with family visiting. Today, he ate about 75% of breakfast consisting of eggs, sausage and potatoes. He at about 25% of his turkey sandwich for lunch and 50% of soup and c/o feeling bloated and distended. He is hoping to feel better after a BM, which he is currently  attempting on the toilet. They suspect the patient might be highly intolerant of lactose and he has been finishing 2 Ensures daily and feeling bloated. We will trial [employer] instead.  Scheduled Meds:  Chlorhexidine  Gluconate Cloth  6 each Topical Daily   feeding supplement  237 mL Oral BID BM   guaiFENesin   10 mL Oral BID   heparin   5,000 Units Subcutaneous Q8H   insulin  aspart  0-20 Units Subcutaneous Q4H   insulin  glargine-yfgn  8 Units Subcutaneous Daily   lidocaine   1 patch Transdermal Daily   losartan   100 mg Oral Daily   mouth rinse  15 mL Mouth Rinse TID    Diet Order             DIET DYS 3 Room service appropriate? Yes with Assist; Fluid consistency: Thin  Diet effective now                  Meal Intake: 50-75% per patient's wife  Labs:     Latest Ref Rng & Units 10/08/2024    5:39 AM 10/07/2024    8:46 AM 10/05/2024    4:20 AM  CMP  Glucose 70 - 99 mg/dL 869  895  856   BUN 8 - 23 mg/dL 32  37  53   Creatinine 0.61 - 1.24 mg/dL 9.09  9.13  8.94   Sodium 135 - 145 mmol/L 135  134  147   Potassium 3.5 - 5.1 mmol/L 4.5  5.2  4.0   Chloride 98 - 111 mmol/L 101  100  109   CO2 22 - 32 mmol/L 27  24  29    Calcium  8.9 - 10.3  mg/dL 9.1  8.6  8.7   Total Protein 6.5 - 8.1 g/dL 6.0  5.5    Total Bilirubin 0.0 - 1.2 mg/dL 0.4  1.0    Alkaline Phos 38 - 126 U/L 65  68    AST 15 - 41 U/L 44  55    ALT 0 - 44 U/L 72  72     I/O: -360 mL since admit  NUTRITION - FOCUSED PHYSICAL EXAM: Flowsheet Row Most Recent Value  Orbital Region Mild depletion  Upper Arm Region No depletion  Thoracic and Lumbar Region No depletion  Buccal Region No depletion  Temple Region Mild depletion  Clavicle Bone Region No depletion  Clavicle and Acromion Bone Region Mild depletion  Scapular Bone Region No depletion  Dorsal Hand No depletion  Patellar Region No depletion  Anterior Thigh Region No depletion  Posterior Calf Region No depletion  Edema (RD Assessment) None  Hair  Reviewed  Eyes Reviewed  Mouth Reviewed  Skin Reviewed  Nails Reviewed    EDUCATION NEEDS:  Education needs have been addressed  Skin:  Skin Assessment: Reviewed RN Assessment  Last BM:  12/6 type 7  Height:  Ht Readings from Last 1 Encounters:  09/27/24 6' 1 (1.854 m)   Weight:  Wt Readings from Last 10 Encounters:  10/11/24 72 kg  09/27/24 81.2 kg  08/24/24 79.2 kg  06/29/24 79.4 kg  05/21/24 79.4 kg  01/05/24 82.4 kg  10/22/23 79.9 kg  05/20/23 77.6 kg  01/06/23 79.8 kg  11/19/22 79.9 kg   Weight Change: 4 Kg (5%) loss in 1 week = clinically significant but possibly due to improvement of edema during this admission, will continue to monitor  Edema: non-pitting generalized  Usual Body Weight: patient's wife reported stable weight PTA  Ideal Body Weight:  83.6 kg   BMI:  Body mass index is 20.94 kg/m.  Estimated Nutritional Needs:  Kcal:  2200-2500 kcal/d Protein:  110-125g/d Fluid:  2.5L/d    Leverne Ruth, MS, RDN, LDN [place]. [facility] See AMION for contact information Secure chat preferred

## 2024-10-11 NOTE — TOC Transition Note (Signed)
 Transition of Care [facility]) - Discharge Note   Patient Details  Name: Bryce Martinez MRN: [id number] Date of Birth: [birth year]  [facility] CM/SW Contact:  Sherline Clack, LCSWA Phone Number: 10/11/2024, 11:31 AM   Clinical Narrative:     CSW received phone call form Kate with [facility] informing CSW patient's authorization for inpatient rehab was denied. CSW reached out to Edmonia Lowers to provide an update and appeal information (p: [phone], f: [phone]). CSW will continue to follow as needs arise.     Barriers to Discharge: Continued Medical Work up, English As A Second Language Teacher   Patient Goals and CMS Choice            Discharge Placement                       Discharge Plan and Services Additional resources added to the After Visit Summary for     Discharge Planning Services: CM Consult                                 Social Drivers of Health (SDOH) Interventions SDOH Screenings   Food Insecurity: No Food Insecurity (09/29/2024)  Housing: Low Risk  (09/29/2024)  Transportation Needs: No Transportation Needs (09/29/2024)  Utilities: Not At Risk (09/29/2024)  Alcohol Screen: Low Risk  (06/28/2024)  Depression (PHQ2-9): Low Risk  (08/24/2024)  Financial Resource Strain: Low Risk  (06/28/2024)  Physical Activity: Insufficiently Active (06/28/2024)  Social Connections: Moderately Integrated (09/29/2024)  Stress: No Stress Concern Present (06/28/2024)  Tobacco Use: Medium Risk (09/27/2024)  Health Literacy: Adequate Health Literacy (06/29/2024)     Readmission Risk Interventions     No data to display

## 2024-10-12 ENCOUNTER — Inpatient Hospital Stay (HOSPITAL_COMMUNITY)

## 2024-10-12 ENCOUNTER — Inpatient Hospital Stay (HOSPITAL_COMMUNITY)
Admission: AD | Admit: 2024-10-12 | Discharge: 2024-10-18 | DRG: 092 | Disposition: A | Discharge status: Home / Self Care | Source: Intra-hospital | Attending: Physical Medicine and Rehabilitation | Admitting: Physical Medicine and Rehabilitation

## 2024-10-12 ENCOUNTER — Encounter (HOSPITAL_COMMUNITY): Payer: Self-pay | Admitting: Physical Medicine and Rehabilitation

## 2024-10-12 ENCOUNTER — Other Ambulatory Visit: Payer: Self-pay

## 2024-10-12 DIAGNOSIS — K5900 Constipation, unspecified: Secondary | ICD-10-CM | POA: Diagnosis present

## 2024-10-12 DIAGNOSIS — K567 Ileus, unspecified: Secondary | ICD-10-CM | POA: Diagnosis present

## 2024-10-12 DIAGNOSIS — J690 Pneumonitis due to inhalation of food and vomit: Secondary | ICD-10-CM | POA: Diagnosis present

## 2024-10-12 DIAGNOSIS — K7400 Hepatic fibrosis, unspecified: Secondary | ICD-10-CM | POA: Diagnosis present

## 2024-10-12 DIAGNOSIS — G7281 Critical illness myopathy: Secondary | ICD-10-CM | POA: Diagnosis present

## 2024-10-12 DIAGNOSIS — I5022 Chronic systolic (congestive) heart failure: Secondary | ICD-10-CM | POA: Diagnosis present

## 2024-10-12 DIAGNOSIS — F4323 Adjustment disorder with mixed anxiety and depressed mood: Secondary | ICD-10-CM | POA: Diagnosis not present

## 2024-10-12 DIAGNOSIS — N179 Acute kidney failure, unspecified: Secondary | ICD-10-CM | POA: Diagnosis present

## 2024-10-12 DIAGNOSIS — L0292 Furuncle, unspecified: Secondary | ICD-10-CM | POA: Diagnosis present

## 2024-10-12 DIAGNOSIS — L0232 Furuncle of buttock: Secondary | ICD-10-CM | POA: Diagnosis present

## 2024-10-12 DIAGNOSIS — E440 Moderate protein-calorie malnutrition: Secondary | ICD-10-CM | POA: Diagnosis present

## 2024-10-12 DIAGNOSIS — E785 Hyperlipidemia, unspecified: Secondary | ICD-10-CM | POA: Diagnosis present

## 2024-10-12 DIAGNOSIS — Z79899 Other long term (current) drug therapy: Secondary | ICD-10-CM | POA: Diagnosis not present

## 2024-10-12 DIAGNOSIS — I10 Essential (primary) hypertension: Secondary | ICD-10-CM | POA: Diagnosis not present

## 2024-10-12 DIAGNOSIS — Z9049 Acquired absence of other specified parts of digestive tract: Secondary | ICD-10-CM | POA: Diagnosis not present

## 2024-10-12 DIAGNOSIS — Z8616 Personal history of COVID-19: Secondary | ICD-10-CM | POA: Diagnosis not present

## 2024-10-12 DIAGNOSIS — Z8249 Family history of ischemic heart disease and other diseases of the circulatory system: Secondary | ICD-10-CM | POA: Diagnosis not present

## 2024-10-12 DIAGNOSIS — I110 Hypertensive heart disease with heart failure: Secondary | ICD-10-CM | POA: Diagnosis present

## 2024-10-12 DIAGNOSIS — E875 Hyperkalemia: Secondary | ICD-10-CM | POA: Diagnosis present

## 2024-10-12 DIAGNOSIS — Z87891 Personal history of nicotine dependence: Secondary | ICD-10-CM | POA: Diagnosis not present

## 2024-10-12 DIAGNOSIS — E872 Acidosis, unspecified: Secondary | ICD-10-CM | POA: Diagnosis present

## 2024-10-12 DIAGNOSIS — E871 Hypo-osmolality and hyponatremia: Secondary | ICD-10-CM | POA: Diagnosis present

## 2024-10-12 DIAGNOSIS — Z860100 Personal history of colon polyps, unspecified: Secondary | ICD-10-CM | POA: Diagnosis not present

## 2024-10-12 DIAGNOSIS — R1310 Dysphagia, unspecified: Secondary | ICD-10-CM | POA: Diagnosis present

## 2024-10-12 DIAGNOSIS — Z833 Family history of diabetes mellitus: Secondary | ICD-10-CM | POA: Diagnosis not present

## 2024-10-12 DIAGNOSIS — Z800 Family history of malignant neoplasm of digestive organs: Secondary | ICD-10-CM | POA: Diagnosis not present

## 2024-10-12 LAB — GLUCOSE, CAPILLARY
Glucose-Capillary: 118 mg/dL — ABNORMAL HIGH (ref 70–99)
Glucose-Capillary: 121 mg/dL — ABNORMAL HIGH (ref 70–99)
Glucose-Capillary: 85 mg/dL (ref 70–99)
Glucose-Capillary: 98 mg/dL (ref 70–99)

## 2024-10-12 MED ORDER — DIPHENHYDRAMINE HCL 25 MG PO CAPS: 25.0000 mg | ORAL_CAPSULE | Freq: Four times a day (QID) | ORAL | Status: DC | PRN

## 2024-10-12 MED ORDER — INSULIN ASPART 100 UNIT/ML IJ SOLN
0.0000 [IU] | Freq: Three times a day (TID) | INTRAMUSCULAR | Status: DC
  Administered 2024-10-14: 1 [IU] via SUBCUTANEOUS

## 2024-10-12 MED ORDER — VITAMIN C 500 MG PO TABS
1000.0000 mg | ORAL_TABLET | Freq: Every day | ORAL | Status: DC
  Administered 2024-10-13 – 2024-10-18 (×6): 1000 mg via ORAL
  Filled 2024-10-12 (×6): qty 2

## 2024-10-12 MED ORDER — CALCIUM CARBONATE ANTACID 500 MG PO CHEW
1.0000 | CHEWABLE_TABLET | Freq: Three times a day (TID) | ORAL | Status: DC | PRN
  Administered 2024-10-15: 200 mg via ORAL
  Filled 2024-10-12: qty 1

## 2024-10-12 MED ORDER — INSULIN ASPART 100 UNIT/ML IJ SOLN
0.0000 [IU] | Freq: Three times a day (TID) | INTRAMUSCULAR | Status: DC
  Administered 2024-10-12: 1 [IU] via SUBCUTANEOUS
  Filled 2024-10-12: qty 1

## 2024-10-12 MED ORDER — ZINC SULFATE 220 (50 ZN) MG PO CAPS
220.0000 mg | ORAL_CAPSULE | Freq: Every day | ORAL | Status: DC
  Administered 2024-10-12 – 2024-10-17 (×6): 220 mg via ORAL
  Filled 2024-10-12 (×6): qty 1

## 2024-10-12 MED ORDER — SENNOSIDES-DOCUSATE SODIUM 8.6-50 MG PO TABS
2.0000 | ORAL_TABLET | Freq: Two times a day (BID) | ORAL | Status: DC
  Administered 2024-10-12 – 2024-10-18 (×12): 2 via ORAL
  Filled 2024-10-12 (×12): qty 2

## 2024-10-12 MED ORDER — HYDROXYZINE HCL 10 MG PO TABS: 10.0000 mg | ORAL_TABLET | Freq: Three times a day (TID) | ORAL | Status: DC | PRN

## 2024-10-12 MED ORDER — MELATONIN 3 MG PO TABS: 3.0000 mg | ORAL_TABLET | Freq: Every evening | ORAL | Status: DC | PRN

## 2024-10-12 MED ORDER — FLEET ENEMA RE ENEM: 1.0000 | ENEMA | Freq: Once | RECTAL | Status: AC | PRN

## 2024-10-12 MED ORDER — ACETAMINOPHEN 325 MG PO TABS: 325.0000 mg | ORAL_TABLET | ORAL | Status: DC | PRN

## 2024-10-12 MED ORDER — PROCHLORPERAZINE 25 MG RE SUPP: 12.5000 mg | Freq: Four times a day (QID) | RECTAL | Status: DC | PRN

## 2024-10-12 MED ORDER — PROCHLORPERAZINE EDISYLATE 10 MG/2ML IJ SOLN: 5.0000 mg | Freq: Four times a day (QID) | INTRAMUSCULAR | Status: DC | PRN

## 2024-10-12 MED ORDER — LIDOCAINE 5 % EX PTCH
1.0000 | MEDICATED_PATCH | Freq: Every day | CUTANEOUS | Status: DC
  Administered 2024-10-13 – 2024-10-18 (×2): 1 via TRANSDERMAL
  Filled 2024-10-12 (×7): qty 1

## 2024-10-12 MED ORDER — ORAL CARE MOUTH RINSE: 15.0000 mL | OROMUCOSAL | Status: DC | PRN

## 2024-10-12 MED ORDER — HEPARIN SODIUM (PORCINE) 5000 UNIT/ML IJ SOLN
5000.0000 [IU] | Freq: Three times a day (TID) | INTRAMUSCULAR | Status: DC
  Administered 2024-10-12 – 2024-10-13 (×2): 5000 [IU] via SUBCUTANEOUS
  Filled 2024-10-12 (×2): qty 1

## 2024-10-12 MED ORDER — PROCHLORPERAZINE MALEATE 5 MG PO TABS: 5.0000 mg | ORAL_TABLET | Freq: Four times a day (QID) | ORAL | Status: DC | PRN

## 2024-10-12 MED ORDER — MENTHOL 3 MG MT LOZG
1.0000 | LOZENGE | OROMUCOSAL | Status: DC | PRN
  Administered 2024-10-16: 3 mg via ORAL
  Filled 2024-10-12: qty 9

## 2024-10-12 MED ORDER — BISACODYL 10 MG RE SUPP: 10.0000 mg | Freq: Every day | RECTAL | Status: DC | PRN

## 2024-10-12 MED ORDER — GUAIFENESIN 100 MG/5ML PO LIQD
10.0000 mL | Freq: Two times a day (BID) | ORAL | Status: DC
  Administered 2024-10-12 – 2024-10-18 (×12): 10 mL via ORAL
  Filled 2024-10-12 (×13): qty 10

## 2024-10-12 MED ORDER — LOSARTAN POTASSIUM 25 MG PO TABS
100.0000 mg | ORAL_TABLET | Freq: Every day | ORAL | Status: DC
  Administered 2024-10-13 – 2024-10-18 (×6): 100 mg via ORAL
  Filled 2024-10-12 (×6): qty 4

## 2024-10-12 MED ORDER — KATE FARMS STANDARD 1.4 EN LIQD
325.0000 mL | Freq: Two times a day (BID) | ENTERAL | Status: DC
  Filled 2024-10-12 (×2): qty 325

## 2024-10-12 NOTE — Progress Notes (Signed)
 Report given to RN getting [phone].

## 2024-10-12 NOTE — Discharge Summary (Signed)
 Physician Discharge Summary  Bryce Martinez FMW:[id number] DOB: 20-Oct-1951 DOA: 09/27/2024  PCP: Antonetta Rollene BRAVO, MD  Admit date: 09/27/2024 Discharge date: 10/12/2024  Admitted From: home Disposition:  CIR  Recommendations for Outpatient Follow-up:  Follow up with PCP in 1-2 weeks Repeat 2d echo as an outpatient in 3-4 weeks  Home Health: none Equipment/Devices: none  Discharge Condition: stable CODE STATUS: Full code Diet Orders (From admission, onward)     Start     Ordered   10/05/24 1256  DIET DYS 3 Room service appropriate? Yes with Assist; Fluid consistency: Thin  Diet effective now       Question Answer Comment  Room service appropriate? Yes with Assist   Fluid consistency: Thin      10/05/24 1255            Brief Narrative / Interim history: 73 year old male with history of HTN, HLD, hep C status posttreatment, advanced fibrosis was initially seen at [facility] on 11/24 for routine screening colonoscopy, he apparently aspirated during the procedure, monitored and discharged home on room air.  At home she has had worsening respiratory symptoms, was brought back requiring 15 L, respiratory status got worse and eventually ended up being intubated.  He developed presumed ARDS due to aspiration.  Incidentally he was found to be COVID-positive as well.  Eventually extubated, and transferred to the hospitalist service 12/3  Hospital Course / Discharge diagnoses: Principal problem Acute hypoxic respiratory failure due to ARDS from aspiration pneumonia -requiring intubation initially.  He is status post 5 days of antibiotics, now completed.  He was eventually extubated on 12/1, respiratory status much better, now he is on room air. PT recommends CIR, will be discharged in stable condition   Active problems Essential hypertension-continue losartan , blood pressure is stable Acute systolic CHF -thought to be due to septic cardiomyopathy, shock.  2D echocardiogram  showed LVEF 25 to 30%, global hypokinesis.  RV systolic function was normal.  He is on losartan , continue.  Anticipate recovery, will need repeat echo in 3 to 4 weeks as an outpatient.  Euvolemic COVID-positive-likely incidental, his ARDS was due to aspiration given timing postanesthesia, never had any fever, chills, flulike illness so I wonder whether this is an older infection and the PCR was positive still. No longer needs precautions  History of hep C, advanced fibrosis -outpatient follow-up with GI AKI-creatinine improved, creatinine most recently normalized Hyperkalemia -status post Lokelma . Hold spironolactone  for now DM2 -continue sliding scale Deconditioning - due to intubation, ARDS.    Sepsis ruled out   Discharge Instructions   Allergies as of 10/12/2024       Reactions   Amlodipine  Other (See Comments)   unknown        Medication List     STOP taking these medications    spironolactone  25 MG tablet Commonly known as: ALDACTONE        TAKE these medications    losartan  100 MG tablet Commonly known as: COZAAR  TAKE 1 TABLET BY MOUTH EVERY DAY   meclizine  25 MG tablet Commonly known as: ANTIVERT  Take 1 tablet (25 mg total) by mouth 2 (two) times daily as needed for dizziness.   Misc. Devices Misc Blood pressure cuff/device - 1. ICD10: I10       Consultations: PCCM  Procedures/Studies:  DG CHEST PORT 1 VIEW Result Date: 10/04/2024 CLINICAL DATA:  141880 SOB (shortness of breath) [id number] EXAM: PORTABLE CHEST - 1 VIEW COMPARISON:  October 02, 2024 FINDINGS: Low lung volumes.  Patchy opacities in both lung bases. No pleural effusion or pneumothorax. No cardiomegaly. Aortic atherosclerosis. No acute fracture or destructive lesions. Multilevel thoracic osteophytosis. Right IJ approach central venous catheter terminates at the cavoatrial junction. Endotracheal tube is well-positioned in the mid trachea. Weighted feeding tube courses below the diaphragm with the  distal tip not included in the field of view. IMPRESSION: 1. Similar patchy airspace opacities in both lung bases. No pleural effusion or pneumothorax. 2. Similar positioning of the support tubes and lines, as described above. Electronically Signed   By: Rogelia Myers M.D.   On: 10/04/2024 10:04   DG Chest 1 View Result Date: 10/02/2024 EXAM: 1 VIEW(S) XRAY OF THE CHEST 10/02/2024 08:03:00 AM COMPARISON: 09/27/2024 CLINICAL HISTORY: ARDS (adult respiratory distress syndrome) (HCC) FINDINGS: LINES, TUBES AND DEVICES: Stable endotracheal tube. Enteric feeding tube in place, courses to abdomen with tip not included. Stable right IJ central line. LUNGS AND PLEURA: Increased confluence of left lung base opacity with obscuration of left hemidiaphragm. Improved left lung aeration with decreased but persistent widespread left lung airspace opacity. Possible left pleural effusion. No pneumothorax. HEART AND MEDIASTINUM: No acute abnormality of the cardiac and mediastinal silhouettes. BONES AND SOFT TISSUES: Chronic right shoulder calcific tendinosis redemonstrated. No acute osseous abnormality. IMPRESSION: 1. Increased confluence of left lung base opacity with obscuration of left hemidiaphragm. 2. Improved left lung aeration with decreased but persistent widespread left lung airspace opacity. 3. Possible left pleural effusion. Electronically signed by: Evalene Coho MD 10/02/2024 08:21 AM EST RP Workstation: [id number]   DG Abd 1 View Result Date: 10/02/2024 EXAM: 1 VIEW XRAY OF THE ABDOMEN 10/02/2024 08:03:00 AM COMPARISON: None available. CLINICAL HISTORY: Abdominal distension. FINDINGS: LINES, TUBES AND DEVICES: There is a feeding tube present with its tip in the distal stomach. BOWEL: There is mild diffuse gaseous distention of the bowel. SOFT TISSUES: Surgical clips are present in the gallbladder fossa. No opaque urinary calculi. BONES: No acute osseous abnormality. LUNGS: There is coarsened streaky  opacification/consolidation of the left lung base. IMPRESSION: 1. Mild diffuse gaseous distention of the bowel. 2. Coarsened streaky opacification/consolidation of the left lung base. Electronically signed by: Evalene Coho MD 10/02/2024 08:21 AM EST RP Workstation: [id number]   ECHOCARDIOGRAM COMPLETE Result Date: 09/28/2024    ECHOCARDIOGRAM REPORT   Patient Name:   Bryce Martinez Date of Exam: 09/28/2024 Medical Rec #:  [id number]          Height:       73.0 in Accession #:    [id number]         Weight:       183.0 lb Date of Birth:  22-Apr-1951          BSA:          2.072 m Patient Age:    73 years           BP:           143/87 mmHg Patient Gender: M                  HR:           104 bpm. Exam Location:  Inpatient Procedure: 2D Echo, Cardiac Doppler, Color Doppler and Intracardiac            Opacification Agent (Both Spectral and Color Flow Doppler were            utilized during procedure). Indications:     Shock  History:  Patient has no prior history of Echocardiogram examinations.                  Signs/Symptoms:Shortness of Breath and Dyspnea; Risk                  Factors:Hypertension and Dyslipidemia. Covid positive.  Sonographer:     Ellouise Mose RDCS Referring Phys:  [id number] VERDON RAMAN DESAI Diagnosing Phys: Darryle Decent MD  Sonographer Comments: Echo performed with patient supine and on artificial respirator. IMPRESSIONS  1. Left ventricular ejection fraction, by estimation, is 25 to 30%. The left ventricle has severely decreased function. The left ventricle demonstrates global hypokinesis. There is mild concentric left ventricular hypertrophy. Left ventricular diastolic  parameters are indeterminate.  2. Right ventricular systolic function is normal. The right ventricular size is normal. Tricuspid regurgitation signal is inadequate for assessing PA pressure.  3. Moderate pleural effusion in the left lateral region.  4. The mitral valve is grossly normal. Trivial mitral valve  regurgitation. No evidence of mitral stenosis.  5. The aortic valve is tricuspid. Aortic valve regurgitation is not visualized. No aortic stenosis is present. Comparison(s): No prior Echocardiogram. FINDINGS  Left Ventricle: Left ventricular ejection fraction, by estimation, is 25 to 30%. The left ventricle has severely decreased function. The left ventricle demonstrates global hypokinesis. Definity  contrast agent was given IV to delineate the left ventricular endocardial borders. The left ventricular internal cavity size was normal in size. There is mild concentric left ventricular hypertrophy. Left ventricular diastolic parameters are indeterminate. Right Ventricle: The right ventricular size is normal. No increase in right ventricular wall thickness. Right ventricular systolic function is normal. Tricuspid regurgitation signal is inadequate for assessing PA pressure. Left Atrium: Left atrial size was normal in size. Right Atrium: Right atrial size was normal in size. Pericardium: There is no evidence of pericardial effusion. Mitral Valve: The mitral valve is grossly normal. Trivial mitral valve regurgitation. No evidence of mitral valve stenosis. Tricuspid Valve: The tricuspid valve is grossly normal. Tricuspid valve regurgitation is trivial. No evidence of tricuspid stenosis. Aortic Valve: The aortic valve is tricuspid. Aortic valve regurgitation is not visualized. No aortic stenosis is present. Pulmonic Valve: The pulmonic valve was grossly normal. Pulmonic valve regurgitation is trivial. No evidence of pulmonic stenosis. Aorta: The aortic root and ascending aorta are structurally normal, with no evidence of dilitation. Venous: IVC assessment for right atrial pressure unable to be performed due to mechanical ventilation. IAS/Shunts: No atrial level shunt detected by color flow Doppler. Additional Comments: There is a moderate pleural effusion in the left lateral region.  LEFT VENTRICLE PLAX 2D LVIDd:          4.20 cm     Diastology LVIDs:         3.10 cm     LV e' medial:    6.64 cm/s LV PW:         1.20 cm     LV E/e' medial:  6.7 LV IVS:        1.10 cm     LV e' lateral:   6.09 cm/s LVOT diam:     2.40 cm     LV E/e' lateral: 7.4 LV SV:         53 LV SV Index:   26 LVOT Area:     4.52 cm  LV Volumes (MOD) LV vol d, MOD A2C: 60.2 ml LV vol d, MOD A4C: 47.9 ml LV vol s, MOD A2C: 31.7 ml LV vol s,  MOD A4C: 28.4 ml LV SV MOD A2C:     28.5 ml LV SV MOD A4C:     47.9 ml LV SV MOD BP:      25.6 ml RIGHT VENTRICLE             IVC RV S prime:     13.30 cm/s  IVC diam: 2.00 cm TAPSE (M-mode): 1.8 cm LEFT ATRIUM           Index       RIGHT ATRIUM           Index LA diam:      2.30 cm 1.11 cm/m  RA Area:     10.60 cm LA Vol (A2C): 4.2 ml  2.04 ml/m  RA Volume:   19.40 ml  9.36 ml/m LA Vol (A4C): 11.4 ml 5.50 ml/m  AORTIC VALVE LVOT Vmax:   99.80 cm/s LVOT Vmean:  60.900 cm/s LVOT VTI:    0.117 m  AORTA Ao Root diam: 3.50 cm Ao Asc diam:  3.50 cm MITRAL VALVE MV Area (PHT): 3.22 cm    SHUNTS MV Decel Time: 236 msec    Systemic VTI:  0.12 m MV E velocity: 44.80 cm/s  Systemic Diam: 2.40 cm MV A velocity: 52.95 cm/s MV E/A ratio:  0.85 Darryle Decent MD Electronically signed by Darryle Decent MD Signature Date/Time: 09/28/2024/12:29:40 PM    Final (Updated)    DG CHEST PORT 1 VIEW Result Date: 09/27/2024 EXAM: 1 VIEW(S) XRAY OF THE CHEST 09/27/2024 08:43:00 PM COMPARISON: 09/27/2024. CLINICAL HISTORY: [id number] Encounter for central line placement [phone] Encounter for central line placement FINDINGS: LINES, TUBES AND DEVICES: The endotracheal tube is present with the tip measuring 3.6 cm above the carina. The enteric tube is present. The tip is off the field of view but below the left hemidiaphragm. A right central venous catheter has been placed with the tip over the lower SVC region. LUNGS AND PLEURA: Patchy airspace disease in both lungs, more prominent on the left. This is similar to the prior study and likely represents  edema, pneumonia or aspiration. No pleural effusion. No pneumothorax. HEART AND MEDIASTINUM: No acute abnormality of the cardiac and mediastinal silhouettes. Mediastinal contours appear intact. BONES AND SOFT TISSUES: Degenerative changes in the right shoulder. IMPRESSION: 1. Right internal jugular central venous catheter with tip projecting over the lower SVC, without pneumothorax. 2. Patchy bilateral airspace disease, left greater than right, similar to the prior exam, likely representing edema, pneumonia, or aspiration. 3. No pleural effusion. Electronically signed by: Elsie Gravely MD 09/27/2024 08:47 PM EST RP Workstation: [id number]   DG Chest Port 1 View Result Date: 09/27/2024 EXAM: 1 VIEW(S) XRAY OF THE CHEST 09/27/2024 01:12:18 PM COMPARISON: 09/27/2024 CLINICAL HISTORY: POST INTUBATION FINDINGS: LINES, TUBES AND DEVICES: Endotracheal tube in place with tip 2.5 cm above the carina. Enteric tube in place with tip and side port overlying expected gastric lumen. Right upper quadrant surgical clips noted. LUNGS AND PLEURA: Persistent diffuse left lung airspace opacity potentially from pneumonia with involvement of the lower lobe and potentially parts of the upper lobe. New right perihilar and infrahilar airspace opacity. Appearance suspicious for multilobar pneumonia. Cannot exclude layering left pleural effusion. No pneumothorax. HEART AND MEDIASTINUM: No acute abnormality of the cardiac and mediastinal silhouettes. BONES AND SOFT TISSUES: Irregularity and deformity of the right acromion and potentially of the adjacent right humeral head. IMPRESSION: 1. Worsening multilobar pneumonia with increased involvement in the right perihilar and infrahilar regions , and continued  substantial left lung airspace opacity. 2. Endotracheal and enteric tubes in expected positions. 3. Stable irregularity and deformity of the right acromion and adjacent right humeral head. Electronically signed by: Ryan Salvage MD  09/27/2024 01:50 PM EST RP Workstation: [id number]   DG Chest Port 1 View Result Date: 09/27/2024 CLINICAL DATA:  Shortness of breath. EXAM: PORTABLE CHEST 1 VIEW COMPARISON:  Chest radiograph dated 10/09/2022. FINDINGS: Diffuse airspace opacity in the left lung most consistent with pneumonia. Follow-up to resolution recommended. The right lung is clear. A small left pleural effusion may be present. No pneumothorax. Stable cardiac silhouette. No acute osseous pathology. IMPRESSION: Left lung pneumonia. Follow-up to resolution recommended. Electronically Signed   By: Vanetta Chou M.D.   On: 09/27/2024 10:59   US  ABDOMEN LIMITED RUQ (LIVER/GB) Result Date: 09/20/2024 CLINICAL DATA:  hep C follow up EXAM: ULTRASOUND ABDOMEN LIMITED RIGHT UPPER QUADRANT COMPARISON:  Mar 19, 2024 FINDINGS: Gallbladder: Surgically absent Common bile duct: Diameter: Visualized portion measures 2 mm, within normal limits. Majority is not visualized. Liver: No focal lesion identified. Previously described benign cyst is not discretely visualized on today's exam. Diffusely mildly increased in parenchymal echogenicity. Portal vein is patent on color Doppler imaging with normal direction of blood flow towards the liver. Other: None. IMPRESSION: No focal lesion identified. Electronically Signed   By: Corean Salter M.D.   On: 09/20/2024 10:45     Subjective: - no chest pain, shortness of breath, no abdominal pain, nausea or vomiting.   Discharge Exam: BP 96/66 (BP Location: Right Arm)   Pulse 75   Temp 98.1 F (36.7 C)   Resp 18   Ht 6' 1 (1.854 m)   Wt 72 kg   SpO2 100%   BMI 20.94 kg/m   General: Pt is alert, awake, not in acute distress Cardiovascular: RRR, S1/S2 +, no rubs, no gallops Respiratory: CTA bilaterally, no wheezing, no rhonchi Abdominal: Soft, NT, ND, bowel sounds + Extremities: no edema, no cyanosis    The results of significant diagnostics from this hospitalization (including imaging,  microbiology, ancillary and laboratory) are listed below for reference.     Microbiology: No results found for this or any previous visit (from the past 240 hours).   Labs: Basic Metabolic Panel: Recent Labs  Lab 10/07/24 0846 10/08/24 0539  NA 134* 135  K 5.2* 4.5  CL 100 101  CO2 24 27  GLUCOSE 104* 130*  BUN 37* 32*  CREATININE 0.86 0.90  CALCIUM  8.6* 9.1  MG 2.0 1.9   Liver Function Tests: Recent Labs  Lab 10/07/24 0846 10/08/24 0539  AST 55* 44*  ALT 72* 72*  ALKPHOS 68 65  BILITOT 1.0 0.4  PROT 5.5* 6.0*  ALBUMIN 2.0* 2.0*   CBC: Recent Labs  Lab 10/07/24 0846 10/08/24 0539  WBC 13.8* 15.2*  HGB 13.1 12.1*  HCT 39.8 36.6*  MCV 85.2 83.9  PLT 264 344   CBG: Recent Labs  Lab 10/11/24 0808 10/11/24 1247 10/11/24 1711 10/11/24 1947 10/12/24 0844  GLUCAP 106* 104* 98 114* 118*   Hgb A1c No results for input(s): HGBA1C in the last 72 hours. Lipid Profile No results for input(s): CHOL, HDL, LDLCALC, TRIG, CHOLHDL, LDLDIRECT in the last 72 hours. Thyroid  function studies No results for input(s): TSH, T4TOTAL, T3FREE, THYROIDAB in the last 72 hours.  Invalid input(s): FREET3 Urinalysis    Component Value Date/Time   COLORURINE YELLOW 09/28/2024 1152   APPEARANCEUR HAZY (A) 09/28/2024 1152   APPEARANCEUR Clear 05/21/2024 1631  LABSPEC 1.024 09/28/2024 1152   PHURINE 5.0 09/28/2024 1152   GLUCOSEU NEGATIVE 09/28/2024 1152   HGBUR NEGATIVE 09/28/2024 1152   BILIRUBINUR NEGATIVE 09/28/2024 1152   BILIRUBINUR Negative 05/21/2024 1631   KETONESUR NEGATIVE 09/28/2024 1152   PROTEINUR 30 (A) 09/28/2024 1152   UROBILINOGEN 1.0 09/22/2017 1639   NITRITE NEGATIVE 09/28/2024 1152   LEUKOCYTESUR NEGATIVE 09/28/2024 1152    FURTHER DISCHARGE INSTRUCTIONS:   Get Medicines reviewed and adjusted: Please take all your medications with you for your next visit with your Primary MD   Laboratory/radiological data: Please request  your Primary MD to go over all hospital tests and procedure/radiological results at the follow up, please ask your Primary MD to get all Hospital records sent to his/her office.   In some cases, they will be blood work, cultures and biopsy results pending at the time of your discharge. Please request that your primary care M.D. goes through all the records of your hospital data and follows up on these results.   Also Note the following: If you experience worsening of your admission symptoms, develop shortness of breath, life threatening emergency, suicidal or homicidal thoughts you must seek medical attention immediately by calling 911 or calling your MD immediately  if symptoms less severe.   You must read complete instructions/literature along with all the possible adverse reactions/side effects for all the Medicines you take and that have been prescribed to you. Take any new Medicines after you have completely understood and accpet all the possible adverse reactions/side effects.    Do not drive when taking Pain medications or sleeping medications (Benzodaizepines)   Do not take more than prescribed Pain, Sleep and Anxiety Medications. It is not advisable to combine anxiety,sleep and pain medications without talking with your primary care practitioner   Special Instructions: If you have smoked or chewed Tobacco  in the last 2 yrs please stop smoking, stop any regular Alcohol  and or any Recreational drug use.   Wear Seat belts while driving.   Please note: You were cared for by a hospitalist during your hospital stay. Once you are discharged, your primary care physician will handle any further medical issues. Please note that NO REFILLS for any discharge medications will be authorized once you are discharged, as it is imperative that you return to your primary care physician (or establish a relationship with a primary care physician if you do not have one) for your post hospital discharge needs  so that they can reassess your need for medications and monitor your lab values.  Time coordinating discharge: 35 minutes  SIGNED:  Nilda Fendt, MD, PhD 10/12/2024, 9:46 AM

## 2024-10-12 NOTE — Plan of Care (Signed)
  Problem: Education: Goal: Knowledge of General Education information will improve Description: Including pain rating scale, medication(s)/side effects and non-pharmacologic comfort measures Outcome: Progressing   Problem: Nutrition: Goal: Adequate nutrition will be maintained Outcome: Progressing   Problem: Health Behavior/Discharge Planning: Goal: Ability to manage health-related needs will improve Outcome: Not Progressing   Problem: Activity: Goal: Risk for activity intolerance will decrease Outcome: Not Progressing

## 2024-10-12 NOTE — Progress Notes (Signed)
 Heart Failure Navigator Progress Note  Assessed for Heart & Vascular TOC clinic readiness.  Patient does not meet criteria due to has a scheduled Primary Care appointment on 10/22/24. Plans at discharge are CIR. Currently Insurance has denied, but there is an appeal in process. No HF TOC. .   Navigator will sign off at this time.   Stephane Haddock, BSN, Scientist, Clinical (histocompatibility And Immunogenetics) Only

## 2024-10-12 NOTE — Progress Notes (Signed)
 Inpatient Rehab Admissions Coordinator:    I have insurance approval and a bed available for pt to admit to CIR today. Dr. Trixie in agreement and [facility] aware.  I will notify pt/family and make arrangements.    Reche Lowers, PT, DPT Admissions Coordinator [phone] 10/12/24 9:57 AM

## 2024-10-12 NOTE — H&P (Signed)
 Physical Medicine and Rehabilitation Admission H&P    Chief Complaint  Patient presents with   Critical illness myopathy with functional deficits    HPI: Bryce Martinez is a 73 year old male with PMHx HLD, HTN, Hep C s/p posttreatment,advanced fibrosis. The patient presented to [facility] on 11/24 for routine screening colonoscopy subsequently complicated by aspiration during the procedure. He was monitored on room air and discharged home. While at home his respiratory status worsened and he returned back to the hospital same day with complaints of SOB, abdominal pain and back pain. He initially required 15 L oxygen, ABG showed metabolic acidosis. He was initially placed on BIPAP and was treated with IV Unasyn , DuoNeb and steroids in the ER. He was intubated and admitted by CCM and transferred to [facility] course complicated by acute systolic CHF where an echo revealed an of EF 25 to 30%, this was felt to be due to septic cardiomyopathy and HF.  AKI and hyperkalemia which later resolved after diuresis and lokelma . He was incidentally found to be Covid positive by PCR.  Cortrak was placed on 11/28 prior to extubation. A swallow evaluation completed on 12/05 showed mild dysphagia, SLP reccommended mechanical soft diet and approved upgrade as patient desires. Cortak discontinued on 12/06.  Prior to arrival the patient was independent working and driving. He is retired and lives in a one level home with 3 steps to enter. He currently requires min assist for RW, CGA for bed mobility and transfers. Therapy evaluations completed due to patient decreased functional mobility was admitted for a comprehensive rehab program.  Pt reports LBM this AM, but abd has been distended- but better since had large BM this AM. Still very fatigued esp after working with therapy  Review of Systems  Constitutional:  Positive for malaise/fatigue and weight loss.  HENT: Negative.    Eyes:  Negative.   Respiratory:  Positive for cough and shortness of breath. Negative for sputum production and wheezing.   Cardiovascular: Negative.   Gastrointestinal:  Positive for constipation. Negative for diarrhea, nausea and vomiting.       Significant abd distension  Genitourinary: Negative.   Musculoskeletal:  Positive for myalgias.  Skin: Negative.   Neurological:  Positive for weakness.  Psychiatric/Behavioral: Negative.    All other systems reviewed and are negative.     Past Medical History:  Diagnosis Date   Allergy    Arthritis    ED (erectile dysfunction)    GERD (gastroesophageal reflux disease)    Occasionally   Hepatitis C    Hyperlipidemia    Hypertension    Past Surgical History:  Procedure Laterality Date   APPENDECTOMY     CHOLECYSTECTOMY N/A 04/25/2014   Procedure: LAPAROSCOPIC CHOLECYSTECTOMY;  Surgeon: Oneil DELENA Budge, MD;  Location: [facility];  Service: General;  Laterality: N/A;   COLONOSCOPY N/A 09/01/2019   Procedure: COLONOSCOPY;  Surgeon: Golda Claudis PENNER, MD;  Location: [facility];  Service: Endoscopy;  Laterality: N/A;  930   COLONOSCOPY N/A 09/27/2024   Procedure: COLONOSCOPY;  Surgeon: Eartha Angelia Sieving, MD;  Location: [facility];  Service: Gastroenterology;  Laterality: N/A;  7:30am, ASA 1-2   HEMICOLECTOMY Right    lap hand assisted   POLYPECTOMY  09/01/2019   Procedure: POLYPECTOMY;  Surgeon: Golda Claudis PENNER, MD;  Location: [facility];  Service: Endoscopy;;  colon   right shoulder surgery Right 03/2005   rotator cuff repair   Family History  Problem Relation Age of Onset   Hypertension Mother    Cancer Father        colon    Hypertension Sister    Hypertension Brother    Diabetes Brother    Drug abuse Brother    Hypertension Daughter    Hypertension Son    Social History:  reports that he quit smoking about 52 years ago. His smoking use included cigarettes. He started smoking about 67 years ago. He has a 4.4 pack-year  smoking history. He has been exposed to tobacco smoke. He has never used smokeless tobacco. He reports that he does not currently use alcohol. He reports that he does not use drugs. Allergies:  Allergies  Allergen Reactions   Amlodipine  Other (See Comments)    unknown   Medications Prior to Admission  Medication Sig Dispense Refill   spironolactone  (ALDACTONE ) 25 MG tablet TAKE 1 TABLET (25 MG TOTAL) BY MOUTH DAILY. 90 tablet 3   losartan  (COZAAR ) 100 MG tablet TAKE 1 TABLET BY MOUTH EVERY DAY 90 tablet 1   meclizine  (ANTIVERT ) 25 MG tablet Take 1 tablet (25 mg total) by mouth 2 (two) times daily as needed for dizziness. 30 tablet 0   Misc. Devices MISC Blood pressure cuff/device - 1. ICD10: I10 1 each 0      Home: Home Living Family/patient expects to be discharged to:: Private residence Living Arrangements: Spouse/significant other Available Help at Discharge: Family, Available 24 hours/day Type of Home: House Home Access: Stairs to enter [employer] of Steps: 3 Entrance Stairs-Rails: Right, Left Home Layout: Laundry or work area in basement, One level Bathroom Shower/Tub: Engineer, Manufacturing Systems: Standard Home Equipment: None   Functional History: Prior Function Prior Level of Function : Independent/Modified Independent, Working/employed, Driving Mobility Comments: Independent without an AD. ADLs Comments: Independent with ADLs and IADLs. Drives. 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  Functional Status:  Mobility: Bed Mobility Overal bed mobility: Needs Assistance Bed Mobility: Supine to Sit, Sit to Supine Supine to sit: Min assist Sit to supine: Contact guard assist General bed mobility comments: assist for trunk elevation Transfers Overall transfer level: Needs assistance Equipment used: Rolling walker (2 wheels) Transfers: Sit to/from Stand Sit to Stand: From elevated surface, Contact guard  assist Bed to/from chair/wheelchair/BSC transfer type:: Step pivot Squat pivot transfers: Max assist, +2 safety/equipment, +2 physical assistance Step pivot transfers: Min assist, +2 safety/equipment, +2 physical assistance Transfer via Lift Equipment: Stedy General transfer comment: CGA for safety, practiced multiple times from high to low surfaces. Cues for hand placement, transitions to RW effectively with close guard. Ambulation/Gait Ambulation/Gait assistance: Min assist Gait Distance (Feet): 60 Feet Assistive device: Rolling walker (2 wheels), 1 person hand held assist Gait Pattern/deviations: Trunk flexed, Narrow base of support, Step-through pattern, Decreased stride length, Drifts right/left General Gait Details: Up to min assist for RW control at times. Slow, rigid and requires cues to widen BOS. Further distance limited by complaints of fatigue and weakness. Final 12 feet completed without AD, requiring UE support from therapist for balance with notable lateral instability. Cues for safety and awareness. Gait velocity: slowed Gait velocity interpretation: <1.31 ft/sec, indicative of household ambulator Pre-gait activities: Engineer, Water march with RW, CGA    ADL: ADL Overall ADL's : Needs assistance/impaired Eating/Feeding: Set up, Minimal assistance, Sitting Grooming: Minimal assistance, Moderate assistance, Sitting Upper Body Bathing: Moderate assistance, Maximal assistance, Sitting Lower Body Bathing: Total assistance, +2 for physical assistance, +2 for safety/equipment,  Sit to/from stand Upper Body Dressing : Minimal assistance, Sitting Lower Body Dressing: Minimal assistance, Sitting/lateral leans Toilet Transfer: Contact guard assist, Ambulation, Rolling walker (2 wheels) Toilet Transfer Details (indicate cue type and reason): simulated bed to chair Toileting- Clothing Manipulation and Hygiene: Set up, Contact guard assist, Bed level, Total assistance, +2 for physical  assistance, +2 for safety/equipment, Sit to/from stand Toileting - Clothing Manipulation Details (indicate cue type and reason): Set up to CGA for use of handheld urinal; Total assist +2 for pericare in stit/stand Functional mobility during ADLs: Contact guard assist, Rolling walker (2 wheels) General ADL Comments: Pt with decreased activity tolerance, fatiguing quickly during tasks. Pt with noted improvements in motor planning during functional tasks this session, but continuing to require mildly increased time for tasks.  Cognition: Cognition Orientation Level: Oriented X4 Cognition Arousal: Alert Behavior During Therapy: Flat affect  Physical Exam: Blood pressure 96/66, pulse 75, temperature 98.1 F (36.7 C), resp. rate 18, height 6' 1 (1.854 m), weight 72 kg, SpO2 100%. Physical Exam Vitals and nursing note reviewed. Exam conducted with a chaperone present.  Constitutional:      Comments: Pt asleep- woke for exam, then back to sleep- wife at bedside; appropriate, but so tired, NAD; mildly underweight  HENT:     Head: Normocephalic and atraumatic.     Comments: CN's OK    Right Ear: External ear normal.     Left Ear: External ear normal.     Nose: Nose normal. No congestion.     Mouth/Throat:     Mouth: Mucous membranes are dry.     Pharynx: No oropharyngeal exudate.  Eyes:     General:        Right eye: No discharge.        Left eye: No discharge.     Extraocular Movements: Extraocular movements intact.  Cardiovascular:     Rate and Rhythm: Normal rate and regular rhythm.     Heart sounds: Normal heart sounds. No murmur heard.    No gallop.  Pulmonary:     Effort: Pulmonary effort is normal.     Breath sounds: Normal breath sounds.     Comments: Decreased at bases somewhat, but good air movement B/L Abdominal:     Comments: Somewhat distended- but better than last week- soft, hypoactive BS  Musculoskeletal:     Cervical back: Neck supple.     Comments: Deltoids 2/5;  Biceps/triceps 3+/5; WE/grip and FA 5-/5 B/L LE's- HF 3-/5; KE/KF 4/5 and DF/PF 5-/5 B/L  Skin:    General: Skin is warm and dry.     Comments: No skin breakdown on skin that's easily seen  Neurological:     Mental Status: He is oriented to person, place, and time.     Comments: Ox3 Intact to light touch in all 4 extremities Decreased DTR's throughout  Psychiatric:     Comments: Flat, tired     Results for orders placed or performed during the hospital encounter of 09/27/24 (from the past 48 hours)  Glucose, capillary     Status: Abnormal   Collection Time: 10/10/24  4:07 PM  Result Value Ref Range   Glucose-Capillary 100 (H) 70 - 99 mg/dL    Comment: Glucose reference range applies only to samples taken after fasting for at least 8 hours.   Comment 1 Document in Chart   Glucose, capillary     Status: Abnormal   Collection Time: 10/10/24  9:19 PM  Result Value Ref Range  Glucose-Capillary 130 (H) 70 - 99 mg/dL    Comment: Glucose reference range applies only to samples taken after fasting for at least 8 hours.  Glucose, capillary     Status: None   Collection Time: 10/11/24 12:22 AM  Result Value Ref Range   Glucose-Capillary 84 70 - 99 mg/dL    Comment: Glucose reference range applies only to samples taken after fasting for at least 8 hours.  Glucose, capillary     Status: None   Collection Time: 10/11/24  4:25 AM  Result Value Ref Range   Glucose-Capillary 87 70 - 99 mg/dL    Comment: Glucose reference range applies only to samples taken after fasting for at least 8 hours.  Glucose, capillary     Status: Abnormal   Collection Time: 10/11/24  8:08 AM  Result Value Ref Range   Glucose-Capillary 106 (H) 70 - 99 mg/dL    Comment: Glucose reference range applies only to samples taken after fasting for at least 8 hours.  Glucose, capillary     Status: Abnormal   Collection Time: 10/11/24 12:47 PM  Result Value Ref Range   Glucose-Capillary 104 (H) 70 - 99 mg/dL    Comment:  Glucose reference range applies only to samples taken after fasting for at least 8 hours.  Glucose, capillary     Status: None   Collection Time: 10/11/24  5:11 PM  Result Value Ref Range   Glucose-Capillary 98 70 - 99 mg/dL    Comment: Glucose reference range applies only to samples taken after fasting for at least 8 hours.  Glucose, capillary     Status: Abnormal   Collection Time: 10/11/24  7:47 PM  Result Value Ref Range   Glucose-Capillary 114 (H) 70 - 99 mg/dL    Comment: Glucose reference range applies only to samples taken after fasting for at least 8 hours.  Glucose, capillary     Status: Abnormal   Collection Time: 10/12/24  8:44 AM  Result Value Ref Range   Glucose-Capillary 118 (H) 70 - 99 mg/dL    Comment: Glucose reference range applies only to samples taken after fasting for at least 8 hours.  Glucose, capillary     Status: Abnormal   Collection Time: 10/12/24 11:35 AM  Result Value Ref Range   Glucose-Capillary 121 (H) 70 - 99 mg/dL    Comment: Glucose reference range applies only to samples taken after fasting for at least 8 hours.   No results found.    Blood pressure 96/66, pulse 75, temperature 98.1 F (36.7 C), resp. rate 18, height 6' 1 (1.854 m), weight 72 kg, SpO2 100%.  Medical Problem List and Plan: 1. Functional deficits secondary to ICU myopathy after prolonged ICU/intubation  -patient may  shower  -ELOS/Goals: 18-22 days- min A to supervision- SLP mod I  Admit to CIR_ appropriate  2.  Antithrombotics: -DVT/anticoagulation:  Mechanical: Sequential compression devices, below knee Bilateral lower extremities Pharmaceutical: Heparin   -antiplatelet therapy: N/A  3. Pain Management: Lidoderm  patch daily. Tylenol  prn   4. Mood/Behavior/Sleep: LCSW to follow for evaluation and support when available.   -antipsychotic agents: N/A  -anxiety: Atarax  10 mg prn   -sleep: Melatonin 3 mg   5. Neuropsych/cognition: This patient is capable of making  decisions on his own behalf.  6. Skin/Wound Care: Routine pressure relief measures.   7. Fluids/Electrolytes/Nutrition: Monitor strict I&O and daily weights.  Follow up labs CBC/CMP. RD consulted for ongoing care. Patient found to be highly intolerant of  lactose.   -Dys 3 with Mallie Pinion vs Ensure supplement   -Vitamin C  and Zinc    8. Acute Hypoxic resp failure due to ARDS: From aspiration PNA. Extubated on 12/1 and has been 98-100% on room air.  -Continue Ins spiro.   9. Acute Systolic CHF: LVEF 25 to 30%, global hypokinesis due to ARDS and cardiogenic shock. Repeat echo recommended in 3 to 4 weeks outpatient. Continue Losartan  100 mg daily.   10. HTN: Monitor BP per protocol. Continue Losartan .   11. Covid +: felt to be related to older infection, PCR still positive. Asymptomatic, does not require precautions.   -Robitussin 10 ml BID   12. AKI: resolved, Cr stable at 0.90.   13. Hyperkalemia: Required Lokelma  x1, resolved.  Monitor Labs.   14. Hx of Hep C s/p treatment, advanced fibrosis: 12/5  AST/ALT 44/72. Monitor with labs.  F/u outpatient with GI.   15. T2DM? Wife and pt say never been dx'd with DM: Hgb A1c 5.5 09/2017, glucose has been stable requiring minimal insulin .   -monitor BS ac/hs and use SSI, could d/c at some point.   16. Abdominal bloating: KUB ordered, continue Senna S. LBM character not documented.         Daphne LOISE Satterfield, NP 10/12/2024  I have personally performed a face to face diagnostic evaluation of this patient and formulated the key components of the plan.  Additionally, I have personally reviewed laboratory data, imaging studies, as well as relevant notes and concur with the physician assistant's documentation above.   The patient's status has not changed from the original H&P.  Any changes in documentation from the acute care chart have been noted above.

## 2024-10-12 NOTE — Evaluation (Signed)
 Occupational Therapy Assessment and Plan  Patient Details  Name: Bryce Martinez MRN: [id number] Date of Birth: [birth year]  OT Diagnosis: {diagnoses:3041644} Rehab Potential:   ELOS:     {CHL IP REHAB OT TIME CALCULATIONS:304400400}    Hospital Problem: Principal Problem:   Critical illness myopathy   Past Medical History:  Past Medical History:  Diagnosis Date   Allergy    Arthritis    ED (erectile dysfunction)    GERD (gastroesophageal reflux disease)    Occasionally   Hepatitis C    Hyperlipidemia    Hypertension    Past Surgical History:  Past Surgical History:  Procedure Laterality Date   APPENDECTOMY     CHOLECYSTECTOMY N/A 04/25/2014   Procedure: LAPAROSCOPIC CHOLECYSTECTOMY;  Surgeon: Oneil DELENA Budge, MD;  Location: [facility];  Service: General;  Laterality: N/A;   COLONOSCOPY N/A 09/01/2019   Procedure: COLONOSCOPY;  Surgeon: Golda Claudis PENNER, MD;  Location[facility];  Service: Endoscopy;  Laterality: N/A;  930   COLONOSCOPY N/A 09/27/2024   Procedure: COLONOSCOPY;  Surgeon: Eartha Angelia Sieving, MD;  Location: [facility];  Service: Gastroenterology;  Laterality: N/A;  7:30am, ASA 1-2   HEMICOLECTOMY Right    lap hand assisted   POLYPECTOMY  09/01/2019   Procedure: POLYPECTOMY;  Surgeon: Golda Claudis PENNER, MD;  Location: [facility];  Service: Endoscopy;;  colon   right shoulder surgery Right 03/2005   rotator cuff repair    Assessment & Plan Clinical Impression: Patient is a 73 y.o. year old male with PMH of hep C with advanced fibrosis, HTN, DM, remote tobacco use, who was admitted to [facility] on 11/24 with ARDS. Pt had a routine colonoscopy on 11/24 and post-procedure O2 saturation 93% on RA but he was discharged home. Procedure note indicate routine uncomplicated procedure. He presented to the ED at [facility] with ongoing shortness of breath, abdominal pain and back pain. Pt endorses vomiting during the colonoscopy. His O2 saturation on RA at  [facility] was 88%, ABG showed acidoses and pO2 of 59. Covid positive. He initially was on NRB, then bipap with no improvement and ultimately required intubation before he was transferred to [facility] on 09/27/24. He did require levophed  and precedex . On 12/1 he was successfully extubated to 3L [place] and is currently on roomair. He has completed 5 day course of antibiotics. Cardiac workup due to possible septic cardiomyopathy showed LVEF 25-30% with global hypokinesis. Cardiology anticipated recovery and f/u outpatient repeat echo in 3-4 weeks. Therapy ongoing and pt demonstrates profound proximal>distal weakness, consistent with critical illness myopathy. Recommendations for CIR.  Patient transferred to CIR on 10/12/2024 .    Patient currently requires {ONJ:6958369} with {JIO:6958368} secondary to {pfejpmfzwud:6958367}.  Prior to hospitalization, patient could complete *** with {ONJ:6958369}.  Patient will benefit from skilled intervention to {benefit of skilled intervention:3041641} prior to discharge {discharge:3041642}.  Anticipate patient will require {supervision/assistance:22779} and {follow le:6958356}.      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  Trunk/Postural Assessment     Balance   Extremity/Trunk Assessment      Care Tool Care Tool Self Care Eating        Oral Care         Bathing              Upper Body  Dressing(including orthotics)            Lower Body Dressing (excluding footwear)          Putting on/Taking off footwear             Care Tool Toileting Toileting activity         Care Tool Bed Mobility Roll left and right activity        Sit to lying activity        Lying to sitting on side of bed activity         Care Tool Transfers Sit to stand transfer        Chair/bed transfer         Toilet transfer         Care Tool Cognition  Expression of Ideas and Wants    Understanding Verbal and Non-Verbal Content     Memory/Recall Ability     Refer to Care Plan for Long Term Goals  SHORT TERM GOAL WEEK 1    Recommendations for other services: {RECOMMENDATIONS FOR OTHER SERVICES:3049016}   Skilled Therapeutic Intervention ADL   Mobility      Discharge Criteria: Patient will be discharged from OT if patient refuses treatment 3 consecutive times without medical reason, if treatment goals not met, if there is a change in medical status, if patient makes no progress towards goals or if patient is discharged from hospital.  The above assessment, treatment plan, treatment alternatives and goals were discussed and mutually agreed upon: {Assessment/Treatment Plan Discussed/Agreed:3049017}  Claudene [facility] 10/12/2024, 6:44 PM

## 2024-10-12 NOTE — Plan of Care (Signed)
  Problem: Education: Goal: Knowledge of General Education information will improve Description: Including pain rating scale, medication(s)/side effects and non-pharmacologic comfort measures Outcome: Progressing   Problem: Health Behavior/Discharge Planning: Goal: Ability to manage health-related needs will improve Outcome: Progressing   Problem: Clinical Measurements: Goal: Ability to maintain clinical measurements within normal limits will improve Outcome: Progressing Goal: Will remain free from infection Outcome: Progressing Goal: Diagnostic test results will improve Outcome: Progressing Goal: Respiratory complications will improve Outcome: Progressing Goal: Cardiovascular complication will be avoided Outcome: Progressing   Problem: Nutrition: Goal: Adequate nutrition will be maintained Outcome: Progressing   Problem: Activity: Goal: Risk for activity intolerance will decrease Outcome: Progressing   Problem: Coping: Goal: Level of anxiety will decrease Outcome: Progressing   Problem: Skin Integrity: Goal: Risk for impaired skin integrity will decrease Outcome: Progressing   Problem: Education: Goal: Knowledge of risk factors and measures for prevention of condition will improve Outcome: Progressing   Problem: Respiratory: Goal: Ability to maintain a clear airway and adequate ventilation will improve Outcome: Progressing

## 2024-10-12 NOTE — H&P (Signed)
 Physical Medicine and Rehabilitation Admission H&P        Chief Complaint  Patient presents with   Critical illness myopathy with functional deficits      HPI: Bryce Martinez is a 73 year old male with PMHx HLD, HTN, Hep C s/p posttreatment,advanced fibrosis. The patient presented to [facility] on 11/24 for routine screening colonoscopy subsequently complicated by aspiration during the procedure. He was monitored on room air and discharged home. While at home his respiratory status worsened and he returned back to the hospital same day with complaints of SOB, abdominal pain and back pain. He initially required 15 L oxygen, ABG showed metabolic acidosis. He was initially placed on BIPAP and was treated with IV Unasyn , DuoNeb and steroids in the ER. He was intubated and admitted by CCM and transferred to [facility] course complicated by acute systolic CHF where an echo revealed an of EF 25 to 30%, this was felt to be due to septic cardiomyopathy and HF.  AKI and hyperkalemia which later resolved after diuresis and lokelma . He was incidentally found to be Covid positive by PCR.  Cortrak was placed on 11/28 prior to extubation. A swallow evaluation completed on 12/05 showed mild dysphagia, SLP reccommended mechanical soft diet and approved upgrade as patient desires. Cortak discontinued on 12/06.  Prior to arrival the patient was independent working and driving. He is retired and lives in a one level home with 3 steps to enter. He currently requires min assist for RW, CGA for bed mobility and transfers. Therapy evaluations completed due to patient decreased functional mobility was admitted for a comprehensive rehab program.   Pt reports LBM this AM, but abd has been distended- but better since had large BM this AM. Still very fatigued esp after working with therapy   Review of Systems  Constitutional:  Positive for malaise/fatigue and weight loss.  HENT: Negative.     Eyes: Negative.   Respiratory:  Positive for cough and shortness of breath. Negative for sputum production and wheezing.   Cardiovascular: Negative.   Gastrointestinal:  Positive for constipation. Negative for diarrhea, nausea and vomiting.       Significant abd distension  Genitourinary: Negative.   Musculoskeletal:  Positive for myalgias.  Skin: Negative.   Neurological:  Positive for weakness.  Psychiatric/Behavioral: Negative.    All other systems reviewed and are negative.            Past Medical History:  Diagnosis Date   Allergy     Arthritis     ED (erectile dysfunction)     GERD (gastroesophageal reflux disease)      Occasionally   Hepatitis C     Hyperlipidemia     Hypertension               Past Surgical History:  Procedure Laterality Date   APPENDECTOMY       CHOLECYSTECTOMY N/A 04/25/2014    Procedure: LAPAROSCOPIC CHOLECYSTECTOMY;  Surgeon: Oneil DELENA Budge, MD;  Location: [facility];  Service: General;  Laterality: N/A;   COLONOSCOPY N/A 09/01/2019    Procedure: COLONOSCOPY;  Surgeon: Golda Claudis PENNER, MD;  Location: [facility];  Service: Endoscopy;  Laterality: N/A;  930   COLONOSCOPY N/A 09/27/2024    Procedure: COLONOSCOPY;  Surgeon: Eartha Angelia Sieving, MD;  Location: [facility];  Service: Gastroenterology;  Laterality: N/A;  7:30am, ASA 1-2   HEMICOLECTOMY Right      lap hand  assisted   POLYPECTOMY   09/01/2019    Procedure: POLYPECTOMY;  Surgeon: Golda Claudis PENNER, MD;  Location: [facility];  Service: Endoscopy;;  colon   right shoulder surgery Right 03/2005    rotator cuff repair             Family History  Problem Relation Age of Onset   Hypertension Mother     Cancer Father          colon    Hypertension Sister     Hypertension Brother     Diabetes Brother     Drug abuse Brother     Hypertension Daughter     Hypertension Son          Social History:  reports that he quit smoking about 52 years ago. His smoking use included  cigarettes. He started smoking about 67 years ago. He has a 4.4 pack-year smoking history. He has been exposed to tobacco smoke. He has never used smokeless tobacco. He reports that he does not currently use alcohol. He reports that he does not use drugs. Allergies:  Allergies       Allergies  Allergen Reactions   Amlodipine  Other (See Comments)      unknown            Medications Prior to Admission  Medication Sig Dispense Refill   spironolactone  (ALDACTONE ) 25 MG tablet TAKE 1 TABLET (25 MG TOTAL) BY MOUTH DAILY. 90 tablet 3   losartan  (COZAAR ) 100 MG tablet TAKE 1 TABLET BY MOUTH EVERY DAY 90 tablet 1   meclizine  (ANTIVERT ) 25 MG tablet Take 1 tablet (25 mg total) by mouth 2 (two) times daily as needed for dizziness. 30 tablet 0   Misc. Devices MISC Blood pressure cuff/device - 1. ICD10: I10 1 each 0              Home: Home Living Family/patient expects to be discharged to:: Private residence Living Arrangements: Spouse/significant other Available Help at Discharge: Family, Available 24 hours/day Type of Home: House Home Access: Stairs to enter [employer] of Steps: 3 Entrance Stairs-Rails: Right, Left Home Layout: Laundry or work area in basement, One level Bathroom Shower/Tub: Engineer, Manufacturing Systems: Standard Home Equipment: None   Functional History: Prior Function Prior Level of Function : Independent/Modified Independent, Working/employed, Driving Mobility Comments: Independent without an AD. ADLs Comments: Independent with ADLs and IADLs. Drives. 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   Functional Status:  Mobility: Bed Mobility Overal bed mobility: Needs Assistance Bed Mobility: Supine to Sit, Sit to Supine Supine to sit: Min assist Sit to supine: Contact guard assist General bed mobility comments: assist for trunk elevation Transfers Overall transfer level: Needs  assistance Equipment used: Rolling walker (2 wheels) Transfers: Sit to/from Stand Sit to Stand: From elevated surface, Contact guard assist Bed to/from chair/wheelchair/BSC transfer type:: Step pivot Squat pivot transfers: Max assist, +2 safety/equipment, +2 physical assistance Step pivot transfers: Min assist, +2 safety/equipment, +2 physical assistance Transfer via Lift Equipment: Stedy General transfer comment: CGA for safety, practiced multiple times from high to low surfaces. Cues for hand placement, transitions to RW effectively with close guard. Ambulation/Gait Ambulation/Gait assistance: Min assist Gait Distance (Feet): 60 Feet Assistive device: Rolling walker (2 wheels), 1 person hand held assist Gait Pattern/deviations: Trunk flexed, Narrow base of support, Step-through pattern, Decreased stride length, Drifts right/left General Gait Details: Up to min assist for RW control at times. Slow, rigid and  requires cues to widen BOS. Further distance limited by complaints of fatigue and weakness. Final 12 feet completed without AD, requiring UE support from therapist for balance with notable lateral instability. Cues for safety and awareness. Gait velocity: slowed Gait velocity interpretation: <1.31 ft/sec, indicative of household ambulator Pre-gait activities: Engineer, Water march with RW, CGA   ADL: ADL Overall ADL's : Needs assistance/impaired Eating/Feeding: Set up, Minimal assistance, Sitting Grooming: Minimal assistance, Moderate assistance, Sitting Upper Body Bathing: Moderate assistance, Maximal assistance, Sitting Lower Body Bathing: Total assistance, +2 for physical assistance, +2 for safety/equipment, Sit to/from stand Upper Body Dressing : Minimal assistance, Sitting Lower Body Dressing: Minimal assistance, Sitting/lateral leans Toilet Transfer: Contact guard assist, Ambulation, Rolling walker (2 wheels) Toilet Transfer Details (indicate cue type and reason): simulated bed to  chair Toileting- Clothing Manipulation and Hygiene: Set up, Contact guard assist, Bed level, Total assistance, +2 for physical assistance, +2 for safety/equipment, Sit to/from stand Toileting - Clothing Manipulation Details (indicate cue type and reason): Set up to CGA for use of handheld urinal; Total assist +2 for pericare in stit/stand Functional mobility during ADLs: Contact guard assist, Rolling walker (2 wheels) General ADL Comments: Pt with decreased activity tolerance, fatiguing quickly during tasks. Pt with noted improvements in motor planning during functional tasks this session, but continuing to require mildly increased time for tasks.   Cognition: Cognition Orientation Level: Oriented X4 Cognition Arousal: Alert Behavior During Therapy: Flat affect   Physical Exam: Blood pressure 96/66, pulse 75, temperature 98.1 F (36.7 C), resp. rate 18, height 6' 1 (1.854 m), weight 72 kg, SpO2 100%. Physical Exam Vitals and nursing note reviewed. Exam conducted with a chaperone present.  Constitutional:      Comments: Pt asleep- woke for exam, then back to sleep- wife at bedside; appropriate, but so tired, NAD; mildly underweight  HENT:     Head: Normocephalic and atraumatic.     Comments: CN's OK    Right Ear: External ear normal.     Left Ear: External ear normal.     Nose: Nose normal. No congestion.     Mouth/Throat:     Mouth: Mucous membranes are dry.     Pharynx: No oropharyngeal exudate.  Eyes:     General:        Right eye: No discharge.        Left eye: No discharge.     Extraocular Movements: Extraocular movements intact.  Cardiovascular:     Rate and Rhythm: Normal rate and regular rhythm.     Heart sounds: Normal heart sounds. No murmur heard.    No gallop.  Pulmonary:     Effort: Pulmonary effort is normal.     Breath sounds: Normal breath sounds.     Comments: Decreased at bases somewhat, but good air movement B/L Abdominal:     Comments: Somewhat  distended- but better than last week- soft, hypoactive BS  Musculoskeletal:     Cervical back: Neck supple.     Comments: Deltoids 2/5; Biceps/triceps 3+/5; WE/grip and FA 5-/5 B/L LE's- HF 3-/5; KE/KF 4/5 and DF/PF 5-/5 B/L  Skin:    General: Skin is warm and dry.     Comments: No skin breakdown on skin that's easily seen  Neurological:     Mental Status: He is oriented to person, place, and time.     Comments: Ox3 Intact to light touch in all 4 extremities Decreased DTR's throughout  Psychiatric:     Comments: Flat, tired  Lab Results Last 48 Hours        Results for orders placed or performed during the hospital encounter of 09/27/24 (from the past 48 hours)  Glucose, capillary     Status: Abnormal    Collection Time: 10/10/24  4:07 PM  Result Value Ref Range    Glucose-Capillary 100 (H) 70 - 99 mg/dL      Comment: Glucose reference range applies only to samples taken after fasting for at least 8 hours.    Comment 1 Document in Chart    Glucose, capillary     Status: Abnormal    Collection Time: 10/10/24  9:19 PM  Result Value Ref Range    Glucose-Capillary 130 (H) 70 - 99 mg/dL      Comment: Glucose reference range applies only to samples taken after fasting for at least 8 hours.  Glucose, capillary     Status: None    Collection Time: 10/11/24 12:22 AM  Result Value Ref Range    Glucose-Capillary 84 70 - 99 mg/dL      Comment: Glucose reference range applies only to samples taken after fasting for at least 8 hours.  Glucose, capillary     Status: None    Collection Time: 10/11/24  4:25 AM  Result Value Ref Range    Glucose-Capillary 87 70 - 99 mg/dL      Comment: Glucose reference range applies only to samples taken after fasting for at least 8 hours.  Glucose, capillary     Status: Abnormal    Collection Time: 10/11/24  8:08 AM  Result Value Ref Range    Glucose-Capillary 106 (H) 70 - 99 mg/dL      Comment: Glucose reference range applies only to samples taken  after fasting for at least 8 hours.  Glucose, capillary     Status: Abnormal    Collection Time: 10/11/24 12:47 PM  Result Value Ref Range    Glucose-Capillary 104 (H) 70 - 99 mg/dL      Comment: Glucose reference range applies only to samples taken after fasting for at least 8 hours.  Glucose, capillary     Status: None    Collection Time: 10/11/24  5:11 PM  Result Value Ref Range    Glucose-Capillary 98 70 - 99 mg/dL      Comment: Glucose reference range applies only to samples taken after fasting for at least 8 hours.  Glucose, capillary     Status: Abnormal    Collection Time: 10/11/24  7:47 PM  Result Value Ref Range    Glucose-Capillary 114 (H) 70 - 99 mg/dL      Comment: Glucose reference range applies only to samples taken after fasting for at least 8 hours.  Glucose, capillary     Status: Abnormal    Collection Time: 10/12/24  8:44 AM  Result Value Ref Range    Glucose-Capillary 118 (H) 70 - 99 mg/dL      Comment: Glucose reference range applies only to samples taken after fasting for at least 8 hours.  Glucose, capillary     Status: Abnormal    Collection Time: 10/12/24 11:35 AM  Result Value Ref Range    Glucose-Capillary 121 (H) 70 - 99 mg/dL      Comment: Glucose reference range applies only to samples taken after fasting for at least 8 hours.      Imaging Results (Last 48 hours)  No results found.         Blood pressure 96/66, pulse  75, temperature 98.1 F (36.7 C), resp. rate 18, height 6' 1 (1.854 m), weight 72 kg, SpO2 100%.   Medical Problem List and Plan: 1. Functional deficits secondary to ICU myopathy after prolonged ICU/intubation             -patient may  shower             -ELOS/Goals: 18-22 days- min A to supervision- SLP mod I             Admit to CIR_ appropriate   2.  Antithrombotics: -DVT/anticoagulation:  Mechanical: Sequential compression devices, below knee Bilateral lower extremities Pharmaceutical: Heparin              -antiplatelet  therapy: N/A   3. Pain Management: Lidoderm  patch daily. Tylenol  prn    4. Mood/Behavior/Sleep: LCSW to follow for evaluation and support when available.              -antipsychotic agents: N/A             -anxiety: Atarax  10 mg prn              -sleep: Melatonin 3 mg    5. Neuropsych/cognition: This patient is capable of making decisions on his own behalf.   6. Skin/Wound Care: Routine pressure relief measures.    7. Fluids/Electrolytes/Nutrition: Monitor strict I&O and daily weights.  Follow up labs CBC/CMP. RD consulted for ongoing care. Patient found to be highly intolerant of lactose.              -Dys 3 with Mallie Pinion vs Ensure supplement              -Vitamin C  and Zinc     8. Acute Hypoxic resp failure due to ARDS: From aspiration PNA. Extubated on 12/1 and has been 98-100% on room air.  -Continue Ins spiro.    9. Acute Systolic CHF: LVEF 25 to 30%, global hypokinesis due to ARDS and cardiogenic shock. Repeat echo recommended in 3 to 4 weeks outpatient. Continue Losartan  100 mg daily.    10. HTN: Monitor BP per protocol. Continue Losartan .    11. Covid +: felt to be related to older infection, PCR still positive. Asymptomatic, does not require precautions.              -Robitussin 10 ml BID    12. AKI: resolved, Cr stable at 0.90.    13. Hyperkalemia: Required Lokelma  x1, resolved.  Monitor Labs.    14. Hx of Hep C s/p treatment, advanced fibrosis: 12/5  AST/ALT 44/72. Monitor with labs.  F/u outpatient with GI.    15. T2DM? Wife and pt say never been dx'd with DM: Hgb A1c 5.5 09/2017, glucose has been stable requiring minimal insulin .              -monitor BS ac/hs and use SSI, could d/c at some point.    16. Abdominal bloating: KUB ordered, continue Senna S. LBM character not documented.              Daphne LOISE Satterfield, NP 10/12/2024   I have personally performed a face to face diagnostic evaluation of this patient and formulated the key components of the plan.   Additionally, I have personally reviewed laboratory data, imaging studies, as well as relevant notes and concur with the physician assistant's documentation above.   The patient's status has not changed from the original H&P.  Any changes in documentation from the acute care chart have been noted  above.

## 2024-10-12 NOTE — Progress Notes (Signed)
 Physical Therapy Treatment Patient Details Name: Bryce Martinez MRN: [id number] DOB: 26-Nov-1950 Today's Date: 10/12/2024   History of Present Illness 72 yo male admitted 11/24 with ARDS due to aspiration PNA after witnessed emesis during routine colonscopy at [facility]. intubated 11/24-12/1  COVID (+) PMH: OA, GERD, Hep C with advanced fibrosis, HLD, HTN, R TSR DM2, smoker    PT Comments  Pt received in bed, concerned about needing to have a BM due to meds received earlier so focused session on balance and strengthening activities in room instead of gait progression. Pt worked on sitting and standing activities with and without RW in standing. Tolerated static and dynamic standing balance exercises. Min A needed without UE support.Pt worked on reaching in all planes with R and L UE's.  Wife involved in exercises with pt as well.  Patient will benefit from intensive inpatient follow-up therapy, >3 hours/day. PT will continue to follow.      If plan is discharge home, recommend the following: A little help with walking and/or transfers;A little help with bathing/dressing/bathroom;Assistance with cooking/housework;Assist for transportation   Can travel by private vehicle        Equipment Recommendations  Rolling walker (2 wheels)    Recommendations for Other Services Rehab consult     Precautions / Restrictions Precautions Precautions: Fall Recall of Precautions/Restrictions: Intact Restrictions Weight Bearing Restrictions Per Provider Order: No     Mobility  Bed Mobility Overal bed mobility: Needs Assistance Bed Mobility: Supine to Sit, Sit to Supine     Supine to sit: Supervision Sit to supine: Supervision   General bed mobility comments: practiced 2x from flat bed for strengthening. Also took some time in flat bed supine for increased postural awareness    Transfers Overall transfer level: Needs assistance Equipment used: Rolling walker (2 wheels) Transfers: Sit to/from  Stand Sit to Stand: From elevated surface, Contact guard assist           General transfer comment: practiced with and without use of RW multiple times.    Ambulation/Gait Ambulation/Gait assistance: Min assist Gait Distance (Feet): 10 Feet Assistive device: 1 person hand held assist Gait Pattern/deviations: Trunk flexed, Narrow base of support, Step-through pattern, Decreased stride length, Drifts right/left Gait velocity: slowed Gait velocity interpretation: <1.8 ft/sec, indicate of risk for recurrent falls   General Gait Details: focused on balance activities today instead of progressing gait distance due to pt's concerns about needing to have a BM. Ambulated to bathroom end of session and left in there with wife present and RN notified   Stairs             Wheelchair Mobility     Tilt Bed    Modified Rankin (Stroke Patients Only)       Balance Overall balance assessment: Needs assistance Sitting-balance support: Single extremity supported, Feet supported, No upper extremity supported Sitting balance-Leahy Scale: Good   Postural control: Posterior lean (mild, in standing) Standing balance support: During functional activity, Single extremity supported, No upper extremity supported Standing balance-Leahy Scale: Poor Standing balance comment: worked on static activities without UE support and dynamic activities with unilateral support and no support with therapist guarding pt from the side and allowance of back of legs against bed.               High Level Balance Comments: worked on throwing and catching balloon in sitting and then standing, then batting balloon back and forth in sitting then standing. Worked on reaching in all planes.  Communication Communication Communication: Impaired Factors Affecting Communication: Reduced clarity of speech (soft voice)  Cognition Arousal: Alert Behavior During Therapy: Flat affect   PT - Cognitive  impairments: No apparent impairments                       PT - Cognition Comments: slow processing Following commands: Intact      Cueing Cueing Techniques: Verbal cues  Exercises Other Exercises Other Exercises: standing postural exercises x10 Other Exercises: heel raises with bilateral UE support x20 with pause at top    General Comments General comments (skin integrity, edema, etc.): VSS on RA. Wife participating in balance activities with pt as well      Pertinent Vitals/Pain Pain Assessment Pain Assessment: No/denies pain    Home Living                          Prior Function            PT Goals (current goals can now be found in the care plan section) Acute Rehab PT Goals Patient Stated Goal: home PT Goal Formulation: With patient/family Time For Goal Achievement: 10/20/24 Potential to Achieve Goals: Good Progress towards PT goals: Progressing toward goals    Frequency    Min 3X/week      PT Plan      Co-evaluation              AM-PAC PT 6 Clicks Mobility   Outcome Measure  Help needed turning from your back to your side while in a flat bed without using bedrails?: A Little Help needed moving from lying on your back to sitting on the side of a flat bed without using bedrails?: A Little Help needed moving to and from a bed to a chair (including a wheelchair)?: A Little Help needed standing up from a chair using your arms (e.g., wheelchair or bedside chair)?: A Little Help needed to walk in hospital room?: A Little Help needed climbing 3-5 steps with a railing? : A Lot 6 Click Score: 17    End of Session Equipment Utilized During Treatment: Gait belt Activity Tolerance: Patient tolerated treatment well Patient left: with call bell/phone within reach;with family/visitor present;in bed;with bed alarm set Nurse Communication: Mobility status PT Visit Diagnosis: Other abnormalities of gait and mobility (R26.89);Muscle  weakness (generalized) (M62.81);Difficulty in walking, not elsewhere classified (R26.2);Unsteadiness on feet (R26.81)     Time: 8987-8961 PT Time Calculation (min) (ACUTE ONLY): 26 min  Charges:    $Therapeutic Exercise: 8-22 mins $Neuromuscular Re-education: 8-22 mins PT General Charges $$ ACUTE PT VISIT: 1 Visit                     Richerd Lipoma, PT  Acute Rehab Services Secure chat preferred Office [phone]    Richerd CROME Flossie Wexler 10/12/2024, 1:23 PM

## 2024-10-12 NOTE — Progress Notes (Signed)
 Cornelio Bouchard, MD  Physician Physical Medicine and Rehabilitation   PMR Pre-admission    Signed   Date of Service: 10/12/2024 10:04 AM  Related encounter: ED to Hosp-Admission (Current) from 09/27/2024 in [facility]   Signed     Expand All Collapse All  PMR Admission Coordinator Pre-Admission Assessment   Patient: Bryce Martinez is an 73 y.o., male MRN: [id number] DOB: 1951-05-04 Height: 6' 1 (185.4 cm) Weight: 72 kg   Insurance Information HMO:     PPO: yes     PCP:      IPA:      80/20:      OTHER:  PRIMARY: [facility]      Policy#: [id number]     Subscriber:  CM Name:  email approval Phone#:      Fax#: [email or web address] Pre-Cert#: [id number] auth for CIR from email with [facility] for admit 12/9 with next review date tbd.  Updates due to email listed above.       Employer:  Benefits:  Phone #: [phone]     Name:  Eff. Date: 03/28/2018     Deduct:     Out of Pocket Max:     Life Max:  CIR:  100%     SNF:  Outpatient: Co-Pay:  Home Health:     Co-Pay:  DME:     Co-Pay: Providers:  SECONDARY: UHC Medicare      Policy#:      Phone#:    Artist:       Phone#:    The Data Processing Manager" for patients in Inpatient Rehabilitation Facilities with attached "Privacy Act Statement-Health Care Records" was provided and verbally reviewed with: Patient and Family   Emergency Contact Information Contact Information       Name Relation Home Work Mobile    [place] Spouse [phone]   [phone]         Other Contacts   None on File        Current Medical History  Patient Admitting Diagnosis: critical illness myopathy   History of Present Illness: Pt is a 73 y/o male with PMH of hep C with advanced fibrosis, HTN, DM, remote tobacco use, who was admitted to [facility] on 11/24 with ARDS.  Pt had a routine colonoscopy on 11/24 and post-procedure O2 saturation 93% on RA but he was discharged home.  Procedure note indicate  routine uncomplicated procedure.  He presented to the ED at [facility] with ongoing shortness of breath, abdominal pain and back pain.  Pt endorses vomiting during the colonoscopy.  His O2 saturation on RA at [facility] was 88%, ABG showed acidoses and pO2 of 59.  Covid positive.  He initially was on NRB, then bipap with no improvement and ultimately required intubation before he was transferred to [facility] on 09/27/24.  He did require levophed  and precedex .  On 12/1 he was successfully extubated to 3L [place] and is currently on roomair.  He has completed 5 day course of antibiotics.  Cardiac workup due to possible septic cardiomyopathy showed LVEF 25-30% with global hypokinesis.  Cardiology anticipated recovery and f/u outpatient repeat echo in 3-4 weeks.  Therapy ongoing and pt demonstrates profound proximal>distal weakness, consistent with critical illness myopathy.  Recommendations for CIR.     Patient's medical record from [facility] has been reviewed by the rehabilitation admission coordinator and physician.   Past Medical History      Past Medical History:  Diagnosis Date   Allergy     Arthritis     ED (erectile dysfunction)     GERD (gastroesophageal reflux disease)      Occasionally   Hepatitis C     Hyperlipidemia     Hypertension            Has the patient had major surgery during 100 days prior to admission? No   Family History   family history includes Cancer in his father; Diabetes in his brother; Drug abuse in his brother; Hypertension in his brother, daughter, mother, sister, and son.   Current Medications  Current Medications    Current Facility-Administered Medications:    acetaminophen  (TYLENOL ) tablet 650 mg, 650 mg, Oral, Q6H PRN, Laron Agent, RPH, 650 mg at 10/11/24 0506   calcium  carbonate (TUMS - dosed in mg elemental calcium ) chewable tablet 200 mg of elemental calcium , 1 tablet, Oral, Q8H PRN, Gherghe, Costin M, MD   Chlorhexidine  Gluconate Cloth 2 %  PADS 6 each, 6 each, Topical, Daily, Desai, Nikita S, MD, 6 each at 10/07/24 2039   feeding supplement (ENSURE PLUS HIGH PROTEIN) liquid 237 mL, 237 mL, Oral, BID BM, Gherghe, Costin M, MD, 237 mL at 10/10/24 1530   guaiFENesin  (ROBITUSSIN) 100 MG/5ML liquid 10 mL, 10 mL, Oral, BID, Gherghe, Costin M, MD, 10 mL at 10/11/24 0900   heparin  injection 5,000 Units, 5,000 Units, Subcutaneous, Q8H, Tawkaliyar, Roya, DO, 5,000 Units at 10/11/24 0506   hydrOXYzine  (ATARAX ) tablet 10 mg, 10 mg, Oral, TID PRN, Laron Agent, RPH   insulin  aspart (novoLOG ) injection 0-20 Units, 0-20 Units, Subcutaneous, Q4H, Amilibia, Jaden, DO, 3 Units at 10/10/24 2136   insulin  glargine-yfgn (SEMGLEE ) injection 8 Units, 8 Units, Subcutaneous, Daily, Amilibia, Jaden, DO, 8 Units at 10/11/24 0935   lidocaine  (LIDODERM ) 5 % 1 patch, 1 patch, Transdermal, Daily, Amilibia, Jaden, DO, 1 patch at 10/11/24 0900   losartan  (COZAAR ) tablet 100 mg, 100 mg, Oral, Daily, Laron Agent, RPH, 100 mg at 10/11/24 0900   melatonin tablet 3 mg, 3 mg, Oral, QHS PRN, Opyd, Timothy S, MD, 3 mg at 10/10/24 2143   menthol  (CEPACOL) lozenge 3 mg, 1 lozenge, Oral, PRN, Opyd, Timothy S, MD, 3 mg at 10/09/24 2137   Oral care mouth rinse, 15 mL, Mouth Rinse, PRN, Desai, Nikita S, MD   Oral care mouth rinse, 15 mL, Mouth Rinse, TID, Baral, Dipti, MD, 15 mL at 10/11/24 0901     Patients Current Diet:  Diet Order                  DIET DYS 3 Room service appropriate? Yes with Assist; Fluid consistency: Thin  Diet effective now                         Precautions / Restrictions Precautions Precautions: Fall, Other (comment) Precaution/Restrictions Comments: coretrak Restrictions Weight Bearing Restrictions Per Provider Order: No    Has the patient had 2 or more falls or a fall with injury in the past year? No   Prior Activity Level Community (5-7x/wk): independent prior to admit, no DME, retired but still works part time, driving    Prior Functional Level Self Care: Did the patient need help bathing, dressing, using the toilet or eating? Independent   Indoor Mobility: Did the patient need assistance with walking from room to room (with or without device)? Independent   Stairs: Did the patient need assistance with internal or external stairs (with  or without device)? Independent   Functional Cognition: Did the patient need help planning regular tasks such as shopping or remembering to take medications? Independent   Patient Information Are you of Hispanic, Latino/a,or Spanish origin?: A. No, not of Hispanic, Latino/a, or Spanish origin What is your race?: B. Black or African American Do you need or want an interpreter to communicate with a doctor or health care staff?: 0. No   Patient's Response To:  Health Literacy and Transportation Is the patient able to respond to health literacy and transportation needs?: Yes Health Literacy - How often do you need to have someone help you when you read instructions, pamphlets, or other written material from your doctor or pharmacy?: Never In the past 12 months, has lack of transportation kept you from medical appointments or from getting medications?: No In the past 12 months, has lack of transportation kept you from meetings, work, or from getting things needed for daily living?: No   Home Assistive Devices / Equipment Home Equipment: None   Prior Device Use: Indicate devices/aids used by the patient prior to current illness, exacerbation or injury? None of the above   Current Functional Level Cognition   Orientation Level: Oriented X4    Extremity Assessment (includes Sensation/Coordination)   Upper Extremity Assessment: Left hand dominant, Generalized weakness, RUE deficits/detail, LUE deficits/detail RUE Deficits / Details: hx of rotator cuff injury with repair several years ago with mildly decreased shoulder AROM at baseline secondary to this; AROM otherwise WFL;  generalized weakness; decreased coordination but with noted improvements as compared to OT eval on 12/3 RUE Sensation: WNL RUE Coordination: decreased gross motor, decreased fine motor LUE Deficits / Details: AROM shoulder flexion to approximately 85 degrees and AAROM WFL; AROM otherwise WFL; generalized weakness; decreased coordination but with noted improvements since OT eval on 12/3 LUE Sensation: WNL LUE Coordination: decreased fine motor, decreased gross motor  Lower Extremity Assessment: Defer to PT evaluation     ADLs   Overall ADL's : Needs assistance/impaired Eating/Feeding: Set up, Minimal assistance, Sitting Grooming: Minimal assistance, Moderate assistance, Sitting Upper Body Bathing: Moderate assistance, Maximal assistance, Sitting Lower Body Bathing: Total assistance, +2 for physical assistance, +2 for safety/equipment, Sit to/from stand Upper Body Dressing : Contact guard assist, Minimal assistance, Sitting Lower Body Dressing: Maximal assistance, +2 for safety/equipment, Sit to/from stand, Cueing for compensatory techniques Toilet Transfer: Minimal assistance, +2 for safety/equipment, Ambulation, BSC/3in1, Rolling walker (2 wheels) Toilet Transfer Details (indicate cue type and reason): simulated bed to chair Toileting- Clothing Manipulation and Hygiene: Set up, Contact guard assist, Bed level, Total assistance, +2 for physical assistance, +2 for safety/equipment, Sit to/from stand Toileting - Clothing Manipulation Details (indicate cue type and reason): Set up to CGA for use of handheld urinal; Total assist +2 for pericare in stit/stand Functional mobility during ADLs: Minimal assistance, +2 for safety/equipment, Rolling walker (2 wheels) (with pt requiring frequent seated rest breaks) General ADL Comments: Pt with decreased activity tolerance, fatiguing quickly during tasks. Pt with noted improvements in motor planning during functional tasks this session, but continuing to  require mildly increased time for tasks.     Mobility   Overal bed mobility: Needs Assistance Bed Mobility: Sit to Supine Supine to sit: HOB elevated, Used rails, Mod assist Sit to supine: Mod assist General bed mobility comments: assist to manage LE off bed, pt able to push up to seated,     Transfers   Overall transfer level: Needs assistance Equipment used: Rolling walker (2 wheels)  Transfers: Sit to/from Stand, Bed to chair/wheelchair/BSC Sit to Stand: Min assist, +2 safety/equipment, From elevated surface Bed to/from chair/wheelchair/BSC transfer type:: Step pivot Squat pivot transfers: Max assist, +2 safety/equipment, +2 physical assistance Step pivot transfers: Min assist, +2 safety/equipment, +2 physical assistance Transfer via Lift Equipment: Stedy General transfer comment: minAx2 for power up from elevated bed surface and heavy minAx2 for power up from low recliner surface to come to standing in RW     Ambulation / Gait / Stairs / Wheelchair Mobility   Ambulation/Gait Ambulation/Gait assistance: Min assist, +2 safety/equipment Gait Distance (Feet): 12 Feet (+10) Assistive device: Rolling walker (2 wheels) Gait Pattern/deviations: Step-to pattern, Trunk flexed, Narrow base of support General Gait Details: min A for steadying, cues for upright posture, looking up and out and widening BoS. Pt requires 1x seated rest break due to fatigue Gait velocity: slowed Gait velocity interpretation: <1.31 ft/sec, indicative of household ambulator     Posture / Balance Balance Overall balance assessment: Needs assistance Sitting-balance support: Bilateral upper extremity supported, Single extremity supported, Feet supported Sitting balance-Leahy Scale: Fair Postural control: Posterior lean (mild) Standing balance support: Bilateral upper extremity supported, During functional activity, Reliant on assistive device for balance Standing balance-Leahy Scale: Poor Standing balance comment:  reliant on B UE support and assist of therapist     Special considerations/life events  Diabetic management yes    Previous Home Environment (from acute therapy documentation) Living Arrangements: Spouse/significant other Available Help at Discharge: Family, Available 24 hours/day Type of Home: House Home Layout: Laundry or work area in basement, One level Home Access: Stairs to enter Entrance Stairs-Rails: Right, Left Entrance Stairs-Number of Steps: 3 Bathroom Shower/Tub: Engineer, Manufacturing Systems: Standard Home Care Services: No   Discharge Living Setting Plans for Discharge Living Setting: Patient's home, Lives with (comment) (spouse) Type of Home at Discharge: House Discharge Home Layout: One level Discharge Home Access: Stairs to enter Entrance Stairs-Rails: None Entrance Stairs-Number of Steps: 3 Discharge Bathroom Shower/Tub: Tub/shower unit Discharge Bathroom Toilet: Standard Discharge Bathroom Accessibility: Yes How Accessible: Accessible via walker Does the patient have any problems obtaining your medications?: No   Social/Family/Support Systems Patient Roles: Spouse Anticipated Caregiver: spouse, [name] Anticipated Industrial/product Designer Information: [phone] Ability/Limitations of Caregiver: none stated Caregiver Availability: 24/7 Discharge Plan Discussed with Primary Caregiver: Yes Is Caregiver In Agreement with Plan?: Yes   Goals Patient/Family Goal for Rehab: PT/OT min assist, SLP mod I Expected length of stay: 18-21 days Additional Information: Discharge plan: return to patients home, previous living environment, with spouse able to provide 24/7 care.  They also have a 24 y/o child living with them and family next door Pt/Family Agrees to Admission and willing to participate: Yes Program Orientation Provided & Reviewed with Pt/Caregiver Including Roles  & Responsibilities: Yes   Decrease burden of Care through IP rehab admission: n/a    Possible need for SNF placement upon discharge: Not anticipated.  Plan for discharge home with spouse to previous living environment.  Spouse can provide 24/7 supervision/assist and they have family next door as well.    Patient Condition: This patient's condition remains as documented in the consult dated 10/07/24, in which the Rehabilitation Physician determined and documented that the patient's condition is appropriate for intensive rehabilitative care in an inpatient rehabilitation facility. Will admit to inpatient rehab today.   Preadmission Screen Completed By:  Reche FORBES Lowers, PT, DPT12/06/2024 12:53 PM ______________________________________________________________________   Discussed status with Dr. Cornelio on 10/12/24  at 10:04 AM  and  received approval for admission today.   Admission Coordinator:  Kimmerly Lora E Solara Goodchild, PT, time 10:04 AM Pattricia 10/12/24     Assessment/Plan: Diagnosis: ICU myopathy Does the need for close, 24 hr/day Medical supervision in concert with the patient's rehab needs make it unreasonable for this patient to be served in a less intensive setting? Yes Co-Morbidities requiring supervision/potential complications: ARDS, HTN, Hx of Hep C with fibrosis; transaminitis; azotemia; hyperkalemia;  Due to bladder management, bowel management, safety, skin/wound care, disease management, medication administration, pain management, and patient education, does the patient require 24 hr/day rehab nursing? Yes Does the patient require coordinated care of a physician, rehab nurse, PT, OT, and SLP to address physical and functional deficits in the context of the above medical diagnosis(es)? Yes Addressing deficits in the following areas: balance, endurance, locomotion, strength, transferring, bowel/bladder control, bathing, dressing, feeding, grooming, and toileting Can the patient actively participate in an intensive therapy program of at least 3 hrs of therapy 5 days a week? Yes The  potential for patient to make measurable gains while on inpatient rehab is good Anticipated functional outcomes upon discharge from inpatient rehab: min assist PT, min assist OT, modified independent SLP Estimated rehab length of stay to reach the above functional goals is: 18-22 days Anticipated discharge destination: Home 10. Overall Rehab/Functional Prognosis: good     MD Signature:            Revision History  Date/Time User Provider Type Action  10/12/2024  1:17 PM Cornelio Bouchard, MD Physician Sign  10/12/2024 10:04 AM Butler Reche BRAVO, PT Rehab Admission Coordinator Share  10/11/2024 12:53 PM Butler Reche BRAVO, PT Rehab Admission Coordinator Share  10/08/2024  3:10 PM Butler Reche BRAVO, PT Rehab Admission Coordinator Share  10/07/2024  1:02 PM Butler Reche BRAVO, PT Rehab Admission Coordinator Share   View Details Report

## 2024-10-13 LAB — COMPREHENSIVE METABOLIC PANEL WITH GFR
ALT: 205 U/L — ABNORMAL HIGH (ref 0–44)
AST: 90 U/L — ABNORMAL HIGH (ref 15–41)
Albumin: 2.2 g/dL — ABNORMAL LOW (ref 3.5–5.0)
Alkaline Phosphatase: 72 U/L (ref 38–126)
Anion gap: 8 (ref 5–15)
BUN: 24 mg/dL — ABNORMAL HIGH (ref 8–23)
CO2: 23 mmol/L (ref 22–32)
Calcium: 8.5 mg/dL — ABNORMAL LOW (ref 8.9–10.3)
Chloride: 101 mmol/L (ref 98–111)
Creatinine, Ser: 1.13 mg/dL (ref 0.61–1.24)
GFR, Estimated: >60 mL/min (ref >60)
Glucose, Bld: 92 mg/dL (ref 70–99)
Potassium: 4.5 mmol/L (ref 3.5–5.1)
Sodium: 132 mmol/L — ABNORMAL LOW (ref 135–145)
Total Bilirubin: 0.8 mg/dL (ref 0.0–1.2)
Total Protein: 6.6 g/dL (ref 6.5–8.1)

## 2024-10-13 LAB — CBC WITH DIFFERENTIAL/PLATELET
Abs Immature Granulocytes: 0.3 K/uL — ABNORMAL HIGH (ref 0.00–0.07)
Basophils Absolute: 0 K/uL (ref 0.0–0.1)
Basophils Relative: 0 %
Eosinophils Absolute: 0 K/uL (ref 0.0–0.5)
Eosinophils Relative: 0 %
HCT: 35.9 % — ABNORMAL LOW (ref 39.0–52.0)
Hemoglobin: 12 g/dL — ABNORMAL LOW (ref 13.0–17.0)
Immature Granulocytes: 4 %
Lymphocytes Relative: 14 %
Lymphs Abs: 1.1 K/uL (ref 0.7–4.0)
MCH: 28.4 pg (ref 26.0–34.0)
MCHC: 33.4 g/dL (ref 30.0–36.0)
MCV: 85.1 fL (ref 80.0–100.0)
Monocytes Absolute: 1 K/uL (ref 0.1–1.0)
Monocytes Relative: 12 %
Neutro Abs: 5.8 K/uL (ref 1.7–7.7)
Neutrophils Relative %: 70 %
Platelets: 404 K/uL — ABNORMAL HIGH (ref 150–400)
RBC: 4.22 MIL/uL (ref 4.22–5.81)
RDW: 14.9 % (ref 11.5–15.5)
WBC: 8.3 K/uL (ref 4.0–10.5)
nRBC: 0 % (ref 0.0–0.2)

## 2024-10-13 LAB — GLUCOSE, CAPILLARY
Glucose-Capillary: 85 mg/dL (ref 70–99)
Glucose-Capillary: 74 mg/dL (ref 70–99)
Glucose-Capillary: 120 mg/dL — ABNORMAL HIGH (ref 70–99)
Glucose-Capillary: 105 mg/dL — ABNORMAL HIGH (ref 70–99)

## 2024-10-13 MED ORDER — ENOXAPARIN SODIUM 40 MG/0.4ML IJ SOSY
40.0000 mg | PREFILLED_SYRINGE | INTRAMUSCULAR | Status: DC
  Administered 2024-10-13 – 2024-10-17 (×5): 40 mg via SUBCUTANEOUS
  Filled 2024-10-13 (×5): qty 0.4

## 2024-10-13 MED ORDER — BOOST PLUS PO LIQD
237.0000 mL | Freq: Three times a day (TID) | ORAL | Status: DC
  Administered 2024-10-13 – 2024-10-18 (×11): 237 mL via ORAL
  Filled 2024-10-13 (×17): qty 237

## 2024-10-13 MED ORDER — TRAZODONE HCL 50 MG PO TABS
50.0000 mg | ORAL_TABLET | Freq: Every day | ORAL | Status: DC
  Administered 2024-10-13 – 2024-10-17 (×5): 50 mg via ORAL
  Filled 2024-10-13 (×5): qty 1

## 2024-10-13 NOTE — Progress Notes (Signed)
°   10/13/24 1135  Spiritual Encounters  Type of Visit Attempt (pt unavailable)  Care provided to: Pt not available  Reason for visit Advance directives  OnCall Visit No    Chaplain attempted visit, however pt Bryce Martinez was asleep. Chaplains will follow up.

## 2024-10-13 NOTE — Progress Notes (Signed)
 Inpatient Rehabilitation Care Coordinator Assessment and Plan Patient Details  Name: Bryce Martinez MRN: [id number] Date of Birth: [birth year]  Today's Date: 10/13/2024  Hospital Problems: Principal Problem:   Critical illness myopathy  Past Medical History:  Past Medical History:  Diagnosis Date   Allergy    Arthritis    ED (erectile dysfunction)    GERD (gastroesophageal reflux disease)    Occasionally   Hepatitis C    Hyperlipidemia    Hypertension    Past Surgical History:  Past Surgical History:  Procedure Laterality Date   APPENDECTOMY     CHOLECYSTECTOMY N/A 04/25/2014   Procedure: LAPAROSCOPIC CHOLECYSTECTOMY;  Surgeon: Oneil DELENA Budge, MD;  Location: [facility];  Service: General;  Laterality: N/A;   COLONOSCOPY N/A 09/01/2019   Procedure: COLONOSCOPY;  Surgeon: Golda Claudis PENNER, MD;  Location: [facility] ENDO SUITE;  Service: Endoscopy;  Laterality: N/A;  930   COLONOSCOPY N/A 09/27/2024   Procedure: COLONOSCOPY;  Surgeon: Eartha Angelia Sieving, MD;  Location: [facility] END[facility] SUITE;  Service: Gastroenterology;  Laterality: N/A;  7:30am, ASA 1-2   HEMICOLECTOMY Right    lap hand assisted   POLYPECTOMY  09/01/2019   Procedure: POLYPECTOMY;  Surgeon: Golda Claudis PENNER, MD;  Location: [facility];  Service: Endoscopy;;  colon   right shoulder surgery Right 03/2005   rotator cuff repair   Social History:  reports that he quit smoking about 52 years ago. His smoking use included cigarettes. He started smoking about 67 years ago. He has a 4.4 pack-year smoking history. He has been exposed to tobacco smoke. He has never used smokeless tobacco. He reports that he does not currently use alcohol. He reports that he does not use drugs.  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[age]) Education: some college Primary School Teacher - How often do you need to have someone help you when you read instructions, pamphlets, or other written material from your doctor or pharmacy?: Never Writes: Yes Employment Status: Employed 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 to Work Plans: TBD Marine Scientist Issues: Denies Guardian/Conservator: N/A   Abuse/Neglect Abuse/Neglect Assessment Can Be Completed: Yes Physical Abuse: Denies Verbal Abuse: Denies Sexual Abuse: Denies Exploitation of patient/patient's resources: Denies Self-Neglect: Denies  Patient response to: Social Isolation - How often do you feel lonely or isolated from those around you?: Rarely  Emotional Status Pt's affect, behavior and adjustment status: Pt in good spirits at time of visit, just tired Recent Psychosocial Issues: Denies Psychiatric History: Denies Substance Abuse History: Denies  Patient / Family Perceptions, Expectations & Goals Pt/Family understanding of illness & functional limitations: Pt and wife have a general understanding of care needs Premorbid pt/family roles/activities: Independent Anticipated changes in roles/activities/participation: Assistance with ADLs/IADLs Pt/family expectations/goals: Pt goal is to work onbuilding up his Chief Of Staff: None Premorbid Home Care/DME Agencies: None Transportation available at discharge: wife Is the patient able to respond to transportation needs?: Yes In the past 12 months, has lack of transportation kept you from medical appointments or from getting medications?: No In the past 12 months, has lack of transportation kept you from meetings, work, or from getting things needed for  daily living?: No Resource referrals recommended: Neuropsychology  Discharge Planning Living Arrangements: Spouse/significant other Support Systems: Spouse/significant other Type of Residence: Private residence Insurance Resources: Media Planner (specify) ([place]- It Trainer) Financial Resources: Tree Surgeon, Employment Financial Screen Referred: No Living Expenses: Banker Management: Spouse, Patient Does the patient have any problems obtaining your medications?: No Home Management: Both manage homecare needs Patient/Family Preliminary Plans: Wife Care Coordinator Barriers to Discharge: Decreased caregiver support, Lack of/limited family support, Insurance for SNF coverage Care Coordinator Anticipated Follow Up Needs: HH/OP  Clinical Impression SW met with pt an pt wife in room to introduce self, explain role, and discuss discharge process. Pt has no DME. No HCPOA. Pt aware SW will order DME through [place] once all final DME is confirmed.   Innocence Schlotzhauer A Deshanti Adcox 10/13/2024, 4:21 PM

## 2024-10-13 NOTE — Evaluation (Signed)
 Physical Therapy Assessment and Plan  Patient Details  Name: Bryce Martinez MRN: [id number] Date of Birth: 20-Dec-1950  PT Diagnosis: Abnormality of gait, Difficulty walking, and Muscle weakness Rehab Potential: Good ELOS: 5-7 days   Today's Date: 10/13/2024 PT Individual Time: 8699-8587 PT Individual Time Calculation (min): 72 min    Hospital Problem: Principal Problem:   Critical illness myopathy   Past Medical History:  Past Medical History:  Diagnosis Date   Allergy    Arthritis    ED (erectile dysfunction)    GERD (gastroesophageal reflux disease)    Occasionally   Hepatitis C    Hyperlipidemia    Hypertension    Past Surgical History:  Past Surgical History:  Procedure Laterality Date   APPENDECTOMY     CHOLECYSTECTOMY N/A 04/25/2014   Procedure: LAPAROSCOPIC CHOLECYSTECTOMY;  Surgeon: Oneil DELENA Budge, MD;  Location: [facility];  Service: General;  Laterality: N/A;   COLONOSCOPY N/A 09/01/2019   Procedure: COLONOSCOPY;  Surgeon: Golda Claudis PENNER, MD;  Location: [facility];  Service: Endoscopy;  Laterality: N/A;  930   COLONOSCOPY N/A 09/27/2024   Procedure: COLONOSCOPY;  Surgeon: Eartha Angelia Sieving, MD;  Location: [facility];  Service: Gastroenterology;  Laterality: N/A;  7:30am, ASA 1-2   HEMICOLECTOMY Right    lap hand assisted   POLYPECTOMY  09/01/2019   Procedure: POLYPECTOMY;  Surgeon: Golda Claudis PENNER, MD;  Location: [facility];  Service: Endoscopy;;  colon   right shoulder surgery Right 03/2005   rotator cuff repair    Assessment & Plan Clinical Impression: Patient is a 73 y.o. year old male with  PMHx HLD, HTN, Hep C s/p posttreatment,advanced fibrosis. The patient presented to [facility] on 11/24 for routine screening colonoscopy subsequently complicated by aspiration during the procedure. He was monitored on room air and discharged home. While at home his respiratory status worsened and he returned back to the hospital same day with  complaints of SOB, abdominal pain and back pain. He initially required 15 L oxygen, ABG showed metabolic acidosis. He was initially placed on BIPAP and was treated with IV Unasyn , DuoNeb and steroids in the ER. He was intubated and admitted by CCM and transferred to [place] course complicated by acute systolic CHF where an echo revealed an of EF 25 to 30%, this was felt to be due to septic cardiomyopathy and HF.  AKI and hyperkalemia which later resolved after diuresis and lokelma . He was incidentally found to be Covid positive by PCR.  Cortrak was placed on 11/28 prior to extubation. A swallow evaluation completed on 12/05 showed mild dysphagia, SLP reccommended mechanical soft diet and approved upgrade as patient desires. Cortak discontinued on 12/06.  Prior to arrival the patient was independent working and driving. He is retired and lives in a one level home with 3 steps to enter. He currently requires min assist for RW, CGA for bed mobility and transfers. Therapy evaluations completed due to patient decreased functional mobility was admitted for a comprehensive rehab program.    Patient currently requires CGA with mobility secondary to muscle weakness, decreased cardiorespiratoy endurance, and decreased standing balance and decreased balance strategies.  Prior to hospitalization, patient was independent  with mobility and lived with Spouse in a House home.  Home access is 3Stairs to enter.  Patient will benefit from skilled PT intervention to maximize safe functional mobility, minimize fall risk, and decrease caregiver burden for planned discharge home with intermittent assist.  Anticipate patient  will benefit from follow up OP at discharge.  PT - End of Session Activity Tolerance: Tolerates 30+ min activity with multiple rests Endurance Deficit: Yes PT Assessment Rehab Potential (ACUTE/IP ONLY): Good PT Barriers to Discharge: Home environment access/layout PT Barriers to  Discharge Comments: endurance, pain PT Patient demonstrates impairments in the following area(s): Balance;Endurance;Pain PT Transfers Functional Problem(s): Bed Mobility;Bed to Chair;Car;Furniture PT Locomotion Functional Problem(s): Ambulation;Stairs PT Plan PT Intensity: Minimum of 1-2 x/day ,45 to 90 minutes PT Frequency: 5 out of 7 days PT Duration Estimated Length of Stay: 5-7 days PT Treatment/Interventions: Ambulation/gait training;Discharge planning;Functional mobility training;Psychosocial support;Therapeutic Activities;Visual/perceptual remediation/compensation;Balance/vestibular training;Disease management/prevention;Neuromuscular re-education;Skin care/wound management;Therapeutic Exercise;Wheelchair propulsion/positioning;Cognitive remediation/compensation;DME/adaptive equipment instruction;Pain management;Splinting/orthotics;UE/LE Strength taining/ROM;Community reintegration;Functional electrical stimulation;Patient/family education;Stair training;UE/LE Coordination activities PT Transfers Anticipated Outcome(s): Mod I PT Locomotion Anticipated Outcome(s): Mod I PT Recommendation Recommendations for Other Services: Therapeutic Recreation consult Follow Up Recommendations: Outpatient PT Patient destination: Home Equipment Recommended: None recommended by PT;To be determined   PT Evaluation Precautions/Restrictions Precautions Precautions: Fall Recall of Precautions/Restrictions: Intact Precaution/Restrictions Comments: orthostatic hypotension Restrictions Weight Bearing Restrictions Per Provider Order: No Pain Interference Pain Interference Pain Effect on Sleep: 2. Occasionally Pain Interference with Therapy Activities: 1. Rarely or not at all Pain Interference with Day-to-Day Activities: 1. Rarely or not at all Home Living/Prior Functioning Home Living Living Arrangements: Spouse/significant other Available Help at Discharge: Family;Available 24 hours/day Type of  Home: House Home Access: Stairs to enter [employer] of Steps: 3 Entrance Stairs-Rails: Can reach both Home Layout: Laundry or work area in basement;One level (7 steps to access man cave in the basement) Bathroom Shower/Tub: Engineer, [employer]: Standard  Lives With: Spouse Prior Function Level of Independence: Independent with basic ADLs;Independent with gait;Independent with transfers;Independent with homemaking with ambulation  Able to Take Stairs?: Yes Driving: Yes Vocation: Part time employment Vision/Perception  Vision - History Ability to See in Adequate Light: 0 Adequate Perception Perception: Within Functional Limits Praxis Praxis: WFL  Cognition Overall Cognitive Status: Within Functional Limits for tasks assessed Arousal/Alertness: Awake/alert Orientation Level: Oriented X4 Year: 2025 Month: December Day of Week: Correct Attention: Focused;Sustained;Selective Focused Attention: Appears intact Sustained Attention: Appears intact Selective Attention: Appears intact Memory: Appears intact Awareness: Appears intact Problem Solving: Appears intact Safety/Judgment: Appears intact Sensation Sensation Light Touch: Appears Intact Proprioception: Appears Intact Coordination Gross Motor Movements are Fluid and Coordinated: Yes Fine Motor Movements are Fluid and Coordinated: Yes Motor  Motor Motor: Within Functional Limits  Trunk/Postural Assessment  Cervical Assessment Cervical Assessment: Within Functional Limits Thoracic Assessment Thoracic Assessment: Within Functional Limits Lumbar Assessment Lumbar Assessment: Within Functional Limits Postural Control Postural Control: Within Functional Limits  Balance Balance Balance Assessed: Yes Static Sitting Balance Static Sitting - Balance Support: Feet supported Static Sitting - Level of Assistance: 6: Modified independent (Device/Increase time) Dynamic Sitting Balance Dynamic Sitting -  Level of Assistance: 6: Modified independent (Device/Increase time) Static Standing Balance Static Standing - Balance Support: During functional activity Static Standing - Level of Assistance: 5: Stand by assistance Dynamic Standing Balance Dynamic Standing - Balance Support: During functional activity Dynamic Standing - Level of Assistance: 4: Min assist Extremity Assessment  RLE Assessment RLE Assessment: Exceptions to [facility] General Strength Comments: 3+/5 LLE Assessment LLE Assessment: Exceptions to [facility] General Strength Comments: grossly 3+/5 -did not formally assess  Care Tool Care Tool Bed Mobility Roll left and right activity   Roll left and right assist level: Independent with assistive device    Sit to lying activity   Sit to  lying assist level: Independent with assistive device    Lying to sitting on side of bed activity   Lying to sitting on side of bed assist level: the ability to move from lying on the back to sitting on the side of the bed with no back support.: Independent with assistive device     Care Tool Transfers Sit to stand transfer   Sit to stand assist level: Contact Guard/Touching assist    Chair/bed transfer   Chair/bed transfer assist level: Contact Guard/Touching assist    Car transfer   Car transfer assist level: Contact Guard/Touching assist      Care Tool Locomotion Ambulation   Assist level: Contact Guard/Touching assist Assistive device: No Device Max distance: 86  Walk 10 feet activity   Assist level: Contact Guard/Touching assist Assistive device: No Device   Walk 50 feet with 2 turns activity   Assist level: Contact Guard/Touching assist Assistive device: No Device  Walk 150 feet activity Walk 150 feet activity did not occur: Safety/medical concerns (fatigue/SOB)      Walk 10 feet on uneven surfaces activity   Assist level: Minimal Assistance - Patient > 75% Assistive device:  (HHA)  Stairs   Assist level: Contact  Guard/Touching assist Stairs assistive device: 2 hand rails Max number of stairs: 8  Walk up/down 1 step activity   Walk up/down 1 step (curb) assist level: Contact Guard/Touching assist Walk up/down 1 step or curb assistive device: 2 hand rails  Walk up/down 4 steps activity   Walk up/down 4 steps assist level: Contact Guard/Touching assist Walk up/down 4 steps assistive device: 2 hand rails  Walk up/down 12 steps activity Walk up/down 12 steps activity did not occur: Safety/medical concerns      Pick up small objects from floor   Pick up small object from the floor assist level: Minimal Assistance - Patient > 75%    Wheelchair Is the patient using a wheelchair?: Yes Type of Wheelchair: Manual   Wheelchair assist level: Supervision/Verbal cueing Max wheelchair distance: 150  Wheel 50 feet with 2 turns activity   Assist Level: Supervision/Verbal cueing  Wheel 150 feet activity   Assist Level: Supervision/Verbal cueing    Refer to Care Plan for Long Term Goals  SHORT TERM GOAL WEEK 1 PT Short Term Goal 1 (Week 1): STG=LTG 2/2 ELOS  Recommendations for other services: Neuropsych  Skilled Therapeutic Intervention Mobility Bed Mobility Bed Mobility: Rolling Right;Rolling Left;Supine to Sit;Sit to Supine Rolling Right: Independent with assistive device Rolling Left: Independent with assistive device Supine to Sit: Independent with assistive device Sit to Supine: Independent with assistive device Transfers Transfers: Sit to Stand;Stand to Sit;Stand Pivot Transfers Sit to Stand: Contact Guard/Touching assist Stand to Sit: Contact Guard/Touching assist Stand Pivot Transfers: Contact Guard/Touching assist Transfer (Assistive device): None Locomotion  Gait Ambulation: Yes Gait Distance (Feet): 86 Feet Assistive device: None Gait Gait: Yes Gait Pattern: Impaired Gait Pattern: Step-through pattern;Decreased stride length;Narrow base of support Gait velocity: slowed Stairs  / Additional Locomotion Stairs: Yes Stairs Assistance: Contact Guard/Touching assist Stair Management Technique: Two rails Number of Stairs: 8 Height of Stairs: 6 Wheelchair Mobility Wheelchair Mobility: Yes Wheelchair Assistance: Doctor, General Practice: Both lower extermities Wheelchair Parts Management: Needs assistance Distance: 150+   Discharge Criteria: Patient will be discharged from PT if patient refuses treatment 3 consecutive times without medical reason, if treatment goals not met, if there is a change in medical status, if patient makes no progress towards goals or if patient  is discharged from hospital.  The above assessment, treatment plan, treatment alternatives and goals were discussed and mutually agreed upon: by patient  Today's Interventions  Evaluation completed (see details above and below) with education on PT POC and goals and individual treatment initiated with focus on gait training.   Pt supine asleep in bed upon arrival. Pt awoken and agreeable to therapy. Pt denies any pain.   Supine to sit without use of bed features and mod I (increased time).   Pt performed ambulatory transfer to toilet with no AD and CGA. Pt continent of bladder. Pt performed pericare and managed pants with supervision.   Pt ambulated 1x86, 1x51  feet with no AD and CGA, progressing to close supervison pt required seated rest break 2/2 fatigue/SOB. Pt endorses feeling unsteady. Pt intermittently reaching for rails and furniture. Vitals assessed: BP 91/63 HR 87 SpO2 100 RA-pt asymptomatic. Donned knee high ted hose B, and encouraged fluids.   Stair navigation x8 6 inch steps with CGA with B handrails and reciprocal gait, verbal cues provided for LE positioning.   Five times Sit to Stand Test (FTSS) Method: Use a straight back chair with a solid seat that is 16-18 high. Ask participant to sit on the chair with arms folded across their chest.    Instructions: Stand up and sit down as quickly as possible 5 times, keeping your arms folded across your chest.   Measurement: Stop timing when the participant stands the 5th time.  TIME: _30.25_____ (in seconds)   Times > 13.6 seconds is associated with increased disability and morbidity (Guralnik, 2000) Times > 15 seconds is predictive of recurrent falls in healthy individuals aged 38 and older (Buatois, et al., 2008) Normal performance values in community dwelling individuals aged 57 and older (Bohannon, 2006): 60-69 years: 11.4 seconds 70-79 years: 12.6 seconds 80-89 years: 14.8 seconds  MCID: >= 2.3 seconds for Vestibular Disorders (Meretta, 2006)   TUG: 15.26 seconds   [facility] Doreene Orris, [place], DPT  10/13/2024, 4:36 PM

## 2024-10-13 NOTE — Plan of Care (Signed)
°  Problem: Sit to Stand Goal: LTG:  Patient will perform sit to stand in prep for activites of daily living with assistance level (OT) Description: LTG:  Patient will perform sit to stand in prep for activites of daily living with assistance level (OT) Flowsheets (Taken 10/13/2024 1018) LTG: PT will perform sit to stand in prep for activites of daily living with assistance level: Independent   Problem: RH Grooming Goal: LTG Patient will perform grooming w/assist,cues/equip (OT) Description: LTG: Patient will perform grooming with assist, with/without cues using equipment (OT) Flowsheets (Taken 10/13/2024 1018) LTG: Pt will perform grooming with assistance level of: (standing) Independent with assistive device  Note: In standing    Problem: RH Bathing Goal: LTG Patient will bathe all body parts with assist levels (OT) Description: LTG: Patient will bathe all body parts with assist levels (OT) Flowsheets (Taken 10/13/2024 1018) LTG: Pt will perform bathing with assistance level/cueing: Independent with assistive device  LTG: Position pt will perform bathing:  Shower  Sit to Stand   Problem: RH Dressing Goal: LTG Patient will perform lower body dressing w/assist (OT) Description: LTG: Patient will perform lower body dressing with assist, with/without cues in positioning using equipment (OT) Flowsheets (Taken 10/13/2024 1018) LTG: Pt will perform lower body dressing with assistance level of: Independent with assistive device   Problem: RH Toileting Goal: LTG Patient will perform toileting task (3/3 steps) with assistance level (OT) Description: LTG: Patient will perform toileting task (3/3 steps) with assistance level (OT)  Flowsheets (Taken 10/13/2024 1018) LTG: Pt will perform toileting task (3/3 steps) with assistance level: Independent with assistive device   Problem: RH Toilet Transfers Goal: LTG Patient will perform toilet transfers w/assist (OT) Description: LTG: Patient will  perform toilet transfers with assist, with/without cues using equipment (OT) Flowsheets (Taken 10/13/2024 1018) LTG: Pt will perform toilet transfers with assistance level of: Independent with assistive device   Problem: RH Tub/Shower Transfers Goal: LTG Patient will perform tub/shower transfers w/assist (OT) Description: LTG: Patient will perform tub/shower transfers with assist, with/without cues using equipment (OT) Flowsheets (Taken 10/13/2024 1018) LTG: Pt will perform tub/shower stall transfers with assistance level of: Independent with assistive device

## 2024-10-13 NOTE — Progress Notes (Signed)
 Initial Nutrition Assessment  DOCUMENTATION CODES:   Non-severe (moderate) malnutrition in context of acute illness/injury  INTERVENTION:  Boost Plus po TID, each supplement provides 360 kcal and 14 grams of protein  Liberalized diet to regular to provide increased options and promote adequate intake Approved following FEES 12/5 by SLP Wife allowed to bring in outside food for pt  Encouraged adequate protein intake at all meals and intake of supplements  NUTRITION DIAGNOSIS:   Moderate Malnutrition related to acute illness as evidenced by mild muscle depletion, mild fat depletion, moderate fat depletion.  GOAL:   Patient will meet greater than or equal to 90% of their needs  MONITOR:   PO intake, Supplement acceptance  REASON FOR ASSESSMENT:   Consult Assessment of nutrition requirement/status  ASSESSMENT:   Patient w/ PMH DM2, HTN, HLD, GERD, Hepatitis C, and advanced fibrosis. Recent admission after aspirating at routine coloscopy and was found to have ARDS requiring intubation. Hospital course complicated by intubation, Cortrak placement, and dysphagia but Cortrak removed and diet advanced prior to admission to CIR. Admitted to CIR to work on functional mobility.  Recent Admission Summary: 11/24 Patient presented to [facility] ED after aspirating at routine colonoscopy, was intubated, and transferred to [facility] ICU . 11/26 Trickle TF initiated  11/28 Cortrak placed 12/01 Extubated 12/02 SLP swallow eval = DYS3/thin 12/05 FEES shows mild dysphagia, SLP recommends mech soft with thin liquids, OK to upgrade to reg/thin when patient desires; patient with very poor PO intake stating nothing tastes good here at hospital; switched to nocturnal feedings; wife to bring food 12/06 attending discontinued Cortrak  Admission to CIR: 12/9 admitted on DYS 3 12/10 upgraded to regular diet per pt preference  Spoke with pt who was resting in bed at time of assessment. Pt endorses  fatigue after first few therapy sessions in rehab, but otherwise feeling good. Pt reports improved appetite, only 1 meal recorded which showed pt ate 80% of breakfast. Pt requested advancing diet to regular to allow more options and allow wife to bring in food from home. Per SLP note 12/5, pt passed FEES and could progress to regular diet whenever pt was ready. Made adjustment to diet order. Pt reports no GI discomforts at this time, but stated he was previously constipated which was making him feel bloated. Pt also reports bloating feeling with Ensure shakes and believes he may be lactose intolerant, discussed trying Lactose free Boost Plus, pt agreeable.  PTA, pt reports good appetite, eating about 2-3 x per day. Pt reports if he skips a meal it is usually lunch due to busy work schedule, but pt always eats a hearty breakfast and dinner. Pt reports he does not avoid any foods and eats a variety of foods. Pt eats some type of meat at most meals and will have home cooked food w/ vegetables for dinner. Lunch is usually fast food or from a restaurant due to being on the road when working.   Per chart review, pt has lost 6% wt in 2 months which is not clinically significant for the timeframe but in combination with physical exam could become significant. Physical exam shows mild to moderate fat depletion and mild to moderate muscle depletion, indicative of malnutrition. Suspect malnutrition in the context of acute illness given pt's prolonged hospital stay and decreased appetite since having Cortrak removed. Malnutrition will likely resolve as pt continues to work with therapy and make progress. Encouraged adequate protein intake while admitted to help maintain muscle integrity and  prevent further losses.   Average Meal Completion: 12/10: 80% average intake x 1 recorded meals  Nutritionally Relevant Medications: Vitamin C  1000 mg daily SSI 0-9 units TID Boost Plus TID Senna BID Zinc  sulfate daily  Labs  reviewed: CBG x 24 h: 74-98 mg/dL Sodium 867 BUN 24 AST 09/JOU 205 No recent A1c    NUTRITION - FOCUSED PHYSICAL EXAM:  Flowsheet Row Most Recent Value  Orbital Region Mild depletion  Upper Arm Region Moderate depletion  Thoracic and Lumbar Region Moderate depletion  Buccal Region Mild depletion  Temple Region Moderate depletion  Clavicle Bone Region Mild depletion  Clavicle and Acromion Bone Region Mild depletion  Scapular Bone Region No depletion  Dorsal Hand No depletion  Patellar Region Mild depletion  Anterior Thigh Region Mild depletion  Posterior Calf Region Mild depletion  Edema (RD Assessment) None  Hair Reviewed  Eyes Reviewed  Mouth Reviewed  Skin Reviewed  Nails Reviewed    Diet Order:   Diet Order             Diet regular Room service appropriate? Yes with Assist; Fluid consistency: Thin  Diet effective now                   EDUCATION NEEDS:   Education needs have been addressed  Skin:  Skin Assessment: Reviewed RN Assessment  Last BM:  12/10 type 6  Height:   Ht Readings from Last 1 Encounters:  10/12/24 6' 1 (1.854 m)    Weight:   Wt Readings from Last 1 Encounters:  10/13/24 74.8 kg    Ideal Body Weight:  83.6 kg  BMI:  Body mass index is 21.76 kg/m.  Estimated Nutritional Needs:   Kcal:  2200-2400  Protein:  90-110g  Fluid:  >2 L    Josette Glance, MS, RDN, LDN Clinical Dietitian I Please reach out via secure chat

## 2024-10-13 NOTE — Plan of Care (Signed)
°  Problem: RH Balance Goal: LTG Patient will maintain dynamic standing balance (PT) Description: LTG:  Patient will maintain dynamic standing balance with assistance during mobility activities (PT) Flowsheets (Taken 10/13/2024 1641) LTG: Pt will maintain dynamic standing balance during mobility activities with:: Independent with assistive device    Problem: Sit to Stand Goal: LTG:  Patient will perform sit to stand with assistance level (PT) Description: LTG:  Patient will perform sit to stand with assistance level (PT) Flowsheets (Taken 10/13/2024 1641) LTG: PT will perform sit to stand in preparation for functional mobility with assistance level: Independent with assistive device   Problem: RH Bed to Chair Transfers Goal: LTG Patient will perform bed/chair transfers w/assist (PT) Description: LTG: Patient will perform bed to chair transfers with assistance (PT). Flowsheets (Taken 10/13/2024 1641) LTG: Pt will perform Bed to Chair Transfers with assistance level: Independent with assistive device    Problem: RH Car Transfers Goal: LTG Patient will perform car transfers with assist (PT) Description: LTG: Patient will perform car transfers with assistance (PT). Flowsheets (Taken 10/13/2024 1641) LTG: Pt will perform car transfers with assist:: Independent with assistive device    Problem: RH Furniture Transfers Goal: LTG Patient will perform furniture transfers w/assist (OT/PT) Description: LTG: Patient will perform furniture transfers  with assistance (OT/PT). Flowsheets (Taken 10/13/2024 1641) LTG: Pt will perform furniture transfers with assist:: Independent with assistive device    Problem: RH Floor Transfers Goal: LTG Patient will perform floor transfers w/assist (PT) Description: LTG: Patient will perform floor transfers with assistance (PT). Flowsheets (Taken 10/13/2024 1641) LTG: PT WILL PERFORM FLOOR TRANFERS  WITH  ASSIST:: Supervision/Verbal cueing   Problem: RH  Ambulation Goal: LTG Patient will ambulate in home environment (PT) Description: LTG: Patient will ambulate in home environment, # of feet with assistance (PT). Flowsheets (Taken 10/13/2024 1641) LTG: Pt will ambulate in home environ  assist needed:: Independent with assistive device LTG: Ambulation distance in home environment: 50 feet with LRAD Goal: LTG Patient will ambulate in community environment (PT) Description: LTG: Patient will ambulate in community environment, # of feet with assistance (PT). Flowsheets (Taken 10/13/2024 1641) LTG: Pt will ambulate in community environ  assist needed:: Supervision/Verbal cueing LTG: Ambulation distance in community environment: 150 feet with LRAD   Problem: RH Stairs Goal: LTG Patient will ambulate up and down stairs w/assist (PT) Description: LTG: Patient will ambulate up and down # of stairs with assistance (PT) Flowsheets (Taken 10/13/2024 1641) LTG: Pt will ambulate up/down stairs assist needed:: Independent with assistive device LTG: Pt will  ambulate up and down number of stairs: 12 steps with LRAD

## 2024-10-13 NOTE — Progress Notes (Signed)
 Occupational Therapy Session Note  Patient Details  Name: Bryce Martinez MRN: [id number] Date of Birth: 01-30-1951  Today's Date: 10/13/2024 OT Individual Time: 0930-1040 OT Individual Time Calculation (min): 70 min    Short Term Goals: Week 1:  OT Short Term Goal 1 (Week 1): STG=LTG mod I  Skilled Therapeutic Interventions/Progress Updates:  Pt resting in bed upon arrival with wife present. Initial focus on tub and TTB transfers in addition to DME recommendations. Pt's wife provided infor on TTB. CSW notifed of DME recommendation. Pt abel to step over into tub with hand placement on wall. Pt does not have grab bars. Pt requires CGA for stepping over into tub. Pt also practiced TTB transfers with supervision. Recommended use of TTB at home, for safety. Pt amb throughout unit without AD with close supervision. Pt fatigues quickly and requires multiple rest breaks throughout session. Pt practiced bed mobility on std bed, furniture transfers, carrying laundry basket, and bounce back activity standing on AirEx. No LOB noted. Pt returned to room and remained in bed. All needs within reach. Bed alarm activated.   Therapy Documentation Precautions:  Precautions Precautions: Fall Restrictions Weight Bearing Restrictions Per Provider Order: No   Pain: Pt denies pain this morning   Therapy/Group: Individual Therapy  Maritza Debby Mare 10/13/2024, 12:01 PM

## 2024-10-13 NOTE — Progress Notes (Signed)
 Inpatient Rehabilitation Admission Medication Review by a Pharmacist  A complete drug regimen review was completed for this patient to identify any potential clinically significant medication issues.  High Risk Drug Classes Is patient taking? Indication by Medication  Antipsychotic Yes Compazine  prn N/V  Anticoagulant Yes Sq heparin  - VTE ppx  Antibiotic No   Opioid No   Antiplatelet No   Hypoglycemics/insulin  Yes Insulin  - DM  Vasoactive Medication Yes Losartan  - HTN  Chemotherapy No   Other Yes Hydroxyzine  prn anxiety Lidocaine  patch - pain Vitamin C , Zinc  - wound healing     Type of Medication Issue Identified Description of Issue Recommendation(s)  Drug Interaction(s) (clinically significant)     Duplicate Therapy     Allergy     No Medication Administration End Date     Incorrect Dose     Additional Drug Therapy Needed     Significant med changes from prior encounter (inform family/care partners about these prior to discharge).    Other       Clinically significant medication issues were identified that warrant physician communication and completion of prescribed/recommended actions by midnight of the next day:  No  Name of provider notified for urgent issues identified:   Provider Method of Notification:     Pharmacist comments: None  Time spent performing this drug regimen review (minutes):  20 minutes  Thank you. Olam Monte, PharmD

## 2024-10-13 NOTE — Progress Notes (Signed)
 PROGRESS NOTE   Subjective/Complaints:  Pt reports restless sleep- not great- would be interested in stronger sleeping meds.  LBM overnight around 3am- good sized  No significant pain.   ROS:  Pt denies SOB, abd pain, CP, N/V/C/D, and vision changes Per HPI  Objective:   DG Abd 1 View Result Date: 10/12/2024 EXAM: 1 VIEW XRAY OF THE ABDOMEN 10/12/2024 06:29:00 PM COMPARISON: 10/02/2024 CLINICAL HISTORY: Abdominal bloating. FINDINGS: BOWEL: Continued diffuse gaseous distention of the bowel suggests it is ileus. This is similar to the prior study. SOFT TISSUES: Prior cholecystectomy. No abnormal calcifications. BONES: No acute fracture. IMPRESSION: 1. Continued diffuse gaseous distention of the bowel, similar to the prior study, suggests ileus. Electronically signed by: Franky Crease MD 10/12/2024 07:34 PM EST RP Workstation: [id number]   Recent Labs    10/13/24 0510  WBC 8.3  HGB 12.0*  HCT 35.9*  PLT 404*   Recent Labs    10/13/24 0510  NA 132*  K 4.5  CL 101  CO2 23  GLUCOSE 92  BUN 24*  CREATININE 1.13  CALCIUM  8.5*   No intake or output data in the 24 hours ending 10/13/24 0824      Physical Exam: Vital Signs Blood pressure 133/70, pulse 69, temperature 97.9 F (36.6 C), temperature source Oral, resp. rate 17, height 6' 1 (1.854 m), weight 74.8 kg, SpO2 100%.    General: awake, alert, appropriate,  appears fatigued as just woke up; wife at bedside; sitting EOB; NAD HENT: conjugate gaze; oropharynx  dry- licking lips CV: regular rate and rhythm; no JVD Pulmonary: CTA B/L; no W/R/R- good air movement GI: soft, less distended; not tymphanic; NT; hypoactive BS Psychiatric: appropriate- but appears very fatigued Neurological: Ox3 Musculoskeletal:     Cervical back: Neck supple.     Comments: Deltoids 2/5; Biceps/triceps 3+/5; WE/grip and FA 5-/5 B/L LE's- HF 3-/5; KE/KF 4/5 and DF/PF 5-/5 B/L  Skin:     General: Skin is warm and dry.     Comments: No skin breakdown on skin that's easily seen  Neurological:     Mental Status: He is oriented to person, place, and time.     Comments: Ox3 Intact to light touch in all 4 extremities Decreased DTR's throughout   Assessment/Plan: 1. Functional deficits which require 3+ hours per day of interdisciplinary therapy in a comprehensive inpatient rehab setting. Physiatrist is providing close team supervision and 24 hour management of active medical problems listed below. Physiatrist and rehab team continue to assess barriers to discharge/monitor patient progress toward functional and medical goals  Care Tool:  Bathing              Bathing assist       Upper Body Dressing/Undressing Upper body dressing        Upper body assist      Lower Body Dressing/Undressing Lower body dressing            Lower body assist       Toileting Toileting    Toileting assist       Transfers Chair/bed transfer  Transfers assist           Locomotion Ambulation  Ambulation assist              Walk 10 feet activity   Assist           Walk 50 feet activity   Assist           Walk 150 feet activity   Assist           Walk 10 feet on uneven surface  activity   Assist           Wheelchair     Assist               Wheelchair 50 feet with 2 turns activity    Assist            Wheelchair 150 feet activity     Assist          Blood pressure 133/70, pulse 69, temperature 97.9 F (36.6 C), temperature source Oral, resp. rate 17, height 6' 1 (1.854 m), weight 74.8 kg, SpO2 100%.  Medical Problem List and Plan: 1. Functional deficits secondary to ICU myopathy after prolonged ICU/intubation             -patient may  shower             -ELOS/Goals: 18-22 days- min A to supervision- SLP mod I            First day of evaluations-Con't CIR PT, OT   2.   Antithrombotics: -DVT/anticoagulation:  Mechanical: Sequential compression devices, below knee Bilateral lower extremities Pharmaceutical: Heparin   12/10- changed to Lovenox - not allergic and less shots             -antiplatelet therapy: N/A   3. Pain Management: Lidoderm  patch daily. Tylenol  prn    12/10- denies pain- 4. Mood/Behavior/Sleep: LCSW to follow for evaluation and support when available.              -antipsychotic agents: N/A             -anxiety: Atarax  10 mg prn              -sleep: Melatonin 3 mg    5. Neuropsych/cognition: This patient is capable of making decisions on his own behalf.   6. Skin/Wound Care: Routine pressure relief measures.    7. Fluids/Electrolytes/Nutrition: Monitor strict I&O and daily weights.  Follow up labs CBC/CMP. RD consulted for ongoing care. Patient found to be highly intolerant of lactose.              -Dys 3 with Mallie Pinion vs Ensure supplement              -Vitamin C  and Zinc     8. Acute Hypoxic resp failure due to ARDS: From aspiration PNA. Extubated on 12/1 and has been 98-100% on room air.  -Continue Ins spiro.    9. Acute Systolic CHF: LVEF 25 to 30%, global hypokinesis due to ARDS and cardiogenic shock. Repeat echo recommended in 3 to 4 weeks outpatient. Continue Losartan  100 mg daily.    10. HTN: Monitor BP per protocol. Continue Losartan .    12/10- BP well controlled- con't regimen 11. Covid +: felt to be related to older infection, PCR still positive. Asymptomatic, does not require precautions.              -Robitussin 10 ml BID    12. AKIAzotemia; Cr stable at 0.90.    12/10- Pt's BUN down to 24 form 37- max 50's- so doing better 13.  Hyperkalemia: Required Lokelma  x1, resolved.  Monitor Labs.    12/10- K+ 4.5 14. Hx of Hep C s/p treatment, advanced fibrosis: 12/5  AST/ALT 44/72. Monitor with labs.  F/u outpatient with GI.    15. T2DM? Wife and pt say never been dx'd with DM: Hgb A1c 5.5 09/2017, glucose has been stable  requiring minimal insulin .              -monitor BS ac/hs and use SSI, could d/c at some point.    16. Abdominal bloating/Ileus: KUB ordered, continue Senna S. LBM character not documented.    12/10- Pt had 1 BM yesterday late AM and at 3am- large- per KUB, had ileus yesterday evening- will  recheck KUB in AM and see since is going more regularly now.    I spent a total of 46   minutes on total care today- >50% coordination of care- due to  D/w pt and wife- also nursing- and review of labs, vitals and B/B as well as KUB independently-       LOS: 1 days A FACE TO FACE EVALUATION WAS PERFORMED  Contina Strain 10/13/2024, 8:24 AM

## 2024-10-13 NOTE — Progress Notes (Signed)
 Martinez, Megan, MD  Physician Physical Medicine and Rehabilitation   Consult Note    Signed   Date of Service: 10/07/2024 12:32 PM  Related encounter: ED to Hosp-Admission (Discharged) from 09/27/2024 in [place] [facility]   Signed     Expand All Collapse All           Physical Medicine and Rehabilitation Consult Reason for Consult:Rehab Referring Physician: Dr Trixie     HPI: Bryce Martinez is a 73 y.o. L handed male with hx of Advanced fibrosis due to Hep C, HTN, HLD, R RTC shoulder surgery, OA and GERD admitted after a routine colonoscopy where he aspirated/had emesis during said colonoscopy under anesthesia.  He very rapidly developed ARDS and required intubation from 11/24 to 12/1. Since extubation, he's still requiring Cortrak- since he's eating <25% of meals at most per pt and his wife.  He also was found to be COVID (+) on admission as well.    Pt also found to be in Acute sCHF secondary to Sepstic CMO and shock with EF as low as 25-30% with global hypokinesis.  Currently on RA.    Just passed FEES- now on regular diet   Having acut eon chronic back pain while in hospital- usually uses tylenol  and intermittent ESI's at home-    His WBC down to 17 from 32k and BUN 37 down from 53 His LFTs are elevated today to 55/72- last check was normal.    Also reports he's not sleeping well- wakes up regularly. SO really tired.       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          Review of Systems  Constitutional:  Positive for malaise/fatigue and weight loss.  HENT: Negative.    Eyes: Negative.   Respiratory:  Positive for cough and shortness of breath. Negative for sputum production.   Cardiovascular:  Negative for chest pain and leg swelling.  Gastrointestinal:  Positive for nausea. Negative for abdominal pain and constipation.       Lack of  appetite- has cortrak as a result  Genitourinary: Negative.   Musculoskeletal:  Positive for back pain and myalgias.  Skin: Negative.   Neurological:  Positive for focal weakness and weakness.  Endo/Heme/Allergies: Negative.   Psychiatric/Behavioral:  The patient has insomnia.   All other systems reviewed and are negative.      Past Medical History:  Diagnosis Date   Allergy     Arthritis     ED (erectile dysfunction)     GERD (gastroesophageal reflux disease)      Occasionally   Hepatitis C     Hyperlipidemia     Hypertension               Past Surgical History:  Procedure Laterality Date   APPENDECTOMY       CHOLECYSTECTOMY N/A 04/25/2014    Procedure: LAPAROSCOPIC CHOLECYSTECTOMY;  Surgeon: Oneil DELENA Budge, MD;  Location: [facility];  Service: General;  Laterality: N/A;   COLONOSCOPY N/A 09/01/2019    Procedure: COLONOSCOPY;  Surgeon: Golda Claudis PENNER, MD;  Location: [facility];  Service: Endoscopy;  Laterality: N/A;  930   COLONOSCOPY N/A 09/27/2024    Procedure: COLONOSCOPY;  Surgeon: Eartha Angelia Sieving, MD;  Location: [facility];  Service: Gastroenterology;  Laterality: N/A;  7:30am, ASA 1-2   HEMICOLECTOMY Right  lap hand assisted   POLYPECTOMY   09/01/2019    Procedure: POLYPECTOMY;  Surgeon: Golda Claudis PENNER, MD;  Location: [facility];  Service: Endoscopy;;  colon   right shoulder surgery Right 03/2005    rotator cuff repair             Family History  Problem Relation Age of Onset   Hypertension Mother     Cancer Father          colon    Hypertension Sister     Hypertension Brother     Diabetes Brother     Drug abuse Brother     Hypertension Daughter     Hypertension Son          Social History:  reports that he quit smoking about 52 years ago. His smoking use included cigarettes. He started smoking about 67 years ago. He has a 4.4 pack-year smoking history. He has been exposed to tobacco smoke. He has never used smokeless tobacco. He  reports that he does not currently use alcohol. He reports that he does not use drugs. Allergies:  Allergies       Allergies  Allergen Reactions   Amlodipine  Other (See Comments)      unknown            Medications Prior to Admission  Medication Sig Dispense Refill   spironolactone  (ALDACTONE ) 25 MG tablet TAKE 1 TABLET (25 MG TOTAL) BY MOUTH DAILY. 90 tablet 3   losartan  (COZAAR ) 100 MG tablet TAKE 1 TABLET BY MOUTH EVERY DAY 90 tablet 1   meclizine  (ANTIVERT ) 25 MG tablet Take 1 tablet (25 mg total) by mouth 2 (two) times daily as needed for dizziness. 30 tablet 0   Misc. Devices MISC Blood pressure cuff/device - 1. ICD10: I10 1 each 0          Home: Home Living Family/patient expects to be discharged to:: Private residence Living Arrangements: Spouse/significant other Available Help at Discharge: Family, Available 24 hours/day Type of Home: House Home Access: Stairs to enter [employer] of Steps: 3 Entrance Stairs-Rails: Right, Left Home Layout: Laundry or work area in basement, One level Bathroom Shower/Tub: Engineer, Manufacturing Systems: Standard Home Equipment: None  Functional History: Prior Function Prior Level of Function : Independent/Modified Independent, Working/employed, Driving Mobility Comments: Independent without an AD. ADLs Comments: Independent with ADLs and IADLs. Drives. 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 Functional Status:  Mobility: Bed Mobility Overal bed mobility: Needs Assistance Bed Mobility: Sit to Supine Supine to sit: Mod assist, HOB elevated, Used rails Sit to supine: Max assist, +2 for physical assistance, +2 for safety/equipment, HOB elevated, Used rails General bed mobility comments: assist to manage trunk and to elevate B LE into bed Transfers Overall transfer level: Needs assistance Equipment used: Rolling walker (2 wheels) Transfers: Sit to/from Stand, Bed to  chair/wheelchair/BSC Sit to Stand: Max assist, +2 physical assistance, +2 safety/equipment Bed to/from chair/wheelchair/BSC transfer type:: Squat pivot Squat pivot transfers: Max assist, +2 safety/equipment, +2 physical assistance General transfer comment: Pt requiring +2 assist to power up from sitting in the recliner; cues for technique and hand placement Ambulation/Gait General Gait Details: unable   ADL: ADL Overall ADL's : Needs assistance/impaired Eating/Feeding: Set up, Minimal assistance, Sitting Grooming: Minimal assistance, Moderate assistance, Sitting Upper Body Bathing: Moderate assistance, Maximal assistance, Sitting Lower Body Bathing: Total assistance, +2 for physical assistance, +2 for safety/equipment, Sit to/from stand Upper Body Dressing :  Moderate assistance, Sitting (with increased time) Lower Body Dressing: Total assistance, +2 for physical assistance, +2 for safety/equipment, Sit to/from stand Toilet Transfer: Maximal assistance, Squat-pivot, Rolling walker (2 wheels), BSC/3in1 Toilet Transfer Details (indicate cue type and reason): simulated from recliner to bed Toileting- Clothing Manipulation and Hygiene: Set up, Contact guard assist, Bed level, Total assistance, +2 for physical assistance, +2 for safety/equipment, Sit to/from stand Toileting - Clothing Manipulation Details (indicate cue type and reason): Set up to CGA for use of handheld urinal; Total assist +2 for pericare in stit/stand Functional mobility during ADLs:  (deferred this session) General ADL Comments: Pt with decreased activity tolerance, fatiguing quickly during tasks. Pt also requiring increased time for motor planning and with funcitonal level affected by generalized weakness.   Cognition: Cognition Orientation Level: Oriented to person, Oriented to place, Oriented to time, Oriented to situation Cognition Arousal: Alert Behavior During Therapy: Flat affect   Blood pressure 133/63, pulse (!)  58, temperature 98.1 F (36.7 C), resp. rate 18, height 6' 1 (1.854 m), weight 74 kg, SpO2 95%. Physical Exam Vitals and nursing note reviewed. Exam conducted with a chaperone present.  Constitutional:      Appearance: He is normal weight.     Comments: Pt appears ill- so tired; sitting up in bed; with wife at bedside; awake, but obviously sleepy- kept drifting off; and closing eyes; appropriate, NAD  HENT:     Head: Normocephalic and atraumatic.     Comments: Green dye on lips Lips dry Cortrak in place    Right Ear: External ear normal.     Left Ear: External ear normal.     Nose: Nose normal.     Comments: cortrak    Mouth/Throat:     Mouth: Mucous membranes are dry.     Pharynx: Oropharynx is clear.  Eyes:     General:        Right eye: No discharge.        Left eye: No discharge.     Extraocular Movements: Extraocular movements intact.  Cardiovascular:     Rate and Rhythm: Normal rate and regular rhythm.     Heart sounds: Normal heart sounds. No murmur heard.    No gallop.  Pulmonary:     Comments: Increased work of breathing when first walked in and after exam- decreased at bases slightly- coarse breath sounds /upper airway sounds Abdominal:     General: Bowel sounds are normal. There is no distension.     Palpations: Abdomen is soft.     Tenderness: There is no abdominal tenderness.  Musculoskeletal:     Cervical back: Neck supple.     Comments: RUE- deltoid 3+/5; biceps 3+ to 4-/5; Otherwise 4/5 throughout distally LUE- deltoid 2/5; Biceps 3+/5, otherwise 4/5 throughout distally RLE- HF 2- to 2/5; KE/KF 3+/5; and DF/PF 5-/5 LLE- same as RLE  Skin:    General: Skin is warm and dry.     Comments: B/L forearm IV's- look OK  Neurological:     Mental Status: He is alert.     Comments: Aox3 except very sleepy/tired Intact to light touch in all 4 extremities Absent DTRs in LE's and 1+ in Ue's B/L   Psychiatric:        Thought Content: Thought content normal.         Judgment: Judgment normal.     Comments: Tired/sleepy- but interactive       Lab Results Last 24 Hours  Results for orders placed or performed during the hospital encounter of 09/27/24 (from the past 24 hours)  Glucose, capillary     Status: Abnormal    Collection Time: 10/06/24  3:56 PM  Result Value Ref Range    Glucose-Capillary 194 (H) 70 - 99 mg/dL  Glucose, capillary     Status: Abnormal    Collection Time: 10/06/24  8:26 PM  Result Value Ref Range    Glucose-Capillary 141 (H) 70 - 99 mg/dL  Glucose, capillary     Status: Abnormal    Collection Time: 10/06/24 11:58 PM  Result Value Ref Range    Glucose-Capillary 146 (H) 70 - 99 mg/dL  Glucose, capillary     Status: Abnormal    Collection Time: 10/07/24  4:17 AM  Result Value Ref Range    Glucose-Capillary 154 (H) 70 - 99 mg/dL  Glucose, capillary     Status: None    Collection Time: 10/07/24  8:02 AM  Result Value Ref Range    Glucose-Capillary 98 70 - 99 mg/dL  Comprehensive metabolic panel with GFR     Status: Abnormal    Collection Time: 10/07/24  8:46 AM  Result Value Ref Range    Sodium 134 (L) 135 - 145 mmol/L    Potassium 5.2 (H) 3.5 - 5.1 mmol/L    Chloride 100 98 - 111 mmol/L    CO2 24 22 - 32 mmol/L    Glucose, Bld 104 (H) 70 - 99 mg/dL    BUN 37 (H) 8 - 23 mg/dL    Creatinine, Ser 9.13 0.61 - 1.24 mg/dL    Calcium  8.6 (L) 8.9 - 10.3 mg/dL    Total Protein 5.5 (L) 6.5 - 8.1 g/dL    Albumin 2.0 (L) 3.5 - 5.0 g/dL    AST 55 (H) 15 - 41 U/L    ALT 72 (H) 0 - 44 U/L    Alkaline Phosphatase 68 38 - 126 U/L    Total Bilirubin 1.0 0.0 - 1.2 mg/dL    GFR, Estimated >39 >39 mL/min    Anion gap 10 5 - 15  Magnesium      Status: None    Collection Time: 10/07/24  8:46 AM  Result Value Ref Range    Magnesium  2.0 1.7 - 2.4 mg/dL  CBC     Status: Abnormal    Collection Time: 10/07/24  8:46 AM  Result Value Ref Range    WBC 13.8 (H) 4.0 - 10.5 K/uL    RBC 4.67 4.22 - 5.81 MIL/uL    Hemoglobin 13.1 13.0 -  17.0 g/dL    HCT 60.1 60.9 - 47.9 %    MCV 85.2 80.0 - 100.0 fL    MCH 28.1 26.0 - 34.0 pg    MCHC 32.9 30.0 - 36.0 g/dL    RDW 85.6 88.4 - 84.4 %    Platelets 264 150 - 400 K/uL    nRBC 0.1 0.0 - 0.2 %  Glucose, capillary     Status: Abnormal    Collection Time: 10/07/24 11:57 AM  Result Value Ref Range    Glucose-Capillary 152 (H) 70 - 99 mg/dL      Imaging Results (Last 48 hours)  No results found.       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 Yes  Co-Morbidities requiring supervision/potential complications: Acute sCHF with EF 25-30%; HTN, HLD; Hep C advanced fibrosis; OA, GERD, chronic low back pain- intermittent ESI's; ARDS, COVID (+)- didn't get COVID vaccine this year Due to bladder management, bowel management, safety, skin/wound care, disease management, medication administration, pain management, and patient education, does the patient require 24 hr/day rehab nursing? Yes Does the patient require coordinated care of a physician, rehab nurse, therapy disciplines of PT, OT, might need SLP for vocal intensity to address physical and functional deficits in the context of the above medical diagnosis(es)? Yes Addressing deficits in the following areas: balance, endurance, locomotion, strength, transferring, bowel/bladder control, bathing, dressing, feeding, grooming, toileting, and vocal intensity Can the patient actively participate in an intensive therapy program of at least 3 hrs of therapy per day at least 5 days per week? Yes The potential for patient to make measurable gains while on inpatient rehab is good Anticipated functional outcomes upon discharge from inpatient rehab are min assist  with PT, min assist with OT, modified independent with SLP. Estimated rehab  length of stay to reach the above functional goals is: 2-3 weeks Anticipated discharge destination: Home Overall Rehab/Functional Prognosis: good   RECOMMENDATIONS: This patient's condition is appropriate for continued rehabilitative care in the following setting: CIR Patient has agreed to participate in recommended program. Yes Note that insurance prior authorization may be required for reimbursement for recommended care.   Comment:  Patient meets criteria and is a great candidate-  for CIR_ due to ICU myopathy and severe proximal weakness, which is obvious on MSK exam- causing him to be mod-max A of 2- will need a longer stay due to this dx, not debility-pt was at a very high level of function prior to admission- will need SLP for vocal intensity since voice so quiet.   Will d/w admissions coordinator Pt has low endurance and increased fatigue due to ICU myopathy but is maintaining his O2 sats on Room air.  Thank you for this consult.    Bryce Lovorn, MD 10/07/2024 '     I spent a total of  56  minutes on total care today- >50% coordination of care- due to  Review of chart and therapy notes ; d/w pt and wife about prior function, vs current function and exam; also documentation and d/w admissions coordinator.            Routing History

## 2024-10-13 NOTE — Progress Notes (Signed)
 Inpatient Rehabilitation  Patient information reviewed and entered into eRehab system by [facility]. Karen Kays., CCC/SLP, PPS Coordinator.  Information including medical coding, functional ability and quality indicators will be reviewed and updated through discharge.

## 2024-10-14 DIAGNOSIS — E440 Moderate protein-calorie malnutrition: Secondary | ICD-10-CM | POA: Insufficient documentation

## 2024-10-14 LAB — CBC
HCT: 34.5 % — ABNORMAL LOW (ref 39.0–52.0)
Hemoglobin: 11.7 g/dL — ABNORMAL LOW (ref 13.0–17.0)
MCH: 29.2 pg (ref 26.0–34.0)
MCHC: 33.9 g/dL (ref 30.0–36.0)
MCV: 86 fL (ref 80.0–100.0)
Platelets: 359 K/uL (ref 150–400)
RBC: 4.01 MIL/uL — ABNORMAL LOW (ref 4.22–5.81)
RDW: 14.6 % (ref 11.5–15.5)
WBC: 9.1 K/uL (ref 4.0–10.5)
nRBC: 0 % (ref 0.0–0.2)

## 2024-10-14 LAB — GLUCOSE, CAPILLARY
Glucose-Capillary: 95 mg/dL (ref 70–99)
Glucose-Capillary: 139 mg/dL — ABNORMAL HIGH (ref 70–99)
Glucose-Capillary: 99 mg/dL (ref 70–99)
Glucose-Capillary: 112 mg/dL — ABNORMAL HIGH (ref 70–99)

## 2024-10-14 LAB — BASIC METABOLIC PANEL WITH GFR
Anion gap: 6 (ref 5–15)
BUN: 19 mg/dL (ref 8–23)
CO2: 22 mmol/L (ref 22–32)
Calcium: 8.1 mg/dL — ABNORMAL LOW (ref 8.9–10.3)
Chloride: 100 mmol/L (ref 98–111)
Creatinine, Ser: 0.98 mg/dL (ref 0.61–1.24)
GFR, Estimated: >60 mL/min (ref >60)
Glucose, Bld: 93 mg/dL (ref 70–99)
Potassium: 4.2 mmol/L (ref 3.5–5.1)
Sodium: 128 mmol/L — ABNORMAL LOW (ref 135–145)

## 2024-10-14 MED ORDER — SALINE SPRAY 0.65 % NA SOLN
1.0000 | NASAL | Status: DC | PRN
  Administered 2024-10-14: 1 via NASAL
  Filled 2024-10-14: qty 44

## 2024-10-14 MED ORDER — CEPHALEXIN 250 MG PO CAPS
500.0000 mg | ORAL_CAPSULE | Freq: Three times a day (TID) | ORAL | Status: DC
  Administered 2024-10-14 – 2024-10-17 (×11): 500 mg via ORAL
  Filled 2024-10-14 (×11): qty 2

## 2024-10-14 NOTE — Care Management (Signed)
 Inpatient Rehabilitation Center Individual Statement of Services  Patient Name:  Bryce Martinez  Date:  10/14/2024  Welcome to the Inpatient Rehabilitation Center.  Our goal is to provide you with an individualized program based on your diagnosis and situation, designed to meet your specific needs.  With this comprehensive rehabilitation program, you will be expected to participate in at least 3 hours of rehabilitation therapies Monday-Friday, with modified therapy programming on the weekends.  Your rehabilitation program will include the following services:  Physical Therapy (PT), Occupational Therapy (OT), 24 hour per day rehabilitation nursing, Therapeutic Recreaction (TR), Psychology, Neuropsychology, Care Coordinator, Rehabilitation Medicine, Nutrition Services, Pharmacy Services, and Other  Weekly team conferences will be held on Tuesday to discuss your progress.  Your Inpatient Rehabilitation Care Coordinator will talk with you frequently to get your input and to update you on team discussions.  Team conferences with you and your family in attendance may also be held.  Expected length of stay: 5-7 days    Overall anticipated outcome: Independent with Assistive Device  Depending on your progress and recovery, your program may change. Your Inpatient Rehabilitation Care Coordinator will coordinate services and will keep you informed of any changes. Your Inpatient Rehabilitation Care Coordinator's name and contact numbers are listed  below.  The following services may also be recommended but are not provided by the Inpatient Rehabilitation Center:  Driving Evaluations Home Health Rehabiltiation Services Outpatient Rehabilitation Services Vocational Rehabilitation   Arrangements will be made to provide these services after discharge if needed.  Arrangements include referral to agencies that provide these services.  Your insurance has been verified to be:  Midwife primary doctor is:  [facility]  Pertinent information will be shared with your doctor and your insurance company.  Inpatient Rehabilitation Care Coordinator:  Graeme Jude, KEN [phone] or ([phone]  Information discussed with and copy given to patient by: Graeme DELENA Jude, 10/14/2024, 8:58 AM

## 2024-10-14 NOTE — Progress Notes (Addendum)
 PROGRESS NOTE   Subjective/Complaints:  Pt has what feels like  boil on L lower buttock, near crease.  Also c/o being so cold, but no chills- never fever per nursing Is EXHAUSTED- wondering when will get better.  Per wife, dropped BP's to 70s/80's overnight when in bed- was laying down.  Cannot find any documentation of this- BP running 110s to 130s in computer- checked therapy notes as well and wasn't in there?  Wife said drinking 3-4 cups water /day- advised 6-8 cups/day.  LBM yesterday- pt flushed it. Doesn't feel constipated anymore  ROS:   Pt denies SOB, abd pain, CP, N/V/C/D, and vision changes  Per HPI  Objective:   DG Abd 1 View Result Date: 10/12/2024 EXAM: 1 VIEW XRAY OF THE ABDOMEN 10/12/2024 06:29:00 PM COMPARISON: 10/02/2024 CLINICAL HISTORY: Abdominal bloating. FINDINGS: BOWEL: Continued diffuse gaseous distention of the bowel suggests it is ileus. This is similar to the prior study. SOFT TISSUES: Prior cholecystectomy. No abnormal calcifications. BONES: No acute fracture. IMPRESSION: 1. Continued diffuse gaseous distention of the bowel, similar to the prior study, suggests ileus. Electronically signed by: Franky Crease MD 10/12/2024 07:34 PM EST RP Workstation: [id number]   Recent Labs    10/13/24 0510 10/14/24 0608  WBC 8.3 9.1  HGB 12.0* 11.7*  HCT 35.9* 34.5*  PLT 404* 359   Recent Labs    10/13/24 0510 10/14/24 0608  NA 132* 128*  K 4.5 4.2  CL 101 100  CO2 23 22  GLUCOSE 92 93  BUN 24* 19  CREATININE 1.13 0.98  CALCIUM  8.5* 8.1*    Intake/Output Summary (Last 24 hours) at 10/14/2024 1019 Last data filed at 10/13/2024 1800 Gross per 24 hour  Intake 236 ml  Output --  Net 236 ml        Physical Exam: Vital Signs Blood pressure 130/66, pulse 73, temperature 98.3 F (36.8 C), resp. rate 18, height 6' 1 (1.854 m), weight 74.8 kg, SpO2 97%.    General: awake, alert, appropriate,  appears very tired; in shower sitting with therapy at side; NAD HENT: conjugate gaze; oropharynx moist CV: regular rate and rhythm; no JVD Pulmonary: CTA B/L; no W/R/R- good air movement GI: soft, NT, ND, (+)BS Psychiatric: appropriate- so tired appearing Neurological: Ox3 Skin: can feel what appears to be boil near crease on L lower buttock- no skin changes externally  Musculoskeletal:     Cervical back: Neck supple.     Comments: Deltoids 2/5; Biceps/triceps 3+/5; WE/grip and FA 5-/5 B/L LE's- HF 3-/5; KE/KF 4/5 and DF/PF 5-/5 B/L  Skin:    General: Skin is warm and dry.     Comments: No skin breakdown on skin that's easily seen  Neurological:     Mental Status: He is oriented to person, place, and time.     Comments: Ox3 Intact to light touch in all 4 extremities Decreased DTR's throughout   Assessment/Plan: 1. Functional deficits which require 3+ hours per day of interdisciplinary therapy in a comprehensive inpatient rehab setting. Physiatrist is providing close team supervision and 24 hour management of active medical problems listed below. Physiatrist and rehab team continue to assess barriers to discharge/monitor patient progress toward  functional and medical goals  Care Tool:  Bathing    Body parts bathed by patient: Right arm, Left arm, Chest, Abdomen, Front perineal area, Buttocks, Right upper leg, Left upper leg, Right lower leg, Left lower leg, Face         Bathing assist Assist Level: Supervision/Verbal cueing     Upper Body Dressing/Undressing Upper body dressing   What is the patient wearing?: Pull over shirt    Upper body assist Assist Level: Independent    Lower Body Dressing/Undressing Lower body dressing      What is the patient wearing?: Underwear/pull up, Pants     Lower body assist Assist for lower body dressing: Supervision/Verbal cueing     Toileting Toileting    Toileting assist Assist for toileting: Supervision/Verbal cueing      Transfers Chair/bed transfer  Transfers assist     Chair/bed transfer assist level: Contact Guard/Touching assist     Locomotion Ambulation   Ambulation assist      Assist level: Contact Guard/Touching assist Assistive device: No Device Max distance: 86   Walk 10 feet activity   Assist     Assist level: Contact Guard/Touching assist Assistive device: No Device   Walk 50 feet activity   Assist    Assist level: Contact Guard/Touching assist Assistive device: No Device    Walk 150 feet activity   Assist Walk 150 feet activity did not occur: Safety/medical concerns (fatigue/SOB)         Walk 10 feet on uneven surface  activity   Assist     Assist level: Minimal Assistance - Patient > 75% Assistive device:  (HHA)   Wheelchair     Assist Is the patient using a wheelchair?: Yes Type of Wheelchair: Manual    Wheelchair assist level: Supervision/Verbal cueing Max wheelchair distance: 150    Wheelchair 50 feet with 2 turns activity    Assist        Assist Level: Supervision/Verbal cueing   Wheelchair 150 feet activity     Assist      Assist Level: Supervision/Verbal cueing   Blood pressure 130/66, pulse 73, temperature 98.3 F (36.8 C), resp. rate 18, height 6' 1 (1.854 m), weight 74.8 kg, SpO2 97%.  Medical Problem List and Plan: 1. 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  Antithrombotics: -DVT/anticoagulation:  Mechanical: Sequential compression devices, below knee Bilateral lower extremities Pharmaceutical: Heparin   12/10- changed to Lovenox - not allergic and less shots             -antiplatelet therapy: N/A   3. Pain Management: Lidoderm  patch daily. Tylenol  prn    12/10- denies pain- 4. Mood/Behavior/Sleep: LCSW to follow  for evaluation and support when available.              -antipsychotic agents: N/A             -anxiety: Atarax  10 mg prn              -sleep: Melatonin 3 mg    5. Neuropsych/cognition: This patient is capable of making decisions on his own behalf.   6. Skin/Wound Care: Routine pressure  relief measures.    7. Fluids/Electrolytes/Nutrition: Monitor strict I&O and daily weights.  Follow up labs CBC/CMP. RD consulted for ongoing care. Patient found to be highly intolerant of lactose.              -Dys 3 with Mallie Pinion vs Ensure supplement              -Vitamin C  and Zinc     12/11- pt now on regular diet 8. Acute Hypoxic resp failure due to ARDS: From aspiration PNA. Extubated on 12/1 and has been 98-100% on room air.  -Continue Ins spiro.    9. Acute Systolic CHF: LVEF 25 to 30%, global hypokinesis due to ARDS and cardiogenic shock. Repeat echo recommended in 3 to 4 weeks outpatient. Continue Losartan  100 mg daily.    12/11- probably the reason pt so exhausted is cardiac- d/w pt-  10. HTN: Monitor BP per protocol. Continue Losartan .    112/11- pt's BP looks great in computer- per wife was 70/s80's lastnight, but see no record, nor dropping in therapy- will monitor for low BP 11. Covid +: felt to be related to older infection, PCR still positive. Asymptomatic, does not require precautions.              -Robitussin 10 ml BID    12. AKIAzotemia; Cr stable at 0.90.    12/10- Pt's BUN down to 24 form 37- max 50's- so doing better 13. Hyperkalemia: Required Lokelma  x1, resolved.  Monitor Labs.    12/10- K+ 4.5  12/11- K+ 4.2- Cr 0.98 and BUN 19- down from 25 14. Hx of Hep C s/p treatment, advanced fibrosis: 12/5  AST/ALT 44/72. Monitor with labs.  F/u outpatient with GI.    15. T2DM? Wife and pt say never been dx'd with DM: Hgb A1c 5.5 09/2017, glucose has been stable requiring minimal insulin .              -monitor BS ac/hs and use SSI, could d/c at some point.    16. Abdominal  bloating/Ileus: KUB ordered, continue Senna S. LBM character not documented.    12/10- Pt had 1 BM yesterday late AM and at 3am- large- per KUB, had ileus yesterday evening- will  recheck KUB in AM and see since is going more regularly now.   12/11- Having daily BM's- doing better clinically 17. Hyponatremia  12/11- Na down to 128- will recheck in AM and if lower, will water  restrict, and check labs 18. Skin boil- large  12/11- will start Keflex 500 mg q8 hours for 5 days to treat boil- so doesn't open up/cause skin breakdown.    I spent a total of 51   minutes on total care today- >50% coordination of care- due to  Reviewed Chart in depth looking for BP issues, also d/w OT and pt and wife; also d/w team about d/c date; and made meds changes as above and reviewed labs and vitals, B/B  LOS: 2 days A FACE TO FACE EVALUATION WAS PERFORMED  Kinley Ferrentino 10/14/2024, 10:19 AM

## 2024-10-14 NOTE — Progress Notes (Signed)
 Physical Therapy Session Note  Patient Details  Name: Bryce Martinez MRN: [id number] Date of Birth: August 05, 1951  Today's Date: 10/14/2024 PT Individual Time: 1435-1530 PT Individual Time Calculation (min): 55 min   Short Term Goals: Week 1:  PT Short Term Goal 1 (Week 1): STG=LTG 2/2 ELOS  Skilled Therapeutic Interventions/Progress Updates: Pt presented in bed with wife present agreeable to therapy. Pt denies pain at start of session, does endorse some irritation inside nose from where Coretrack was placed. Requested from nsg if pt could get saline spray for comfort. Pt completed bed mobility with supervision and HOB elevated. Pt donned shoes with set up. With rollator stood with supervision and ambulated to main gym at self pace without rest break. Pt then participated in general activities for conditioning. Pt participated in use of rebounder with basketball forward and with lateral turns X 10 each. Pt also participated in x 3 rounds of horseshoes in standing without AD however pt required significant rest breaks between bouts. Throughout session discussed energy conservation with pt indicating 5/10 energy level at start of session with decrease to 3/10 after horseshoes. Pt required significantly increased time prior to ambulating to ortho gym. At nsg station pt expressed significant fatigue with noted increase of DOE. Pt took seated rest break at nsg station and then ambulated back to room vs ortho gym. Completed bed mobility with supervision in room and pt able to reposition to comfort. Pt left in bed at end of session with call bell within reach and needs met.      Therapy Documentation Precautions:  Precautions Precautions: Fall Recall of Precautions/Restrictions: Intact Precaution/Restrictions Comments: orthostatic hypotension Restrictions Weight Bearing Restrictions Per Provider Order: No General:   Vital Signs: Therapy Vitals Temp: 98 F (36.7 C) Temp Source: Oral Pulse  Rate: 84 Resp: 18 BP: (!) 120/59 Patient Position (if appropriate): Lying Oxygen Therapy SpO2: 98 % O2 Device: Room Air    Therapy/Group: Individual Therapy  Damin Salido 10/14/2024, 3:36 PM

## 2024-10-14 NOTE — Plan of Care (Signed)
 Per interdisciplinary team discussion SCI team, patient has met all goals: independent with assistive device DME TTB, education is complete, and no further barriers identified.   Patient is ready for discharge on 10/08/2024.

## 2024-10-14 NOTE — Progress Notes (Signed)
 Met with patient to review current situation, team conference and plan of care. Reviewed medications, safety, bowel, bladder. Continue to follow along to provide educational needs to facilitate preparation for discharge.

## 2024-10-14 NOTE — Progress Notes (Addendum)
 Patient ID: Bryce Martinez, male   DOB: [birth year], 73 y.o.   MRN: [id number]   SW confimed with medical team pt will d/c on Monday.   Sw spoke with [facility] SW with [facility] who reported that ptis 10% service connected so he is eligible for [facility], medications (at [facility] only).   SW sent orders for Outpatient PT and DME- rollator and tub transfer bench.  SW met with pt and pt wife in room to discuss above.   Graeme Jude, MSW, LCSW Office: [phone] Cell: [phone] Fax: [phone]

## 2024-10-14 NOTE — Progress Notes (Signed)
 Physical Therapy Session Note  Patient Details  Name: Bryce Martinez MRN: [id number] Date of Birth: April 27, 1951  Today's Date: 10/14/2024 PT Individual Time: 0900-1015 PT Individual Time Calculation (min): 75 min   Short Term Goals: Week 1:  PT Short Term Goal 1 (Week 1): STG=LTG 2/2 ELOS  Skilled Therapeutic Interventions/Progress Updates:    pt received in bed and agreeable to therapy. No complaint of pain. Pt extremely fatigued after OT session, but agreeable to low impact therapy session with encouragement.  Bed mobility and donning shoes with supervision. ambulatory transfer to w/c with supervision.   Pt propelled w/c with BUE >200 ft for endurance and functional mobility. Pt ambulated  2 x 80-100 with no AD. Discussed energy conservation concepts and s/s of fatigue, pt expressed understanding. Pt trialled rollator and was able to ambulate ~200 ft before needing rest break. Educated pt on rollator safety and locking brakes/placing against solid object before sitting, pt able to perform with min cueing. Pt and wife agreeable to rollator for community distances in case of fatigue.  Pt propelled w/c with BLE for strengthening and endurance, required rest breaks after ~50 ft consistently d/t fatigue. Pt returned to room and to bed with ambulatory transfer. Pt sat EOB while interacting with therapy dog before returning to supine with supervision. Pt performed 4 x 8 bridges for glute strength and endurance. Pt was left with all needs in reach and alarm active.     Therapy Documentation Precautions:  Precautions Precautions: Fall Recall of Precautions/Restrictions: Intact Precaution/Restrictions Comments: orthostatic hypotension Restrictions Weight Bearing Restrictions Per Provider Order: No General:       Therapy/Group: Individual Therapy  Schuyler JAYSON Batter 10/14/2024, 10:15 AM

## 2024-10-14 NOTE — Progress Notes (Signed)
 Occupational Therapy Session Note  Patient Details  Name: Bryce Martinez MRN: [id number] Date of Birth: 01-26-1951  Today's Date: 10/14/2024 OT Individual Time: 0700-0810 OT Individual Time Calculation (min): 70 min    Short Term Goals: Week 1:  OT Short Term Goal 1 (Week 1): STG=LTG mod I  Skilled Therapeutic Interventions/Progress Updates:    Pt resting in bed upon arrival. Pt reports he feels exhausted. Educated pt on rehab process and energy conservation strategies. Pt agreeable to getting OOB for shower and dressing. Pt completed all bathing/dressing and toileting tasks with supervision. Pt fatigues quickly and requires multiple rest breaks. Reinforced energy conservation strategies. Discussed importance of scheduling and planning when at home to allow time for rest breaks when leaving home for appointments, etc. Pt verbalized understanding of all recommendations. Pt remained in bed with all needs within reach and bed alarm activated.    Therapy Documentation Precautions:  Precautions Precautions: Fall Recall of Precautions/Restrictions: Intact Precaution/Restrictions Comments: orthostatic hypotension Restrictions Weight Bearing Restrictions Per Provider Order: No Pain:  Pt denies pain this morning   Therapy/Group: Individual Therapy  Maritza Debby Mare 10/14/2024, 8:16 AM

## 2024-10-15 DIAGNOSIS — F4323 Adjustment disorder with mixed anxiety and depressed mood: Secondary | ICD-10-CM | POA: Diagnosis not present

## 2024-10-15 LAB — BASIC METABOLIC PANEL WITH GFR
Anion gap: 4 — ABNORMAL LOW (ref 5–15)
BUN: 17 mg/dL (ref 8–23)
CO2: 22 mmol/L (ref 22–32)
Calcium: 8.2 mg/dL — ABNORMAL LOW (ref 8.9–10.3)
Chloride: 100 mmol/L (ref 98–111)
Creatinine, Ser: 1.05 mg/dL (ref 0.61–1.24)
GFR, Estimated: >60 mL/min (ref >60)
Glucose, Bld: 92 mg/dL (ref 70–99)
Potassium: 4 mmol/L (ref 3.5–5.1)
Sodium: 126 mmol/L — ABNORMAL LOW (ref 135–145)

## 2024-10-15 LAB — GLUCOSE, CAPILLARY: Glucose-Capillary: 107 mg/dL — ABNORMAL HIGH (ref 70–99)

## 2024-10-15 MED ORDER — SORBITOL 70 % SOLN
30.0000 mL | Freq: Once | Status: AC
  Administered 2024-10-15: 30 mL via ORAL
  Filled 2024-10-15: qty 30

## 2024-10-15 MED ORDER — SODIUM CHLORIDE 1 G PO TABS
1.0000 g | ORAL_TABLET | Freq: Two times a day (BID) | ORAL | Status: DC
  Administered 2024-10-15 – 2024-10-18 (×7): 1 g via ORAL
  Filled 2024-10-15 (×7): qty 1

## 2024-10-15 MED ADMIN — Pantoprazole Sodium EC Tab 40 MG (Base Equiv): 40 mg | ORAL | NDC 65862056099

## 2024-10-15 MED FILL — Pantoprazole Sodium EC Tab 40 MG (Base Equiv): 40.0000 mg | ORAL | Qty: 1 | Status: AC

## 2024-10-15 NOTE — Progress Notes (Signed)
 PROGRESS NOTE   Subjective/Complaints:  Pt reports sleeps well 1/2 the night, then woken up by severe indigestion that feels like burning, stabbing- Tums helps but doesn't last.  Keeps up 2-4 hours after it starts nightly.   LBM 2 days ago- feels somewhat constipated- and bloated.   Boil feels much better- less palpable  ROS:   Pt denies SOB, (+) indigestion/abd pain, CP, N/V/ (+)C/D, and vision changes   Per HPI  Objective:   No results found.  Recent Labs    10/13/24 0510 10/14/24 0608  WBC 8.3 9.1  HGB 12.0* 11.7*  HCT 35.9* 34.5*  PLT 404* 359   Recent Labs    10/14/24 0608 10/15/24 0548  NA 128* 126*  K 4.2 4.0  CL 100 100  CO2 22 22  GLUCOSE 93 92  BUN 19 17  CREATININE 0.98 1.05  CALCIUM  8.1* 8.2*    Intake/Output Summary (Last 24 hours) at 10/15/2024 9191 Last data filed at 10/14/2024 1839 Gross per 24 hour  Intake 240 ml  Output --  Net 240 ml        Physical Exam: Vital Signs Blood pressure 121/64, pulse 67, temperature 98.4 F (36.9 C), temperature source Oral, resp. rate 16, height 6' 1 (1.854 m), weight 72.2 kg, SpO2 98%.     General: awake, alert, appropriate, sitting up eating at EOB, wife in room; still so fatigued; NAD HENT: conjugate gaze; oropharynx moist CV: regular rate and rhythm; no JVD Pulmonary: CTA B/L; no W/R/R- good air movement GI: soft, NT, mildly distended; hypoactive BS in spite of eating Psychiatric: appropriate but c/o indigestion/fatigue Neurological: Ox3  Skin: can feel what appears to be boil near crease on L lower buttock- no skin changes externally - slightly less palpable today Musculoskeletal:     Cervical back: Neck supple.     Comments: Deltoids 2/5; Biceps/triceps 3+/5; WE/grip and FA 5-/5 B/L LE's- HF 3-/5; KE/KF 4/5 and DF/PF 5-/5 B/L  Skin:    General: Skin is warm and dry.     Comments: No skin breakdown on skin that's easily seen   Neurological:     Mental Status: He is oriented to person, place, and time.     Comments: Ox3 Intact to light touch in all 4 extremities Decreased DTR's throughout   Assessment/Plan: 1. Functional deficits which require 3+ hours per day of interdisciplinary therapy in a comprehensive inpatient rehab setting. Physiatrist is providing close team supervision and 24 hour management of active medical problems listed below. Physiatrist and rehab team continue to assess barriers to discharge/monitor patient progress toward functional and medical goals  Care Tool:  Bathing    Body parts bathed by patient: Right arm, Left arm, Chest, Abdomen, Front perineal area, Buttocks, Right upper leg, Left upper leg, Right lower leg, Left lower leg, Face         Bathing assist Assist Level: Supervision/Verbal cueing     Upper Body Dressing/Undressing Upper body dressing   What is the patient wearing?: Pull over shirt    Upper body assist Assist Level: Independent    Lower Body Dressing/Undressing Lower body dressing      What is the patient  wearing?: Underwear/pull up, Pants     Lower body assist Assist for lower body dressing: Supervision/Verbal cueing     Toileting Toileting    Toileting assist Assist for toileting: Supervision/Verbal cueing     Transfers Chair/bed transfer  Transfers assist     Chair/bed transfer assist level: Contact Guard/Touching assist     Locomotion Ambulation   Ambulation assist      Assist level: Contact Guard/Touching assist Assistive device: No Device Max distance: 86   Walk 10 feet activity   Assist     Assist level: Contact Guard/Touching assist Assistive device: No Device   Walk 50 feet activity   Assist    Assist level: Contact Guard/Touching assist Assistive device: No Device    Walk 150 feet activity   Assist Walk 150 feet activity did not occur: Safety/medical concerns (fatigue/SOB)         Walk 10 feet on  uneven surface  activity   Assist     Assist level: Minimal Assistance - Patient > 75% Assistive device:  (HHA)   Wheelchair     Assist Is the patient using a wheelchair?: Yes Type of Wheelchair: Manual    Wheelchair assist level: Supervision/Verbal cueing Max wheelchair distance: 150    Wheelchair 50 feet with 2 turns activity    Assist        Assist Level: Supervision/Verbal cueing   Wheelchair 150 feet activity     Assist      Assist Level: Supervision/Verbal cueing   Blood pressure 121/64, pulse 67, temperature 98.4 F (36.9 C), temperature source Oral, resp. rate 16, height 6' 1 (1.854 m), weight 72.2 kg, SpO2 98%.  Medical Problem List and Plan: 1. 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  Antithrombotics: -DVT/anticoagulation:  Mechanical: Sequential compression devices, below knee Bilateral lower extremities Pharmaceutical: Heparin   12/10- changed to Lovenox - not allergic and less shots             -antiplatelet therapy: N/A   3. Pain Management: Lidoderm  patch daily. Tylenol  prn    12/10- denies pain- 4. Mood/Behavior/Sleep: LCSW to follow for evaluation and support when available.              -antipsychotic agents: N/A             -anxiety: Atarax  10 mg prn              -sleep: Melatonin 3 mg    12/12- changed to Trazodone - still not sleeping great due to Indigestions, so won't increase trazodone  yet 5. Neuropsych/cognition: This patient is capable of making decisions on his own behalf.   6. Skin/Wound Care: Routine pressure relief measures.    7. Fluids/Electrolytes/Nutrition: Monitor strict I&O and daily weights.  Follow up labs CBC/CMP. RD consulted for ongoing care. Patient found to be highly intolerant of lactose.              -Dys 3 with  Mallie Pinion vs Ensure supplement              -Vitamin C  and Zinc     12/11- pt now on regular diet 8. Acute Hypoxic resp failure  due to ARDS: From aspiration PNA. Extubated on 12/1 and has been 98-100% on room air.  -Continue Ins spiro.    9. Acute Systolic CHF: LVEF 25 to 30%, global hypokinesis due to ARDS and cardiogenic shock. Repeat echo recommended in 3 to 4 weeks outpatient. Continue Losartan  100 mg daily.    12/12- probably the reason pt so exhausted is cardiac- d/w pt- reiterated with pt/wife 10. HTN: Monitor BP per protocol. Continue Losartan .    112/11- pt's BP looks great in computer- per wife was 70/s80's lastnight, but see no record, nor dropping in therapy- will monitor for low BP 11. Covid +: felt to be related to older infection, PCR still positive. Asymptomatic, does not require precautions.              -Robitussin 10 ml BID    12. AKIAzotemia; Cr stable at 0.90.    12/10- Pt's BUN down to 24 form 37- max 50's- so doing better  12/12- BUN down to 17 and Cr 1.05 13. Hyperkalemia: Required Lokelma  x1, resolved.  Monitor Labs.    12/10- K+ 4.5  12/11- K+ 4.2- Cr 0.98 and BUN 19- down from 25 14. Hx of Hep C s/p treatment, advanced fibrosis: 12/5  AST/ALT 44/72. Monitor with labs.  F/u outpatient with GI.    15. T2DM? Wife and pt say never been dx'd with DM: Hgb A1c 5.5 09/2017, glucose has been stable requiring minimal insulin .              -monitor BS ac/hs and use SSI, could d/c at some point.   12/12- d/c;d BG checks since not diabetic per family and well controlled 16. Abdominal bloating/Ileus: KUB ordered, continue Senna S. LBM character not documented.    12/10- Pt had 1 BM yesterday late AM and at 3am- large- per KUB, had ileus yesterday evening- will  recheck KUB in AM and see since is going more regularly now.   12/12- LBM 2 days ago- feeling bloated again- will give Sorbitol- if doesn't improve, will need another KUB 17. Hyponatremia  12/11- Na down to 128- will  recheck in AM and if lower, will water  restrict, and check labs  12/12- Na down to 126- will put on fluid restriction and add NaCl tabs 1 gram BID- and recheck in AM 18. Skin boil- large  12/11- will start Keflex 500 mg q8 hours for 5 days to treat boil- so doesn't open up/cause skin breakdown.   12/12- is somewhat better   I spent a total of 53   minutes on total care today- >50% coordination of care- due to  Reviewed chart, did IPOC- reviewed labs and made multiple changes with Na 126- also dealing with ileus- and d/c'd Insulin  and CBG's- also d/w nursing x2  LOS: 3 days A FACE TO FACE EVALUATION WAS PERFORMED  Vantasia Pinkney 10/15/2024, 8:08 AM

## 2024-10-15 NOTE — Progress Notes (Signed)
 Physical Therapy Session Note  Patient Details  Name: Bryce Martinez MRN: [id number] Date of Birth: 01-15-1951  Today's Date: 10/15/2024 PT Individual Time: 0800-0845 PT Individual Time Calculation (min): 45 min   Short Term Goals: Week 1:  PT Short Term Goal 1 (Week 1): STG=LTG 2/2 ELOS  Skilled Therapeutic Interventions/Progress Updates: Pt presented in bed with wife and nsg present agreeable to therapy. Pt c/o indigestion overnight that kept pt up. Pt completed supine to sit with supervision and HOB slightly elevated, After taking am meds pt donned shoes with set up and stood without AD and distant supervision. Pt ambulated to ortho gym with rollator and supervision. Pt participated in NuStep activity for general conditioning. Pt participated in 3 x 2 min bouts at level 1 with 3 min rest breaks between bouts. Pt indicated 4-5/10 on mBORG with each bout. Continued education re: energy conservation. Once completed pt ambulated hallway to end then back to pt's room ~257ft without rest beak and use of rollator. Once back in room pt returned to bedside, doffed shoes mod I and returned to supine. Pt able to reposition self in bed at end of session and left with call bell within reach, wife present, and current needs met.       Therapy Documentation Precautions:  Precautions Precautions: Fall Recall of Precautions/Restrictions: Intact Precaution/Restrictions Comments: orthostatic hypotension Restrictions Weight Bearing Restrictions Per Provider Order: No   Therapy/Group: Individual Therapy  Omaree Fuqua 10/15/2024, 12:45 PM

## 2024-10-15 NOTE — Progress Notes (Signed)
 Occupational Therapy Session Note  Patient Details  Name: Bryce Martinez MRN: [id number] Date of Birth: 1950/11/28  Today's Date: 10/15/2024 OT Individual Time: 1300-1400 OT Individual Time Calculation (min): 60 min    Short Term Goals: Week 1:  OT Short Term Goal 1 (Week 1): STG=LTG mod I  Skilled Therapeutic Interventions/Progress Updates:    Patient agreeable to participate in OT session. Reports 0/10 pain level.   Patient participated in skilled OT session focusing on functional mobility, showering, dressing, energy conservation education. Patient completed functional mobility into bathroom completing clothing doffing CGA TO sup. Patient then completed bathing SUP To mod I in shower. Following shower patient CGA to dry off. Patient required several breaks during session due to fair to poor activity tolerance. Patient then able to complete LB dressing sitting on shower chair with CGA to SU. UB required SU assist, LB verbal cues and SUP to CGA. Patient then able to return to bed with no AE functional mobility into room 8 ft. Patient educated with spouse on energy conservation techniques due to observed behaviors of patient pushing past fatigue with OT cues on taking breaks. Educated on pursed lip breathing for recovery. Patient left in bed with spouse present in room, needs in reach.   Therapy Documentation Precautions:  Precautions Precautions: Fall Recall of Precautions/Restrictions: Intact Precaution/Restrictions Comments: orthostatic hypotension Restrictions Weight Bearing Restrictions Per Provider Order: No  Therapy/Group: Individual Therapy  D'mariea L Jenay Morici 10/15/2024, 4:04 PM

## 2024-10-15 NOTE — Discharge Summary (Signed)
 Physician Discharge Summary  Patient ID: Bryce Martinez MRN: [id number] DOB/AGE: January 21, 1951 73 y.o.  Admit date: 10/12/2024 Discharge date: 10/18/2024  Discharge Diagnoses:  Principal Problem:   Critical illness myopathy Active Problems:   Dyslipidemia   Essential hypertension   History of hepatitis C   Hepatic fibrosis   Tubular adenoma of colon   ARDS (adult respiratory distress syndrome) (HCC)   Acute hypoxic respiratory failure (HCC)   Malnutrition of moderate degree   Adjustment disorder with mixed anxiety and depressed mood   Discharged Condition: stable  Significant Diagnostic Studies: DG Chest 2 View Result Date: 10/17/2024 CLINICAL DATA:  Cough. EXAM: CHEST - 2 VIEW COMPARISON:  10/04/2024 FINDINGS: New diffuse left lung airspace disease is seen mainly involving the lower lobe, suspicious for pneumonia. No pleural effusion. Heart size remains normal. IMPRESSION: New left lung airspace disease mainly involving the left lower lobe, suspicious for pneumonia. Electronically Signed   By: Norleen DELENA Kil M.D.   On: 10/17/2024 15:45   DG Abd 1 View Result Date: 10/12/2024 EXAM: 1 VIEW XRAY OF THE ABDOMEN 10/12/2024 06:29:00 PM COMPARISON: 10/02/2024 CLINICAL HISTORY: Abdominal bloating. FINDINGS: BOWEL: Continued diffuse gaseous distention of the bowel suggests it is ileus. This is similar to the prior study. SOFT TISSUES: Prior cholecystectomy. No abnormal calcifications. BONES: No acute fracture. IMPRESSION: 1. Continued diffuse gaseous distention of the bowel, similar to the prior study, suggests ileus. Electronically signed by: Franky Crease MD 10/12/2024 07:34 PM EST RP Workstation: [id number]   DG CHEST PORT 1 VIEW Result Date: 10/04/2024 CLINICAL DATA:  [id number] SOB (shortness of breath) [id number] EXAM: PORTABLE CHEST - 1 VIEW COMPARISON:  October 02, 2024 FINDINGS: Low lung volumes. Patchy opacities in both lung bases. No pleural effusion or pneumothorax. No cardiomegaly. Aortic  atherosclerosis. No acute fracture or destructive lesions. Multilevel thoracic osteophytosis. Right IJ approach central venous catheter terminates at the cavoatrial junction. Endotracheal tube is well-positioned in the mid trachea. Weighted feeding tube courses below the diaphragm with the distal tip not included in the field of view. IMPRESSION: 1. Similar patchy airspace opacities in both lung bases. No pleural effusion or pneumothorax. 2. Similar positioning of the support tubes and lines, as described above. Electronically Signed   By: Rogelia Myers M.D.   On: 10/04/2024 10:04   DG Chest 1 View Result Date: 10/02/2024 EXAM: 1 VIEW(S) XRAY OF THE CHEST 10/02/2024 08:03:00 AM COMPARISON: 09/27/2024 CLINICAL HISTORY: ARDS (adult respiratory distress syndrome) (HCC) FINDINGS: LINES, TUBES AND DEVICES: Stable endotracheal tube. Enteric feeding tube in place, courses to abdomen with tip not included. Stable right IJ central line. LUNGS AND PLEURA: Increased confluence of left lung base opacity with obscuration of left hemidiaphragm. Improved left lung aeration with decreased but persistent widespread left lung airspace opacity. Possible left pleural effusion. No pneumothorax. HEART AND MEDIASTINUM: No acute abnormality of the cardiac and mediastinal silhouettes. BONES AND SOFT TISSUES: Chronic right shoulder calcific tendinosis redemonstrated. No acute osseous abnormality. IMPRESSION: 1. Increased confluence of left lung base opacity with obscuration of left hemidiaphragm. 2. Improved left lung aeration with decreased but persistent widespread left lung airspace opacity. 3. Possible left pleural effusion. Electronically signed by: Evalene Coho MD 10/02/2024 08:21 AM EST RP Workstation: [id number]   DG Abd 1 View Result Date: 10/02/2024 EXAM: 1 VIEW XRAY OF THE ABDOMEN 10/02/2024 08:03:00 AM COMPARISON: None available. CLINICAL HISTORY: Abdominal distension. FINDINGS: LINES, TUBES AND DEVICES: There is a  feeding tube present with its tip in the distal stomach. BOWEL: There is  mild diffuse gaseous distention of the bowel. SOFT TISSUES: Surgical clips are present in the gallbladder fossa. No opaque urinary calculi. BONES: No acute osseous abnormality. LUNGS: There is coarsened streaky opacification/consolidation of the left lung base. IMPRESSION: 1. Mild diffuse gaseous distention of the bowel. 2. Coarsened streaky opacification/consolidation of the left lung base. Electronically signed by: Evalene Coho MD 10/02/2024 08:21 AM EST RP Workstation: [id number]   ECHOCARDIOGRAM COMPLETE Result Date: 09/28/2024    ECHOCARDIOGRAM REPORT   Patient Name:   Bryce Martinez Date of Exam: 09/28/2024 Medical Rec #:  [id number]          Height:       73.0 in Accession #:    [id number]         Weight:       183.0 lb Date of Birth:  1950-12-23          BSA:          2.072 m Patient Age:    73 years           BP:           143/87 mmHg Patient Gender: M                  HR:           104 bpm. Exam Location:  Inpatient Procedure: 2D Echo, Cardiac Doppler, Color Doppler and Intracardiac            Opacification Agent (Both Spectral and Color Flow Doppler were            utilized during procedure). Indications:     Shock  History:         Patient has no prior history of Echocardiogram examinations.                  Signs/Symptoms:Shortness of Breath and Dyspnea; Risk                  Factors:Hypertension and Dyslipidemia. Covid positive.  Sonographer:     Ellouise Mose RDCS Referring Phys:  [id number] VERDON RAMAN DESAI Diagnosing Phys: Darryle Decent MD  Sonographer Comments: Echo performed with patient supine and on artificial respirator. IMPRESSIONS  1. Left ventricular ejection fraction, by estimation, is 25 to 30%. The left ventricle has severely decreased function. The left ventricle demonstrates global hypokinesis. There is mild concentric left ventricular hypertrophy. Left ventricular diastolic  parameters are indeterminate.  2.  Right ventricular systolic function is normal. The right ventricular size is normal. Tricuspid regurgitation signal is inadequate for assessing PA pressure.  3. Moderate pleural effusion in the left lateral region.  4. The mitral valve is grossly normal. Trivial mitral valve regurgitation. No evidence of mitral stenosis.  5. The aortic valve is tricuspid. Aortic valve regurgitation is not visualized. No aortic stenosis is present. Comparison(s): No prior Echocardiogram. FINDINGS  Left Ventricle: Left ventricular ejection fraction, by estimation, is 25 to 30%. The left ventricle has severely decreased function. The left ventricle demonstrates global hypokinesis. Definity  contrast agent was given IV to delineate the left ventricular endocardial borders. The left ventricular internal cavity size was normal in size. There is mild concentric left ventricular hypertrophy. Left ventricular diastolic parameters are indeterminate. Right Ventricle: The right ventricular size is normal. No increase in right ventricular wall thickness. Right ventricular systolic function is normal. Tricuspid regurgitation signal is inadequate for assessing PA pressure. Left Atrium: Left atrial size was normal in size. Right Atrium: Right atrial size was normal in  size. Pericardium: There is no evidence of pericardial effusion. Mitral Valve: The mitral valve is grossly normal. Trivial mitral valve regurgitation. No evidence of mitral valve stenosis. Tricuspid Valve: The tricuspid valve is grossly normal. Tricuspid valve regurgitation is trivial. No evidence of tricuspid stenosis. Aortic Valve: The aortic valve is tricuspid. Aortic valve regurgitation is not visualized. No aortic stenosis is present. Pulmonic Valve: The pulmonic valve was grossly normal. Pulmonic valve regurgitation is trivial. No evidence of pulmonic stenosis. Aorta: The aortic root and ascending aorta are structurally normal, with no evidence of dilitation. Venous: IVC  assessment for right atrial pressure unable to be performed due to mechanical ventilation. IAS/Shunts: No atrial level shunt detected by color flow Doppler. Additional Comments: There is a moderate pleural effusion in the left lateral region.  LEFT VENTRICLE PLAX 2D LVIDd:         4.20 cm     Diastology LVIDs:         3.10 cm     LV e' medial:    6.64 cm/s LV PW:         1.20 cm     LV E/e' medial:  6.7 LV IVS:        1.10 cm     LV e' lateral:   6.09 cm/s LVOT diam:     2.40 cm     LV E/e' lateral: 7.4 LV SV:         53 LV SV Index:   26 LVOT Area:     4.52 cm  LV Volumes (MOD) LV vol d, MOD A2C: 60.2 ml LV vol d, MOD A4C: 47.9 ml LV vol s, MOD A2C: 31.7 ml LV vol s, MOD A4C: 28.4 ml LV SV MOD A2C:     28.5 ml LV SV MOD A4C:     47.9 ml LV SV MOD BP:      25.6 ml RIGHT VENTRICLE             IVC RV S prime:     13.30 cm/s  IVC diam: 2.00 cm TAPSE (M-mode): 1.8 cm LEFT ATRIUM           Index       RIGHT ATRIUM           Index LA diam:      2.30 cm 1.11 cm/m  RA Area:     10.60 cm LA Vol (A2C): 4.2 ml  2.04 ml/m  RA Volume:   19.40 ml  9.36 ml/m LA Vol (A4C): 11.4 ml 5.50 ml/m  AORTIC VALVE LVOT Vmax:   99.80 cm/s LVOT Vmean:  60.900 cm/s LVOT VTI:    0.117 m  AORTA Ao Root diam: 3.50 cm Ao Asc diam:  3.50 cm MITRAL VALVE MV Area (PHT): 3.22 cm    SHUNTS MV Decel Time: 236 msec    Systemic VTI:  0.12 m MV E velocity: 44.80 cm/s  Systemic Diam: 2.40 cm MV A velocity: 52.95 cm/s MV E/A ratio:  0.85 Darryle Decent MD Electronically signed by Darryle Decent MD Signature Date/Time: 09/28/2024/12:29:40 PM    Final (Updated)    DG CHEST PORT 1 VIEW Result Date: 09/27/2024 EXAM: 1 VIEW(S) XRAY OF THE CHEST 09/27/2024 08:43:00 PM COMPARISON: 09/27/2024. CLINICAL HISTORY: [id number] Encounter for central line placement [phone] Encounter for central line placement FINDINGS: LINES, TUBES AND DEVICES: The endotracheal tube is present with the tip measuring 3.6 cm above the carina. The enteric tube is present. The tip is off  the field  of view but below the left hemidiaphragm. A right central venous catheter has been placed with the tip over the lower SVC region. LUNGS AND PLEURA: Patchy airspace disease in both lungs, more prominent on the left. This is similar to the prior study and likely represents edema, pneumonia or aspiration. No pleural effusion. No pneumothorax. HEART AND MEDIASTINUM: No acute abnormality of the cardiac and mediastinal silhouettes. Mediastinal contours appear intact. BONES AND SOFT TISSUES: Degenerative changes in the right shoulder. IMPRESSION: 1. Right internal jugular central venous catheter with tip projecting over the lower SVC, without pneumothorax. 2. Patchy bilateral airspace disease, left greater than right, similar to the prior exam, likely representing edema, pneumonia, or aspiration. 3. No pleural effusion. Electronically signed by: Elsie Gravely MD 09/27/2024 08:47 PM EST RP Workstation: [id number]   DG Chest Port 1 View Result Date: 09/27/2024 EXAM: 1 VIEW(S) XRAY OF THE CHEST 09/27/2024 01:12:18 PM COMPARISON: 09/27/2024 CLINICAL HISTORY: POST INTUBATION FINDINGS: LINES, TUBES AND DEVICES: Endotracheal tube in place with tip 2.5 cm above the carina. Enteric tube in place with tip and side port overlying expected gastric lumen. Right upper quadrant surgical clips noted. LUNGS AND PLEURA: Persistent diffuse left lung airspace opacity potentially from pneumonia with involvement of the lower lobe and potentially parts of the upper lobe. New right perihilar and infrahilar airspace opacity. Appearance suspicious for multilobar pneumonia. Cannot exclude layering left pleural effusion. No pneumothorax. HEART AND MEDIASTINUM: No acute abnormality of the cardiac and mediastinal silhouettes. BONES AND SOFT TISSUES: Irregularity and deformity of the right acromion and potentially of the adjacent right humeral head. IMPRESSION: 1. Worsening multilobar pneumonia with increased involvement in the right  perihilar and infrahilar regions , and continued substantial left lung airspace opacity. 2. Endotracheal and enteric tubes in expected positions. 3. Stable irregularity and deformity of the right acromion and adjacent right humeral head. Electronically signed by: Ryan Salvage MD 09/27/2024 01:50 PM EST RP Workstation: [id number]   DG Chest Port 1 View Result Date: 09/27/2024 CLINICAL DATA:  Shortness of breath. EXAM: PORTABLE CHEST 1 VIEW COMPARISON:  Chest radiograph dated 10/09/2022. FINDINGS: Diffuse airspace opacity in the left lung most consistent with pneumonia. Follow-up to resolution recommended. The right lung is clear. A small left pleural effusion may be present. No pneumothorax. Stable cardiac silhouette. No acute osseous pathology. IMPRESSION: Left lung pneumonia. Follow-up to resolution recommended. Electronically Signed   By: Vanetta Chou M.D.   On: 09/27/2024 10:59    Labs:  Basic Metabolic Panel: Recent Labs  Lab 10/14/24 0608 10/15/24 0548 10/16/24 0557 10/17/24 0655 10/18/24 0638  NA 128* 126* 132* 132* 133*  K 4.2 4.0 4.4 4.3 4.4  CL 100 100 105 104 104  CO2 22 22 20* 21* 22  GLUCOSE 93 92 94 96 95  BUN 19 17 17 14 13   CREATININE 0.98 1.05 0.98 1.19 1.06  CALCIUM  8.1* 8.2* 8.3* 8.5* 8.4*    CBC: Recent Labs  Lab 10/14/24 0608 10/18/24 0638  WBC 9.1 8.6  HGB 11.7* 10.1*  HCT 34.5* 30.4*  MCV 86.0 84.4  PLT 359 328    CBG: Recent Labs  Lab 10/14/24 0534 10/14/24 1133 10/14/24 1703 10/14/24 2059 10/15/24 0718  GLUCAP 95 139* 99 112* 107*    Brief HPI:   VARUN JOURDAN is a 73 y.o. male  is a 73 year old male with PMHx HLD, HTN, Hep C s/p posttreatment,advanced fibrosis. The patient presented to [facility] on 11/24 for routine screening colonoscopy subsequently complicated by  aspiration during the procedure. He was monitored on room air and discharged home. While at home his respiratory status worsened and he returned back to the hospital  same day with complaints of SOB, abdominal pain and back pain. He initially required 15 L oxygen, ABG showed metabolic acidosis. He was initially placed on BIPAP and was treated with IV Unasyn , DuoNeb and steroids in the ER. He was intubated and admitted by CCM and transferred to [facility] course complicated by acute systolic CHF where an echo revealed an of EF 25 to 30%, this was felt to be due to septic cardiomyopathy and HF.  AKI and hyperkalemia which later resolved after diuresis and lokelma . He was incidentally found to be Covid positive by PCR.  Cortrak was placed on 11/28 prior to extubation. A swallow evaluation completed on 12/05 showed mild dysphagia, SLP reccommended mechanical soft diet and approved upgrade as patient desires. Cortak discontinued on 12/06. Prior to arrival the patient was independent working and driving. He is retired and lives in a one level home with 3 steps to enter. He currently requires min assist for RW, CGA for bed mobility and transfers. Therapy evaluations completed due to patient decreased functional mobility was admitted for a comprehensive rehab program.    Inpatient Rehabilitation Course: WYMON SWANEY was admitted to rehab 10/12/2024 for inpatient therapies to consist of PT, ST and OT at least three hours five days a week. Past admission physiatrist, therapy team and rehab RN have worked together to provide customized collaborative inpatient rehab.  The patient was maintained on Lovenox  for DVT prophylaxis and sequential compression devices bilaterally.  His pain was controlled with lidocaine  patch and Tylenol  as needed.  Atarax  for anxiety and trazodone  as needed due to insomnia, patient reported effectiveness of this course.  Pressure relief measures per protocol.  His diet was advanced to regular diet from dysphagia 3, supplementation vitamin C  and zinc  to continue.  For acute hypoxic respiratory failure due to arts and aspiration  pneumonia patient maintain 98 to 100% on room air and with continued incentive spirometer use. Acute systolic CHF with LVEF 25 to 30%, global hypokinesis was felt to be due to ARDS and cardiogenic shock. Repeat echo recommended in 3 to 4 weeks outpatient.Continue Losartan  100 mg daily.    Blood pressures were monitored per protocol and noted to be stable.  He was tested for COVID and it was positive although it was felt this was related to an old infection.  He remained asymptomatic, continue Robitussin 10 mL twice daily.  Creatinine slightly elevated this was felt to be due to fluid restriction for hyponatremia. Fluid restriction reduced to 2L, sodium improving.  Hyperkalemia resolved with 1 dose of Lokelma  labs are monitored potassium limits.  KUB was ordered and showed ileus, upon recheck ileus had resolved patient having bowel movements with use of MiraLAX  bowel program.  PPI was timed to use before breakfast along with Tums for indigestion.  Chest x-ray was obtained for mild cough showing left lower lobe airspace disease suspicious for pneumonia this was discussed with pharmacy. Patient already on Keflex  for large boil, antibiotic regimen adjusted with the addition of  Azithromycin  for an additional length of 3 days.     Planned Outpatient Follow-Up:  -PCP -PM&R     Rehab course: During patient's stay in rehab weekly team conferences were held to monitor patient's progress, set goals and discuss barriers to discharge. At admission, patient required min assist for RW, CGA  for bed mobility and transfers.    Occupational Therapy:Patient has met 7 of 7 long term goals due to improved activity tolerance, improved balance, postural control, and ability to compensate for deficits.  Patient to discharge at overall Modified Independent level with good understanding and adherence to precautions. He/She will benefit from ongoing OT in outpatient setting to continue to advance functional skills in the area  of BADL.   Physical Therapy: Patient has met 9 of 9 long term goals due to improved activity tolerance, improved balance, improved postural control, increased strength, decreased pain, ability to compensate for deficits, improved attention, improved awareness, and improved coordination.  Patient to discharge at an modified independent. He/She will benefit from ongoing skilled PT services in outpatient setting to continue to advance safe functional mobility, address ongoing impairments in strength, ROM, balance, endurance, gait, and minimize fall risk.   Discharge plan was discussed with patient and/or family member and they verbalized understanding and agreed with it.      Disposition:  Discharge disposition: 06-Home-Health Care Svc        Diet: Regular diet with 2L fluid restriction   Special Instructions:  -Follow up with PCP for labs within 1-2 weeks.   -No driving or operating heavy machinery until cleared by provider  -No smoking or alcohol or illicit drug use     Allergies as of 10/18/2024       Reactions   Amlodipine  Other (See Comments)   unknown        Medication List     STOP taking these medications    meclizine  25 MG tablet Commonly known as: ANTIVERT    Misc. Devices Misc       TAKE these medications    acetaminophen  325 MG tablet Commonly known as: TYLENOL  Take 1-2 tablets (325-650 mg total) by mouth every 4 (four) hours as needed for mild pain (pain score 1-3).   azithromycin  500 MG tablet Commonly known as: ZITHROMAX  Take 1 tablet (500 mg total) by mouth daily for 3 doses. Notes to patient: ENDS ON 10/22/24   cephALEXin  500 MG capsule Commonly known as: KEFLEX  Take 1 capsule (500 mg total) by mouth every 8 (eight) hours for 3 days.   lidocaine  5 % Commonly known as: LIDODERM  Place 1 patch onto the skin daily. Remove & Discard patch within 12 hours or as directed by MD   losartan  100 MG tablet Commonly known as: COZAAR  TAKE 1 TABLET  BY MOUTH EVERY DAY   pantoprazole  40 MG tablet Commonly known as: PROTONIX  Take 1 tablet (40 mg total) by mouth daily before breakfast.   polyethylene glycol 17 g packet Commonly known as: MIRALAX  / GLYCOLAX  Take 17 g by mouth daily.   senna-docusate 8.6-50 MG tablet Commonly known as: Senokot-S Take 2 tablets by mouth 2 (two) times daily.   sodium chloride  1 g tablet Take 1 tablet (1 g total) by mouth daily for 7 days.   traZODone  50 MG tablet Commonly known as: DESYREL  Take 1 tablet (50 mg total) by mouth at bedtime.        Follow-up Information     [facility] Follow up.   Why: Call for a Hospital Follow up Appointment.  Repeat 2d echo as an outpatient in 3-4 weeks. Contact information: [address] [facility] [place] [address] [phone]                 Signed: Daphne LOISE Satterfield 10/20/2024, 10:44 AM

## 2024-10-15 NOTE — IPOC Note (Signed)
 Overall Plan of Care [facility]) Patient Details Name: Bryce Martinez MRN: [id number] DOB: [birth year]  Admitting Diagnosis: Critical illness myopathy  Hospital Problems: Principal Problem:   Critical illness myopathy Active Problems:   Malnutrition of moderate degree     Functional Problem List: Nursing Bladder, Safety, Bowel, Endurance, Medication Management, Pain  PT Balance, Endurance, Pain  OT Balance, Endurance  SLP    TR         Basic ADLs: OT Bathing, Dressing, Toileting     Advanced  ADLs: OT None     Transfers: PT Bed Mobility, Bed to Chair, Car, Occupational Psychologist, Research Scientist (life Sciences): PT Ambulation, Stairs     Additional Impairments: OT None  SLP        TR      Anticipated Outcomes Item Anticipated Outcome  Self Feeding n/a  Swallowing      Basic self-care  mod I  Toileting  mod I   Bathroom Transfers mod I  Bowel/Bladder  manage bowel w mod I assist and bladder w toileting  Transfers  Mod I  Locomotion  Mod I  Communication     Cognition     Pain  Pain < 4 with prns  Safety/Judgment  manage safety w cues   Therapy Plan: PT Intensity: Minimum of 1-2 x/day ,45 to 90 minutes PT Frequency: 5 out of 7 days PT Duration Estimated Length of Stay: 5-7 days OT Intensity: Minimum of 1-2 x/day, 45 to 90 minutes OT Frequency: 5 out of 7 days OT Duration/Estimated Length of Stay: 3 -7 days     Team Interventions: Nursing Interventions Patient/Family Education, Medication Management, Bladder Management, Discharge Planning, Bowel Management, Disease Management/Prevention  PT interventions Ambulation/gait training, Discharge planning, Functional mobility training, Psychosocial support, Therapeutic Activities, Visual/perceptual remediation/compensation, Balance/vestibular training, Disease management/prevention, Neuromuscular re-education, Skin care/wound management, Therapeutic Exercise, Wheelchair propulsion/positioning, Cognitive  remediation/compensation, DME/adaptive equipment instruction, Pain management, Splinting/orthotics, UE/LE Strength taining/ROM, Community reintegration, Development worker, international aid stimulation, Patient/family education, Museum/gallery curator, UE/LE Coordination activities  OT Interventions Warden/ranger, Discharge planning, Self Care/advanced ADL retraining, Functional mobility training, Patient/family education, Therapeutic Exercise, Community reintegration, Fish Farm Manager, Psychosocial support, UE/LE Strength taining/ROM, Therapeutic Activities  SLP Interventions    TR Interventions    SW/CM Interventions Discharge Planning, Psychosocial Support, Patient/Family Education   Barriers to Discharge MD  Medical stability, Home enviroment access/loayout, and Lack of/limited family support  Nursing Decreased caregiver support, Home environment access/layout 1 level 3 ste bil rail w spouse  PT Home environment access/layout endurance, pain  OT None    SLP      SW Decreased caregiver support, Lack of/limited family support, Community Education Officer for SNF coverage     Team Discharge Planning: Destination: PT-Home ,OT- Home , SLP-  Projected Follow-up: PT-Outpatient PT, OT-  None, SLP-  Projected Equipment Needs: PT-None recommended by PT, To be determined, OT- To be determined, SLP-  Equipment Details: PT- , OT-  Patient/family involved in discharge planning: PT- Patient,  OT-Patient, Family member/caregiver, SLP-   MD ELOS: ~7 days Medical Rehab Prognosis:  Excellent Assessment: The patient has been admitted for CIR therapies with the diagnosis of ICU myopathy. The team will be addressing functional mobility, strength, stamina, balance, safety, adaptive techniques and equipment, self-care, bowel and bladder mgt, patient and caregiver education, . Goals have been set at mod I. Anticipated discharge destination is home with wife.        See Team Conference Notes for weekly updates  to the plan  of care

## 2024-10-15 NOTE — Progress Notes (Signed)
 Physical Therapy Discharge Summary  Patient Details  Name: Bryce Martinez MRN: [id number] Date of Birth: Jun 04, 1951  Date of Discharge from PT service:{Time; dates multiple:304500300}  {CHL IP REHAB PT TIME CALCULATION:304800500}   Patient has met {NUMBERS 0-12:18577} of 9 long term goals due to improved activity tolerance, improved balance, and ability to compensate for deficits.  Patient to discharge at an ambulatory level Modified Independent.   Patient's care partner is independent to provide the necessary physical assistance at discharge. Pt to d/c home with his wife. No formal family ed required as pt is competent to direct care.   Reasons goals not met: ***  Recommendation:  Patient will benefit from ongoing skilled PT services in outpatient setting to continue to advance safe functional mobility, address ongoing impairments in strength, endurance, balance, and minimize fall risk.  Equipment: rollator  Reasons for discharge: treatment goals met and discharge from hospital  Patient/family agrees with progress made and goals achieved: Yes  PT Discharge Precautions/Restrictions   Vital Signs   Pain   Pain Interference   Vision/Perception     Cognition   Sensation   Motor     Mobility   Locomotion     Trunk/Postural Assessment     Balance   Extremity Assessment            Schuyler JAYSON Batter 10/15/2024, 4:41 PM

## 2024-10-15 NOTE — Progress Notes (Signed)
 This chaplain responded to unit consult for notarizing the Pt. Advance Directive.   The chaplain understands the Pt. is clarifying acceptance of the role of HCPOA. The Pt. will update spiritual care through the RN when ready to proceed with notary. The chaplain provided education on M-F notary services.  Chaplain Leeroy Hummer [phone]

## 2024-10-15 NOTE — Consult Note (Signed)
 Neuropsychological Consultation Comprehensive Inpatient Rehab   Patient:   Bryce Martinez   DOB:   1951-07-11  MR Number:  [id number]  [facility] [address] [place] [employer] [place] [address] Dept: [phone] Loc: [phone]           Date of Service:   10/15/2024  Start Time:   9 AM End Time:   10 AM  Provider/Observer:  Norleen Asa, Psy.D.       Clinical Neuropsychologist       Billing Code/Service: [phone]  Reason for Service:    Bryce Martinez is a 73 year old male referred for neuropsychological consultation due to coping and adjustment issues and currently admitted to the comprehensive inpatient rehabilitation unit.  His hospitalization followed the development of aspiration pneumonia with significant/subsequent complications.  Assessment to evaluate cognitive and emotional status, coping, and readiness for rehabilitation.  Presenting Concerns: Reports frustration and feeling devastated due to loss of independence. Expresses difficulty understanding the current situation and what is happening. Reports no memory of the acute event.  Relevant Clinical History: - Neurological: No history of brain injury, stroke, or seizure disorder reported. - Psychiatric: No current or past psychiatric diagnoses reported. - Medical: PMHx of HLD, HTN, Hep C s/p post-treatment, and advanced fibrosis. Hospitalized on 09/27/2024 for routine colonoscopy, complicated by aspiration. Developed respiratory distress, metabolic acidosis, and required intubation. Hospital course complicated by acute systolic CHF (EF 74-69%), AKI, hyperkalemia, and incidental COVID-19 positivity. Swallow evaluation on 10/08/2024 showed mild dysphagia, recommended mechanical soft diet.  Functional Capacity: Prior to admission, was independent with work, driving, and all ADLs. Currently requires minimal assistance for rolling walker use and  contact guard assistance for bed mobility and transfers. Lives in a one-level home with three steps to enter.  Mental State Examination: - Appearance and Behaviour: Appears fatigued but cooperative. - Speech: Normal rate and volume. Spontaneous. - Mood and Affect: Subjective mood is frustrated and devastated. Affect is congruent with mood. - Thought Process/Content: Thought process is clear and coherent. No evidence of psychosis. Preoccupied with loss of function and independence. - Cognition: Alert and oriented. Attention appears intact. Reports no memory of the acute event. - Insight and Judgement: Demonstrates good insight into his current functional limitations and the impact on his life. Judgement appears intact.  Clinical Impressions: Presents with significant frustration and adjustment difficulties related to acute functional decline and hospitalization. Cognitively appears intact, with no gross deficits noted on brief examination. Primary challenge is psychological adjustment to current limitations and engagement in the rehabilitation process. Likely to benefit from psychoeducation and cognitive-behavioral strategies to reframe negative thoughts and focus on recovery.  Recommendations and Next Steps: - Provided psychoeducation on the rehabilitation process, emphasizing the expectation for recovery and the importance of early mobilization. - Introduced cognitive reframing technique: adding right now or not yet to thoughts of inability to perform tasks. - Encouraged to continue working hard with the therapy team.          Electronically Signed   _______________________ Norleen Asa, Psy.D. Clinical Neuropsychologist

## 2024-10-15 NOTE — Progress Notes (Signed)
 Physical Therapy Session Note  Patient Details  Name: Bryce Martinez MRN: [id number] Date of Birth: 04-17-1951  Today's Date: 10/15/2024 PT Individual Time: 1000-1115, 1400-1430 PT Individual Time Calculation (min): 75 min, 30 min   Short Term Goals: Week 1:  PT Short Term Goal 1 (Week 1): STG=LTG 2/2 ELOS  Skilled Therapeutic Interventions/Progress Updates:    Session 1: pt received in bed and agreeable to therapy. No complaint of pain. Pt reports not sleeping well d/t indigestion.   Bed mobility with supervision, no use of bed features. ambulatory transfer to rollator with no AD and supervision, then gait with rollator and supervision to gym and throughout session.   Pt performed circuit of the following: -x 5 STS -x 20 marches -90 ft gait Supervision throughout, pt required 1.5-2 minutes rest between bouts and tolerated x 4 bouts, becoming fatigued but able to continue after 3rd round.   Pt performed the following mat level exercises to promote LE strength and endurance while conserving energy to allow for full participation in session: -glute bridges 4 x 10  -supine marches to table top, 3 x 10   Pt returned to room for toileting during session. Supervision for gait and for 3/3 toileting tasks. ambulatory transfer to sink for hand hygiene in standing. Note pt with more difficulty maintaining upright posture with fatigue.   Pt remained in bed at end of session with his wife present and needs in reach.    Session 2: Pt recd seated EOB, No complaint of pain. Pt ambulated to and from therapy gym with supervision and rollator. Discussed pt's energy levels using gas tank analogy. Pt used nustep at level 3, 2 x 5 min with 2 min rest. Cued pt to go as slow as needed to make it whole 5 min to work on self pacing and management of intensity. Pt reports RPE of 12/20. Discussed keeping this scale in mind for pacing in every day life. Pt returned to room in the same manner and to bed  with supervision, was left with all needs in reach and alarm active.    Therapy Documentation Precautions:  Precautions Precautions: Fall Recall of Precautions/Restrictions: Intact Precaution/Restrictions Comments: orthostatic hypotension Restrictions Weight Bearing Restrictions Per Provider Order: No General:      Therapy/Group: Individual Therapy  Kaulana Brindle C Meagen Limones 10/15/2024, 10:18 AM

## 2024-10-16 DIAGNOSIS — K5900 Constipation, unspecified: Secondary | ICD-10-CM

## 2024-10-16 DIAGNOSIS — R051 Acute cough: Secondary | ICD-10-CM | POA: Diagnosis not present

## 2024-10-16 DIAGNOSIS — E871 Hypo-osmolality and hyponatremia: Secondary | ICD-10-CM

## 2024-10-16 DIAGNOSIS — E875 Hyperkalemia: Secondary | ICD-10-CM

## 2024-10-16 DIAGNOSIS — K30 Functional dyspepsia: Secondary | ICD-10-CM | POA: Diagnosis not present

## 2024-10-16 DIAGNOSIS — I10 Essential (primary) hypertension: Secondary | ICD-10-CM

## 2024-10-16 LAB — BASIC METABOLIC PANEL WITH GFR
Anion gap: 7 (ref 5–15)
BUN: 17 mg/dL (ref 8–23)
CO2: 20 mmol/L — ABNORMAL LOW (ref 22–32)
Calcium: 8.3 mg/dL — ABNORMAL LOW (ref 8.9–10.3)
Chloride: 105 mmol/L (ref 98–111)
Creatinine, Ser: 0.98 mg/dL (ref 0.61–1.24)
GFR, Estimated: >60 mL/min (ref >60)
Glucose, Bld: 94 mg/dL (ref 70–99)
Potassium: 4.4 mmol/L (ref 3.5–5.1)
Sodium: 132 mmol/L — ABNORMAL LOW (ref 135–145)

## 2024-10-16 MED ADMIN — Pantoprazole Sodium EC Tab 40 MG (Base Equiv): 40 mg | ORAL | NDC 65862056099

## 2024-10-16 NOTE — Progress Notes (Signed)
 PROGRESS NOTE   Subjective/Complaints: Bloating yesterday, improved after BM this AM and he feels better. No additional complaints.    ROS:   Pt denies fevers, SOB, abdominal pain, CP, N/V/ (+)C/D, and vision changes   Per HPI  Objective:   No results found.  Recent Labs    10/14/24 0608  WBC 9.1  HGB 11.7*  HCT 34.5*  PLT 359   Recent Labs    10/15/24 0548 10/16/24 0557  NA 126* 132*  K 4.0 4.4  CL 100 105  CO2 22 20*  GLUCOSE 92 94  BUN 17 17  CREATININE 1.05 0.98  CALCIUM  8.2* 8.3*    Intake/Output Summary (Last 24 hours) at 10/16/2024 1034 Last data filed at 10/16/2024 0900 Gross per 24 hour  Intake 1200 ml  Output --  Net 1200 ml        Physical Exam: Vital Signs Blood pressure 107/61, pulse 70, temperature 99.1 F (37.3 C), temperature source Oral, resp. rate 16, height 6' 1 (1.854 m), weight 72.2 kg, SpO2 97%.     General: awake, alert, appropriate, laying in bed, wife in room; NAD HENT: conjugate gaze; oropharynx moist CV: regular rate and rhythm; no JVD Pulmonary: CTA B/L; no W/R/R- good air movement GI: soft, NT, + BS Psychiatric: appropriate  Neurological: Ox3  Skin: can feel what appears to be boil near crease on L lower buttock- no skin changes externally - slightly less palpable today Musculoskeletal:     Cervical back: Neck supple.     Comments: Deltoids 2/5; Biceps/triceps 3+/5; WE/grip and FA 5-/5 B/L LE's- HF 3-/5; KE/KF 4/5 and DF/PF 5-/5 B/L  Skin:    General: Skin is warm and dry.     Comments: No skin breakdown on skin that's easily seen  Neurological:     Mental Status: He is oriented to person, place, and time.     Comments: Ox3 Intact to light touch in all 4 extremities Decreased DTR's throughout    Assessment/Plan: 1. Functional deficits which require 3+ hours per day of interdisciplinary therapy in a comprehensive inpatient rehab setting. Physiatrist  is providing close team supervision and 24 hour management of active medical problems listed below. Physiatrist and rehab team continue to assess barriers to discharge/monitor patient progress toward functional and medical goals  Care Tool:  Bathing    Body parts bathed by patient: Right arm, Left arm, Chest, Abdomen, Front perineal area, Buttocks, Right upper leg, Left upper leg, Right lower leg, Left lower leg, Face         Bathing assist Assist Level: Supervision/Verbal cueing     Upper Body Dressing/Undressing Upper body dressing   What is the patient wearing?: Pull over shirt    Upper body assist Assist Level: Independent    Lower Body Dressing/Undressing Lower body dressing      What is the patient wearing?: Underwear/pull up, Pants     Lower body assist Assist for lower body dressing: Supervision/Verbal cueing     Toileting Toileting    Toileting assist Assist for toileting: Supervision/Verbal cueing     Transfers Chair/bed transfer  Transfers assist     Chair/bed transfer assist level: Contact Guard/Touching  assist     Locomotion Ambulation   Ambulation assist      Assist level: Contact Guard/Touching assist Assistive device: No Device Max distance: 86   Walk 10 feet activity   Assist     Assist level: Contact Guard/Touching assist Assistive device: No Device   Walk 50 feet activity   Assist    Assist level: Contact Guard/Touching assist Assistive device: No Device    Walk 150 feet activity   Assist Walk 150 feet activity did not occur: Safety/medical concerns (fatigue/SOB)         Walk 10 feet on uneven surface  activity   Assist     Assist level: Minimal Assistance - Patient > 75% Assistive device:  (HHA)   Wheelchair     Assist Is the patient using a wheelchair?: Yes Type of Wheelchair: Manual    Wheelchair assist level: Supervision/Verbal cueing Max wheelchair distance: 150    Wheelchair 50 feet  with 2 turns activity    Assist        Assist Level: Supervision/Verbal cueing   Wheelchair 150 feet activity     Assist      Assist Level: Supervision/Verbal cueing   Blood pressure 107/61, pulse 70, temperature 99.1 F (37.3 C), temperature source Oral, resp. rate 16, height 6' 1 (1.854 m), weight 72.2 kg, SpO2 97%.  Medical Problem List and Plan: 1. Functional deficits secondary to ICU myopathy after prolonged ICU/intubation             -patient may  shower             -ELOS/Goals: 18-22 days- min A to supervision- SLP mod I  D/c set for 12/15           Con't CIR PT and OT  Continue CIR 2.  Antithrombotics: -DVT/anticoagulation:  Mechanical: Sequential compression devices, below knee Bilateral lower extremities Pharmaceutical: Heparin   12/10- changed to Lovenox - not allergic and less shots             -antiplatelet therapy: N/A   3. Pain Management: Lidoderm  patch daily. Tylenol  prn    12/10- denies pain- 4. Mood/Behavior/Sleep: LCSW to follow for evaluation and support when available.              -antipsychotic agents: N/A             -anxiety: Atarax  10 mg prn              -sleep: Melatonin 3 mg    12/12- changed to Trazodone - still not sleeping great due to Indigestions, so won't increase trazodone  yet 5. Neuropsych/cognition: This patient is capable of making decisions on his own behalf.   6. Skin/Wound Care: Routine pressure relief measures.    7. Fluids/Electrolytes/Nutrition: Monitor strict I&O and daily weights.  Follow up labs CBC/CMP. RD consulted for ongoing care. Patient found to be highly intolerant of lactose.              -Dys 3 with Mallie Pinion vs Ensure supplement              -Vitamin C  and Zinc     12/11- pt now on regular diet 8. Acute Hypoxic resp failure due to ARDS: From aspiration PNA. Extubated on 12/1 and has been 98-100% on room air.  -Continue Ins spiro.    9. Acute Systolic CHF: LVEF 25 to 30%, global hypokinesis due to ARDS and  cardiogenic shock. Repeat echo recommended in 3 to 4 weeks outpatient. Continue Losartan  100  mg daily.    12/12- probably the reason pt so exhausted is cardiac- d/w pt- reiterated with pt/wife 10. HTN: Monitor BP per protocol. Continue Losartan .    112/11- pt's BP looks great in computer- per wife was 70/s80's lastnight, but see no record, nor dropping in therapy- will monitor for low BP  12/13 BP stable, continue to monitor      10/16/2024    5:38 AM 10/15/2024    7:45 PM 10/15/2024    5:02 PM  Vitals with BMI  Systolic 107 120 855  Diastolic 61 73 73  Pulse 70 82 76     11. Covid +: felt to be related to older infection, PCR still positive. Asymptomatic, does not require precautions.              -Robitussin 10 ml BID    12. AKIAzotemia; Cr stable at 0.90.    12/10- Pt's BUN down to 24 form 37- max 50's- so doing better  12/12- BUN down to 17 and Cr 1.05  12/13 BUN Cr stable 0.98/17 13. Hyperkalemia: Required Lokelma  x1, resolved.  Monitor Labs.    12/10- K+ 4.5  12/11- K+ 4.2- Cr 0.98 and BUN 19- down from 25  12/13 K+ stable 4.4 14. Hx of Hep C s/p treatment, advanced fibrosis: 12/5  AST/ALT 44/72. Monitor with labs.  F/u outpatient with GI.    15. T2DM? Wife and pt say never been dx'd with DM: Hgb A1c 5.5 09/2017, glucose has been stable requiring minimal insulin .              -monitor BS ac/hs and use SSI, could d/c at some point.   12/12- d/c;d BG checks since not diabetic per family and well controlled 16. Abdominal bloating/Ileus: KUB ordered, continue Senna S. LBM character not documented.    12/10- Pt had 1 BM yesterday late AM and at 3am- large- per KUB, had ileus yesterday evening- will  recheck KUB in AM and see since is going more regularly now.   12/12- LBM 2 days ago- feeling bloated again- will give Sorbitol - if doesn't improve, will need another KUB  12/13 improved after BM today 17. Hyponatremia  12/11- Na down to 128- will recheck in AM and if lower, will  water  restrict, and check labs  12/12- Na down to 126- will put on fluid restriction and add NaCl tabs 1 gram BID- and recheck in AM  12/12 Na improved to 132, continue to monitor  18. Skin boil- large  12/11- will start Keflex  500 mg q8 hours for 5 days to treat boil- so doesn't open up/cause skin breakdown.   12/12- is somewhat better     LOS: 4 days A FACE TO FACE EVALUATION WAS PERFORMED  Bryce Martinez 10/16/2024, 10:34 AM

## 2024-10-16 NOTE — Plan of Care (Signed)
°  Problem: Consults Goal: RH SPINAL CORD INJURY PATIENT EDUCATION Description:  See Patient Education module for education specifics.  Outcome: Progressing   Problem: SCI BOWEL ELIMINATION Goal: RH STG MANAGE BOWEL WITH ASSISTANCE Description: STG Manage Bowel with  mod I Assistance. Outcome: Progressing Goal: RH STG SCI MANAGE BOWEL WITH MEDICATION WITH ASSISTANCE Description: STG SCI Manage bowel with medication with mod I assistance. Outcome: Progressing   Problem: SCI BLADDER ELIMINATION Goal: RH STG MANAGE BLADDER WITH ASSISTANCE Description: STG Manage Bladder With toileting Assistance Outcome: Progressing   Problem: RH PAIN MANAGEMENT Goal: RH STG PAIN MANAGED AT OR BELOW PT'S PAIN GOAL Description: Pain < 4 with prns Outcome: Progressing   Problem: RH KNOWLEDGE DEFICIT SCI Goal: RH STG INCREASE KNOWLEDGE OF SELF CARE AFTER SCI Description: Patient and spouse will be able to manage care at discharge using educational resources for medications and dietary modification, skin care and bowel independently Outcome: Progressing

## 2024-10-17 ENCOUNTER — Inpatient Hospital Stay (HOSPITAL_COMMUNITY)

## 2024-10-17 DIAGNOSIS — K30 Functional dyspepsia: Secondary | ICD-10-CM

## 2024-10-17 DIAGNOSIS — R051 Acute cough: Secondary | ICD-10-CM

## 2024-10-17 DIAGNOSIS — E875 Hyperkalemia: Secondary | ICD-10-CM | POA: Diagnosis not present

## 2024-10-17 DIAGNOSIS — I10 Essential (primary) hypertension: Secondary | ICD-10-CM | POA: Diagnosis not present

## 2024-10-17 DIAGNOSIS — E871 Hypo-osmolality and hyponatremia: Secondary | ICD-10-CM | POA: Diagnosis not present

## 2024-10-17 LAB — BASIC METABOLIC PANEL WITH GFR
Anion gap: 7 (ref 5–15)
BUN: 14 mg/dL (ref 8–23)
CO2: 21 mmol/L — ABNORMAL LOW (ref 22–32)
Calcium: 8.5 mg/dL — ABNORMAL LOW (ref 8.9–10.3)
Chloride: 104 mmol/L (ref 98–111)
Creatinine, Ser: 1.19 mg/dL (ref 0.61–1.24)
GFR, Estimated: >60 mL/min (ref >60)
Glucose, Bld: 96 mg/dL (ref 70–99)
Potassium: 4.3 mmol/L (ref 3.5–5.1)
Sodium: 132 mmol/L — ABNORMAL LOW (ref 135–145)

## 2024-10-17 MED ORDER — POLYETHYLENE GLYCOL 3350 17 G PO PACK
17.0000 g | PACK | Freq: Every day | ORAL | Status: DC
  Administered 2024-10-17 – 2024-10-18 (×2): 17 g via ORAL
  Filled 2024-10-17 (×2): qty 1

## 2024-10-17 MED ORDER — CEPHALEXIN 250 MG PO CAPS
500.0000 mg | ORAL_CAPSULE | Freq: Three times a day (TID) | ORAL | Status: DC
  Administered 2024-10-17 – 2024-10-18 (×2): 500 mg via ORAL
  Filled 2024-10-17 (×2): qty 2

## 2024-10-17 MED ORDER — PANTOPRAZOLE SODIUM 40 MG PO TBEC
40.0000 mg | DELAYED_RELEASE_TABLET | Freq: Every day | ORAL | Status: DC
  Administered 2024-10-18: 06:00:00 40 mg via ORAL
  Filled 2024-10-17: qty 1

## 2024-10-17 MED ORDER — AZITHROMYCIN 500 MG PO TABS
500.0000 mg | ORAL_TABLET | Freq: Every day | ORAL | Status: DC
  Administered 2024-10-17 – 2024-10-18 (×2): 500 mg via ORAL
  Filled 2024-10-17 (×2): qty 1

## 2024-10-17 MED ADMIN — Pantoprazole Sodium EC Tab 40 MG (Base Equiv): 40 mg | ORAL | NDC 65862056099

## 2024-10-17 NOTE — Progress Notes (Signed)
 Occupational Therapy Discharge Summary  Patient Details  Name: Bryce Martinez MRN: [id number] Date of Birth: [birth year]  Date of Discharge from OT service:October 17, 2024  Today's Date: 10/17/2024 OT Individual Time: 1445-1530 OT Individual Time Calculation (min): 45 min  and Today's Date: 10/17/2024 OT Missed Time: 15 Minutes Missed Time Reason: Patient fatigue   Patient has met 7 of 7 long term goals due to improved activity tolerance, improved balance, postural control, and ability to compensate for deficits.  Patient to discharge at overall Modified Independent level.  Patient's care partner is independent to provide the necessary intermittent supervision assistance at discharge.  Pt D/C at ambulatory level and Mod I for all ADLs.   Reasons goals not met: All goals met   Recommendation:  Patient will benefit from ongoing skilled OT services in outpatient setting to continue to advance functional skills in the area of BADL, iADL, and Reduce care partner burden.  Equipment: Shower seat with back  Reasons for discharge: treatment goals met and discharge from hospital  Patient/family agrees with progress made and goals achieved: Yes  OT Discharge Precautions/Restrictions  Precautions Precautions: Fall Recall of Precautions/Restrictions: Intact Restrictions Weight Bearing Restrictions Per Provider Order: No Pain Pain Assessment Pain Scale: 0-10 Pain Score: 0-No pain ADL ADL Eating: Modified independent Grooming: Modified independent Upper Body Bathing: Modified independent Where Assessed-Upper Body Bathing: Shower Lower Body Bathing: Modified independent Where Assessed-Lower Body Bathing: Shower Upper Body Dressing: Modified independent (Device) Where Assessed-Upper Body Dressing: Chair Lower Body Dressing: Modified independent Where Assessed-Lower Body Dressing: Chair Toileting: Modified independent Where Assessed-Toileting: Teacher, Adult Education: Sales Promotion Account Executive Method: Loss Adjuster, Chartered Method: Ambulating Vision Baseline Vision/History: 0 No visual deficits Patient Visual Report: No change from baseline Vision Assessment?: Wears glasses for reading Perception  Perception: Within Functional Limits Praxis Praxis: WFL Cognition Cognition Overall Cognitive Status: Within Functional Limits for tasks assessed Arousal/Alertness: Awake/alert Orientation Level: Person;Place;Situation Person: Oriented Place: Oriented Situation: Oriented Memory: Appears intact Focused Attention: Appears intact Sustained Attention: Appears intact Selective Attention: Appears intact Awareness: Appears intact Problem Solving: Appears intact Safety/Judgment: Appears intact Brief Interview for Mental Status (BIMS) Repetition of Three Words (First Attempt): 3 Temporal Orientation: Year: Correct Temporal Orientation: Month: Accurate within 5 days Temporal Orientation: Day: Correct Recall: Sock: Yes, no cue required Recall: Blue: Yes, no cue required Recall: Bed: Yes, no cue required BIMS Summary Score: 15 Sensation Sensation Light Touch: Appears Intact Hot/Cold: Appears Intact Proprioception: Appears Intact Stereognosis: Appears Intact Coordination Gross Motor Movements are Fluid and Coordinated: Yes Fine Motor Movements are Fluid and Coordinated: Yes Motor  Motor Motor: Within Functional Limits Mobility  Bed Mobility Bed Mobility: Rolling Right Rolling Right: Independent Rolling Left: Independent Supine to Sit: Independent Sit to Supine: Independent Transfers Sit to Stand: Independent with assistive device Stand to Sit: Independent with assistive device  Trunk/Postural Assessment  Cervical Assessment Cervical Assessment: Within Functional Limits Thoracic Assessment Thoracic Assessment: Within Functional  Limits Lumbar Assessment Lumbar Assessment: Within Functional Limits Postural Control Postural Control: Within Functional Limits  Balance Balance Balance Assessed: Yes Static Sitting Balance Static Sitting - Balance Support: Feet supported Static Sitting - Level of Assistance: 7: Independent Dynamic Sitting Balance Dynamic Sitting - Balance Support: No upper extremity supported Dynamic Sitting - Level of Assistance: 7: Independent Dynamic Sitting - Balance Activities: Reaching for weighted objects Static Standing Balance Static Standing - Balance Support: During functional activity Static Standing - Level of Assistance:  6: Modified independent (Device/Increase time) Dynamic Standing Balance Dynamic Standing - Balance Support: During functional activity Dynamic Standing - Level of Assistance: 6: Modified independent (Device/Increase time) Extremity/Trunk Assessment RUE Assessment RUE Assessment: Within Functional Limits LUE Assessment LUE Assessment: Within Functional Limits  Tx Session 1 General: Pt supine in bed upon OT arrival, agreeable to OT session.  Pain: no pain reported  ADL: pt ambulating from room><therapy gym with rollator and completing various bouts of mobility throughout session at mod I  Exercises: Pt completed 10 minutes of nu step bike in order to increase BUE/BLEstrength and endurance in preparation for increased independence in ADLs and increased endurance. Occasional rest breaks d/t decreased endurance, on hills mode resistance.   Other Treatments: OT providing therapeutic use of self in order to build rapport and discuss patient current situation and goals for therapy.   Pt seated on commode at end of session with wife present, pt with mod I in room.    Session 2  General: Pt supine in bed upon OT arrival, agreeable to OT session.  Pain: no pain reported  Exercises: OT issued UE theraband HEP in order to increase functional strength, andurance  and activity tolerance in order to increase independence in ADLs such as bathing. Pt issued yellow theraband and discussed direction/technique of exercises, demonstrating verbal understanding. Pt completed 3x10 exercises at bed level listed below: -modified squats -side stepping with resistance -wall push ups -UE horizontal abduction with resistance  Other Treatments: OT providing handout for energy conservation describing what it is, purpose of conserving energy and specific examples of how to conserve energy during ADL and IADL tasks. Pt appreciative for information with good carryover.   Pt supine in bed with 2 bed rails up, call light within reach and 4Ps assessed. Wife and sister present.   Camie Hoe, OTD, OTR/L 10/17/2024, 8:11 PM

## 2024-10-17 NOTE — Progress Notes (Addendum)
 PROGRESS NOTE   Subjective/Complaints: Bloating remains improved. Reports occasional indigestion, chronic issue.     ROS:   Pt denies fevers, SOB, abdominal pain, CP, N/V/ (+)C/D, and vision changes + occasional cough  Per HPI  Objective:   No results found.  No results for input(s): WBC, HGB, HCT, PLT in the last 72 hours.  Recent Labs    10/16/24 0557 10/17/24 0655  NA 132* 132*  K 4.4 4.3  CL 105 104  CO2 20* 21*  GLUCOSE 94 96  BUN 17 14  CREATININE 0.98 1.19  CALCIUM  8.3* 8.5*    Intake/Output Summary (Last 24 hours) at 10/17/2024 1019 Last data filed at 10/17/2024 0919 Gross per 24 hour  Intake 1076 ml  Output 500 ml  Net 576 ml        Physical Exam: Vital Signs Blood pressure 124/70, pulse 90, temperature 98.5 F (36.9 C), temperature source Oral, resp. rate 16, height 6' 1 (1.854 m), weight (P) 74.5 kg, SpO2 94%.     General: awake, alert, appropriate, NAD, working with therapy in his room HENT: conjugate gaze; oropharynx moist CV: regular rate and rhythm; no JVD Pulmonary: CTA B/L; no W/R/R- good air movement, non-labored GI: soft, NT, + BS Psychiatric: appropriate , pleasant Neurological: Ox3  Skin: can feel what appears to be boil near crease on L lower buttock- no skin changes externally - slightly less palpable today Musculoskeletal:     Cervical back: Neck supple.     Comments: Deltoids 2/5; Biceps/triceps 3+/5; WE/grip and FA 5-/5 B/L LE's- HF 3-/5; KE/KF 4/5 and DF/PF 5-/5 B/L  Skin:    General: Skin is warm and dry.     Comments: No skin breakdown on skin that's easily seen  Neurological:     Mental Status: He is oriented to person, place, and time.     Comments: Ox3 Intact to light touch in all 4 extremities Decreased DTR's throughout    Assessment/Plan: 1. Functional deficits which require 3+ hours per day of interdisciplinary therapy in a comprehensive  inpatient rehab setting. Physiatrist is providing close team supervision and 24 hour management of active medical problems listed below. Physiatrist and rehab team continue to assess barriers to discharge/monitor patient progress toward functional and medical goals  Care Tool:  Bathing    Body parts bathed by patient: Right arm, Left arm, Chest, Abdomen, Front perineal area, Buttocks, Right upper leg, Left upper leg, Right lower leg, Left lower leg, Face         Bathing assist Assist Level: Supervision/Verbal cueing     Upper Body Dressing/Undressing Upper body dressing   What is the patient wearing?: Pull over shirt    Upper body assist Assist Level: Independent    Lower Body Dressing/Undressing Lower body dressing      What is the patient wearing?: Underwear/pull up, Pants     Lower body assist Assist for lower body dressing: Supervision/Verbal cueing     Toileting Toileting    Toileting assist Assist for toileting: Supervision/Verbal cueing     Transfers Chair/bed transfer  Transfers assist     Chair/bed transfer assist level: Independent with assistive device Chair/bed transfer assistive device:  Other (rollator)   Locomotion Ambulation   Ambulation assist      Assist level: Independent with assistive device Assistive device: Other (comment) (rollator) Max distance: 353ft   Walk 10 feet activity   Assist     Assist level: Independent with assistive device Assistive device: Rollator   Walk 50 feet activity   Assist    Assist level: Independent with assistive device Assistive device: Rollator    Walk 150 feet activity   Assist Walk 150 feet activity did not occur: Safety/medical concerns (fatigue/SOB)  Assist level: Independent with assistive device Assistive device: Rollator    Walk 10 feet on uneven surface  activity   Assist     Assist level: Independent with assistive device Assistive device: Rollator    Wheelchair     Assist Is the patient using a wheelchair?: No Type of Wheelchair: Manual Wheelchair activity did not occur: N/A  Wheelchair assist level: Supervision/Verbal cueing Max wheelchair distance: 150    Wheelchair 50 feet with 2 turns activity    Assist    Wheelchair 50 feet with 2 turns activity did not occur: N/A   Assist Level: Supervision/Verbal cueing   Wheelchair 150 feet activity     Assist  Wheelchair 150 feet activity did not occur: N/A   Assist Level: Supervision/Verbal cueing   Blood pressure 124/70, pulse 90, temperature 98.5 F (36.9 C), temperature source Oral, resp. rate 16, height 6' 1 (1.854 m), weight (P) 74.5 kg, SpO2 94%.  Medical Problem List and Plan: 1. Functional deficits secondary to ICU myopathy after prolonged ICU/intubation             -patient may  shower             -ELOS/Goals: 18-22 days- min A to supervision- SLP mod I  D/c set for 12/15           Con't CIR PT and OT  Continue CIR 2.  Antithrombotics: -DVT/anticoagulation:  Mechanical: Sequential compression devices, below knee Bilateral lower extremities Pharmaceutical: Heparin   12/10- changed to Lovenox - not allergic and less shots             -antiplatelet therapy: N/A   3. Pain Management: Lidoderm  patch daily. Tylenol  prn    12/10- denies pain- 4. Mood/Behavior/Sleep: LCSW to follow for evaluation and support when available.              -antipsychotic agents: N/A             -anxiety: Atarax  10 mg prn              -sleep: Melatonin 3 mg    12/12- changed to Trazodone - still not sleeping great due to Indigestions, so won't increase trazodone  yet 5. Neuropsych/cognition: This patient is capable of making decisions on his own behalf.   6. Skin/Wound Care: Routine pressure relief measures.    7. Fluids/Electrolytes/Nutrition: Monitor strict I&O and daily weights.  Follow up labs CBC/CMP. RD consulted for ongoing care. Patient found to be highly intolerant of  lactose.              -Dys 3 with Mallie Pinion vs Ensure supplement              -Vitamin C  and Zinc     12/11- pt now on regular diet 8. Acute Hypoxic resp failure due to ARDS: From aspiration PNA. Extubated on 12/1 and has been 98-100% on room air.  -Continue Ins spiro.    9. Acute Systolic CHF: LVEF  25 to 30%, global hypokinesis due to ARDS and cardiogenic shock. Repeat echo recommended in 3 to 4 weeks outpatient. Continue Losartan  100 mg daily.    12/12- probably the reason pt so exhausted is cardiac- d/w pt- reiterated with pt/wife 10. HTN: Monitor BP per protocol. Continue Losartan .    112/11- pt's BP looks great in computer- per wife was 70/s80's lastnight, but see no record, nor dropping in therapy- will monitor for low BP  12/13-14 BP stable, continue to monitor      10/17/2024    5:56 AM 10/16/2024    7:21 PM 10/16/2024    1:17 PM  Vitals with BMI  Weight   164 lbs 4 oz  BMI   21.67  Systolic 124 106 897  Diastolic 70 60 64  Pulse 90 72 85     11. Covid +: felt to be related to older infection, PCR still positive. Asymptomatic, does not require precautions.              -Robitussin 10 ml BID    12. AKIAzotemia; Cr stable at 0.90.    12/10- Pt's BUN down to 24 form 37- max 50's- so doing better  12/12- BUN down to 17 and Cr 1.05  12/14  Cr a little higher likely due to fluid restriction, continue to monitor  13. Hyperkalemia: Required Lokelma  x1, resolved.  Monitor Labs.    12/10- K+ 4.5  12/11- K+ 4.2- Cr 0.98 and BUN 19- down from 25  12/14 K+ stable 4.3 14. Hx of Hep C s/p treatment, advanced fibrosis: 12/5  AST/ALT 44/72. Monitor with labs.  F/u outpatient with GI.    15. T2DM? Wife and pt say never been dx'd with DM: Hgb A1c 5.5 09/2017, glucose has been stable requiring minimal insulin .              -monitor BS ac/hs and use SSI, could d/c at some point.   12/12- d/c;d BG checks since not diabetic per family and well controlled 16. Abdominal bloating/Ileus: KUB  ordered, continue Senna S. LBM character not documented.    12/10- Pt had 1 BM yesterday late AM and at 3am- large- per KUB, had ileus yesterday evening- will  recheck KUB in AM and see since is going more regularly now.   12/12- LBM 2 days ago- feeling bloated again- will give Sorbitol - if doesn't improve, will need another KUB  12/13 improved after BM today  12/14 pt reports still feels better, continue to monitor. Will add miralax  17. Hyponatremia  12/11- Na down to 128- will recheck in AM and if lower, will water  restrict, and check labs  12/12- Na down to 126- will put on fluid restriction and add NaCl tabs 1 gram BID- and recheck in AM  12/14 Na stable to 132, continue to monitor  18. Skin boil- large  12/11- will start Keflex  500 mg q8 hours for 5 days to treat boil- so doesn't open up/cause skin breakdown.   12/12- is somewhat better  Pt reports improving from few days ago 19. Cough mild  - 12/14 check CXR  Addendum-chest x-ray indicates left lower lobe airspace disease suspicious for pneumonia.  Not sure if this is new or sequela of prior pneumonia.  Discussed with pharmacy will extend Keflex  and add azithromycin .  - Will also consult SLP for swallow evaluation 20.  Indigestion  - 12/14 Continue PPI-Will adjust timing to before breakfast, Tums as needed-does not appear he is used recently  LOS: 5 days A FACE TO FACE EVALUATION WAS PERFORMED  Murray Collier 10/17/2024, 10:19 AM

## 2024-10-17 NOTE — Plan of Care (Signed)
°  Problem: Sit to Stand Goal: LTG:  Patient will perform sit to stand in prep for activites of daily living with assistance level (OT) Description: LTG:  Patient will perform sit to stand in prep for activites of daily living with assistance level (OT) Outcome: Completed/Met   Problem: RH Grooming Goal: LTG Patient will perform grooming w/assist,cues/equip (OT) Description: LTG: Patient will perform grooming with assist, with/without cues using equipment (OT) Outcome: Completed/Met   Problem: RH Bathing Goal: LTG Patient will bathe all body parts with assist levels (OT) Description: LTG: Patient will bathe all body parts with assist levels (OT) Outcome: Completed/Met   Problem: RH Dressing Goal: LTG Patient will perform lower body dressing w/assist (OT) Description: LTG: Patient will perform lower body dressing with assist, with/without cues in positioning using equipment (OT) Outcome: Completed/Met   Problem: RH Toileting Goal: LTG Patient will perform toileting task (3/3 steps) with assistance level (OT) Description: LTG: Patient will perform toileting task (3/3 steps) with assistance level (OT)  Outcome: Completed/Met   Problem: RH Toilet Transfers Goal: LTG Patient will perform toilet transfers w/assist (OT) Description: LTG: Patient will perform toilet transfers with assist, with/without cues using equipment (OT) Outcome: Completed/Met   Problem: RH Tub/Shower Transfers Goal: LTG Patient will perform tub/shower transfers w/assist (OT) Description: LTG: Patient will perform tub/shower transfers with assist, with/without cues using equipment (OT) Outcome: Completed/Met

## 2024-10-18 DIAGNOSIS — G7281 Critical illness myopathy: Secondary | ICD-10-CM | POA: Diagnosis not present

## 2024-10-18 LAB — BASIC METABOLIC PANEL WITH GFR
Anion gap: 7 (ref 5–15)
BUN: 13 mg/dL (ref 8–23)
CO2: 22 mmol/L (ref 22–32)
Calcium: 8.4 mg/dL — ABNORMAL LOW (ref 8.9–10.3)
Chloride: 104 mmol/L (ref 98–111)
Creatinine, Ser: 1.06 mg/dL (ref 0.61–1.24)
GFR, Estimated: >60 mL/min (ref >60)
Glucose, Bld: 95 mg/dL (ref 70–99)
Potassium: 4.4 mmol/L (ref 3.5–5.1)
Sodium: 133 mmol/L — ABNORMAL LOW (ref 135–145)

## 2024-10-18 LAB — CBC
HCT: 30.4 % — ABNORMAL LOW (ref 39.0–52.0)
Hemoglobin: 10.1 g/dL — ABNORMAL LOW (ref 13.0–17.0)
MCH: 28.1 pg (ref 26.0–34.0)
MCHC: 33.2 g/dL (ref 30.0–36.0)
MCV: 84.4 fL (ref 80.0–100.0)
Platelets: 328 K/uL (ref 150–400)
RBC: 3.6 MIL/uL — ABNORMAL LOW (ref 4.22–5.81)
RDW: 14.5 % (ref 11.5–15.5)
WBC: 8.6 K/uL (ref 4.0–10.5)
nRBC: 0 % (ref 0.0–0.2)

## 2024-10-18 MED ORDER — POLYETHYLENE GLYCOL 3350 17 G PO PACK: 17.0000 g | PACK | Freq: Every day | ORAL | Status: AC

## 2024-10-18 MED ORDER — AZITHROMYCIN 500 MG PO TABS: 500.0000 mg | ORAL_TABLET | Freq: Every day | ORAL | 0 refills | Status: DC

## 2024-10-18 MED ORDER — SENNOSIDES-DOCUSATE SODIUM 8.6-50 MG PO TABS: 2.0000 | ORAL_TABLET | Freq: Two times a day (BID) | ORAL | Status: AC

## 2024-10-18 MED ORDER — CEPHALEXIN 500 MG PO CAPS: 500.0000 mg | ORAL_CAPSULE | Freq: Three times a day (TID) | ORAL | 0 refills | Status: AC

## 2024-10-18 MED ORDER — PANTOPRAZOLE SODIUM 40 MG PO TBEC: 40.0000 mg | DELAYED_RELEASE_TABLET | Freq: Every day | ORAL | 0 refills | Status: AC

## 2024-10-18 MED ORDER — LIDOCAINE 5 % EX PTCH: 1.0000 | MEDICATED_PATCH | Freq: Every day | CUTANEOUS | 0 refills | Status: AC

## 2024-10-18 MED ORDER — TRAZODONE HCL 50 MG PO TABS: 50.0000 mg | ORAL_TABLET | Freq: Every day | ORAL | 0 refills | Status: AC

## 2024-10-18 MED ORDER — ACETAMINOPHEN 325 MG PO TABS: 325.0000 mg | ORAL_TABLET | ORAL | Status: DC | PRN

## 2024-10-18 MED ORDER — SODIUM CHLORIDE 1 G PO TABS: 1.0000 g | ORAL_TABLET | Freq: Every day | ORAL | 0 refills | Status: DC

## 2024-10-18 MED ORDER — CEPHALEXIN 250 MG PO CAPS
500.0000 mg | ORAL_CAPSULE | Freq: Three times a day (TID) | ORAL | Status: DC
  Administered 2024-10-18: 12:00:00 500 mg via ORAL
  Filled 2024-10-18: qty 2

## 2024-10-18 NOTE — Progress Notes (Signed)
 Inpatient Rehabilitation Care Coordinator Discharge Note   Patient Details  Name: Bryce Martinez MRN: [id number] Date of Birth: 1951-09-11   Discharge location: D/c to home  Length of Stay: 5 days  Discharge activity level: Mod I  Home/community participation: Limited  Patient response un:Yzjouy Literacy - How often do you need to have someone help you when you read instructions, pamphlets, or other written material from your doctor or pharmacy?: Never  Patient response un:Dnrpjo Isolation - How often do you feel lonely or isolated from those around you?: Never  Services provided included: RD, MD, OT, PT, RN, CM, TR, Pharmacy, Neuropsych, SW  Financial Services:  Field Seismologist Utilized: [facility]  Choices offered to/list presented to: patient and wife  Follow-up services arranged:  Outpatient, DME    Outpatient Servicies: [facility] D[facility] : [facility] roll[facility] and TTB to home. TBD on date.    Patient response to transportation need: Is the patient able to respond to transportation needs?: Yes In the past 12 months, has lack of transportation kept you from medical appointments or from getting medications?: No In the past 12 months, has lack of transportation kept you from meetings, work, or from getting things needed for daily living?: No   Patient/Family verbalized understanding of follow-up arrangements:  Yes  Individual responsible for coordination of the follow-up plan: contact pt or pt wife  Confirmed correct DME delivered: Graeme DELENA Jude 10/18/2024    Comments (or additional information):fam edu completed  Summary of Stay    Date/Time Discharge Planning CSW  10/14/24 1332 d/c to home with his wife who will be primary caregiver. [facility] (rollator and TTB), and arrange outpatient PT. SW will confirm there are no barriers to discharge. AAC       Kaitlynn Tramontana A Jude

## 2024-10-18 NOTE — Progress Notes (Signed)
 PROGRESS NOTE   Subjective/Complaints:  Pt reports SOB a LOT better with Zithromax  added.  Was told by Weekend doc was pocket of fluid in lung.   Less Indigestion.   We discussed that Na 132 yesterday; 133 today- will loosen PO fluid intake to 2L \ Cannot feel boil anymore.   ROS:  I spent a total of    minutes on total care today- >50% coordination of care- due to   + occasional cough  Per HPI  Objective:   DG Chest 2 View Result Date: 10/17/2024 CLINICAL DATA:  Cough. EXAM: CHEST - 2 VIEW COMPARISON:  10/04/2024 FINDINGS: New diffuse left lung airspace disease is seen mainly involving the lower lobe, suspicious for pneumonia. No pleural effusion. Heart size remains normal. IMPRESSION: New left lung airspace disease mainly involving the left lower lobe, suspicious for pneumonia. Electronically Signed   By: Norleen DELENA Kil M.D.   On: 10/17/2024 15:45    Recent Labs    10/18/24 0638  WBC 8.6  HGB 10.1*  HCT 30.4*  PLT 328    Recent Labs    10/17/24 0655 10/18/24 0638  NA 132* 133*  K 4.3 4.4  CL 104 104  CO2 21* 22  GLUCOSE 96 95  BUN 14 13  CREATININE 1.19 1.06  CALCIUM  8.5* 8.4*    Intake/Output Summary (Last 24 hours) at 10/18/2024 0801 Last data filed at 10/17/2024 1950 Gross per 24 hour  Intake 354 ml  Output 200 ml  Net 154 ml        Physical Exam: Vital Signs Blood pressure 115/64, pulse 72, temperature 98.5 F (36.9 C), temperature source Oral, resp. rate 16, height 6' 1 (1.854 m), weight (P) 74.5 kg, SpO2 96%.    General: awake, alert, appropriate, sitting up at EOB; wife at bedside;  NAD HENT: conjugate gaze; oropharynx moist CV: regular rate and rhythm; no JVD Pulmonary: decreased at bases; slightly more on L than R- good air movement otherwise; no W/R/R GI: soft, NT, ND, (+)BS- less bloating Psychiatric: appropriate- brighter Neurological: Ox3  Skin: cannot palpate Boil  anymore externally  Musculoskeletal:     Cervical back: Neck supple.     Comments: Deltoids 2/5; Biceps/triceps 3+/5; WE/grip and FA 5-/5 B/L LE's- HF 3-/5; KE/KF 4/5 and DF/PF 5-/5 B/L  Skin:    General: Skin is warm and dry.     Comments: No skin breakdown on skin that's easily seen  Neurological:     Mental Status: He is oriented to person, place, and time.     Comments: Ox3 Intact to light touch in all 4 extremities Decreased DTR's throughout    Assessment/Plan: 1. Functional deficits which require 3+ hours per day of interdisciplinary therapy in a comprehensive inpatient rehab setting. Physiatrist is providing close team supervision and 24 hour management of active medical problems listed below. Physiatrist and rehab team continue to assess barriers to discharge/monitor patient progress toward functional and medical goals  Care Tool:  Bathing    Body parts bathed by patient: Right arm, Left arm, Chest, Abdomen, Front perineal area, Buttocks, Right upper leg, Left upper leg, Right lower leg, Left lower leg, Face  Bathing assist Assist Level: Independent with assistive device     Upper Body Dressing/Undressing Upper body dressing   What is the patient wearing?: Pull over shirt    Upper body assist Assist Level: Independent with assistive device    Lower Body Dressing/Undressing Lower body dressing      What is the patient wearing?: Underwear/pull up, Pants     Lower body assist Assist for lower body dressing: Independent with assitive device     Toileting Toileting    Toileting assist Assist for toileting: Independent with assistive device     Transfers Chair/bed transfer  Transfers assist     Chair/bed transfer assist level: Independent with assistive device Chair/bed transfer assistive device: Other (rollator)   Locomotion Ambulation   Ambulation assist      Assist level: Independent with assistive device Assistive device: Other  (comment) (rollator) Max distance: 348ft   Walk 10 feet activity   Assist     Assist level: Independent with assistive device Assistive device: Rollator   Walk 50 feet activity   Assist    Assist level: Independent with assistive device Assistive device: Rollator    Walk 150 feet activity   Assist Walk 150 feet activity did not occur: Safety/medical concerns (fatigue/SOB)  Assist level: Independent with assistive device Assistive device: Rollator    Walk 10 feet on uneven surface  activity   Assist     Assist level: Independent with assistive device Assistive device: Rollator   Wheelchair     Assist Is the patient using a wheelchair?: No Type of Wheelchair: Manual Wheelchair activity did not occur: N/A  Wheelchair assist level: Supervision/Verbal cueing Max wheelchair distance: 150    Wheelchair 50 feet with 2 turns activity    Assist    Wheelchair 50 feet with 2 turns activity did not occur: N/A   Assist Level: Supervision/Verbal cueing   Wheelchair 150 feet activity     Assist  Wheelchair 150 feet activity did not occur: N/A   Assist Level: Supervision/Verbal cueing   Blood pressure 115/64, pulse 72, temperature 98.5 F (36.9 C), temperature source Oral, resp. rate 16, height 6' 1 (1.854 m), weight (P) 74.5 kg, SpO2 96%.  Medical Problem List and Plan: 1. Functional deficits secondary to ICU myopathy after prolonged ICU/intubation             -patient may  shower             -ELOS/Goals: 18-22 days- min A to supervision- SLP mod I  D/c set for 12/15           D/c today  Does NOT need f/u with me- needs to see PCP in next 1 week with labs.  2.  Antithrombotics: -DVT/anticoagulation:  Mechanical: Sequential compression devices, below knee Bilateral lower extremities Pharmaceutical: Heparin   12/10- changed to Lovenox - not allergic and less shots             -antiplatelet therapy: N/A   3. Pain Management: Lidoderm  patch daily.  Tylenol  prn    12/10- denies pain- 4. Mood/Behavior/Sleep: LCSW to follow for evaluation and support when available.              -antipsychotic agents: N/A             -anxiety: Atarax  10 mg prn              -sleep: Melatonin 3 mg    12/12- changed to Trazodone - still not sleeping great due to Indigestions, so won't  increase trazodone  yet 5. Neuropsych/cognition: This patient is capable of making decisions on his own behalf.   6. Skin/Wound Care: Routine pressure relief measures.    7. Fluids/Electrolytes/Nutrition: Monitor strict I&O and daily weights.  Follow up labs CBC/CMP. RD consulted for ongoing care. Patient found to be highly intolerant of lactose.              -Dys 3 with Mallie Pinion vs Ensure supplement              -Vitamin C  and Zinc     12/11- pt now on regular diet 8. Acute Hypoxic resp failure due to ARDS: From aspiration PNA. Extubated on 12/1 and has been 98-100% on room air.  -Continue Ins spiro.    9. Acute Systolic CHF: LVEF 25 to 30%, global hypokinesis due to ARDS and cardiogenic shock. Repeat echo recommended in 3 to 4 weeks outpatient. Continue Losartan  100 mg daily.    12/12- probably the reason pt so exhausted is cardiac- d/w pt- reiterated with pt/wife 10. HTN: Monitor BP per protocol. Continue Losartan .    112/11- pt's BP looks great in computer- per wife was 70/s80's lastnight, but see no record, nor dropping in therapy- will monitor for low BP  12/13-14 BP stable, continue to monitor      10/18/2024    6:11 AM 10/17/2024    7:50 PM 10/17/2024    5:56 AM  Vitals with BMI  Systolic 115 140 875  Diastolic 64 81 70  Pulse 72 73 90     11. Covid +: felt to be related to older infection, PCR still positive. Asymptomatic, does not require precautions.              -Robitussin 10 ml BID    12. AKIAzotemia; Cr stable at 0.90.    12/10- Pt's BUN down to 24 form 37- max 50's- so doing better  12/12- BUN down to 17 and Cr 1.05  12/14  Cr a little higher  likely due to fluid restriction, continue to monitor  13. Hyperkalemia: Required Lokelma  x1, resolved.  Monitor Labs.    12/10- K+ 4.5  12/11- K+ 4.2- Cr 0.98 and BUN 19- down from 25  12/14 K+ stable 4.3  12/15- K+ 4.4 14. Hx of Hep C s/p treatment, advanced fibrosis: 12/5  AST/ALT 44/72. Monitor with labs.  F/u outpatient with GI.    15. T2DM? Wife and pt say never been dx'd with DM: Hgb A1c 5.5 09/2017, glucose has been stable requiring minimal insulin .              -monitor BS ac/hs and use SSI, could d/c at some point.   12/12- d/c;d BG checks since not diabetic per family and well controlled 16. Abdominal bloating/Ileus: KUB ordered, continue Senna S. LBM character not documented.    12/10- Pt had 1 BM yesterday late AM and at 3am- large- per KUB, had ileus yesterday evening- will  recheck KUB in AM and see since is going more regularly now.   12/12- LBM 2 days ago- feeling bloated again- will give Sorbitol - if doesn't improve, will need another KUB  12/13 improved after BM today  12/14 pt reports still feels better, continue to monitor. Will add miralax  17. Hyponatremia  12/11- Na down to 128- will recheck in AM and if lower, will water  restrict, and check labs  12/12- Na down to 126- will put on fluid restriction and add NaCl tabs 1 gram BID- and recheck in AM  12/14 Na stable to 132, continue to monitor   12/15- Na 133- will reduce fluid restriction to 2L advised pt and wife 18. Skin boil- large  12/11- will start Keflex  500 mg q8 hours for 5 days to treat boil- so doesn't open up/cause skin breakdown.   12/12- is somewhat better  Pt reports improving from few days ago  12/15- sx's resolved- will con't Keflex  due to possible pneumonia 19. Cough mild  - 12/14 check CXR  Addendum-chest x-ray indicates left lower lobe airspace disease suspicious for pneumonia.  Not sure if this is new or sequela of prior pneumonia.  Discussed with pharmacy will extend Keflex  and add  azithromycin .  12/15- will Con't Zithromax  500 mg daily x 3 days and con't Keflex  for another 3 days 20.  Indigestion  - 12/14 Continue PPI-Will adjust timing to before breakfast, Tums as needed-does not appear he is used recently   I spent a total of  34  minutes on total care today- >50% coordination of care- due to  D/w team about d/c; d/w pt and wife about reduction in fluid restriction- will go to 2L until sees PCP- will need to see in next 7 days.  Reviewed labs, vitals and B/B as well   The patient is medically ready for discharge to home and will not need follow-up with [facility] PM&R. In addition, they will need to follow up with their PCP, cardiology for f/u ECHO and f/u Pt advised to see PCP in 7 days, with labs.     LOS: 6 days A FACE TO FACE EVALUATION WAS PERFORMED  Bryce Martinez 10/18/2024, 8:01 AM

## 2024-10-18 NOTE — Progress Notes (Signed)
 Inpatient Rehabilitation Discharge Medication Review by a Pharmacist  A complete drug regimen review was completed for this patient to identify any potential clinically significant medication issues.  High Risk Drug Classes Is patient taking? Indication by Medication  Antipsychotic No   Anticoagulant No   Antibiotic Yes Azithromycin , Cephalexin  for 3 more days - pneumonia coverage  Opioid No   Antiplatelet No   Hypoglycemics/insulin  No   Vasoactive Medication Yes Losartan  - hypertension  Chemotherapy No   Other Yes Lidocaine  patch (lower back) - topical pain relief Pantoprazole  - indigestion Sodium chloride  tabs for 7 more days - hyponatremia (begun 12/12, improving) Trazodone  - sleep Miralax , senna-docuate - laxatives  PRNs: Acetaminophen  - mild pain     Type of Medication Issue Identified Description of Issue Recommendation(s)  Drug Interaction(s) (clinically significant)     Duplicate Therapy     Allergy     No Medication Administration End Date     Incorrect Dose     Additional Drug Therapy Needed     Significant med changes from prior encounter (inform family/care partners about these prior to discharge). All meds are new, except Losartan .  Prior prn Meclizine  discontinued. Communicate changes with patient/ family/ caregiver prior to discharge.  Other       Clinically significant medication issues were identified that warrant physician communication and completion of prescribed/recommended actions by midnight of the next day:  No  Name of provider notified for urgent issues identified:   Provider Method of Notification: n/a  Pharmacist comments:  - Cephalexin  initiated 12/11 for boil, noted resolved.  Azithromycin  added 12/14. - sliding scale insulin  discontinued 12/12, was not needing - planning labs with PCP in 1 week  Time spent performing this drug regimen review (minutes):  20 minutes  Genaro Zebedee Calin, RPh 10/18/2024 11:11 AM

## 2024-10-18 NOTE — Discharge Instructions (Addendum)
 Inpatient Rehab Discharge Instructions  ETHRIDGE SOLLENBERGER  Discharge date and time: 10/18/2024   Activities/Precautions/ Functional Status:  Activity: activity as tolerated and no lifting, driving, or strenuous exercise for until cleared by provider.  Diet: regular diet and 2L fluid restriction   Wound Care: none needed   Functional status:  ___ No restrictions     ___ Walk up steps independently ___ 24/7 supervision/assistance   ___ Walk up steps with assistance ___ Intermittent supervision/assistance  ___ Bathe/dress independently _X__ Walk with walker    ___ Bathe/dress with assistance ___ Walk Independently    ___ Shower independently ___ Walk with assistance    ___ Shower with assistance __X_ No alcohol     ___ Return to work/school ________  Special Instructions:  Follow up with pcp for repeat labs and swallow study. -Continue sodium tab 1 gram daily for 1 week.   Follow up for repeat echocardiogram with in 4 weeks.    COMMUNITY REFERRALS UPON DISCHARGE:     Outpatient: PT             [facility] TO [facility] ABOUT SCHEDULING OUTPATIENT PHYSICAL THERAPY. PLEASE EXPECT FOLLOW-UP FROM COMMUNITY CARE TO GIVE YOU UPDATES.    Medical Equipment/Items Ordered:tub transfer bench                                                 Agency[facility]Supplier: [facility] to [facility] to [facility]   My questions have been answered and I understand these instructions. I will adhere to these goals and the provided educational materials after my discharge from the hospital.  Patient/Caregiver Signature _______________________________ Date __________  Clinician Signature _______________________________________ Date __________  Please bring this form and your medication list with you to all your follow-up doctor's appointments.

## 2024-10-18 NOTE — Progress Notes (Addendum)
 Patient ID: Bryce Martinez, male   DOB: Jan 11, 1951, 73 y.o.   MRN: [id number]  SW received updates from PA-Brandi that family had not received DME and was asking about updates on DME. SW emailed [place] discharge group to get updates. NO updates at this time.   *Per [facility]: The transfer bench appears to have been sent to [facility] to process, the [place] required the veteran to be seen by the [facility] department for evaluation for the rollator, he has an appointment on 1/13. SW called pt wife to share above details.     Graeme Jude, MSW, LCSW Office: [phone] Cell: [phone] Fax: [phone]

## 2024-10-22 ENCOUNTER — Encounter: Payer: Self-pay | Admitting: Family Medicine

## 2024-10-22 ENCOUNTER — Ambulatory Visit: Admitting: Family Medicine

## 2024-10-22 VITALS — BP 112/65 | HR 105 | Resp 18 | Ht 73.0 in | Wt 161.0 lb

## 2024-10-22 DIAGNOSIS — F4323 Adjustment disorder with mixed anxiety and depressed mood: Secondary | ICD-10-CM

## 2024-10-22 DIAGNOSIS — E440 Moderate protein-calorie malnutrition: Secondary | ICD-10-CM

## 2024-10-22 DIAGNOSIS — R2681 Unsteadiness on feet: Secondary | ICD-10-CM | POA: Diagnosis not present

## 2024-10-22 DIAGNOSIS — D619 Aplastic anemia, unspecified: Secondary | ICD-10-CM

## 2024-10-22 DIAGNOSIS — J189 Pneumonia, unspecified organism: Secondary | ICD-10-CM | POA: Insufficient documentation

## 2024-10-22 DIAGNOSIS — G7281 Critical illness myopathy: Secondary | ICD-10-CM

## 2024-10-22 DIAGNOSIS — I5021 Acute systolic (congestive) heart failure: Secondary | ICD-10-CM | POA: Diagnosis not present

## 2024-10-22 DIAGNOSIS — E785 Hyperlipidemia, unspecified: Secondary | ICD-10-CM

## 2024-10-22 DIAGNOSIS — G8929 Other chronic pain: Secondary | ICD-10-CM | POA: Diagnosis not present

## 2024-10-22 DIAGNOSIS — J690 Pneumonitis due to inhalation of food and vomit: Secondary | ICD-10-CM | POA: Diagnosis not present

## 2024-10-22 DIAGNOSIS — M5441 Lumbago with sciatica, right side: Secondary | ICD-10-CM | POA: Diagnosis not present

## 2024-10-22 DIAGNOSIS — I10 Essential (primary) hypertension: Secondary | ICD-10-CM

## 2024-10-22 DIAGNOSIS — Z09 Encounter for follow-up examination after completed treatment for conditions other than malignant neoplasm: Secondary | ICD-10-CM

## 2024-10-22 NOTE — Progress Notes (Unsigned)
" ° °  Bryce Martinez     MRN: [id number]      DOB: 06/05/1951  Chief Complaint  Patient presents with   Hypertension    5 month follow up    Foot Swelling    Pt complains of bilateral feet swelling x2 days, believes it is his bp medication     HPI Bryce Martinez is here for follow up . Hospitalized from 12/9 to 10/18/2024, critical illness myopathy as a complication of ARDS following aspiration during colonoscopy C/o generalized weaknessand shortness of breath, no fevert or chills or sputum production though ROS Denies recent fever or chills. Denies sinus pressure, nasal congestion, ear pain or sore throat. Denies chest congestion, productive cough or wheezing. Denies chest pains, palpitations and leg swelling Denies abdominal pain, nausea, vomiting,diarrhea or constipation.   Denies dysuria, frequency, hesitancy or incontinence. Denies joint pain, swelling and limitation in mobility. Denies headaches, seizures, numbness, or tingling. Denies depression, anxiety or insomnia. Denies skin break down or rash.   PE  BP 112/65   Pulse (!) 105   Resp 18   Ht 6' 1 (1.854 m)   Wt 161 lb 0.6 oz (73 kg)   SpO2 95%   BMI 21.25 kg/m   Patient alert and oriented and in no cardiopulmonary distress.  HEENT: No facial asymmetry, EOMI,     Neck supple .  Chest: Clear to auscultation bilaterally.  CVS: S1, S2 no murmurs, no S3.Regular rate.  ABD: Soft non tender.   Ext: No edema  MS: Adequate ROM spine, shoulders, hips and knees.  Skin: Intact, no ulcerations or rash noted.  Psych: Good eye contact, normal affect. Memory intact not anxious or depressed appearing.  CNS: CN 2-12 intact, power,  normal throughout.no focal deficits noted.   Assessment & Plan  No problem-specific Assessment & Plan notes found for this encounter.  "

## 2024-10-22 NOTE — Patient Instructions (Signed)
 F/U first week in February   CBC , iron, ferritin and B12, bmp and EGFr  ONLY bP medication is losartan   You may use the miralax  and senokot as needed  I recommend the protonix  one daily for an additional 2 weeks  Flush nostrils with normal saline twice daily  Repeat CXR last week in January  You are being referred to Cardiology and to Pulmonary Doctors

## 2024-10-23 LAB — CBC WITH DIFFERENTIAL/PLATELET
Basophils Absolute: 0 x10E3/uL (ref 0.0–0.2)
Basos: 0 %
EOS (ABSOLUTE): 0.2 x10E3/uL (ref 0.0–0.4)
Eos: 2 %
Hematocrit: 34 % — ABNORMAL LOW (ref 37.5–51.0)
Hemoglobin: 11 g/dL — ABNORMAL LOW (ref 13.0–17.7)
Immature Grans (Abs): 0.2 x10E3/uL — ABNORMAL HIGH (ref 0.0–0.1)
Immature Granulocytes: 2 %
Lymphocytes Absolute: 1.2 x10E3/uL (ref 0.7–3.1)
Lymphs: 13 %
MCH: 28 pg (ref 26.6–33.0)
MCHC: 32.4 g/dL (ref 31.5–35.7)
MCV: 87 fL (ref 79–97)
Monocytes Absolute: 1 x10E3/uL — ABNORMAL HIGH (ref 0.1–0.9)
Monocytes: 11 %
Neutrophils Absolute: 6.6 x10E3/uL (ref 1.4–7.0)
Neutrophils: 72 %
Platelets: 291 x10E3/uL (ref 150–450)
RBC: 3.93 x10E6/uL — ABNORMAL LOW (ref 4.14–5.80)
RDW: 13.5 % (ref 11.6–15.4)
WBC: 9.1 x10E3/uL (ref 3.4–10.8)

## 2024-10-23 LAB — BMP8+EGFR
BUN/Creatinine Ratio: 8 — ABNORMAL LOW (ref 10–24)
BUN: 8 mg/dL (ref 8–27)
CO2: 20 mmol/L (ref 20–29)
Calcium: 9.1 mg/dL (ref 8.6–10.2)
Chloride: 99 mmol/L (ref 96–106)
Creatinine, Ser: 1 mg/dL (ref 0.76–1.27)
Glucose: 92 mg/dL (ref 70–99)
Potassium: 4.6 mmol/L (ref 3.5–5.2)
Sodium: 134 mmol/L (ref 134–144)
eGFR: 80 mL/min/1.73

## 2024-10-23 LAB — FERRITIN: Ferritin: 485 ng/mL — ABNORMAL HIGH (ref 30–400)

## 2024-10-23 LAB — IRON: Iron: 29 ug/dL — ABNORMAL LOW (ref 38–169)

## 2024-10-23 LAB — VITAMIN B12: Vitamin B-12: 933 pg/mL (ref 232–1245)

## 2024-10-24 ENCOUNTER — Ambulatory Visit: Payer: Self-pay | Admitting: Family Medicine

## 2024-10-25 ENCOUNTER — Emergency Department (HOSPITAL_COMMUNITY)

## 2024-10-25 ENCOUNTER — Encounter (HOSPITAL_COMMUNITY): Payer: Self-pay

## 2024-10-25 ENCOUNTER — Ambulatory Visit: Admission: EM | Admit: 2024-10-25 | Discharge: 2024-10-25 | Disposition: A | Discharge status: Home / Self Care

## 2024-10-25 ENCOUNTER — Other Ambulatory Visit: Payer: Self-pay

## 2024-10-25 ENCOUNTER — Inpatient Hospital Stay (HOSPITAL_COMMUNITY)
Admission: EM | Admit: 2024-10-25 | Discharge: 2024-10-29 | DRG: 175 | Disposition: A | Discharge status: Home Health | Source: Other Acute Inpatient Hospital | Attending: Family Medicine | Admitting: Family Medicine

## 2024-10-25 DIAGNOSIS — R0602 Shortness of breath: Secondary | ICD-10-CM | POA: Diagnosis not present

## 2024-10-25 DIAGNOSIS — G4700 Insomnia, unspecified: Secondary | ICD-10-CM | POA: Diagnosis present

## 2024-10-25 DIAGNOSIS — E785 Hyperlipidemia, unspecified: Secondary | ICD-10-CM | POA: Diagnosis present

## 2024-10-25 DIAGNOSIS — Z86711 Personal history of pulmonary embolism: Secondary | ICD-10-CM | POA: Diagnosis not present

## 2024-10-25 DIAGNOSIS — I82412 Acute embolism and thrombosis of left femoral vein: Secondary | ICD-10-CM | POA: Diagnosis present

## 2024-10-25 DIAGNOSIS — I5042 Chronic combined systolic (congestive) and diastolic (congestive) heart failure: Secondary | ICD-10-CM | POA: Diagnosis present

## 2024-10-25 DIAGNOSIS — I5021 Acute systolic (congestive) heart failure: Secondary | ICD-10-CM | POA: Insufficient documentation

## 2024-10-25 DIAGNOSIS — Z833 Family history of diabetes mellitus: Secondary | ICD-10-CM | POA: Diagnosis not present

## 2024-10-25 DIAGNOSIS — J9601 Acute respiratory failure with hypoxia: Secondary | ICD-10-CM | POA: Diagnosis present

## 2024-10-25 DIAGNOSIS — R531 Weakness: Secondary | ICD-10-CM | POA: Diagnosis not present

## 2024-10-25 DIAGNOSIS — F32A Depression, unspecified: Secondary | ICD-10-CM | POA: Diagnosis present

## 2024-10-25 DIAGNOSIS — Z7901 Long term (current) use of anticoagulants: Secondary | ICD-10-CM

## 2024-10-25 DIAGNOSIS — Z9049 Acquired absence of other specified parts of digestive tract: Secondary | ICD-10-CM

## 2024-10-25 DIAGNOSIS — B1920 Unspecified viral hepatitis C without hepatic coma: Secondary | ICD-10-CM | POA: Diagnosis present

## 2024-10-25 DIAGNOSIS — Z87891 Personal history of nicotine dependence: Secondary | ICD-10-CM

## 2024-10-25 DIAGNOSIS — E872 Acidosis, unspecified: Secondary | ICD-10-CM | POA: Diagnosis present

## 2024-10-25 DIAGNOSIS — Z800 Family history of malignant neoplasm of digestive organs: Secondary | ICD-10-CM

## 2024-10-25 DIAGNOSIS — I2602 Saddle embolus of pulmonary artery with acute cor pulmonale: Principal | ICD-10-CM

## 2024-10-25 DIAGNOSIS — I5181 Takotsubo syndrome: Secondary | ICD-10-CM | POA: Diagnosis present

## 2024-10-25 DIAGNOSIS — K7400 Hepatic fibrosis, unspecified: Secondary | ICD-10-CM | POA: Diagnosis present

## 2024-10-25 DIAGNOSIS — Z813 Family history of other psychoactive substance abuse and dependence: Secondary | ICD-10-CM

## 2024-10-25 DIAGNOSIS — R0689 Other abnormalities of breathing: Secondary | ICD-10-CM | POA: Diagnosis not present

## 2024-10-25 DIAGNOSIS — Z8249 Family history of ischemic heart disease and other diseases of the circulatory system: Secondary | ICD-10-CM

## 2024-10-25 DIAGNOSIS — Z7401 Bed confinement status: Secondary | ICD-10-CM | POA: Diagnosis not present

## 2024-10-25 DIAGNOSIS — I5022 Chronic systolic (congestive) heart failure: Secondary | ICD-10-CM | POA: Diagnosis not present

## 2024-10-25 DIAGNOSIS — Z1152 Encounter for screening for COVID-19: Secondary | ICD-10-CM

## 2024-10-25 DIAGNOSIS — G7281 Critical illness myopathy: Secondary | ICD-10-CM | POA: Diagnosis present

## 2024-10-25 DIAGNOSIS — E119 Type 2 diabetes mellitus without complications: Secondary | ICD-10-CM | POA: Diagnosis present

## 2024-10-25 DIAGNOSIS — R0609 Other forms of dyspnea: Secondary | ICD-10-CM | POA: Diagnosis not present

## 2024-10-25 DIAGNOSIS — I2609 Other pulmonary embolism with acute cor pulmonale: Principal | ICD-10-CM | POA: Diagnosis present

## 2024-10-25 DIAGNOSIS — I2699 Other pulmonary embolism without acute cor pulmonale: Secondary | ICD-10-CM | POA: Diagnosis not present

## 2024-10-25 DIAGNOSIS — Z8616 Personal history of COVID-19: Secondary | ICD-10-CM | POA: Diagnosis not present

## 2024-10-25 DIAGNOSIS — J189 Pneumonia, unspecified organism: Secondary | ICD-10-CM | POA: Diagnosis present

## 2024-10-25 DIAGNOSIS — I82431 Acute embolism and thrombosis of right popliteal vein: Secondary | ICD-10-CM | POA: Diagnosis present

## 2024-10-25 DIAGNOSIS — Z09 Encounter for follow-up examination after completed treatment for conditions other than malignant neoplasm: Secondary | ICD-10-CM | POA: Insufficient documentation

## 2024-10-25 DIAGNOSIS — Z79899 Other long term (current) drug therapy: Secondary | ICD-10-CM

## 2024-10-25 DIAGNOSIS — R2681 Unsteadiness on feet: Secondary | ICD-10-CM | POA: Insufficient documentation

## 2024-10-25 DIAGNOSIS — I110 Hypertensive heart disease with heart failure: Secondary | ICD-10-CM | POA: Diagnosis present

## 2024-10-25 DIAGNOSIS — K219 Gastro-esophageal reflux disease without esophagitis: Secondary | ICD-10-CM | POA: Diagnosis present

## 2024-10-25 DIAGNOSIS — G729 Myopathy, unspecified: Secondary | ICD-10-CM | POA: Diagnosis not present

## 2024-10-25 DIAGNOSIS — Z888 Allergy status to other drugs, medicaments and biological substances status: Secondary | ICD-10-CM

## 2024-10-25 DIAGNOSIS — R54 Age-related physical debility: Secondary | ICD-10-CM | POA: Diagnosis present

## 2024-10-25 DIAGNOSIS — D649 Anemia, unspecified: Secondary | ICD-10-CM | POA: Diagnosis present

## 2024-10-25 DIAGNOSIS — Z809 Family history of malignant neoplasm, unspecified: Secondary | ICD-10-CM

## 2024-10-25 DIAGNOSIS — D619 Aplastic anemia, unspecified: Secondary | ICD-10-CM | POA: Insufficient documentation

## 2024-10-25 DIAGNOSIS — I5020 Unspecified systolic (congestive) heart failure: Secondary | ICD-10-CM | POA: Diagnosis not present

## 2024-10-25 DIAGNOSIS — I493 Ventricular premature depolarization: Secondary | ICD-10-CM | POA: Diagnosis present

## 2024-10-25 DIAGNOSIS — R5381 Other malaise: Secondary | ICD-10-CM | POA: Diagnosis not present

## 2024-10-25 DIAGNOSIS — Z860100 Personal history of colon polyps, unspecified: Secondary | ICD-10-CM

## 2024-10-25 LAB — I-STAT CHEM 8, ED
BUN: 7 mg/dL — ABNORMAL LOW (ref 8–23)
Calcium, Ion: 1.23 mmol/L (ref 1.15–1.40)
Chloride: 99 mmol/L (ref 98–111)
Creatinine, Ser: 1.1 mg/dL (ref 0.61–1.24)
Glucose, Bld: 117 mg/dL — ABNORMAL HIGH (ref 70–99)
HCT: 38 % — ABNORMAL LOW (ref 39.0–52.0)
Hemoglobin: 12.9 g/dL — ABNORMAL LOW (ref 13.0–17.0)
Potassium: 4 mmol/L (ref 3.5–5.1)
Sodium: 136 mmol/L (ref 135–145)
TCO2: 24 mmol/L (ref 22–32)

## 2024-10-25 LAB — CBC WITH DIFFERENTIAL/PLATELET
Abs Immature Granulocytes: 0.43 K/uL — ABNORMAL HIGH (ref 0.00–0.07)
Basophils Absolute: 0.1 K/uL (ref 0.0–0.1)
Basophils Relative: 1 %
Eosinophils Absolute: 0.1 K/uL (ref 0.0–0.5)
Eosinophils Relative: 1 %
HCT: 32.6 % — ABNORMAL LOW (ref 39.0–52.0)
Hemoglobin: 10.6 g/dL — ABNORMAL LOW (ref 13.0–17.0)
Immature Granulocytes: 4 %
Lymphocytes Relative: 11 %
Lymphs Abs: 1.3 K/uL (ref 0.7–4.0)
MCH: 27.9 pg (ref 26.0–34.0)
MCHC: 32.5 g/dL (ref 30.0–36.0)
MCV: 85.8 fL (ref 80.0–100.0)
Monocytes Absolute: 1.1 K/uL — ABNORMAL HIGH (ref 0.1–1.0)
Monocytes Relative: 9 %
Neutro Abs: 8.8 K/uL — ABNORMAL HIGH (ref 1.7–7.7)
Neutrophils Relative %: 74 %
Platelets: 258 K/uL (ref 150–400)
RBC: 3.8 MIL/uL — ABNORMAL LOW (ref 4.22–5.81)
RDW: 14.5 % (ref 11.5–15.5)
WBC: 11.8 K/uL — ABNORMAL HIGH (ref 4.0–10.5)
nRBC: 0 % (ref 0.0–0.2)

## 2024-10-25 LAB — TROPONIN T, HIGH SENSITIVITY
Troponin T High Sensitivity: 77 ng/L — ABNORMAL HIGH (ref 0–19)
Troponin T High Sensitivity: 77 ng/L — ABNORMAL HIGH (ref 0–19)

## 2024-10-25 LAB — RESP PANEL BY RT-PCR (RSV, FLU A&B, COVID)  RVPGX2
Influenza A by PCR: NEGATIVE
Influenza B by PCR: NEGATIVE
Resp Syncytial Virus by PCR: NEGATIVE
SARS Coronavirus 2 by RT PCR: NEGATIVE

## 2024-10-25 LAB — BLOOD GAS, VENOUS
Acid-Base Excess: 3.2 mmol/L — ABNORMAL HIGH (ref 0.0–2.0)
Bicarbonate: 27.9 mmol/L (ref 20.0–28.0)
Drawn by: 442
O2 Saturation: 29.9 %
Patient temperature: 36.8
pCO2, Ven: 42 mmHg — ABNORMAL LOW (ref 44–60)
pH, Ven: 7.43 (ref 7.25–7.43)
pO2, Ven: <31 mmHg — CL (ref 32–45)

## 2024-10-25 LAB — COMPREHENSIVE METABOLIC PANEL WITH GFR
ALT: 55 U/L — ABNORMAL HIGH (ref 0–44)
AST: 25 U/L (ref 15–41)
Albumin: 2.9 g/dL — ABNORMAL LOW (ref 3.5–5.0)
Alkaline Phosphatase: 81 U/L (ref 38–126)
Anion gap: 12 (ref 5–15)
BUN: 8 mg/dL (ref 8–23)
CO2: 23 mmol/L (ref 22–32)
Calcium: 9.1 mg/dL (ref 8.9–10.3)
Chloride: 100 mmol/L (ref 98–111)
Creatinine, Ser: 0.9 mg/dL (ref 0.61–1.24)
GFR, Estimated: >60 mL/min
Glucose, Bld: 113 mg/dL — ABNORMAL HIGH (ref 70–99)
Potassium: 4 mmol/L (ref 3.5–5.1)
Sodium: 134 mmol/L — ABNORMAL LOW (ref 135–145)
Total Bilirubin: 0.4 mg/dL (ref 0.0–1.2)
Total Protein: 7.3 g/dL (ref 6.5–8.1)

## 2024-10-25 LAB — APTT: aPTT: 36 s (ref 24–36)

## 2024-10-25 LAB — LACTIC ACID, PLASMA: Lactic Acid, Venous: 1.3 mmol/L (ref 0.5–1.9)

## 2024-10-25 LAB — PRO BRAIN NATRIURETIC PEPTIDE: Pro Brain Natriuretic Peptide: 2291 pg/mL — ABNORMAL HIGH

## 2024-10-25 LAB — PROTIME-INR
INR: 1.2 (ref 0.8–1.2)
Prothrombin Time: 15.8 s — ABNORMAL HIGH (ref 11.4–15.2)

## 2024-10-25 LAB — D-DIMER, QUANTITATIVE: D-Dimer, Quant: >20 ug{FEU}/mL — ABNORMAL HIGH (ref 0.00–0.50)

## 2024-10-25 LAB — GLUCOSE, CAPILLARY: Glucose-Capillary: 81 mg/dL (ref 70–99)

## 2024-10-25 MED ORDER — SODIUM CHLORIDE 0.9 % IV SOLN
500.0000 mg | Freq: Once | INTRAVENOUS | Status: AC
  Administered 2024-10-25: 500 mg via INTRAVENOUS
  Filled 2024-10-25: qty 5

## 2024-10-25 MED ORDER — SODIUM CHLORIDE 0.9 % IV SOLN
2.0000 g | Freq: Once | INTRAVENOUS | Status: AC
  Administered 2024-10-25: 2 g via INTRAVENOUS
  Filled 2024-10-25: qty 12.5

## 2024-10-25 MED ORDER — IPRATROPIUM-ALBUTEROL 0.5-2.5 (3) MG/3ML IN SOLN
3.0000 mL | Freq: Once | RESPIRATORY_TRACT | Status: AC
  Administered 2024-10-25: 3 mL via RESPIRATORY_TRACT
  Filled 2024-10-25: qty 3

## 2024-10-25 MED ORDER — VANCOMYCIN HCL IN DEXTROSE 1-5 GM/200ML-% IV SOLN
1000.0000 mg | Freq: Two times a day (BID) | INTRAVENOUS | Status: DC
  Administered 2024-10-26: 1000 mg via INTRAVENOUS
  Filled 2024-10-25: qty 200

## 2024-10-25 MED ORDER — IOHEXOL 350 MG/ML SOLN
75.0000 mL | Freq: Once | INTRAVENOUS | Status: AC | PRN
  Administered 2024-10-25: 75 mL via INTRAVENOUS

## 2024-10-25 MED ORDER — GUAIFENESIN ER 600 MG PO TB12
600.0000 mg | ORAL_TABLET | Freq: Two times a day (BID) | ORAL | Status: DC | PRN
  Administered 2024-10-26 – 2024-10-28 (×4): 600 mg via ORAL
  Filled 2024-10-25 (×4): qty 1

## 2024-10-25 MED ORDER — HEPARIN BOLUS VIA INFUSION
4700.0000 [IU] | Freq: Once | INTRAVENOUS | Status: AC
  Administered 2024-10-25: 4700 [IU] via INTRAVENOUS

## 2024-10-25 MED ORDER — HEPARIN (PORCINE) 25000 UT/250ML-% IV SOLN
1650.0000 [IU]/h | INTRAVENOUS | Status: DC
  Administered 2024-10-25: 1200 [IU]/h via INTRAVENOUS
  Administered 2024-10-26: 1650 [IU]/h via INTRAVENOUS
  Administered 2024-10-26: 1400 [IU]/h via INTRAVENOUS
  Administered 2024-10-27: 1650 [IU]/h via INTRAVENOUS
  Filled 2024-10-25 (×4): qty 250

## 2024-10-25 MED ORDER — SODIUM CHLORIDE 0.9 % IV SOLN
2.0000 g | Freq: Three times a day (TID) | INTRAVENOUS | Status: DC
  Administered 2024-10-25 – 2024-10-26 (×2): 2 g via INTRAVENOUS
  Filled 2024-10-25 (×2): qty 12.5

## 2024-10-25 MED ADMIN — Vancomycin HCl IV Soln 1500 MG/300ML (Base Equivalent): 1500 mg | INTRAVENOUS | NDC 70594004301

## 2024-10-25 MED FILL — Vancomycin HCl IV Soln 1500 MG/300ML (Base Equivalent): 1500.0000 mg | INTRAVENOUS | Qty: 300 | Status: AC

## 2024-10-25 NOTE — Progress Notes (Signed)
 Patient transported from ED to CT and back to ED without any complications

## 2024-10-25 NOTE — Progress Notes (Signed)
 Pharmacy Antibiotic Note  Bryce Martinez is a 73 y.o. male admitted on 10/25/2024 with pneumonia.  Pharmacy has been consulted for Vancomycin  dosing.  Plan: Vancomycin  1500mg  IV loading dose, then 1000 mg IV Q 12 hrs. Goal AUC 400-550. Expected AUC: 517 SCr used: 0.9 F/u cxs and clinical progress Monitor V/S labs and levels as indicated   Height: 6' 1 (185.4 cm) Weight: 73 kg (161 lb) IBW/kg (Calculated) : 79.9  Temp (24hrs), Avg:97.8 F (36.6 C), Min:97.8 F (36.6 C), Max:97.8 F (36.6 C)  Recent Labs  Lab 10/22/24 0857 10/25/24 1527  WBC 9.1 11.8*  CREATININE 1.00 0.90    Estimated Creatinine Clearance: 76.6 mL/min (by C-G formula based on SCr of 0.9 mg/dL).    Allergies[1]  Antimicrobials this admission: vancomycin  12/22 >>  Cefepime  12/22 in ED Azithromycin  12/22 in ED   Microbiology results: 12/22 BCx: pending   MRSA PCR:   Thank you for allowing pharmacy to be a part of this patients care.  Briannah Lona, BS Pharm D, BCPS Clinical Pharmacist 10/25/2024 4:31 PM     [1]  Allergies Allergen Reactions   Amlodipine  Other (See Comments)    unknown

## 2024-10-25 NOTE — Assessment & Plan Note (Signed)
 High fall risk , twice weekly PT

## 2024-10-25 NOTE — Consult Note (Signed)
 Pharmacy Consult Note - Anticoagulation  Pharmacy Consult for heparin  infusion Indication: pulmonary embolus Allergies[1]  PATIENT MEASUREMENTS: Height: 6' 1 (185.4 cm) Weight: 73 kg (161 lb) IBW/kg (Calculated) : 79.9 HEPARIN  DW (KG): 73  VITAL SIGNS: Temp: 97.8 F (36.6 C) (12/22 1428) Temp Source: Oral (12/22 1428) BP: 116/83 (12/22 1700) Pulse Rate: 93 (12/22 1700)  Recent Labs    10/25/24 1527 10/25/24 1529  HGB 10.6* 12.9*  HCT 32.6* 38.0*  PLT 258  --   CREATININE 0.90 1.10    Estimated Creatinine Clearance: 62.7 mL/min (by C-G formula based on SCr of 1.1 mg/dL).  PAST MEDICAL HISTORY: Past Medical History:  Diagnosis Date   Allergy    Arthritis    ED (erectile dysfunction)    GERD (gastroesophageal reflux disease)    Occasionally   Hepatitis C    Hyperlipidemia    Hypertension     Medications:  (Not in a hospital admission)  Scheduled:   heparin   4,700 Units Intravenous Once   Infusions:   heparin      vancomycin  1,500 mg (10/25/24 1734)   Followed by   NOREEN ON 10/26/2024] vancomycin        ASSESSMENT: 73 y.o. male with PMH HTN, CHF, HLD and recent ARDS is presenting with PE. Patient is not on chronic anticoagulation per chart review. Pharmacy has been consulted to initiate and manage heparin  intravenous infusion.   Goal(s) of therapy: Heparin  level 0.3 - 0.7 units/mL aPTT 66 - 102 seconds Monitor platelets by anticoagulation protocol: Yes   Baseline anticoagulation labs: Recent Labs    10/25/24 1527 10/25/24 1529  HGB 10.6* 12.9*  PLT 258  --     PLAN:  Give 4,700 units bolus x1; then start heparin  infusion at 1,200 units/hour.  Check heparin  level in 8 hours, then daily once at least two levels are consecutively therapeutic.  Monitor CBC daily while on heparin  infusion.   Annabella LOISE Banks, PharmD Clinical Pharmacist 10/25/2024 5:58 PM     [1]  Allergies Allergen Reactions   Amlodipine  Other (See Comments)     unknown

## 2024-10-25 NOTE — ED Notes (Signed)
 Called and gave report to charge RN at [facility] ED.

## 2024-10-25 NOTE — ED Triage Notes (Signed)
 Pt came with SOB that stared yesterday. Pt was having difficulty talking and very SOB. Oxygen level was 64 on room air. Started oxygen at 2L then went up to 4L, oxygen came up to 94 after 5 min.    EMS called.

## 2024-10-25 NOTE — H&P (Signed)
 "  NAME:  Bryce Martinez, MRN:  [id number], DOB:  May 14, 1951, LOS: 0 ADMISSION DATE:  10/25/2024, CONSULTATION DATE:  10/25/2024 REFERRING MD:  [facility]  CHIEF COMPLAINT:  Dyspnea   History of Present Illness:  73 year old man who presented to [facility] 12/22 as a transfer from [facility] for dyspnea secondary to PE. 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 Recent admission to [facility] 11/24-12/9 for ARDS secondary to aspiration pneumonia, requiring mechanical ventilation 11/24-12/1.  During that admission, found to have systolic CHF with Echo revealing LVEF 25 to 30% and global hypokinesis; also incidentally found to be COVID-positive. Due to generalized deconditioning/critical illness myopathy, he was discharged to CIR on 12/9 and remained admitted there through 12/15.  On 12/22, patient presented to urgent care with dyspnea, primarily with exertion. Found to have O2 sats of 64% on RA  Patient had recently seen his PCP 12/19 and was diagnosed with a LLL PNA for which was started on a Z-Pak.  Due to his significant hypoxia, he was referred to [facility] ED for further evaluation and management.  In the ED, patient was noted to be hypoxic and was placed on 4L nasal cannula before being upgraded to BiPAP.  ProBNP noted to be elevated at 2291, troponin flat at 77, D-dimer > 20.  CXR demonstrated left lung consolidation and small right pleural effusion.  CTA Chest demonstrated moderate volume occlusive and non-occlusive PE throughout the right lung with some concern for RHS, GGOs opacity RUL, RML, RLL with dense consolidation on left.  In the setting of need for BiPAP with new PE, PCCM was consulted for ICU admission and transfer to [facility].  Pertinent Medical History:   Past Medical History:  Diagnosis Date   Allergy    Arthritis    ED (erectile dysfunction)    GERD (gastroesophageal reflux disease)    Occasionally   Hepatitis C     Hyperlipidemia    Hypertension    Significant Hospital Events: Including procedures, antibiotic start and stop dates in addition to other pertinent events   12/22 Admit  Interim History / Subjective:  PCCM consulted for ICU admission and transfer.  Objective:  Blood pressure 121/74, pulse 79, temperature 98.6 F (37 C), temperature source Axillary, resp. rate (!) 26, height 6' 1 (1.854 m), weight 73 kg, SpO2 99%.    FiO2 (%):  [40 %] 40 % PEEP:  [5 cmH20-6 cmH20] 6 cmH20   Intake/Output Summary (Last 24 hours) at 10/25/2024 2206 Last data filed at 10/25/2024 1934 Gross per 24 hour  Intake 584.13 ml  Output --  Net 584.13 ml   Filed Weights   10/25/24 1429  Weight: 73 kg   Physical Examination: General: Acutely ill-appearing older man in NAD. HEENT: [place]/AT, anicteric sclera, PERRL, moist mucous membranes. Neuro: Awake, oriented x 4. Nodding appropriately to questions. Responds to verbal stimuli. Following commands consistently. Moves all 4 extremities spontaneously.  CV: RRR, no m/g/r. PULM: Breathing tachypneic to high 20s-low 30s and mildly labored on BiPAP. Lung fields coarse in lower fields. GI: Soft, nontender, nondistended. Normoactive bowel sounds. Extremities: No LE edema noted. Skin: Warm/dry, no rashes.  Labs/imaging personally reviewed:  CTA Chest 12/22 > CTA chest demonstrated moderate volume occlusive and non-occlusive PE throughout the right lung with some concern for RHS, GGOs opacity RUL, RML, RLL with dense consolidation on left Echo 12/23 >   Assessment & Plan:   Acute hypoxic respiratory failure  Acute submassive pulmonary embolism LLL PNA - concerning for HCAP vs recurrent aspiration Trop 77 > 77, pBNP 2291. CTA demonstrating moderate volume occlusive/nonocclusive PE throughout the R lung with evidence of R heart strain. Management options including conservative management, lytic administration and thrombectomy discussed with decision for conservative  management at present. PESI Score 92 points (Class III, Intermediate Risk) - BiPAP, continuous vs PRN - Trial HHFNC, return to BiPAP if unable to tolerate - Supplemental O2 support to maintain SpO2 > 90% - Trend WBC, fever curve - Continue broad-spectrum antibiotics (vanc/cefepime ) - Therapeutic anticoagulation with heparin  gtt - F/u Echo - F/u LE Dopplers - Eventual transition to PO anticoagulation once nearing discharge  Hx HTN, HLD, HFrEF Echo 09/28/24 with LVEF 25-30%, global HK, normal RV. - Hold Losartan  - Consider Cardiology consult for new patient establishment   Chronic anemia - Trend H&H - Monitor for signs of active bleeding - Transfuse for Hgb < 7.0 or hemodynamically significant bleeding   DMT2 - SSI - CBGs Q4H - Goal CBG 140-180  GERD - PPI    Deconditioning, recent critical illness myopathy - PT/OT/SLP as clinically appropriate  Labs   CBC: Recent Labs  Lab 10/22/24 0857 10/25/24 1527 10/25/24 1529  WBC 9.1 11.8*  --   NEUTROABS 6.6 8.8*  --   HGB 11.0* 10.6* 12.9*  HCT 34.0* 32.6* 38.0*  MCV 87 85.8  --   PLT 291 258  --    Basic Metabolic Panel: Recent Labs  Lab 10/22/24 0857 10/25/24 1527 10/25/24 1529  NA 134 134* 136  K 4.6 4.0 4.0  CL 99 100 99  CO2 20 23  --   GLUCOSE 92 113* 117*  BUN 8 8 7*  CREATININE 1.00 0.90 1.10  CALCIUM  9.1 9.1  --    GFR: Estimated Creatinine Clearance: 62.7 mL/min (by C-G formula based on SCr of 1.1 mg/dL). Recent Labs  Lab 10/22/24 0857 10/25/24 1527 10/25/24 1915  WBC 9.1 11.8*  --   LATICACIDVEN  --   --  1.3   Liver Function Tests: Recent Labs  Lab 10/25/24 1527  AST 25  ALT 55*  ALKPHOS 81  BILITOT 0.4  PROT 7.3  ALBUMIN 2.9*   No results for input(s): LIPASE, AMYLASE in the last 168 hours. No results for input(s): AMMONIA in the last 168 hours.  ABG:    Component Value Date/Time   PHART 7.474 (H) 10/01/2024 0417   PCO2ART 38.3 10/01/2024 0417   PO2ART 84 10/01/2024  0417   HCO3 27.9 10/25/2024 1527   TCO2 24 10/25/2024 1529   ACIDBASEDEF 4.0 (H) 09/29/2024 0306   O2SAT 29.9 10/25/2024 1527   Coagulation Profile: Recent Labs  Lab 10/25/24 1527  INR 1.2   Cardiac Enzymes: No results for input(s): CKTOTAL, CKMB, CKMBINDEX, TROPONINI in the last 168 hours.  HbA1C: Hgb A1c MFr Bld  Date/Time Value Ref Range Status  09/16/2017 09:58 AM 5.5 4.8 - 5.6 % Final    Comment:             Prediabetes: 5.7 - 6.4          Diabetes: >6.4          Glycemic control for adults with diabetes: <7.0   05/20/2015 10:10 AM 5.8 (H) <5.7 % Final    Comment:  According to the ADA Clinical Practice Recommendations for 2011, when HbA1c is used as a screening test:     >=6.5%   Diagnostic of Diabetes Mellitus            (if abnormal result is confirmed)   5.7-6.4%   Increased risk of developing Diabetes Mellitus   References:Diagnosis and Classification of Diabetes Mellitus,Diabetes Care,2011,34(Suppl 1):S62-S69 and Standards of Medical Care in         Diabetes - 2011,Diabetes Care,2011,34 (Suppl 1):S11-S61.      CBG: Recent Labs  Lab 10/25/24 2101  GLUCAP 81    Review of Systems:   Review of systems completed with pertinent positives/negatives outlined in above HPI.  Past Medical History:  He,  has a past medical history of Allergy, Arthritis, ED (erectile dysfunction), GERD (gastroesophageal reflux disease), Hepatitis C, Hyperlipidemia, and Hypertension.   Surgical History:   Past Surgical History:  Procedure Laterality Date   APPENDECTOMY     CHOLECYSTECTOMY N/A 04/25/2014   Procedure: LAPAROSCOPIC CHOLECYSTECTOMY;  Surgeon: Oneil DELENA Budge, MD;  Location: [facility];  Service: General;  Laterality: N/A;   COLONOSCOPY N/A 09/01/2019   Procedure: COLONOSCOPY;  Surgeon: Golda Claudis PENNER, MD;  Location: [facility];  Service: Endoscopy;  Laterality: N/A;  930    COLONOSCOPY N/A 09/27/2024   Procedure: COLONOSCOPY;  Surgeon: Eartha Angelia Sieving, MD;  Location: [facility];  Service: Gastroenterology;  Laterality: N/A;  7:30am, ASA 1-2   HEMICOLECTOMY Right    lap hand assisted   POLYPECTOMY  09/01/2019   Procedure: POLYPECTOMY;  Surgeon: Golda Claudis PENNER, MD;  Location: [facility];  Service: Endoscopy;;  colon   right shoulder surgery Right 03/2005   rotator cuff repair   Social History:   reports that he quit smoking about 52 years ago. His smoking use included cigarettes. He started smoking about 67 years ago. He has a 4.4 pack-year smoking history. He has been exposed to tobacco smoke. He has never used smokeless tobacco. He reports that he does not currently use alcohol. He reports that he does not use drugs.   Family History:  His family history includes Cancer in his father; Diabetes in his brother; Drug abuse in his brother; Hypertension in his brother, daughter, mother, sister, and son.   Allergies Allergies[1]   Home Medications  Prior to Admission medications  Medication Sig Start Date End Date Taking? Authorizing Provider  acetaminophen  (TYLENOL ) 325 MG tablet Take 1-2 tablets (325-650 mg total) by mouth every 4 (four) hours as needed for mild pain (pain score 1-3). 10/18/24   Jerilynn Daphne SAILOR, NP  lidocaine  (LIDODERM ) 5 % Place 1 patch onto the skin daily. Remove & Discard patch within 12 hours or as directed by MD 10/19/24   Jerilynn Daphne SAILOR, NP  losartan  (COZAAR ) 100 MG tablet TAKE 1 TABLET BY MOUTH EVERY DAY 09/28/24   Antonetta Rollene BRAVO, MD  pantoprazole  (PROTONIX ) 40 MG tablet Take 1 tablet (40 mg total) by mouth daily before breakfast. 10/19/24   Jerilynn Daphne SAILOR, NP  polyethylene glycol (MIRALAX  / GLYCOLAX ) 17 g packet Take 17 g by mouth daily. 10/19/24   Jerilynn Daphne SAILOR, NP  senna-docusate (SENOKOT-S) 8.6-50 MG tablet Take 2 tablets by mouth 2 (two) times daily. 10/18/24   Jerilynn Daphne SAILOR, NP  sodium  chloride 1 g tablet Take 1 tablet (1 g total) by mouth daily for 7 days. 10/18/24 10/25/24  Jerilynn Daphne SAILOR, NP  traZODone  (DESYREL ) 50 MG tablet Take 1  tablet (50 mg total) by mouth at bedtime. 10/18/24   Jerilynn Daphne SAILOR, NP   Critical care time:   The patient is critically ill with multiple organ system failure and requires high complexity decision making for assessment and support, frequent evaluation and titration of therapies, advanced monitoring, review of radiographic studies and interpretation of complex data.   Critical Care Time devoted to patient care services, exclusive of separately billable procedures, described in this note is 36 minutes.  Corean CHRISTELLA Brittnay Pigman, PA-C [facility] 10/25/2024 10:06 PM  Please see [email or web address] for pager details.  From 7A-7P if no response, please call [phone] After hours, please call ELink [phone]    [1]  Allergies Allergen Reactions   Amlodipine  Other (See Comments)    Swelling of feet   "

## 2024-10-25 NOTE — Assessment & Plan Note (Signed)
 Rept cxr in 4 weks and pulmonary fol,low up due to severity of illness

## 2024-10-25 NOTE — Discharge Instructions (Signed)
 We are transporting you to the emergency department via EMS due to your dangerously low oxygen levels and severity of symptoms.

## 2024-10-25 NOTE — ED Notes (Addendum)
 Patient is being discharged from the Urgent Care and sent to the Emergency Department via EMS . Per Vernell Hammersmith PA-C, patient is in need of higher level of care due to Low oxygen levels, SOB. Patient is aware and verbalizes understanding of plan of care. There were no vitals filed for this visit.

## 2024-10-25 NOTE — Assessment & Plan Note (Signed)
 Cardiology evaluation and management asap

## 2024-10-25 NOTE — Assessment & Plan Note (Signed)
 Pt advised to take only losartan  does not need spironolactone 

## 2024-10-25 NOTE — Progress Notes (Addendum)
 eLink Physician-Brief Progress Note Patient Name: Bryce Martinez DOB: Aug 01, 1951 MRN: [id number]   Date of Service  10/25/2024  HPI/Events of Note  73 year old with a history of metabolic syndrome, heart failure with reduced EF, hepatitis C induced fibrosis, NOA who was recently admitted for ARDS who returns to the emergency department with dyspnea on exertion and hypoxemia after failed outpatient therapy for left lower lobe pneumonia found to have an acute submassive PE.  Patient's vitals, labs, and imaging reviewed  eICU Interventions  Maintain heparin  infusion Empiric antibiotics for pneumonia BiPAP for support  DVT prophylaxis with therapeutic heparin  GI prophylaxis not indicated   2357 -patient tolerating being off BiPAP, requesting home Robitussin, maintain n.p.o., okay for meds if he passes bedside swallow  Intervention Category Evaluation Type: New Patient Evaluation  Sherrol Vicars 10/25/2024, 9:47 PM

## 2024-10-25 NOTE — Assessment & Plan Note (Signed)
 Still has significant generalized muscle weakness with poor exercise tolerance, needs and will benefit from in home PT twice weekly for 8 weeks  Referral sent urgently as he will not get therapy until next 2 weeks if not approved form his medicare

## 2024-10-25 NOTE — ED Provider Notes (Signed)
 " [facility]    CSN: [id number] Arrival date & time: 10/25/24  1340      History   Chief Complaint Chief Complaint  Patient presents with   Shortness of Breath    HPI Bryce Martinez is a 73 y.o. male.   Patient presenting today with wife for evaluation of new onset severe shortness of breath, weakness, feeling like he is going to pass out that started yesterday evening.  Per primary care note from 10/22/2024, he was just released from the hospital where he was admitted from 10/12/2024 to 10/18/2024 for critical illness myelopathy secondary to ARDS and aspiration pneumonia from colonoscopy.  Do not have any hospital records to review but per this PCP note most recent chest x-ray showed a possible new left lower lobe pneumonia for which she completed a Z-Pak and started to feel a bit better.  Was also newly diagnosed with systolic heart failure per this note and awaiting cardiology evaluation and follow-up.  He states he did feel somewhat better after completing the antibiotics but symptoms returned severely yesterday and he was wondering if he needed another antibiotic.  He denies notice of fever, vomiting, diarrhea, chest pain, leg swelling, orthopnea, mental status changes, numbness or tingling of extremities.  Past medical history significant for hyperlipidemia, hypertension, and the aforementioned diagnoses from hospitalization last week.    Past Medical History:  Diagnosis Date   Allergy    Arthritis    ED (erectile dysfunction)    GERD (gastroesophageal reflux disease)    Occasionally   Hepatitis C    Hyperlipidemia    Hypertension     Patient Active Problem List   Diagnosis Date Noted   Hospital discharge follow-up 10/25/2024   Aplastic anemia 10/25/2024   Unsteady gait 10/25/2024   Acute systolic heart failure (HCC) 10/25/2024   LLL pneumonia 10/22/2024   Adjustment disorder with mixed anxiety and depressed mood 10/15/2024   Malnutrition of moderate  degree 10/14/2024   Critical illness myopathy 10/12/2024   ARDS (adult respiratory distress syndrome) (HCC) 09/27/2024   Acute hypoxic respiratory failure (HCC) 09/27/2024   Vertigo 08/24/2024   Encounter for Medicare annual examination with abnormal findings 05/21/2024   Hematuria 05/21/2024   Vitamin D  deficiency 10/30/2023   Tubular adenoma of colon 05/20/2023   Light headedness 05/20/2023   Carotid bruit 05/20/2023   Mild epistaxis 11/25/2022   Sinusitis 10/09/2022   Hepatic fibrosis 11/06/2021   History of colonic polyps 11/06/2021   Ganglion cyst of finger 09/21/2021   Hypercalcemia 08/06/2021   Family history of colon cancer 06/28/2019   Hearing loss 06/03/2018   Spinal stenosis of lumbar region 09/03/2016   History of hepatitis C 07/26/2016   Right-sided low back pain with right-sided sciatica 07/26/2016   Neck pain 10/10/2014   Hepatitis C antibody test positive 10/10/2014   Allergic rhinitis 07/11/2011   ED (erectile dysfunction) 07/11/2011   Dyslipidemia 01/13/2008   Essential hypertension 01/13/2008    Past Surgical History:  Procedure Laterality Date   APPENDECTOMY     CHOLECYSTECTOMY N/A 04/25/2014   Procedure: LAPAROSCOPIC CHOLECYSTECTOMY;  Surgeon: Oneil DELENA Budge, MD;  Location: [facility];  Service: General;  Laterality: N/A;   COLONOSCOPY N/A 09/01/2019   Procedure: COLONOSCOPY;  Surgeon: Golda Claudis PENNER, MD;  Location: [facility];  Service: Endoscopy;  Laterality: N/A;  930   COLONOSCOPY N/A 09/27/2024   Procedure: COLONOSCOPY;  Surgeon: Eartha Angelia Sieving, MD;  Location: [facility];  Service: Gastroenterology;  Laterality:  N/A;  7:30am, ASA 1-2   HEMICOLECTOMY Right    lap hand assisted   POLYPECTOMY  09/01/2019   Procedure: POLYPECTOMY;  Surgeon: Golda Claudis PENNER, MD;  Location: [facility];  Service: Endoscopy;;  colon   right shoulder surgery Right 03/2005   rotator cuff repair       Home Medications    Prior to Admission  medications  Medication Sig Start Date End Date Taking? Authorizing Provider  acetaminophen  (TYLENOL ) 325 MG tablet Take 1-2 tablets (325-650 mg total) by mouth every 4 (four) hours as needed for mild pain (pain score 1-3). 10/18/24   Jerilynn Daphne SAILOR, NP  lidocaine  (LIDODERM ) 5 % Place 1 patch onto the skin daily. Remove & Discard patch within 12 hours or as directed by MD 10/19/24   Jerilynn Daphne SAILOR, NP  losartan  (COZAAR ) 100 MG tablet TAKE 1 TABLET BY MOUTH EVERY DAY 09/28/24   Antonetta Rollene BRAVO, MD  pantoprazole  (PROTONIX ) 40 MG tablet Take 1 tablet (40 mg total) by mouth daily before breakfast. 10/19/24   Jerilynn Daphne SAILOR, NP  polyethylene glycol (MIRALAX  / GLYCOLAX ) 17 g packet Take 17 g by mouth daily. 10/19/24   Jerilynn Daphne SAILOR, NP  senna-docusate (SENOKOT-S) 8.6-50 MG tablet Take 2 tablets by mouth 2 (two) times daily. 10/18/24   Jerilynn Daphne SAILOR, NP  sodium chloride  1 g tablet Take 1 tablet (1 g total) by mouth daily for 7 days. 10/18/24 10/25/24  Jerilynn Daphne SAILOR, NP  traZODone  (DESYREL ) 50 MG tablet Take 1 tablet (50 mg total) by mouth at bedtime. 10/18/24   Jerilynn Daphne SAILOR, NP    Family History Family History  Problem Relation Age of Onset   Hypertension Mother    Cancer Father        colon    Hypertension Sister    Hypertension Brother    Diabetes Brother    Drug abuse Brother    Hypertension Daughter    Hypertension Son     Social History Social History[1]   Allergies   Amlodipine    Review of Systems Review of Systems Per HPI  Physical Exam Triage Vital Signs ED Triage Vitals  Encounter Vitals Group     BP      Girls Systolic BP Percentile      Girls Diastolic BP Percentile      Boys Systolic BP Percentile      Boys Diastolic BP Percentile      Pulse      Resp      Temp      Temp src      SpO2      Weight      Height      Head Circumference      Peak Flow      Pain Score      Pain Loc      Pain Education      Exclude from  Growth Chart    No data found.  Updated Vital Signs BP 124/63 (BP Location: Left Arm)   Pulse (!) 101   Resp (!) 22   SpO2 96%   Visual Acuity Right Eye Distance:   Left Eye Distance:   Bilateral Distance:    Right Eye Near:   Left Eye Near:    Bilateral Near:     Physical Exam Vitals and nursing note reviewed.  Constitutional:      Appearance: He is ill-appearing.     Comments: Appears weak  HENT:  Head: Atraumatic.     Mouth/Throat:     Mouth: Mucous membranes are moist.  Eyes:     Extraocular Movements: Extraocular movements intact.     Conjunctiva/sclera: Conjunctivae normal.  Cardiovascular:     Rate and Rhythm: Tachycardia present.  Pulmonary:     Effort: Pulmonary effort is normal. No respiratory distress.     Breath sounds: No stridor.     Comments: Currently on 4 L nasal cannula, able to speak in full sentences on this supplementation Musculoskeletal:     Cervical back: Normal range of motion and neck supple.     Comments: Currently in wheelchair, unclear baseline ambulatory status.  Appears weak  Skin:    General: Skin is warm and dry.  Neurological:     Mental Status: He is alert and oriented to person, place, and time.  Psychiatric:        Mood and Affect: Mood normal.        Thought Content: Thought content normal.        Judgment: Judgment normal.      UC Treatments / Results  Labs (all labs ordered are listed, but only abnormal results are displayed) Labs Reviewed - No data to display  EKG   Radiology No results found.  Procedures Procedures (including critical care time)  Medications Ordered in UC Medications - No data to display  Initial Impression / Assessment and Plan / UC Course  I have reviewed the triage vital signs and the nursing notes.  Pertinent labs & imaging results that were available during my care of the patient were reviewed by me and considered in my medical decision making (see chart for details).      Tachycardic, tachypneic and severely hypoxic upon presentation today at 64% on room air.  He ultimately was able to achieve 91% on 4 L nasal cannula and was stable on this until arrival of EMS for transport to the emergency department.  Given his very recent hospitalization for severe illness to include ARDS/aspiration pneumonia discussed with patient the importance of availability of more resources and longer duration monitoring to better serve his current symptoms, he is agreeable to transport to the emergency department via EMS.  Peripheral IV was started prior to transport.  Final Clinical Impressions(s) / UC Diagnoses   Final diagnoses:  Acute respiratory failure with hypoxia (HCC)  Weakness  SOB (shortness of breath)     Discharge Instructions      We are transporting you to the emergency department via EMS due to your dangerously low oxygen levels and severity of symptoms.    ED Prescriptions   None    PDMP not reviewed this encounter.    [1]  Social History Tobacco Use   Smoking status: Former    Current packs/day: 0.00    Average packs/day: 0.3 packs/day for 17.3 years (4.4 ttl pk-yrs)    Types: Cigarettes    Start date: 04/19/1957    Quit date: 04/19/1972    Years since quitting: 52.5    Passive exposure: Past   Smokeless tobacco: Never  Vaping Use   Vaping status: Never Used  Substance Use Topics   Alcohol use: Not Currently    Comment: special occasions   Drug use: No     Stuart Vernell Norris, PA-C 10/25/24 1354  "

## 2024-10-25 NOTE — ED Triage Notes (Signed)
 Pt BIB [facility] from urgent care with complaints of SOB. Urgent care states patient's o2 saturation was in the 60s on RA. Patient is alert and oriented. Pt states he was in the hospital intubated and had aspiration pneumonia recently. Pt was sent home on antibiotics and completed the full course.

## 2024-10-25 NOTE — ED Provider Notes (Signed)
 " [place] EMERGENCY DEPARTMENT AT [facility] Provider Note  CSN: [id number] Arrival date & time: 10/25/24 1416  Chief Complaint(s) Shortness of Breath  HPI Bryce Martinez is a 73 y.o. male with hypertension, hyperlipidemia, recent admission for ARDS due to suspected aspiration during colonoscopy, also diagnosed with systolic CHF presenting with shortness of breath.  Was not on oxygen when left the hospital.  Reports that he has been weak but otherwise doing okay.  Yesterday began to feel worse with the breathing that was progressively worse.  Today went to urgent care where he was hypoxic to the 60s on room air.  He was placed on oxygen and transferred to the emergency department.  He reports had small amount of chest pain, no fevers.  Reports some nonproductive cough.  Did have x-ray showing possible pneumonia prior to leaving the hospital and was on Z-Pak.  Not currently on any antibiotics.  No leg swelling, back pain, abdominal pain, nausea or vomiting.   Past Medical History Past Medical History:  Diagnosis Date   Allergy    Arthritis    ED (erectile dysfunction)    GERD (gastroesophageal reflux disease)    Occasionally   Hepatitis C    Hyperlipidemia    Hypertension    Patient Active Problem List   Diagnosis Date Noted   Hospital discharge follow-up 10/25/2024   Aplastic anemia 10/25/2024   Unsteady gait 10/25/2024   Acute systolic heart failure (HCC) 10/25/2024   LLL pneumonia 10/22/2024   Adjustment disorder with mixed anxiety and depressed mood 10/15/2024   Malnutrition of moderate degree 10/14/2024   Critical illness myopathy 10/12/2024   ARDS (adult respiratory distress syndrome) (HCC) 09/27/2024   Acute hypoxic respiratory failure (HCC) 09/27/2024   Vertigo 08/24/2024   Encounter for Medicare annual examination with abnormal findings 05/21/2024   Hematuria 05/21/2024   Vitamin D  deficiency 10/30/2023   Tubular adenoma of colon 05/20/2023   Light  headedness 05/20/2023   Carotid bruit 05/20/2023   Mild epistaxis 11/25/2022   Sinusitis 10/09/2022   Hepatic fibrosis 11/06/2021   History of colonic polyps 11/06/2021   Ganglion cyst of finger 09/21/2021   Hypercalcemia 08/06/2021   Family history of colon cancer 06/28/2019   Hearing loss 06/03/2018   Spinal stenosis of lumbar region 09/03/2016   History of hepatitis C 07/26/2016   Right-sided low back pain with right-sided sciatica 07/26/2016   Neck pain 10/10/2014   Hepatitis C antibody test positive 10/10/2014   Allergic rhinitis 07/11/2011   ED (erectile dysfunction) 07/11/2011   Dyslipidemia 01/13/2008   Essential hypertension 01/13/2008   Home Medication(s) Prior to Admission medications  Medication Sig Start Date End Date Taking? Authorizing Provider  acetaminophen  (TYLENOL ) 325 MG tablet Take 1-2 tablets (325-650 mg total) by mouth every 4 (four) hours as needed for mild pain (pain score 1-3). 10/18/24   Jerilynn Daphne SAILOR, NP  lidocaine  (LIDODERM ) 5 % Place 1 patch onto the skin daily. Remove & Discard patch within 12 hours or as directed by MD 10/19/24   Jerilynn Daphne SAILOR, NP  losartan  (COZAAR ) 100 MG tablet TAKE 1 TABLET BY MOUTH EVERY DAY 09/28/24   Antonetta Rollene BRAVO, MD  pantoprazole  (PROTONIX ) 40 MG tablet Take 1 tablet (40 mg total) by mouth daily before breakfast. 10/19/24   Jerilynn Daphne SAILOR, NP  polyethylene glycol (MIRALAX  / GLYCOLAX ) 17 g packet Take 17 g by mouth daily. 10/19/24   Jerilynn Daphne SAILOR, NP  senna-docusate (SENOKOT-S) 8.6-50 MG tablet Take 2  tablets by mouth 2 (two) times daily. 10/18/24   Jerilynn Daphne SAILOR, NP  sodium chloride  1 g tablet Take 1 tablet (1 g total) by mouth daily for 7 days. 10/18/24 10/25/24  Jerilynn Daphne SAILOR, NP  traZODone  (DESYREL ) 50 MG tablet Take 1 tablet (50 mg total) by mouth at bedtime. 10/18/24   Jerilynn Daphne SAILOR, NP                                                                                                                                     Past Surgical History Past Surgical History:  Procedure Laterality Date   APPENDECTOMY     CHOLECYSTECTOMY N/A 04/25/2014   Procedure: LAPAROSCOPIC CHOLECYSTECTOMY;  Surgeon: Oneil DELENA Budge, MD;  Location: [facility];  Service: General;  Laterality: N/A;   COLONOSCOPY N/A 09/01/2019   Procedure: COLONOSCOPY;  Surgeon: Golda Claudis PENNER, MD;  Location: [facility];  Service: Endoscopy;  Laterality: N/A;  930   COLONOSCOPY N/A 09/27/2024   Procedure: COLONOSCOPY;  Surgeon: Eartha Angelia Sieving, MD;  Location: [facility];  Service: Gastroenterology;  Laterality: N/A;  7:30am, ASA 1-2   HEMICOLECTOMY Right    lap hand assisted   POLYPECTOMY  09/01/2019   Procedure: POLYPECTOMY;  Surgeon: Golda Claudis PENNER, MD;  Location: [facility];  Service: Endoscopy;;  colon   right shoulder surgery Right 03/2005   rotator cuff repair   Family History Family History  Problem Relation Age of Onset   Hypertension Mother    Cancer Father        colon    Hypertension Sister    Hypertension Brother    Diabetes Brother    Drug abuse Brother    Hypertension Daughter    Hypertension Son     Social History Social History[1] Allergies Amlodipine   Review of Systems Review of Systems  Physical Exam Vital Signs  I have reviewed the triage vital signs BP 116/83   Pulse 93   Temp 97.8 F (36.6 C) (Oral)   Resp (!) 37   Ht 6' 1 (1.854 m)   Wt 73 kg   SpO2 99%   BMI 21.24 kg/m  Physical Exam Vitals and nursing note reviewed.  Constitutional:      General: He is not in acute distress.    Appearance: Normal appearance.  HENT:     Mouth/Throat:     Mouth: Mucous membranes are moist.  Eyes:     Conjunctiva/sclera: Conjunctivae normal.  Neck:     Vascular: No hepatojugular reflux or JVD.  Cardiovascular:     Rate and Rhythm: Normal rate and regular rhythm.  Pulmonary:     Effort: Tachypnea, accessory muscle usage and respiratory distress present.      Breath sounds: Examination of the left-middle field reveals rales. Examination of the right-lower field reveals rales. Examination of the left-lower field reveals rales. Rales present. No wheezing.  Abdominal:  General: Abdomen is flat.     Palpations: Abdomen is soft.     Tenderness: There is no abdominal tenderness.  Musculoskeletal:     Right lower leg: No edema.     Left lower leg: No edema.  Skin:    General: Skin is warm and dry.     Capillary Refill: Capillary refill takes less than 2 seconds.  Neurological:     Mental Status: He is alert and oriented to person, place, and time. Mental status is at baseline.  Psychiatric:        Mood and Affect: Mood normal.        Behavior: Behavior normal.     ED Results and Treatments Labs (all labs ordered are listed, but only abnormal results are displayed) Labs Reviewed  COMPREHENSIVE METABOLIC PANEL WITH GFR - Abnormal; Notable for the following components:      Result Value   Sodium 134 (*)    Glucose, Bld 113 (*)    Albumin 2.9 (*)    ALT 55 (*)    All other components within normal limits  CBC WITH DIFFERENTIAL/PLATELET - Abnormal; Notable for the following components:   WBC 11.8 (*)    RBC 3.80 (*)    Hemoglobin 10.6 (*)    HCT 32.6 (*)    Neutro Abs 8.8 (*)    Monocytes Absolute 1.1 (*)    Abs Immature Granulocytes 0.43 (*)    All other components within normal limits  PRO BRAIN NATRIURETIC PEPTIDE - Abnormal; Notable for the following components:   Pro Brain Natriuretic Peptide 2,291.0 (*)    All other components within normal limits  D-DIMER, QUANTITATIVE - Abnormal; Notable for the following components:   D-Dimer, Quant >20.00 (*)    All other components within normal limits  BLOOD GAS, VENOUS - Abnormal; Notable for the following components:   pCO2, Ven 42 (*)    pO2, Ven <31 (*)    Acid-Base Excess 3.2 (*)    All other components within normal limits  PROTIME-INR - Abnormal; Notable for the following  components:   Prothrombin Time 15.8 (*)    All other components within normal limits  I-STAT CHEM 8, ED - Abnormal; Notable for the following components:   BUN 7 (*)    Glucose, Bld 117 (*)    Hemoglobin 12.9 (*)    HCT 38.0 (*)    All other components within normal limits  TROPONIN T, HIGH SENSITIVITY - Abnormal; Notable for the following components:   Troponin T High Sensitivity 77 (*)    All other components within normal limits  TROPONIN T, HIGH SENSITIVITY - Abnormal; Notable for the following components:   Troponin T High Sensitivity 77 (*)    All other components within normal limits  RESP PANEL BY RT-PCR (RSV, FLU A&B, COVID)  RVPGX2  CULTURE, BLOOD (ROUTINE X 2)  CULTURE, BLOOD (ROUTINE X 2)  APTT  CBC  HEPARIN  LEVEL (UNFRACTIONATED)  Radiology DG Chest Port 1 View Result Date: 10/25/2024 CLINICAL DATA:  Shortness of breath. EXAM: PORTABLE CHEST 1 VIEW COMPARISON:  10/17/2024. FINDINGS: The heart size and mediastinal contours are stable. Increased airspace disease and consolidation is noted in the left lung. There is a trace right pleural effusion. No pneumothorax is seen. No acute osseous abnormality. IMPRESSION: 1. Interval worsening of left lung consolidation. 2. Small right pleural effusion. Electronically Signed   By: Leita Birmingham M.D.   On: 10/25/2024 15:55    Pertinent labs & imaging results that were available during my care of the patient were reviewed by me and considered in my medical decision making (see MDM for details).  Medications Ordered in ED Medications  vancomycin  (VANCOREADY) IVPB 1500 mg/300 mL (1,500 mg Intravenous New Bag/Given 10/25/24 1734)    Followed by  vancomycin  (VANCOCIN ) IVPB 1000 mg/200 mL premix (has no administration in time range)  heparin  bolus via infusion 4,700 Units (has no administration in time range)  heparin   ADULT infusion 100 units/mL (25000 units/250mL) (has no administration in time range)  ipratropium-albuterol  (DUONEB) 0.5-2.5 (3) MG/3ML nebulizer solution 3 mL (3 mLs Nebulization Given 10/25/24 1529)  ceFEPIme  (MAXIPIME ) 2 g in sodium chloride  0.9 % 100 mL IVPB (0 g Intravenous Stopped 10/25/24 1720)  azithromycin  (ZITHROMAX ) 500 mg in sodium chloride  0.9 % 250 mL IVPB (0 mg Intravenous Stopped 10/25/24 1750)  iohexol  (OMNIPAQUE ) 350 MG/ML injection 75 mL (75 mLs Intravenous Contrast Given 10/25/24 1715)                                                                                                                                     Procedures Ultrasound ED Echo  Date/Time: 10/25/2024 3:07 PM  Performed by: Francesca Elsie CROME, MD Authorized by: Francesca Elsie CROME, MD   Procedure details:    Indications: dyspnea     Views: parasternal long axis view and IVC view     Images: archived   Findings:    Pericardium: no pericardial effusion     LV Function: normal (>50% EF)     RV Diameter: normal     IVC: collapsed   Impression:    Impression: probable low CVP   .Critical Care  Performed by: Francesca Elsie CROME, MD Authorized by: Francesca Elsie CROME, MD   Critical care provider statement:    Critical care time (minutes):  45   Critical care was necessary to treat or prevent imminent or life-threatening deterioration of the following conditions:  Cardiac failure and respiratory failure   Critical care was time spent personally by me on the following activities:  Development of treatment plan with patient or surrogate, discussions with consultants, evaluation of patient's response to treatment, examination of patient, ordering and review of laboratory studies, ordering and review of radiographic studies, ordering and performing treatments and interventions, pulse oximetry, re-evaluation of patient's condition and review of old charts   Care discussed  with: admitting provider and  accepting provider at another facility     (including critical care time)  Medical Decision Making / ED Course   MDM:  73 year old presenting to the emergency department with shortness of breath.  Patient  weak appearing, tachypneic, with mild respiratory distress.  5 L of oxygen.  Exam with left greater than right lower lobe crackles.  Suspect possible recurrent or persistent pneumonia, was recently admitted, had x-ray only showing pneumonia.  Had been on Z-Pak but completed the course.  Considered other process such as CHF given depressed EF, bedside ultrasound today overall seems okay, collapsible IVC and EF seems relatively normal, no JVD other signs of volume overload.  Lower concern for other process such as pneumothorax.  No wheezing to suggest bronchospasm.  Did report some mild chest discomfort but seems atypical for PE, no pleuritic pain or tachycardia, will check a D-dimer.  Will reassess.  Given work of breathing will start patient on BiPAP.   Clinical Course as of 10/25/24 1834  Mon Oct 25, 2024  1831 Patient workup with elevated D-dimer.  CT scan was pursued which showed left-sided pneumonia/airspace disease as well as right-sided PE with moderate clot burden.  Discussed with ICU physician Dr. Hussain, given patient's respiratory status and tachypnea on BiPAP.  He accepted patient to ICU.  Patient has been initiated on heparin , broad-spectrum antibiotics.  Discussed with patient and his wife who are understanding of the plan.  Patient will be transferred to [facility] for admission to ICU. [WS]    Clinical Course User Index [WS] Francesca Elsie CROME, MD     Additional history obtained: -Additional history obtained from ems and spouse -External records from outside source obtained and reviewed including: Chart review including previous notes, labs, imaging, consultation notes including recent imaging and hospitalization   Lab Tests: -I ordered, reviewed, and  interpreted labs.   The pertinent results include:   Labs Reviewed  COMPREHENSIVE METABOLIC PANEL WITH GFR - Abnormal; Notable for the following components:      Result Value   Sodium 134 (*)    Glucose, Bld 113 (*)    Albumin 2.9 (*)    ALT 55 (*)    All other components within normal limits  CBC WITH DIFFERENTIAL/PLATELET - Abnormal; Notable for the following components:   WBC 11.8 (*)    RBC 3.80 (*)    Hemoglobin 10.6 (*)    HCT 32.6 (*)    Neutro Abs 8.8 (*)    Monocytes Absolute 1.1 (*)    Abs Immature Granulocytes 0.43 (*)    All other components within normal limits  PRO BRAIN NATRIURETIC PEPTIDE - Abnormal; Notable for the following components:   Pro Brain Natriuretic Peptide 2,291.0 (*)    All other components within normal limits  D-DIMER, QUANTITATIVE - Abnormal; Notable for the following components:   D-Dimer, Quant >20.00 (*)    All other components within normal limits  BLOOD GAS, VENOUS - Abnormal; Notable for the following components:   pCO2, Ven 42 (*)    pO2, Ven <31 (*)    Acid-Base Excess 3.2 (*)    All other components within normal limits  PROTIME-INR - Abnormal; Notable for the following components:   Prothrombin Time 15.8 (*)    All other components within normal limits  I-STAT CHEM 8, ED - Abnormal; Notable for the following components:   BUN 7 (*)    Glucose, Bld 117 (*)    Hemoglobin 12.9 (*)  HCT 38.0 (*)    All other components within normal limits  TROPONIN T, HIGH SENSITIVITY - Abnormal; Notable for the following components:   Troponin T High Sensitivity 77 (*)    All other components within normal limits  TROPONIN T, HIGH SENSITIVITY - Abnormal; Notable for the following components:   Troponin T High Sensitivity 77 (*)    All other components within normal limits  RESP PANEL BY RT-PCR (RSV, FLU A&B, COVID)  RVPGX2  CULTURE, BLOOD (ROUTINE X 2)  CULTURE, BLOOD (ROUTINE X 2)  APTT  CBC  HEPARIN  LEVEL (UNFRACTIONATED)    Notable for  elevated BNP, elevated d-dimer, elevated troponin, leukocytosis   EKG   EKG Interpretation Date/Time:  Monday October 25 2024 14:31:45 EST Ventricular Rate:  96 PR Interval:  139 QRS Duration:  86 QT Interval:  348 QTC Calculation: 440 R Axis:   94  Text Interpretation: Sinus rhythm Right axis deviation Anteroseptal infarct, age indeterminate Confirmed by Francesca Fallow ([id number]) on 10/25/2024 3:17:07 PM         Imaging Studies ordered: I ordered imaging studies including CXR, CT chest On my interpretation imaging demonstrates PE, pneumonia  I independently visualized and interpreted imaging. I agree with the radiologist interpretation   Medicines ordered and prescription drug management: Meds ordered this encounter  Medications   ipratropium-albuterol  (DUONEB) 0.5-2.5 (3) MG/3ML nebulizer solution 3 mL   ceFEPIme  (MAXIPIME ) 2 g in sodium chloride  0.9 % 100 mL IVPB   azithromycin  (ZITHROMAX ) 500 mg in sodium chloride  0.9 % 250 mL IVPB    Antibiotic Indication::   CAP   FOLLOWED BY Linked Order Group    vancomycin  (VANCOREADY) IVPB 1500 mg/300 mL     Indication::   Pneumonia    vancomycin  (VANCOCIN ) IVPB 1000 mg/200 mL premix     Indication::   Pneumonia   iohexol  (OMNIPAQUE ) 350 MG/ML injection 75 mL   heparin  bolus via infusion 4,700 Units   heparin  ADULT infusion 100 units/mL (25000 units/250mL)    -I have reviewed the patients home medicines and have made adjustments as needed   Consultations Obtained: I requested consultation with the ICU physician,  and discussed lab and imaging findings as well as pertinent plan - they recommend: admit to ICU    Cardiac Monitoring: The patient was maintained on a cardiac monitor.  I personally viewed and interpreted the cardiac monitored which showed an underlying rhythm of: NSR  Reevaluation: After the interventions noted above, I reevaluated the patient and found that their symptoms have improved  Co morbidities that  complicate the patient evaluation  Past Medical History:  Diagnosis Date   Allergy    Arthritis    ED (erectile dysfunction)    GERD (gastroesophageal reflux disease)    Occasionally   Hepatitis C    Hyperlipidemia    Hypertension       Dispostion: Disposition decision including need for hospitalization was considered, and patient admitted to hospital    Final Clinical Impression(s) / ED Diagnoses Final diagnoses:  Acute hypoxic respiratory failure (HCC)  Pneumonia of left lower lobe due to infectious organism  Acute pulmonary embolism with acute cor pulmonale, unspecified pulmonary embolism type (HCC)     This chart was dictated using voice recognition software.  Despite best efforts to proofread,  errors can occur which can change the documentation meaning.     [1]  Social History Tobacco Use   Smoking status: Former    Current packs/day: 0.00    Average packs/day:  0.3 packs/day for 17.3 years (4.4 ttl pk-yrs)    Types: Cigarettes    Start date: 04/19/1957    Quit date: 04/19/1972    Years since quitting: 52.5    Passive exposure: Past   Smokeless tobacco: Never  Vaping Use   Vaping status: Never Used  Substance Use Topics   Alcohol use: Not Currently    Comment: special occasions   Drug use: No     Francesca Elsie CROME, MD 10/25/24 1835  "

## 2024-10-25 NOTE — Assessment & Plan Note (Signed)
Patient in for follow up of recent hospitalization. Discharge summary, and laboratory and radiology data are reviewed, and any questions or concerns  are discussed. Specific issues requiring follow up are specifically addressed.  

## 2024-10-25 NOTE — Assessment & Plan Note (Signed)
 Continue lidoderm  patches

## 2024-10-25 NOTE — Assessment & Plan Note (Signed)
 Frequent small meals with emphasis on healthy food choice for good nutrition encouraged

## 2024-10-25 NOTE — Assessment & Plan Note (Signed)
 Trazodone  at bed time  to be continued

## 2024-10-26 ENCOUNTER — Inpatient Hospital Stay (HOSPITAL_COMMUNITY)

## 2024-10-26 ENCOUNTER — Other Ambulatory Visit (HOSPITAL_COMMUNITY): Payer: Self-pay

## 2024-10-26 ENCOUNTER — Telehealth (HOSPITAL_COMMUNITY): Payer: Self-pay

## 2024-10-26 DIAGNOSIS — E119 Type 2 diabetes mellitus without complications: Secondary | ICD-10-CM | POA: Diagnosis not present

## 2024-10-26 DIAGNOSIS — J9601 Acute respiratory failure with hypoxia: Secondary | ICD-10-CM | POA: Diagnosis not present

## 2024-10-26 DIAGNOSIS — I5020 Unspecified systolic (congestive) heart failure: Secondary | ICD-10-CM | POA: Diagnosis not present

## 2024-10-26 DIAGNOSIS — R5381 Other malaise: Secondary | ICD-10-CM | POA: Diagnosis not present

## 2024-10-26 DIAGNOSIS — I5022 Chronic systolic (congestive) heart failure: Secondary | ICD-10-CM

## 2024-10-26 DIAGNOSIS — J189 Pneumonia, unspecified organism: Secondary | ICD-10-CM

## 2024-10-26 DIAGNOSIS — I2609 Other pulmonary embolism with acute cor pulmonale: Secondary | ICD-10-CM | POA: Diagnosis not present

## 2024-10-26 DIAGNOSIS — K219 Gastro-esophageal reflux disease without esophagitis: Secondary | ICD-10-CM | POA: Diagnosis not present

## 2024-10-26 DIAGNOSIS — R0609 Other forms of dyspnea: Secondary | ICD-10-CM

## 2024-10-26 DIAGNOSIS — I2699 Other pulmonary embolism without acute cor pulmonale: Secondary | ICD-10-CM

## 2024-10-26 DIAGNOSIS — D649 Anemia, unspecified: Secondary | ICD-10-CM | POA: Diagnosis not present

## 2024-10-26 DIAGNOSIS — E785 Hyperlipidemia, unspecified: Secondary | ICD-10-CM

## 2024-10-26 DIAGNOSIS — G729 Myopathy, unspecified: Secondary | ICD-10-CM | POA: Diagnosis not present

## 2024-10-26 DIAGNOSIS — I110 Hypertensive heart disease with heart failure: Secondary | ICD-10-CM | POA: Diagnosis not present

## 2024-10-26 DIAGNOSIS — Z86711 Personal history of pulmonary embolism: Secondary | ICD-10-CM

## 2024-10-26 LAB — ECHOCARDIOGRAM COMPLETE
AR max vel: 3.49 cm2
AV Area VTI: 3.48 cm2
AV Area mean vel: 3.3 cm2
AV Mean grad: 2 mmHg
AV Peak grad: 3.2 mmHg
Ao pk vel: 0.9 m/s
Area-P 1/2: 3.72 cm2
Height: 73 in
S' Lateral: 3.6 cm
Weight: 2652.57 [oz_av]

## 2024-10-26 LAB — HEPARIN LEVEL (UNFRACTIONATED)
Heparin Unfractionated: 0.2 [IU]/mL — ABNORMAL LOW (ref 0.30–0.70)
Heparin Unfractionated: 0.23 [IU]/mL — ABNORMAL LOW (ref 0.30–0.70)

## 2024-10-26 LAB — CBC
HCT: 31.1 % — ABNORMAL LOW (ref 39.0–52.0)
Hemoglobin: 10.3 g/dL — ABNORMAL LOW (ref 13.0–17.0)
MCH: 28 pg (ref 26.0–34.0)
MCHC: 33.1 g/dL (ref 30.0–36.0)
MCV: 84.5 fL (ref 80.0–100.0)
Platelets: 270 K/uL (ref 150–400)
RBC: 3.68 MIL/uL — ABNORMAL LOW (ref 4.22–5.81)
RDW: 14.6 % (ref 11.5–15.5)
WBC: 12.4 K/uL — ABNORMAL HIGH (ref 4.0–10.5)
nRBC: 0 % (ref 0.0–0.2)

## 2024-10-26 LAB — PROCALCITONIN: Procalcitonin: <0.1 ng/mL

## 2024-10-26 LAB — GLUCOSE, CAPILLARY: Glucose-Capillary: 137 mg/dL — ABNORMAL HIGH (ref 70–99)

## 2024-10-26 LAB — MRSA NEXT GEN BY PCR, NASAL: MRSA by PCR Next Gen: NOT DETECTED

## 2024-10-26 MED ORDER — LOSARTAN POTASSIUM 50 MG PO TABS
100.0000 mg | ORAL_TABLET | Freq: Every day | ORAL | Status: DC
  Administered 2024-10-26 – 2024-10-29 (×4): 100 mg via ORAL
  Filled 2024-10-26 (×4): qty 2

## 2024-10-26 MED ORDER — PANTOPRAZOLE SODIUM 40 MG PO TBEC
40.0000 mg | DELAYED_RELEASE_TABLET | Freq: Every day | ORAL | Status: DC
  Administered 2024-10-27 – 2024-10-29 (×3): 40 mg via ORAL
  Filled 2024-10-26 (×3): qty 1

## 2024-10-26 MED ORDER — SENNOSIDES-DOCUSATE SODIUM 8.6-50 MG PO TABS
2.0000 | ORAL_TABLET | Freq: Two times a day (BID) | ORAL | Status: DC
  Administered 2024-10-26 – 2024-10-29 (×7): 2 via ORAL
  Filled 2024-10-26 (×6): qty 2

## 2024-10-26 MED ORDER — DOXYCYCLINE HYCLATE 100 MG PO TABS
100.0000 mg | ORAL_TABLET | Freq: Two times a day (BID) | ORAL | Status: DC
  Administered 2024-10-26 – 2024-10-29 (×7): 100 mg via ORAL
  Filled 2024-10-26 (×7): qty 1

## 2024-10-26 MED ORDER — POLYETHYLENE GLYCOL 3350 17 G PO PACK
17.0000 g | PACK | Freq: Every day | ORAL | Status: DC
  Administered 2024-10-26 – 2024-10-29 (×4): 17 g via ORAL
  Filled 2024-10-26 (×4): qty 1

## 2024-10-26 MED ORDER — ACETAMINOPHEN 325 MG PO TABS
650.0000 mg | ORAL_TABLET | ORAL | Status: DC | PRN
  Administered 2024-10-26 – 2024-10-28 (×2): 650 mg via ORAL
  Filled 2024-10-26 (×2): qty 2

## 2024-10-26 MED ORDER — SODIUM CHLORIDE 0.9 % IV SOLN
100.0000 mg | Freq: Two times a day (BID) | INTRAVENOUS | Status: DC
  Filled 2024-10-26: qty 100

## 2024-10-26 MED ORDER — CHLORHEXIDINE GLUCONATE CLOTH 2 % EX PADS
6.0000 | MEDICATED_PAD | Freq: Every day | CUTANEOUS | Status: DC
  Administered 2024-10-26 – 2024-10-29 (×4): 6 via TOPICAL

## 2024-10-26 MED ORDER — LIDOCAINE 5 % EX PTCH
1.0000 | MEDICATED_PATCH | Freq: Every day | CUTANEOUS | Status: DC
  Administered 2024-10-28 – 2024-10-29 (×2): 1 via TRANSDERMAL
  Filled 2024-10-26 (×4): qty 1

## 2024-10-26 MED ORDER — TRAZODONE HCL 50 MG PO TABS
50.0000 mg | ORAL_TABLET | Freq: Every day | ORAL | Status: DC
  Administered 2024-10-26 – 2024-10-28 (×3): 50 mg via ORAL
  Filled 2024-10-26 (×3): qty 1

## 2024-10-26 MED ORDER — SODIUM CHLORIDE 0.9 % IV SOLN: 500.0000 mg | INTRAVENOUS | Status: DC

## 2024-10-26 MED ORDER — SODIUM CHLORIDE 0.9 % IV SOLN
1.0000 g | INTRAVENOUS | Status: DC
  Administered 2024-10-26 – 2024-10-27 (×2): 1 g via INTRAVENOUS
  Filled 2024-10-26 (×3): qty 10

## 2024-10-26 MED ORDER — HEPARIN BOLUS VIA INFUSION
1500.0000 [IU] | Freq: Once | INTRAVENOUS | Status: AC
  Administered 2024-10-26: 1500 [IU] via INTRAVENOUS
  Filled 2024-10-26: qty 1500

## 2024-10-26 MED ORDER — OXYCODONE HCL 5 MG PO TABS: 5.0000 mg | ORAL_TABLET | ORAL | Status: DC | PRN

## 2024-10-26 NOTE — TOC CM/SW Note (Signed)
 Transition of Care [facility]) - Inpatient Brief Assessment   Patient Details  Name: YAHIA BOTTGER MRN: [id number] Date of Birth: 1951-05-08  Transition of Care [facility]) CM/SW Contact:    Lauraine FORBES Saa, LCSWA Phone Number: 10/26/2024, 2:01 PM   Clinical Narrative:  2:02 PM Per chart review, patient resides at home with spouse. Patient has a PCP and insurance. Patient was recently admitted at [facility] (12/9-12/15). Patient does not have SNF or HH history. Patient has OPPT and DME (rollator, tub transfer bench) history. Patient does not have SDOH needs (as of November 26th, 2025). No TOC needs identified at this time. TOC will continue to follow.  Transition of Care Asessment: Insurance and Status: Insurance coverage has been reviewed Patient has primary care physician: Yes Home environment has been reviewed: Private Residence Prior level of function:: N/A Prior/Current Home Services: No current home services Social Drivers of Health Review: SDOH reviewed no interventions necessary Readmission risk has been reviewed: Yes (Currently Yellow 16%) Transition of care needs: no transition of care needs at this time

## 2024-10-26 NOTE — Consult Note (Signed)
 "  Cardiology Consultation  Patient ID: Bryce Martinez MRN: [id number]; DOB: 1951-05-20  Admit date: 10/25/2024 Date of Consult: 10/26/2024  PCP:  Clinic, [facility] Providers Cardiologist:  New, wants to establish at [facility] office  Patient Profile: Bryce Martinez is a 73 y.o. male with a hx of hypertension, hyperlipidemia, chronic HFrEF, hepatic fibrosis secondary to hepatitis C, GERD, osteoarthritis, who is being seen 10/26/2024 for the evaluation of cardiomyopathy at the request of Dr. Kassie.  History of Present Illness: Bryce Martinez has past medical history stated above.  He initially presented to the [place] urgent care on 10/25/2024 complaining of acute severe shortness of breath, weakness, presyncope.  He was then sent to [facility] for further evaluation.  He did not require oxygen at discharge 10/15/2024.  However he was noted to have SpO2 64% on room air.  He reported that he had been recently diagnosed with a left lower lobe pneumonia and was started on a Z-Pak.  He reported that he had somewhat improvement after starting his antibiotic regimen however it got significantly worse 10/25/2024 which prompted his arrival to the urgent care/transferred to [facility] ED.  The decision was then made to transfer to [facility] for further evaluation and treatment.  Relevant workup this admission includes: Troponin level 77 ? 77, respiratory panel positive for COVID, proBNP elevated at 2,291 (noted to be 62 four weeks ago), d-dimer > 20, lactic acid negative x 2, pro calcitonin negative.  EKG showed sinus rhythm, HR 96, right axis deviation.   CTPA showed moderate volume occlusive and nonocclusive PEs throughout the right lung, findings worrisome for right heart strain, multifocal pneumonia, small left pleural effusion, trace right pleural effusion.  Echocardiogram this admission showed: LVEF 45 to 50%, global hypokinesis, G1 DD,  normal RV systolic function, normal PASP, mild aortic regurgitation, normal IVC.  LVEF improved from November 2025 where it was 25 to 30%.  Patient was admitted to the pulmonary critical care team.  IR was consulted, no indication for thrombectomy at this time.  Patient was presently on BiPAP, attempting to wean to [facility].  Continued on broad-spectrum antibiotics.  Continued on IV heparin  with plans to transition to DOAC closer to discharge.  Cardiology was consulted in the setting of CHF.   Recent prior admission 11/24-12/10/2024: He had a recent admission in the hospital, discharged 10/15/2024.  He had initially presented on 09/27/2024 to [facility] for routine screening colonoscopy.  This colonoscopy was complicated by aspiration during the procedure.  He was monitored on room air and then sent home.  However his respiratory status worsened significantly and he returned back to the hospital same day with complaints of shortness of breath, abdominal pain, back pain.  He initially had required 15 L of oxygen, ABG confirmed metabolic acidosis and he was placed on BiPAP and treated with IV Unasyn , DuoNeb and steroids in the ER.  He was subsequently intubated and admitted by critical care and transferred to [facility].  He was extubated successfully 10/04/2024.  During this most recent admission he was diagnosed with acute systolic heart failure, EF noted to be 25 to 30%.  He was also noted to be COVID-positive.  He was then admitted to inpatient rehab on 10/12/2024 where he was noted to be doing fairly well.  He was subsequently discharged 10/15/2024 on the following medications: Azithromycin , Keflex , losartan  100 mg daily.  He had not been established or seen by cardiology during  this admission.  The recommendation from primary care included repeat echocardiogram in 3 to 4 weeks as an outpatient.  As previously stated, cardiology had never been involved during previous stay.  After speaking with the  patient, he agrees with history stated above.  He tells me that overall since November of this year he has been dealing with all of these acute issues.  He admits to having a sudden onset of shortness of breath which prompted him to go to the urgent care to get evaluated.  We discussed all of his findings on recent imaging.  I discussed how his ejection fraction has improved since the last time that it was evaluated in November 2025.  Overall he is doing well on HHFNC, weaned down from BiPAP.  He tells me that he feels significantly better however he remains fatigued.  We discussed that likely  Past Medical History:  Diagnosis Date   Allergy    Arthritis    ED (erectile dysfunction)    GERD (gastroesophageal reflux disease)    Occasionally   Hepatitis C    Hyperlipidemia    Hypertension    Past Surgical History:  Procedure Laterality Date   APPENDECTOMY     CHOLECYSTECTOMY N/A 04/25/2014   Procedure: LAPAROSCOPIC CHOLECYSTECTOMY;  Surgeon: Oneil DELENA Budge, MD;  Location: [facility] OR[facility];  Service: General;  Laterality: N/A;   COLONOSCOPY N/A 09/01/2019   Procedure: COLONOSCOPY;  Surgeon: Golda Claudis PENNER, MD;  Location: [facility] END[facility] SUITE;  Service: Endoscopy;  Laterality: N/A;  930   COLONOSCOPY N/A 09/27/2024   Procedure: COLONOSCOPY;  Surgeon: Eartha Angelia Sieving, MD;  Location: [facility] END[facility] SUITE;  Service: Gastroenterology;  Laterality: N/A;  7:30am, ASA 1-2   HEMICOLECTOMY Right    lap hand assisted   POLYPECTOMY  09/01/2019   Procedure: POLYPECTOMY;  Surgeon: Golda Claudis PENNER, MD;  Location: [facility];  Service: Endoscopy;;  colon   right shoulder surgery Right 03/2005   rotator cuff repair    Home Medications:  Prior to Admission medications  Medication Sig Start Date End Date Taking? Authorizing Provider  acetaminophen  (TYLENOL ) 325 MG tablet Take 1-2 tablets (325-650 mg total) by mouth every 4 (four) hours as needed for mild pain (pain score 1-3). 10/18/24  Yes Jerilynn Daphne SAILOR, NP   azithromycin  (ZITHROMAX ) 250 MG tablet Take 250 mg by mouth daily. 3 day course completed 10/22/2024   Yes [provider]  cephALEXin  (KEFLEX ) 500 MG capsule Take 500 mg by mouth 3 (three) times daily. 3 day course completed 10/22/2024   Yes [provider]  guaiFENesin  (MUCINEX ) 600 MG 12 hr tablet Take 600 mg by mouth in the morning and at bedtime.   Yes [provider]  lidocaine  (LIDODERM ) 5 % Place 1 patch onto the skin daily. Remove & Discard patch within 12 hours or as directed by MD 10/19/24  Yes Jerilynn Daphne SAILOR, NP  losartan  (COZAAR ) 100 MG tablet TAKE 1 TABLET BY MOUTH EVERY DAY 09/28/24  Yes Antonetta Rollene BRAVO, MD  pantoprazole  (PROTONIX ) 40 MG tablet Take 1 tablet (40 mg total) by mouth daily before breakfast. 10/19/24  Yes Jerilynn Daphne SAILOR, NP  senna-docusate (SENOKOT-S) 8.6-50 MG tablet Take 2 tablets by mouth 2 (two) times daily. 10/18/24  Yes Jerilynn Daphne SAILOR, NP  sodium chloride  (OCEAN) 0.65 % SOLN nasal spray Place 1 spray into both nostrils as needed for congestion.   Yes [provider]  traZODone  (DESYREL ) 50 MG tablet Take 1 tablet (50 mg total)  by mouth at bedtime. 10/18/24  Yes Jerilynn Daphne SAILOR, NP  polyethylene glycol (MIRALAX  / GLYCOLAX ) 17 g packet Take 17 g by mouth daily. 10/19/24   Jerilynn Daphne SAILOR, NP   Scheduled Meds:  Chlorhexidine  Gluconate Cloth  6 each Topical Daily   doxycycline   100 mg Oral Q12H   lidocaine   1 patch Transdermal Daily   losartan   100 mg Oral Daily   [START ON 10/27/2024] pantoprazole   40 mg Oral QAC breakfast   polyethylene glycol  17 g Oral Daily   senna-docusate  2 tablet Oral BID   traZODone   50 mg Oral QHS   Continuous Infusions:  cefTRIAXone  (ROCEPHIN )  IV     heparin  1,650 Units/hr (10/26/24 1400)   PRN Meds: guaiFENesin   Allergies:   Allergies[1]  Social History:   Social History   Socioeconomic History   Marital status: Married    Spouse name: Not on file   Number of  children: Not on file   Years of education: Not on file   Highest education level: Associate degree: occupational, scientist, product/process development, or vocational program  Occupational History   Not on file  Tobacco Use   Smoking status: Former    Current packs/day: 0.00    Average packs/day: 0.3 packs/day for 17.3 years (4.4 ttl pk-yrs)    Types: Cigarettes    Start date: 04/19/1957    Quit date: 04/19/1972    Years since quitting: 52.5    Passive exposure: Past   Smokeless tobacco: Never  Vaping Use   Vaping status: Never Used  Substance and Sexual Activity   Alcohol use: Not Currently    Comment: special occasions   Drug use: No   Sexual activity: Not Currently    Birth control/protection: None  Other Topics Concern   Not on file  Social History Narrative   Not on file   Social Drivers of Health   Tobacco Use: Medium Risk (10/25/2024)   Patient History    Smoking Tobacco Use: Former    Smokeless Tobacco Use: Never    Passive Exposure: Past  Physicist, Medical Strain: Low Risk (06/28/2024)   Overall Financial Resource Strain (CARDIA)    Difficulty of Paying Living Expenses: Not hard at all  Food Insecurity: No Food Insecurity (09/29/2024)   Epic    Worried About Programme Researcher, Broadcasting/film/video in the Last Year: Never true    Ran Out of Food in the Last Year: Never true  Transportation Needs: No Transportation Needs (09/29/2024)   Epic    Lack of Transportation (Medical): No    Lack of Transportation (Non-Medical): No  Physical Activity: Insufficiently Active (06/28/2024)   Exercise Vital Sign    Days of Exercise per Week: 3 days    Minutes of Exercise per Session: 20 min  Stress: No Stress Concern Present (06/28/2024)   [employer] of Occupational Health - Occupational Stress Questionnaire    Feeling of Stress: Only a little  Social Connections: Moderately Integrated (09/29/2024)   Social Connection and Isolation Panel    Frequency of Communication with Friends and Family: Once a week     Frequency of Social Gatherings with Friends and Family: Once a week    Attends Religious Services: More than 4 times per year    Active Member of [employer] or Organizations: Yes    Attends Banker Meetings: More than 4 times per year    Marital Status: Married  Catering Manager Violence: Not At Risk (06/29/2024)   Epic  Fear of Current or Ex-Partner: No    Emotionally Abused: No    Physically Abused: No    Sexually Abused: No  Depression (PHQ2-9): Low Risk (10/22/2024)   Depression (PHQ2-9)    PHQ-2 Score: 2  Alcohol Screen: Low Risk (06/28/2024)   Alcohol Screen    Last Alcohol Screening Score (AUDIT): 0  Housing: Low Risk (09/29/2024)   Epic    Unable to Pay for Housing in the Last Year: No    Number of Times Moved in the Last Year: 0    Homeless in the Last Year: No  Utilities: Not At Risk (09/29/2024)   Epic    Threatened with loss of utilities: No  Health Literacy: Adequate Health Literacy (06/29/2024)   B1300 Health Literacy    Frequency of need for help with medical instructions: Never    Family History:   Family History  Problem Relation Age of Onset   Hypertension Mother    Cancer Father        colon    Hypertension Sister    Hypertension Brother    Diabetes Brother    Drug abuse Brother    Hypertension Daughter    Hypertension Son     ROS:  Please see the history of present illness.  All other ROS reviewed and negative.     Physical Exam/Data: Vitals:   10/26/24 1200 10/26/24 1300 10/26/24 1400 10/26/24 1506  BP: (!) 145/83 127/85 109/68   Pulse: 74 75 87   Resp: (!) 29 (!) 31 (!) 28   Temp:    98.4 F (36.9 C)  TempSrc:    Oral  SpO2: 97% 98% 97%   Weight:      Height:        Intake/Output Summary (Last 24 hours) at 10/26/2024 1517 Last data filed at 10/26/2024 1400 Gross per 24 hour  Intake 1655.23 ml  Output 900 ml  Net 755.23 ml      10/26/2024    6:00 AM 10/25/2024    2:29 PM 10/22/2024    8:05 AM  Last 3 Weights  Weight  (lbs) 165 lb 12.6 oz 161 lb 161 lb 0.6 oz  Weight (kg) 75.2 kg 73.029 kg 73.047 kg     Body mass index is 21.87 kg/m.   General: In no acute distress, currently on HHFNC, family present in room HEENT: normal Neck: no JVD Vascular: No carotid bruits; Distal pulses 2+ bilaterally Cardiac:  normal S1, S2; RRR; no murmur  Lungs: Diminished in bases Abd: soft, nontender, no hepatomegaly  Ext: no edema Musculoskeletal:  No deformities Skin: warm and dry  Neuro:  no focal abnormalities noted Psych:  Normal affect   Telemetry:  Telemetry was personally reviewed and demonstrates: Sinus rhythm, PVCs  Relevant CV Studies:  Echocardiogram, 09/28/2024 Left ventricular ejection fraction, by estimation, is 45 to 50% . The left ventricle has mildly decreased function. The left ventricle demonstrates global hypokinesis. Left ventricular diastolic parameters are consistent with Grade I diastolic dysfunction ( impaired relaxation) .  Right ventricular systolic function is normal. The right ventricular size is mildly enlarged. There is normal pulmonary artery systolic pressure. The estimated right ventricular systolic pressure is 25. 7 mmHg.  The mitral valve is normal in structure. No evidence of mitral valve regurgitation. No evidence of mitral stenosis.  The aortic valve is normal in structure. Aortic valve regurgitation is mild. No aortic stenosis is present.  The inferior vena cava is normal in size with greater than 50% respiratory  variability, suggesting right atrial pressure of 3 mmHg.  Echocardiogram, 10/26/2024 Left ventricular ejection fraction, by estimation, is 25 to 30% . The left ventricle has severely decreased function. The left ventricle demonstrates global hypokinesis. There is mild concentric left ventricular hypertrophy. Left ventricular diastolic parameters are indeterminate.  Right ventricular systolic function is normal. The right ventricular size is normal. Tricuspid  regurgitation signal is inadequate for assessing PA pressure.  Moderate pleural effusion in the left lateral region.  The mitral valve is grossly normal. Trivial mitral valve regurgitation. No evidence of mitral stenosis.  The aortic valve is tricuspid. Aortic valve regurgitation is not visualized. No aortic stenosis is present.  Laboratory Data: High Sensitivity Troponin:  No results for input(s): TROPONINIHS in the last 720 hours.  Recent Labs  Lab 09/27/24 1106 09/27/24 1157 10/25/24 1527 10/25/24 1649  TRNPT 15 21* 77* 77*      Chemistry Recent Labs  Lab 10/22/24 0857 10/25/24 1527 10/25/24 1529  NA 134 134* 136  K 4.6 4.0 4.0  CL 99 100 99  CO2 20 23  --   GLUCOSE 92 113* 117*  BUN 8 8 7*  CREATININE 1.00 0.90 1.10  CALCIUM  9.1 9.1  --   GFRNONAA  --  >60  --   ANIONGAP  --  12  --     Recent Labs  Lab 10/25/24 1527  PROT 7.3  ALBUMIN 2.9*  AST 25  ALT 55*  ALKPHOS 81  BILITOT 0.4   Lipids No results for input(s): CHOL, TRIG, HDL, LABVLDL, LDLCALC, CHOLHDL in the last 168 hours.  Hematology Recent Labs  Lab 10/22/24 0857 10/25/24 1527 10/25/24 1529 10/26/24 0259  WBC 9.1 11.8*  --  12.4*  RBC 3.93* 3.80*  --  3.68*  HGB 11.0* 10.6* 12.9* 10.3*  HCT 34.0* 32.6* 38.0* 31.1*  MCV 87 85.8  --  84.5  MCH 28.0 27.9  --  28.0  MCHC 32.4 32.5  --  33.1  RDW 13.5 14.5  --  14.6  PLT 291 258  --  270   Thyroid  No results for input(s): TSH, FREET4 in the last 168 hours.  BNP Recent Labs  Lab 10/25/24 1527  PROBNP 2,291.0*    DDimer  Recent Labs  Lab 10/25/24 1527  DDIMER >20.00*   Radiology/Studies:  VAS US  LOWER EXTREMITY VENOUS (DVT) Result Date: 10/26/2024  Lower Venous DVT Study Patient Name:  Bryce Martinez  Date of Exam:   10/26/2024 Medical Rec #: [id number]           Accession #:    [id number] Date of Birth: 05-Dec-1950           Patient Gender: M Patient Age:   7 years Exam Location:  [facility] Procedure:       VAS US  LOWER EXTREMITY VENOUS (DVT) Referring Phys: STEPHANIE REESE --------------------------------------------------------------------------------  Indications: Pulmonary embolism.  Risk Factors: Confirmed PE. Anticoagulation: Heparin . Comparison Study: No prior studies. Performing Technologist: Cordella Collet RVT  Examination Guidelines: A complete evaluation includes B-mode imaging, spectral Doppler, color Doppler, and power Doppler as needed of all accessible portions of each vessel. Bilateral testing is considered an integral part of a complete examination. Limited examinations for reoccurring indications may be performed as noted. The reflux portion of the exam is performed with the patient in reverse Trendelenburg.  +---------+---------------+---------+-----------+----------+--------------+ RIGHT    CompressibilityPhasicitySpontaneityPropertiesThrombus Aging +---------+---------------+---------+-----------+----------+--------------+ CFV      Full           Yes  Yes                                 +---------+---------------+---------+-----------+----------+--------------+ SFJ      Full                                                        +---------+---------------+---------+-----------+----------+--------------+ FV Prox  Full                                                        +---------+---------------+---------+-----------+----------+--------------+ FV Mid   Full                                                        +---------+---------------+---------+-----------+----------+--------------+ FV DistalFull                                                        +---------+---------------+---------+-----------+----------+--------------+ PFV      Full                                                        +---------+---------------+---------+-----------+----------+--------------+ POP      Partial        Yes      Yes                  Acute           +---------+---------------+---------+-----------+----------+--------------+ PTV      Full                                                        +---------+---------------+---------+-----------+----------+--------------+ PERO     Full                                                        +---------+---------------+---------+-----------+----------+--------------+   +---------+---------------+---------+-----------+----------+--------------+ LEFT     CompressibilityPhasicitySpontaneityPropertiesThrombus Aging +---------+---------------+---------+-----------+----------+--------------+ CFV      Partial        Yes      Yes                  Acute          +---------+---------------+---------+-----------+----------+--------------+ SFJ      Partial  Acute          +---------+---------------+---------+-----------+----------+--------------+ FV Prox  None           No       No                   Acute          +---------+---------------+---------+-----------+----------+--------------+ FV Mid   None           No       No                   Acute          +---------+---------------+---------+-----------+----------+--------------+ FV DistalFull                                                        +---------+---------------+---------+-----------+----------+--------------+ PFV      Full                                                        +---------+---------------+---------+-----------+----------+--------------+ POP      Full           Yes      Yes                                 +---------+---------------+---------+-----------+----------+--------------+ PTV      Full                                                        +---------+---------------+---------+-----------+----------+--------------+ PERO     Full                                                         +---------+---------------+---------+-----------+----------+--------------+ EIV                     Yes      Yes                                 +---------+---------------+---------+-----------+----------+--------------+    Summary: RIGHT: - Findings consistent with acute deep vein thrombosis involving the right popliteal vein.  - No cystic structure found in the popliteal fossa.  LEFT: - Findings consistent with acute deep vein thrombosis involving the left common femoral vein, and left femoral vein.  - No cystic structure found in the popliteal fossa.  *See table(s) above for measurements and observations.    Preliminary    ECHOCARDIOGRAM COMPLETE Result Date: 10/26/2024    ECHOCARDIOGRAM REPORT   Patient Name:   Bryce Martinez Date of Exam: 10/26/2024 Medical Rec #:  [id number]          Height:       73.0 in Accession #:  [id number]         Weight:       165.8 lb Date of Birth:  [birth year]          BSA:          1.987 m Patient Age:    73 years           BP:           131/78 mmHg Patient Gender: M                  HR:           77 bpm. Exam Location:  Inpatient Procedure: 2D Echo, Cardiac Doppler and Color Doppler (Both Spectral and Color            Flow Doppler were utilized during procedure). Indications:    Pulmonary Embolus  History:        Patient has prior history of Echocardiogram examinations, most                 recent 09/28/2024. Risk Factors:Dyslipidemia and Hypertension.  Sonographer:    Sherlean Dubin Referring Phys: [id number] COREAN HERO REESE  Sonographer Comments: Image acquisition challenging due to respiratory motion. IMPRESSIONS  1. Left ventricular ejection fraction, by estimation, is 45 to 50%. The left ventricle has mildly decreased function. The left ventricle demonstrates global hypokinesis. Left ventricular diastolic parameters are consistent with Grade I diastolic dysfunction (impaired relaxation).  2. Right ventricular systolic function is normal. The right ventricular  size is mildly enlarged. There is normal pulmonary artery systolic pressure. The estimated right ventricular systolic pressure is 25.7 mmHg.  3. The mitral valve is normal in structure. No evidence of mitral valve regurgitation. No evidence of mitral stenosis.  4. The aortic valve is normal in structure. Aortic valve regurgitation is mild. No aortic stenosis is present.  5. The inferior vena cava is normal in size with greater than 50% respiratory variability, suggesting right atrial pressure of 3 mmHg. FINDINGS  Left Ventricle: Left ventricular ejection fraction, by estimation, is 45 to 50%. The left ventricle has mildly decreased function. The left ventricle demonstrates global hypokinesis. The left ventricular internal cavity size was normal in size. There is  no left ventricular hypertrophy. Left ventricular diastolic parameters are consistent with Grade I diastolic dysfunction (impaired relaxation). Right Ventricle: The right ventricular size is mildly enlarged. No increase in right ventricular wall thickness. Right ventricular systolic function is normal. There is normal pulmonary artery systolic pressure. The tricuspid regurgitant velocity is 2.38  m/s, and with an assumed right atrial pressure of 3 mmHg, the estimated right ventricular systolic pressure is 25.7 mmHg. Left Atrium: Left atrial size was normal in size. Right Atrium: Right atrial size was normal in size. Pericardium: There is no evidence of pericardial effusion. Mitral Valve: The mitral valve is normal in structure. No evidence of mitral valve regurgitation. No evidence of mitral valve stenosis. Tricuspid Valve: The tricuspid valve is normal in structure. Tricuspid valve regurgitation is mild . No evidence of tricuspid stenosis. Aortic Valve: The aortic valve is normal in structure. Aortic valve regurgitation is mild. No aortic stenosis is present. Aortic valve mean gradient measures 2.0 mmHg. Aortic valve peak gradient measures 3.2 mmHg. Aortic  valve area, by VTI measures 3.48 cm. Pulmonic Valve: The pulmonic valve was normal in structure. Pulmonic valve regurgitation is trivial. No evidence of pulmonic stenosis. Aorta: The aortic root is normal in size and structure. Venous: The inferior vena cava is normal in  size with greater than 50% respiratory variability, suggesting right atrial pressure of 3 mmHg. IAS/Shunts: No atrial level shunt detected by color flow Doppler.  LEFT VENTRICLE PLAX 2D LVIDd:         4.70 cm   Diastology LVIDs:         3.60 cm   LV e' medial:    7.30 cm/s LV PW:         0.90 cm   LV E/e' medial:  6.6 LV IVS:        1.20 cm   LV e' lateral:   7.77 cm/s LVOT diam:     2.10 cm   LV E/e' lateral: 6.2 LV SV:         61 LV SV Index:   31 LVOT Area:     3.46 cm  RIGHT VENTRICLE            IVC RV Basal diam:  3.70 cm    IVC diam: 1.40 cm RV Mid diam:    3.00 cm RV S prime:     9.79 cm/s  PULMONARY VEINS TAPSE (M-mode): 1.5 cm     Diastolic Velocity: 33.00 cm/s                            S/D Velocity:       1.40                            Systolic Velocity:  44.60 cm/s LEFT ATRIUM           Index        RIGHT ATRIUM           Index LA diam:      2.70 cm 1.36 cm/m   RA Area:     18.90 cm LA Vol (A2C): 61.4 ml 30.91 ml/m  RA Volume:   51.20 ml  25.77 ml/m LA Vol (A4C): 50.1 ml 25.22 ml/m  AORTIC VALVE AV Area (Vmax):    3.49 cm AV Area (Vmean):   3.30 cm AV Area (VTI):     3.48 cm AV Vmax:           89.55 cm/s AV Vmean:          61.800 cm/s AV VTI:            0.175 m AV Peak Grad:      3.2 mmHg AV Mean Grad:      2.0 mmHg LVOT Vmax:         90.27 cm/s LVOT Vmean:        58.950 cm/s LVOT VTI:          0.176 m LVOT/AV VTI ratio: 1.01  AORTA Ao Root diam: 3.30 cm Ao Asc diam:  3.40 cm MITRAL VALVE               TRICUSPID VALVE MV Area (PHT): 3.72 cm    TR Peak grad:   22.7 mmHg MV Decel Time: 204 msec    TR Vmax:        238.00 cm/s MV E velocity: 48.00 cm/s MV A velocity: 74.10 cm/s  SHUNTS MV E/A ratio:  0.65        Systemic VTI:   0.18 m                            Systemic  Diam: 2.10 cm Toribio Fuel MD Electronically signed by Toribio Fuel MD Signature Date/Time: 10/26/2024/11:33:58 AM    Final    CT Angio Chest PE W and/or Wo Contrast Result Date: 10/25/2024 EXAM: CTA of the Chest with contrast for PE 10/25/2024 TECHNIQUE: CTA of the chest was performed after the administration of intravenous contrast. Multiplanar reformatted images are provided for review. MIP images are provided for review. Automated exposure control, iterative reconstruction, and/or weight based adjustment of the mA/kV was utilized to reduce the radiation dose to as low as reasonably achievable. COMPARISON: 10/17/2024, 10/04/2024 CLINICAL HISTORY: FINDINGS: PULMONARY ARTERIES: Occlusive embolus within the distal right pulmonary artery with occlusive and nonocclusive emboli extending into the lobar and sigmoid branches of the right upper and right lower lobes. Distal segmental embolus also noted in the right middle lobe. Main pulmonary artery is normal in caliber. MEDIASTINUM: Right ventricular dilation with the RV to LV ratio greater than 1. Leftward shift of the cardiomediastinal structures. No cardiomegaly. There is no acute abnormality of the thoracic aorta. LYMPH NODES: No mediastinal, hilar or axillary lymphadenopathy. LUNGS AND PLEURA: Hazy ground glass opacities within the posterior right upper lobe, the right middle, and right lower lobes. Dense airspace consolidation within the periphery of the left upper lobe, lingula, and left lower lobe. Small left pleural effusion. Trace right pleural effusion. No pneumothorax. UPPER ABDOMEN: Limited images of the upper abdomen demonstrate cholecystectomy. No reflux of contrast into the hepatic veins. SOFT TISSUES AND BONES: Symmetric bilateral gynecomastia. Scattered cervical and thoracic osteophytosis. No acute bone or soft tissue abnormality. Critical value/emergent results were called by telephone at the time  of interpretation on 10/25/2024 at 6:28pm to Dr. Sharie, who verbally acknowledged these results. IMPRESSION: 1. Moderate volume occlusive and nonocclusive pulmonary embolus throughout the right lung with findings worrisome for right heart strain. 2. Hazy ground glass opacities in the posterior right upper lobe, right middle, and right lower lobes, with dense airspace consolidation in the periphery of the left upper lobe, lingula, and left lower lobe, worrisome for multifocal pneumonia. Given the differences in technique, this is similar to the prior chest radiograph. Small left pleural effusion and trace right pleural effusion. 3. Critical value/emergent results were called by telephone at the time of interpretation on 10/25/24 at 6:28pm to Dr. Sharie, who verbally acknowledged these results. Electronically signed by: Rogelia Myers MD 10/25/2024 06:38 PM EST RP Workstation: [id number]   DG Chest Port 1 View Result Date: 10/25/2024 CLINICAL DATA:  Shortness of breath. EXAM: PORTABLE CHEST 1 VIEW COMPARISON:  10/17/2024. FINDINGS: The heart size and mediastinal contours are stable. Increased airspace disease and consolidation is noted in the left lung. There is a trace right pleural effusion. No pneumothorax is seen. No acute osseous abnormality. IMPRESSION: 1. Interval worsening of left lung consolidation. 2. Small right pleural effusion. Electronically Signed   By: Leita Birmingham M.D.   On: 10/25/2024 15:55   Assessment and Plan:  Acute hypoxic respiratory failure Suspect multifactorial given, occlusive, nonocclusive PEs, multifocal pneumonia, recent COVID infection Admitted to PCCM service Attempted to wean off BiPAP to [facility], doing well  Further workup and evaluation per primary Continue to treat submassive pulmonary embolisms  Cardiomyopathy, suspect stress induced  Hypertension Home meds: Losartan  100 mg daily Recent admission which revealed reduced LVEF, it was suspected that this was in  the setting of critical illness myopathy/ARDS Echo 09/2024: LVEF 25 to 30% Echo this admission: LVEF improved 45 to 50%, global hypokinesis, G1 DD, normal RV systolic function,  normal PASP, mild aortic regurgitation Presented this admission with acute onset shortness of breath, hypoxic on RA CTPA showed: moderate volume occlusive and nonocclusive PE throughout the right lung, findings worrisome for right heart strain Euvolemic on exam  BP has been normotensive for the most part this admission Renal function remains normal Currently on PTA losartan  100 mg daily Patient lives near [place], [place] so we will ensure that there is appropriate follow-up with our office Suspect would likely benefit from repeat echocardiogram in future once recovered from acute illness, if there is recurrence of reduced EF or symptoms without other cause can pursue ischemic evaluation at that time  Hyperlipidemia 05/17/2024: HDL 59; LDL Chol Calc (NIH) 129 10/25/2024: ALT 55  Does not appear to be on any lipid-lowering agents presently Will address as an outpatient once stable  Per primary Acute hypoxic respiratory failure Submassive PE Left-sided multilobar pneumonia Diabetes Chronic anemia GERD Recent critical illness myopathy  Risk Assessment/Risk Scores:       For questions or updates, please contact [facility] Please consult [email or web address] for contact info under   Signed, Waddell DELENA Donath, PA-C  10/26/2024 3:17 PM     [1]  Allergies Allergen Reactions   Amlodipine  Other (See Comments)    Swelling of feet   "

## 2024-10-26 NOTE — Consult Note (Addendum)
 Pharmacy Consult Note - Anticoagulation  Pharmacy Consult for heparin  infusion Indication: pulmonary embolus Allergies[1]  PATIENT MEASUREMENTS: Height: 6' 1 (185.4 cm) Weight: 73 kg (161 lb) IBW/kg (Calculated) : 79.9 HEPARIN  DW (KG): 73  VITAL SIGNS: Temp: 98.1 F (36.7 C) (12/23 0330) Temp Source: Oral (12/23 0330) BP: 136/83 (12/23 0300) Pulse Rate: 82 (12/23 0300)  Recent Labs    10/25/24 1527 10/25/24 1529 10/26/24 0259  HGB 10.6* 12.9*  --   HCT 32.6* 38.0*  --   PLT 258  --   --   APTT 36  --   --   LABPROT 15.8*  --   --   INR 1.2  --   --   HEPARINUNFRC  --   --  0.20*  CREATININE 0.90 1.10  --     Estimated Creatinine Clearance: 62.7 mL/min (by C-G formula based on SCr of 1.1 mg/dL).  PAST MEDICAL HISTORY: Past Medical History:  Diagnosis Date   Allergy    Arthritis    ED (erectile dysfunction)    GERD (gastroesophageal reflux disease)    Occasionally   Hepatitis C    Hyperlipidemia    Hypertension    ASSESSMENT: 73 y.o. male with PMH HTN, CHF, HLD and recent ARDS is presenting with PE. Patient is not on chronic anticoagulation per chart review. Pharmacy has been consulted to initiate and manage heparin  intravenous infusion.  AM: heparin  level below goal on 1200 units/hr. Per RN, no issues with heparin  running continuously or signs/symptoms of bleeding.  Goal(s) of therapy: Heparin  level 0.3 - 0.7 units/mL Monitor platelets by anticoagulation protocol: Yes   PLAN: Give 1500 units bolus x1; then increase heparin  infusion to 1,400 units/hour. Check heparin  level in 8 hours Monitor heparin  level and CBC daily while on heparin  infusion.  Lynwood Poplar, PharmD, BCPS Clinical Pharmacist 10/26/2024 4:40 AM      [1]  Allergies Allergen Reactions   Amlodipine  Other (See Comments)    Swelling of feet

## 2024-10-26 NOTE — Progress Notes (Signed)
 Bilateral lower extremity venous duplex has been completed. Preliminary results can be found in CV Proc through chart review.  Results were given to Corean Reese PA.  10/26/2024 11:20 AM Cathlyn Collet RVT

## 2024-10-26 NOTE — Telephone Encounter (Signed)
 Pharmacy Patient Advocate Encounter  Insurance verification completed.    The patient is insured through [facility]. Patient has Medicare and is not eligible for a copay card, but may be able to apply for patient assistance or Medicare RX Payment Plan (Patient Must reach out to their plan, if eligible for payment plan), if available.    Ran test claim for Eliquis  5mg  tablet and the current 30 day co-pay is $176.39.   This test claim was processed through [facility]- copay amounts may vary at other pharmacies due to pharmacy/plan contracts, or as the patient moves through the different stages of their insurance plan.

## 2024-10-26 NOTE — Consult Note (Signed)
 Pharmacy Consult Note - Anticoagulation  Pharmacy Consult:  Heparin  Indication: pulmonary embolus  Allergies[1]  PATIENT MEASUREMENTS: Height: 6' 1 (185.4 cm) Weight: 75.2 kg (165 lb 12.6 oz) IBW/kg (Calculated) : 79.9 HEPARIN  DW (KG): 73  VITAL SIGNS: Temp: 98.3 F (36.8 C) (12/23 1121) Temp Source: Oral (12/23 1121) BP: 127/85 (12/23 1300) Pulse Rate: 75 (12/23 1300)  Recent Labs    10/25/24 1527 10/25/24 1527 10/25/24 1529 10/26/24 0259 10/26/24 1219  HGB 10.6*  --  12.9* 10.3*  --   HCT 32.6*  --  38.0* 31.1*  --   PLT 258  --   --  270  --   APTT 36  --   --   --   --   LABPROT 15.8*  --   --   --   --   INR 1.2  --   --   --   --   HEPARINUNFRC  --    < >  --  0.20* 0.23*  CREATININE 0.90  --  1.10  --   --    < > = values in this interval not displayed.    Estimated Creatinine Clearance: 64.6 mL/min (by C-G formula based on SCr of 1.1 mg/dL).   ASSESSMENT: 73 y.o. male with PMH HTN, CHF, HLD and recent ARDS is presenting with PE.  Pharmacy has been consulted to manage IV heparin   Heparin  level sub-therapeutic at 0.23 units/mL on 1400 units/hr.  No issue with heparin  infusion nor bleeding per discussion with RN.  CBC stable.  Goal of therapy: Heparin  level 0.3 - 0.7 units/mL Monitor platelets by anticoagulation protocol: Yes   PLAN: Increase IV heparin  to 1650 units/hr Check 8 hr heparin  level Daily heparin  level and CBC  Christo Hain D. Lendell, PharmD, BCPS, BCCCP 10/26/2024, 1:21 PM     [1]  Allergies Allergen Reactions   Amlodipine  Other (See Comments)    Swelling of feet

## 2024-10-26 NOTE — Progress Notes (Signed)
 "  NAME:  Bryce Martinez, MRN:  [id number], DOB:  05-29-1951, LOS: 1 ADMISSION DATE:  10/25/2024, CONSULTATION DATE:  10/25/2024 REFERRING MD:  [facility]  CHIEF COMPLAINT:  Dyspnea   History of Present Illness:  73 year old man who presented to [facility] 12/22 as a transfer from [facility] for dyspnea secondary to PE. 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 Recent admission to [facility] 11/24-12/9 for ARDS secondary to aspiration pneumonia, requiring mechanical ventilation 11/24-12/1.  During that admission, found to have systolic CHF with Echo revealing LVEF 25 to 30% and global hypokinesis; also incidentally found to be COVID-positive. Due to generalized deconditioning/critical illness myopathy, he was discharged to CIR on 12/9 and remained admitted there through 12/15.  On 12/22, patient presented to urgent care with dyspnea, primarily with exertion. Found to have O2 sats of 64% on RA  Patient had recently seen his PCP 12/19 and was diagnosed with a LLL PNA for which was started on a Z-Pak.  Due to his significant hypoxia, he was referred to [facility] ED for further evaluation and management.  In the ED, patient was noted to be hypoxic and was placed on 4L nasal cannula before being upgraded to BiPAP.  ProBNP noted to be elevated at 2291, troponin flat at 77, D-dimer > 20.  CXR demonstrated left lung consolidation and small right pleural effusion.  CTA Chest demonstrated moderate volume occlusive and non-occlusive PE throughout the right lung with some concern for RHS, GGOs opacity RUL, RML, RLL with dense consolidation on left.  In the setting of need for BiPAP with new PE, PCCM was consulted for ICU admission and transfer to [facility].  Pertinent Medical History:   Past Medical History:  Diagnosis Date   Allergy    Arthritis    ED (erectile dysfunction)    GERD (gastroesophageal reflux disease)    Occasionally   Hepatitis C     Hyperlipidemia    Hypertension    Significant Hospital Events: Including procedures, antibiotic start and stop dates in addition to other pertinent events   12/22 Admit  Interim History / Subjective:  Overnight on BiPAP. Transitioned to 6L O2  Objective:  Blood pressure 131/78, pulse 78, temperature 98.4 F (36.9 C), temperature source Oral, resp. rate (!) 30, height 6' 1 (1.854 m), weight 75.2 kg, SpO2 97%.    FiO2 (%):  [40 %] 40 % PEEP:  [5 cmH20-6 cmH20] 6 cmH20   Intake/Output Summary (Last 24 hours) at 10/26/2024 0929 Last data filed at 10/26/2024 0900 Gross per 24 hour  Intake 1225.14 ml  Output 500 ml  Net 725.14 ml   Filed Weights   10/25/24 1429 10/26/24 0600  Weight: 73 kg 75.2 kg   Physical Exam: General: Chronically ill-appearing, no acute distress HENT: [place], AT, OP clear, MMM Eyes: EOMI, no scleral icterus Respiratory: Diminished on left base otherwise clear to auscultation bilaterally.  No crackles, wheezing or rales Cardiovascular: RRR, -M/R/G, no JVD GI: BS+, soft, nontender Extremities:-Edema,-tenderness Neuro: AAO x4, CNII-XII grossly intact Skin: Intact, no rashes or bruising Psych: Normal mood, normal affect GU: Foley in place  WBC 12.4, slightly increased from 11.8  Labs/imaging personally reviewed:  CTA Chest 12/22 > CTA chest demonstrated moderate volume occlusive and non-occlusive PE throughout the right lung with some concern for RHS, GGOs opacity RUL, RML, RLL with dense consolidation on left Echo 12/23 >   Assessment & Plan:   Acute hypoxic respiratory failure -  improving Acute submassive pulmonary embolism LLL PNA - concerning for HCAP vs recurrent aspiration Trop 77 > 77, pBNP 2291. CTA demonstrating moderate volume occlusive/nonocclusive PE throughout the R lung with evidence of R heart strain. PESI Score 92 points (Class III, Intermediate Risk) - Per IR, not a candidate for  thrombectomy - Wean HFNC for goal >90% - De-escalate  ceftriaxone  and doxycycline  - Heparin  gtt. Transition to PO DOAC closer to discharge - F/u Echo - F/u LE Dopplers  Hx HTN, HLD, HFrEF Echo 09/28/24 with LVEF 25-30%, global HK, normal RV. - Restarted Losartan  - Consulted Cardiology to evaluate inpatient vs outpatient    Chronic anemia - Trend H&H - Monitor for signs of active bleeding - Transfuse for Hgb < 7.0 or hemodynamically significant bleeding   DMT2 - SSI - CBGs Q4H - Goal CBG 140-180  GERD - PPI   Deconditioning, recent critical illness myopathy - PT/OT/SLP as clinically appropriate  Critical care time:   Care Time: 50 min  Slater Staff, M.D. [facility] Pulmonary/Critical Care Medicine 10/26/2024 9:29 AM   Please see Amion for pager number to reach on-call Pulmonary and Critical Care Team.   "

## 2024-10-26 NOTE — Progress Notes (Signed)
 eLink Physician-Brief Progress Note Patient Name: Bryce Martinez DOB: 1951-01-03 MRN: [id number]   Date of Service  10/26/2024  HPI/Events of Note  Bilateral lower extremity DVTs, complaining of 8 out of 10 foot pain, no chronic opiates, no analgesics ordered  eICU Interventions  Order acetaminophen  and Tylenol  sliding scale for management of pain       Intervention Category Intermediate Interventions: Pain - evaluation and management Minor Interventions: Routine modifications to care plan (e.g. PRN medications for pain, fever)  Rodel Glaspy 10/26/2024, 9:57 PM

## 2024-10-27 LAB — CBC
HCT: 27.7 % — ABNORMAL LOW (ref 39.0–52.0)
Hemoglobin: 9.2 g/dL — ABNORMAL LOW (ref 13.0–17.0)
MCH: 28 pg (ref 26.0–34.0)
MCHC: 33.2 g/dL (ref 30.0–36.0)
MCV: 84.5 fL (ref 80.0–100.0)
Platelets: 278 K/uL (ref 150–400)
RBC: 3.28 MIL/uL — ABNORMAL LOW (ref 4.22–5.81)
RDW: 14.4 % (ref 11.5–15.5)
WBC: 11.3 K/uL — ABNORMAL HIGH (ref 4.0–10.5)
nRBC: 0 % (ref 0.0–0.2)

## 2024-10-27 LAB — BASIC METABOLIC PANEL WITH GFR
Anion gap: 8 (ref 5–15)
BUN: 10 mg/dL (ref 8–23)
CO2: 24 mmol/L (ref 22–32)
Calcium: 8.5 mg/dL — ABNORMAL LOW (ref 8.9–10.3)
Chloride: 102 mmol/L (ref 98–111)
Creatinine, Ser: 0.82 mg/dL (ref 0.61–1.24)
GFR, Estimated: >60 mL/min
Glucose, Bld: 91 mg/dL (ref 70–99)
Potassium: 3.9 mmol/L (ref 3.5–5.1)
Sodium: 134 mmol/L — ABNORMAL LOW (ref 135–145)

## 2024-10-27 LAB — GLUCOSE, CAPILLARY: Glucose-Capillary: 87 mg/dL (ref 70–99)

## 2024-10-27 LAB — HEPARIN LEVEL (UNFRACTIONATED)
Heparin Unfractionated: 0.44 [IU]/mL (ref 0.30–0.70)
Heparin Unfractionated: 0.51 [IU]/mL (ref 0.30–0.70)

## 2024-10-27 LAB — PROCALCITONIN: Procalcitonin: <0.1 ng/mL

## 2024-10-27 MED ORDER — GUAIFENESIN 100 MG/5ML PO LIQD
10.0000 mL | Freq: Once | ORAL | Status: AC
  Administered 2024-10-27: 10 mL via ORAL
  Filled 2024-10-27: qty 15

## 2024-10-27 NOTE — Plan of Care (Signed)
  Problem: Education: Goal: Knowledge of General Education information will improve Description: Including pain rating scale, medication(s)/side effects and non-pharmacologic comfort measures Outcome: Progressing   Problem: Clinical Measurements: Goal: Ability to maintain clinical measurements within normal limits will improve Outcome: Progressing Goal: Diagnostic test results will improve Outcome: Progressing Goal: Respiratory complications will improve Outcome: Progressing Goal: Cardiovascular complication will be avoided Outcome: Progressing   Problem: Activity: Goal: Risk for activity intolerance will decrease Outcome: Progressing   Problem: Nutrition: Goal: Adequate nutrition will be maintained Outcome: Progressing   Problem: Coping: Goal: Level of anxiety will decrease Outcome: Progressing   Problem: Elimination: Goal: Will not experience complications related to urinary retention Outcome: Progressing   Problem: Pain Managment: Goal: General experience of comfort will improve and/or be controlled Outcome: Progressing   Problem: Safety: Goal: Ability to remain free from injury will improve Outcome: Progressing   Problem: Skin Integrity: Goal: Risk for impaired skin integrity will decrease Outcome: Progressing

## 2024-10-27 NOTE — Consult Note (Signed)
 Pharmacy Consult Note - Anticoagulation  Pharmacy Consult:  Heparin  Indication: pulmonary embolus  Allergies[1]  PATIENT MEASUREMENTS: Height: 6' 1 (185.4 cm) Weight: 75.2 kg (165 lb 12.6 oz) IBW/kg (Calculated) : 79.9 HEPARIN  DW (KG): 73  VITAL SIGNS: Temp: 99.5 F (37.5 C) (12/24 0739) Temp Source: Oral (12/24 0739) BP: 122/72 (12/24 0700) Pulse Rate: 79 (12/24 0739)  Recent Labs    10/25/24 1527 10/25/24 1529 10/27/24 0136 10/27/24 0759  HGB 10.6*   < > 9.2*  --   HCT 32.6*   < > 27.7*  --   PLT 258   < > 278  --   APTT 36  --   --   --   LABPROT 15.8*  --   --   --   INR 1.2  --   --   --   HEPARINUNFRC  --    < > 0.44 0.51  CREATININE 0.90   < > 0.82  --    < > = values in this interval not displayed.    Estimated Creatinine Clearance: 86.6 mL/min (by C-G formula based on SCr of 0.82 mg/dL).   ASSESSMENT: 73 y.o. male with PMH HTN, CHF, HLD and recent ARDS is presenting with PE.  Pharmacy has been consulted to manage IV heparin   Heparin  level therapeutic.  No bleeding reported.  H/H trending down, platelet count stable.  Goal of therapy: Heparin  level 0.3 - 0.7 units/mL Monitor platelets by anticoagulation protocol: Yes   PLAN: Continue IV heparin  at 1650 units/hr Daily heparin  level and CBC F/u with transitioning to DOAC  Tonee Silverstein D. Lendell, PharmD, BCPS, BCCCP 10/27/2024, 9:11 AM      [1]  Allergies Allergen Reactions   Amlodipine  Other (See Comments)    Swelling of feet

## 2024-10-27 NOTE — Progress Notes (Addendum)
 " PROGRESS NOTE    Bryce Martinez  FMW:[id number] DOB: 1951-07-25 DOA: 10/25/2024 PCP: [facility]  Chief Complaint  Patient presents with   Shortness of Breath    Brief Narrative:   73 year old man with hx HTN, dyslipidemia, HFrEF, hepatitis C, and other medical issues who presented to [facility] 12/22 as Bryce Martinez transfer from [facility] for dyspnea secondary to PE.   Recent admission to [facility] 11/24-12/9 for ARDS secondary to aspiration pneumonia, requiring mechanical ventilation 11/24-12/1.  During that admission, found to have systolic CHF with Echo revealing LVEF 25 to 30% and global hypokinesis; also incidentally found to be COVID-positive. Due to generalized deconditioning/critical illness myopathy, he was discharged to CIR on 12/9 and remained admitted there through 12/15.   On 12/22, patient presented to urgent care with dyspnea, primarily with exertion. Found to have O2 sats of 64% on RA  Patient had recently seen his PCP 12/19 and was diagnosed with Shaneika Rossa LLL PNA for which was started on [name] Z-Pak.  Due to his significant hypoxia, he was referred to [facility] ED for further evaluation and management.   In the ED, patient was noted to be hypoxic and was placed on 4L nasal cannula before being upgraded to BiPAP.  ProBNP noted to be elevated at 2291, troponin flat at 77, D-dimer > 20.  CXR demonstrated left lung consolidation and small right pleural effusion.  CTA Chest demonstrated moderate volume occlusive and non-occlusive PE throughout the right lung with some concern for RHS, GGOs opacity RUL, RML, RLL with dense consolidation on left.   In the setting of need for BiPAP with new PE, PCCM was consulted for ICU admission and transfer to [facility].  He's improved with anticoagulation for acute PE as well as abx for pneumonia.   He's been transferred to the hospitalists service on 10/2024.    Assessment & Plan:   Principal Problem:   Acute hypoxic respiratory failure (HCC) Active Problems:    Acute pulmonary embolism with acute cor pulmonale (HCC)   Chronic systolic heart failure (HCC)  Acute Hypoxic Respiratory Failure In setting of PE and pneumonia  Acute Pulmonary Embolus  Submassive Pulmonary Embolism Bilateral LE DVT CT with moderate volume occlusive and nonocclusive PE throughout R lung with findings concerning for R heart strain US  with DVT of R popliteal vein and L common femoral and left femoral vein Echo with normal RVSF, mildly enlarged RV.  EF 45-50%, global hypokinesis.   Continue heparin  gtt for now Hopefully can transition to eliquis  soon (in next 24 hrs) Per PCCM, not thrombectomy candidate per IR His VTE appears to have been provoked in the setting of his recent extended hospitalization (11/24-12/9 + inpatient rehab stay), COVID 19 infection.  I think 3-6 months anticoagulation likely to be adequate, but would review his persistent risk factors with PCP outpatient prior to discontinuing.  Multifocal Pneumonia CT with findings c/w multifocal pneumonia Negative MRSA PCR Negative blood cultures to date Not coughing up enough for sputum cx Follow SLP eval  Ceftriaxone , doxy  Stress Induced Cardiomyopathy HFrecEF (EF 25-30% 09/2024 -> 45-50% 10/2024) Appreciate cardiology assistance - suspect cardiomyopathy related to critical illness, recommending continuing losartan .  They arranged follow up in [place].  Hold off on SGLT2 inhibitor for now.   Hypertension Losartan   GERD PPI  Insomnia Trazodone     DVT prophylaxis: heparin  gtt Code Status: full Family Communication: wife at bedside Disposition:   Status is: Inpatient Remains inpatient appropriate because: need for continued inpatient care  Consultants:  Cardiology PCCM  Procedures:  LE US  Summary:  RIGHT:  - Findings consistent with acute deep vein thrombosis involving the right  popliteal vein.       - No cystic structure found in the popliteal fossa.    LEFT:  - Findings  consistent with acute deep vein thrombosis involving the left  common femoral vein, and left femoral vein.       - No cystic structure found in the popliteal fossa.   Echo IMPRESSIONS     1. Left ventricular ejection fraction, by estimation, is 45 to 50%. The  left ventricle has mildly decreased function. The left ventricle  demonstrates global hypokinesis. Left ventricular diastolic parameters are  consistent with Grade I diastolic  dysfunction (impaired relaxation).   2. Right ventricular systolic function is normal. The right ventricular  size is mildly enlarged. There is normal pulmonary artery systolic  pressure. The estimated right ventricular systolic pressure is 25.7 mmHg.   3. The mitral valve is normal in structure. No evidence of mitral valve  regurgitation. No evidence of mitral stenosis.   4. The aortic valve is normal in structure. Aortic valve regurgitation is  mild. No aortic stenosis is present.   5. The inferior vena cava is normal in size with greater than 50%  respiratory variability, suggesting right atrial pressure of 3 mmHg.     Antimicrobials:  Anti-infectives (From admission, onward)    Start     Dose/Rate Route Frequency Ordered Stop   10/26/24 1700  azithromycin  (ZITHROMAX ) 500 mg in sodium chloride  0.9 % 250 mL IVPB  Status:  Discontinued        500 mg 250 mL/hr over 60 Minutes Intravenous Every 24 hours 10/26/24 0935 10/26/24 0939   10/26/24 1600  cefTRIAXone  (ROCEPHIN ) 1 g in sodium chloride  0.9 % 100 mL IVPB        1 g 200 mL/hr over 30 Minutes Intravenous Every 24 hours 10/26/24 0935 10/30/24 1559   10/26/24 1300  doxycycline  (VIBRAMYCIN ) 100 mg in sodium chloride  0.9 % 250 mL IVPB  Status:  Discontinued        100 mg 125 mL/hr over 120 Minutes Intravenous Every 12 hours 10/26/24 0939 10/26/24 0948   10/26/24 1100  doxycycline  (VIBRA -TABS) tablet 100 mg        100 mg Oral Every 12 hours 10/26/24 0948 10/30/24 0959   10/26/24 0500  vancomycin   (VANCOCIN ) IVPB 1000 mg/200 mL premix  Status:  Discontinued       Placed in Followed by Linked Group   1,000 mg 200 mL/hr over 60 Minutes Intravenous Every 12 hours 10/25/24 1631 10/26/24 0945   10/26/24 0000  ceFEPIme  (MAXIPIME ) 2 g in sodium chloride  0.9 % 100 mL IVPB  Status:  Discontinued        2 g 200 mL/hr over 30 Minutes Intravenous Every 8 hours 10/25/24 2240 10/26/24 0935   10/25/24 1700  vancomycin  (VANCOREADY) IVPB 1500 mg/300 mL       Placed in Followed by Linked Group   1,500 mg 150 mL/hr over 120 Minutes Intravenous  Once 10/25/24 1631 10/25/24 1934   10/25/24 1630  ceFEPIme  (MAXIPIME ) 2 g in sodium chloride  0.9 % 100 mL IVPB        2 g 200 mL/hr over 30 Minutes Intravenous  Once 10/25/24 1623 10/25/24 1720   10/25/24 1630  azithromycin  (ZITHROMAX ) 500 mg in sodium chloride  0.9 % 250 mL IVPB        500 mg  250 mL/hr over 60 Minutes Intravenous  Once 10/25/24 1623 10/25/24 1750       Subjective: Feels less SOB No new complaints Hasn't been up yet  Objective: Vitals:   10/27/24 0500 10/27/24 0600 10/27/24 0700 10/27/24 0739  BP: 110/69 128/74 122/72   Pulse: 68 67 64 79  Resp: (!) 21 (!) 21 (!) 22 (!) 31  Temp:    99.5 F (37.5 C)  TempSrc:    Oral  SpO2: (!) 88% 98% 93% (!) 87%  Weight:      Height:        Intake/Output Summary (Last 24 hours) at 10/27/2024 1031 Last data filed at 10/27/2024 0853 Gross per 24 hour  Intake 1865.85 ml  Output 1900 ml  Net -34.15 ml   Filed Weights   10/25/24 1429 10/26/24 0600  Weight: 73 kg 75.2 kg    Examination:  General exam: Appears calm and comfortable  Respiratory system: rhonchi at L base, unlabored on 2-3 L Cardiovascular system: RRR Gastrointestinal system: Abdomen is nondistended, soft and nontender.  Central nervous system: Alert and oriented. No focal neurological deficits. Extremities: no LEE  Data Reviewed: I have personally reviewed following labs and imaging studies  CBC: Recent Labs   Lab 10/22/24 0857 10/25/24 1527 10/25/24 1529 10/26/24 0259 10/27/24 0136  WBC 9.1 11.8*  --  12.4* 11.3*  NEUTROABS 6.6 8.8*  --   --   --   HGB 11.0* 10.6* 12.9* 10.3* 9.2*  HCT 34.0* 32.6* 38.0* 31.1* 27.7*  MCV 87 85.8  --  84.5 84.5  PLT 291 258  --  270 278    Basic Metabolic Panel: Recent Labs  Lab 10/22/24 0857 10/25/24 1527 10/25/24 1529 10/27/24 0136  NA 134 134* 136 134*  K 4.6 4.0 4.0 3.9  CL 99 100 99 102  CO2 20 23  --  24  GLUCOSE 92 113* 117* 91  BUN 8 8 7* 10  CREATININE 1.00 0.90 1.10 0.82  CALCIUM  9.1 9.1  --  8.5*    GFR: Estimated Creatinine Clearance: 86.6 mL/min (by C-G formula based on SCr of 0.82 mg/dL).  Liver Function Tests: Recent Labs  Lab 10/25/24 1527  AST 25  ALT 55*  ALKPHOS 81  BILITOT 0.4  PROT 7.3  ALBUMIN 2.9*    CBG: Recent Labs  Lab 10/25/24 2101 10/26/24 1922 10/27/24 0741  GLUCAP 81 137* 87     Recent Results (from the past 240 hours)  Resp panel by RT-PCR (RSV, Flu Ezana Hubbert&B, Covid) Anterior Nasal Swab     Status: None   Collection Time: 10/25/24  2:55 PM   Specimen: Anterior Nasal Swab  Result Value Ref Range Status   SARS Coronavirus 2 by RT PCR NEGATIVE NEGATIVE Final    Comment: (NOTE) SARS-CoV-2 target nucleic acids are NOT DETECTED.  The SARS-CoV-2 RNA is generally detectable in upper respiratory specimens during the acute phase of infection. The lowest concentration of SARS-CoV-2 viral copies this assay can detect is 138 copies/mL. Versa Craton negative result does not preclude SARS-Cov-2 infection and should not be used as the sole basis for treatment or other patient management decisions. Bettey Muraoka negative result may occur with  improper specimen collection/handling, submission of specimen other than nasopharyngeal swab, presence of viral mutation(s) within the areas targeted by this assay, and inadequate number of viral copies(<138 copies/mL). Miley Blanchett negative result must be combined with clinical observations, patient  history, and epidemiological information. The expected result is Negative.  Fact Sheet for Patients:  bloggercourse.com  Fact Sheet for Healthcare Providers:  seriousbroker.it  This test is no t yet approved or cleared by the [place]  FDA and  has been authorized for detection and/or diagnosis of SARS-CoV-2 by FDA under an Emergency Use Authorization (EUA). This EUA will remain  in effect (meaning this test can be used) for the duration of the COVID-19 declaration under Section 564(b)(1) of the Act, 21 U.S.C.section 360bbb-3(b)(1), unless the authorization is terminated  or revoked sooner.       Influenza Branda Chaudhary by PCR NEGATIVE NEGATIVE Final   Influenza B by PCR NEGATIVE NEGATIVE Final    Comment: (NOTE) The Xpert Xpress SARS-CoV-2/FLU/RSV plus assay is intended as an aid in the diagnosis of influenza from Nasopharyngeal swab specimens and should not be used as Kanita Delage sole basis for treatment. Nasal washings and aspirates are unacceptable for Xpert Xpress SARS-CoV-2/FLU/RSV testing.  Fact Sheet for Patients: bloggercourse.com  Fact Sheet for Healthcare Providers: seriousbroker.it  This test is not yet approved or cleared by the [place]  FDA and has been authorized for detection and/or diagnosis of SARS-CoV-2 by FDA under an Emergency Use Authorization (EUA). This EUA will remain in effect (meaning this test can be used) for the duration of the COVID-19 declaration under Section 564(b)(1) of the Act, 21 U.S.C. section 360bbb-3(b)(1), unless the authorization is terminated or revoked.     Resp Syncytial Virus by PCR NEGATIVE NEGATIVE Final    Comment: (NOTE) Fact Sheet for Patients: bloggercourse.com  Fact Sheet for Healthcare Providers: seriousbroker.it  This test is not yet approved or cleared by the [place]  FDA  and has been authorized for detection and/or diagnosis of SARS-CoV-2 by FDA under an Emergency Use Authorization (EUA). This EUA will remain in effect (meaning this test can be used) for the duration of the COVID-19 declaration under Section 564(b)(1) of the Act, 21 U.S.C. section 360bbb-3(b)(1), unless the authorization is terminated or revoked.  Performed at [facility], [address] [place], [place] [address]   Culture, blood (routine x 2)     Status: None (Preliminary result)   Collection Time: 10/25/24  3:27 PM   Specimen: BLOOD  Result Value Ref Range Status   Specimen Description BLOOD BLOOD LEFT ARM LAC  Final   Special Requests   Final    Blood Culture results may not be optimal due to an inadequate volume of blood received in culture bottles   Culture   Final    NO GROWTH < 24 HOURS Performed at [facility], [address] [place], [place] [address]    Report Status PENDING  Incomplete  Culture, blood (routine x 2)     Status: None (Preliminary result)   Collection Time: 10/25/24  3:27 PM   Specimen: BLOOD  Result Value Ref Range Status   Specimen Description BLOOD BLOOD RIGHT ARM RAC  Final   Special Requests Blood Culture adequate volume  Final   Culture   Final    NO GROWTH < 24 HOURS Performed at [facility], [address] [place], [place] [address]    Report Status PENDING  Incomplete  MRSA Next Gen by PCR, Nasal     Status: None   Collection Time: 10/26/24  7:18 AM   Specimen: Nasal Mucosa; Nasal Swab  Result Value Ref Range Status   MRSA by PCR Next Gen NOT DETECTED NOT DETECTED Final    Comment: (NOTE) The GeneXpert MRSA Assay (FDA approved for NASAL specimens only), is one component of Lyric Hoar comprehensive MRSA  colonization surveillance program. It is not intended to diagnose MRSA infection nor to guide or monitor treatment for MRSA infections. Test performance is not FDA approved in patients less than 72 years old. Performed at [facility],  [address], [place], [place] [address]          Radiology Studies: VAS US  LOWER EXTREMITY VENOUS (DVT) Result Date: 10/26/2024  Lower Venous DVT Study Patient Name:  WITTEN CERTAIN  Date of Exam:   10/26/2024 Medical Rec #: [id number]           Accession #:    [id number] Date of Birth: 09-29-1951           Patient Gender: M Patient Age:   3 years Exam Location:  [facility] Procedure:      VAS US  LOWER EXTREMITY VENOUS (DVT) Referring Phys: STEPHANIE REESE --------------------------------------------------------------------------------  Indications: Pulmonary embolism.  Risk Factors: Confirmed PE. Anticoagulation: Heparin . Comparison Study: No prior studies. Performing Technologist: Cordella Collet RVT  Examination Guidelines: Luisa Louk complete evaluation includes B-mode imaging, spectral Doppler, color Doppler, and power Doppler as needed of all accessible portions of each vessel. Bilateral testing is considered an integral part of Mekiah Cambridge complete examination. Limited examinations for reoccurring indications may be performed as noted. The reflux portion of the exam is performed with the patient in reverse Trendelenburg.  +---------+---------------+---------+-----------+----------+--------------+ RIGHT    CompressibilityPhasicitySpontaneityPropertiesThrombus Aging +---------+---------------+---------+-----------+----------+--------------+ CFV      Full           Yes      Yes                                 +---------+---------------+---------+-----------+----------+--------------+ SFJ      Full                                                        +---------+---------------+---------+-----------+----------+--------------+ FV Prox  Full                                                        +---------+---------------+---------+-----------+----------+--------------+ FV Mid   Full                                                         +---------+---------------+---------+-----------+----------+--------------+ FV DistalFull                                                        +---------+---------------+---------+-----------+----------+--------------+ PFV      Full                                                        +---------+---------------+---------+-----------+----------+--------------+  POP      Partial        Yes      Yes                  Acute          +---------+---------------+---------+-----------+----------+--------------+ PTV      Full                                                        +---------+---------------+---------+-----------+----------+--------------+ PERO     Full                                                        +---------+---------------+---------+-----------+----------+--------------+   +---------+---------------+---------+-----------+----------+--------------+ LEFT     CompressibilityPhasicitySpontaneityPropertiesThrombus Aging +---------+---------------+---------+-----------+----------+--------------+ CFV      Partial        Yes      Yes                  Acute          +---------+---------------+---------+-----------+----------+--------------+ SFJ      Partial                                      Acute          +---------+---------------+---------+-----------+----------+--------------+ FV Prox  None           No       No                   Acute          +---------+---------------+---------+-----------+----------+--------------+ FV Mid   None           No       No                   Acute          +---------+---------------+---------+-----------+----------+--------------+ FV DistalFull                                                        +---------+---------------+---------+-----------+----------+--------------+ PFV      Full                                                         +---------+---------------+---------+-----------+----------+--------------+ POP      Full           Yes      Yes                                 +---------+---------------+---------+-----------+----------+--------------+ PTV      Full                                                        +---------+---------------+---------+-----------+----------+--------------+  PERO     Full                                                        +---------+---------------+---------+-----------+----------+--------------+ EIV                     Yes      Yes                                 +---------+---------------+---------+-----------+----------+--------------+     Summary: RIGHT: - Findings consistent with acute deep vein thrombosis involving the right popliteal vein.  - No cystic structure found in the popliteal fossa.  LEFT: - Findings consistent with acute deep vein thrombosis involving the left common femoral vein, and left femoral vein.  - No cystic structure found in the popliteal fossa.  *See table(s) above for measurements and observations. Electronically signed by Lonni Gaskins MD on 10/26/2024 at 5:00:33 PM.    Final    ECHOCARDIOGRAM COMPLETE Result Date: 10/26/2024    ECHOCARDIOGRAM REPORT   Patient Name:   KINGSLY KLOEPFER Date of Exam: 10/26/2024 Medical Rec #:  [id number]          Height:       73.0 in Accession #:    [id number]         Weight:       165.8 lb Date of Birth:  July 10, 1951          BSA:          1.987 m Patient Age:    73 years           BP:           131/78 mmHg Patient Gender: M                  HR:           77 bpm. Exam Location:  Inpatient Procedure: 2D Echo, Cardiac Doppler and Color Doppler (Both Spectral and Color            Flow Doppler were utilized during procedure). Indications:    Pulmonary Embolus  History:        Patient has prior history of Echocardiogram examinations, most                 recent 09/28/2024. Risk Factors:Dyslipidemia and  Hypertension.  Sonographer:    Sherlean Dubin Referring Phys: [id number] COREAN HERO REESE  Sonographer Comments: Image acquisition challenging due to respiratory motion. IMPRESSIONS  1. Left ventricular ejection fraction, by estimation, is 45 to 50%. The left ventricle has mildly decreased function. The left ventricle demonstrates global hypokinesis. Left ventricular diastolic parameters are consistent with Grade I diastolic dysfunction (impaired relaxation).  2. Right ventricular systolic function is normal. The right ventricular size is mildly enlarged. There is normal pulmonary artery systolic pressure. The estimated right ventricular systolic pressure is 25.7 mmHg.  3. The mitral valve is normal in structure. No evidence of mitral valve regurgitation. No evidence of mitral stenosis.  4. The aortic valve is normal in structure. Aortic valve regurgitation is mild. No aortic stenosis is present.  5. The inferior vena cava is normal in size with greater than 50% respiratory variability, suggesting right atrial pressure of 3 mmHg.  FINDINGS  Left Ventricle: Left ventricular ejection fraction, by estimation, is 45 to 50%. The left ventricle has mildly decreased function. The left ventricle demonstrates global hypokinesis. The left ventricular internal cavity size was normal in size. There is  no left ventricular hypertrophy. Left ventricular diastolic parameters are consistent with Grade I diastolic dysfunction (impaired relaxation). Right Ventricle: The right ventricular size is mildly enlarged. No increase in right ventricular wall thickness. Right ventricular systolic function is normal. There is normal pulmonary artery systolic pressure. The tricuspid regurgitant velocity is 2.38  m/s, and with an assumed right atrial pressure of 3 mmHg, the estimated right ventricular systolic pressure is 25.7 mmHg. Left Atrium: Left atrial size was normal in size. Right Atrium: Right atrial size was normal in size. Pericardium:  There is no evidence of pericardial effusion. Mitral Valve: The mitral valve is normal in structure. No evidence of mitral valve regurgitation. No evidence of mitral valve stenosis. Tricuspid Valve: The tricuspid valve is normal in structure. Tricuspid valve regurgitation is mild . No evidence of tricuspid stenosis. Aortic Valve: The aortic valve is normal in structure. Aortic valve regurgitation is mild. No aortic stenosis is present. Aortic valve mean gradient measures 2.0 mmHg. Aortic valve peak gradient measures 3.2 mmHg. Aortic valve area, by VTI measures 3.48 cm. Pulmonic Valve: The pulmonic valve was normal in structure. Pulmonic valve regurgitation is trivial. No evidence of pulmonic stenosis. Aorta: The aortic root is normal in size and structure. Venous: The inferior vena cava is normal in size with greater than 50% respiratory variability, suggesting right atrial pressure of 3 mmHg. IAS/Shunts: No atrial level shunt detected by color flow Doppler.  LEFT VENTRICLE PLAX 2D LVIDd:         4.70 cm   Diastology LVIDs:         3.60 cm   LV e' medial:    7.30 cm/s LV PW:         0.90 cm   LV E/e' medial:  6.6 LV IVS:        1.20 cm   LV e' lateral:   7.77 cm/s LVOT diam:     2.10 cm   LV E/e' lateral: 6.2 LV SV:         61 LV SV Index:   31 LVOT Area:     3.46 cm  RIGHT VENTRICLE            IVC RV Basal diam:  3.70 cm    IVC diam: 1.40 cm RV Mid diam:    3.00 cm RV S prime:     9.79 cm/s  PULMONARY VEINS TAPSE (M-mode): 1.5 cm     Diastolic Velocity: 33.00 cm/s                            S/D Velocity:       1.40                            Systolic Velocity:  44.60 cm/s LEFT ATRIUM           Index        RIGHT ATRIUM           Index LA diam:      2.70 cm 1.36 cm/m   RA Area:     18.90 cm LA Vol (A2C): 61.4 ml 30.91 ml/m  RA Volume:   51.20 ml  25.77 ml/m  LA Vol (A4C): 50.1 ml 25.22 ml/m  AORTIC VALVE AV Area (Vmax):    3.49 cm AV Area (Vmean):   3.30 cm AV Area (VTI):     3.48 cm AV Vmax:            89.55 cm/s AV Vmean:          61.800 cm/s AV VTI:            0.175 m AV Peak Grad:      3.2 mmHg AV Mean Grad:      2.0 mmHg LVOT Vmax:         90.27 cm/s LVOT Vmean:        58.950 cm/s LVOT VTI:          0.176 m LVOT/AV VTI ratio: 1.01  AORTA Ao Root diam: 3.30 cm Ao Asc diam:  3.40 cm MITRAL VALVE               TRICUSPID VALVE MV Area (PHT): 3.72 cm    TR Peak grad:   22.7 mmHg MV Decel Time: 204 msec    TR Vmax:        238.00 cm/s MV E velocity: 48.00 cm/s MV Nyzaiah Kai velocity: 74.10 cm/s  SHUNTS MV E/Kolbe Delmonaco ratio:  0.65        Systemic VTI:  0.18 m                            Systemic Diam: 2.10 cm Toribio Fuel MD Electronically signed by Toribio Fuel MD Signature Date/Time: 10/26/2024/11:33:58 AM    Final    CT Angio Chest PE W and/or Wo Contrast Result Date: 10/25/2024 EXAM: CTA of the Chest with contrast for PE 10/25/2024 TECHNIQUE: CTA of the chest was performed after the administration of intravenous contrast. Multiplanar reformatted images are provided for review. MIP images are provided for review. Automated exposure control, iterative reconstruction, and/or weight based adjustment of the mA/kV was utilized to reduce the radiation dose to as low as reasonably achievable. COMPARISON: 10/17/2024, 10/04/2024 CLINICAL HISTORY: FINDINGS: PULMONARY ARTERIES: Occlusive embolus within the distal right pulmonary artery with occlusive and nonocclusive emboli extending into the lobar and sigmoid branches of the right upper and right lower lobes. Distal segmental embolus also noted in the right middle lobe. Main pulmonary artery is normal in caliber. MEDIASTINUM: Right ventricular dilation with the RV to LV ratio greater than 1. Leftward shift of the cardiomediastinal structures. No cardiomegaly. There is no acute abnormality of the thoracic aorta. LYMPH NODES: No mediastinal, hilar or axillary lymphadenopathy. LUNGS AND PLEURA: Hazy ground glass opacities within the posterior right upper lobe, the right middle, and  right lower lobes. Dense airspace consolidation within the periphery of the left upper lobe, lingula, and left lower lobe. Small left pleural effusion. Trace right pleural effusion. No pneumothorax. UPPER ABDOMEN: Limited images of the upper abdomen demonstrate cholecystectomy. No reflux of contrast into the hepatic veins. SOFT TISSUES AND BONES: Symmetric bilateral gynecomastia. Scattered cervical and thoracic osteophytosis. No acute bone or soft tissue abnormality. Critical value/emergent results were called by telephone at the time of interpretation on 10/25/2024 at 6:28pm to Dr. Sharie, who verbally acknowledged these results. IMPRESSION: 1. Moderate volume occlusive and nonocclusive pulmonary embolus throughout the right lung with findings worrisome for right heart strain. 2. Hazy ground glass opacities in the posterior right upper lobe, right middle, and right lower lobes, with dense airspace consolidation in the periphery of the left  upper lobe, lingula, and left lower lobe, worrisome for multifocal pneumonia. Given the differences in technique, this is similar to the prior chest radiograph. Small left pleural effusion and trace right pleural effusion. 3. Critical value/emergent results were called by telephone at the time of interpretation on 10/25/24 at 6:28pm to Dr. Sharie, who verbally acknowledged these results. Electronically signed by: Rogelia Myers MD 10/25/2024 06:38 PM EST RP Workstation: [id number]   DG Chest Port 1 View Result Date: 10/25/2024 CLINICAL DATA:  Shortness of breath. EXAM: PORTABLE CHEST 1 VIEW COMPARISON:  10/17/2024. FINDINGS: The heart size and mediastinal contours are stable. Increased airspace disease and consolidation is noted in the left lung. There is Narcissa Melder trace right pleural effusion. No pneumothorax is seen. No acute osseous abnormality. IMPRESSION: 1. Interval worsening of left lung consolidation. 2. Small right pleural effusion. Electronically Signed   By: Leita Birmingham M.D.   On: 10/25/2024 15:55        Scheduled Meds:  Chlorhexidine  Gluconate Cloth  6 each Topical Daily   doxycycline   100 mg Oral Q12H   lidocaine   1 patch Transdermal Daily   losartan   100 mg Oral Daily   pantoprazole   40 mg Oral QAC breakfast   polyethylene glycol  17 g Oral Daily   senna-docusate  2 tablet Oral BID   traZODone   50 mg Oral QHS   Continuous Infusions:  cefTRIAXone  (ROCEPHIN )  IV Stopped (10/26/24 1637)   heparin  1,650 Units/hr (10/27/24 0616)     LOS: 2 days    Time spent: over 30 min    Meliton Monte, MD [facility]   To contact the attending provider between 7A-7P or the covering provider during after hours 7P-7A, please log into the web site [email or web address] and access using universal [place] password for that web site. If you do not have the password, please call the hospital operator.  10/27/2024, 10:31 AM    "

## 2024-10-27 NOTE — Evaluation (Signed)
 Clinical/Bedside Swallow Evaluation Patient Details  Name: Bryce Martinez MRN: [id number] Date of Birth: [birth year]  Today's Date: 10/27/2024 Time: SLP Start Time (ACUTE ONLY): 1144 SLP Stop Time (ACUTE ONLY): 1150 SLP Time Calculation (min) (ACUTE ONLY): 6 min  Past Medical History:  Past Medical History:  Diagnosis Date   Allergy    Arthritis    ED (erectile dysfunction)    GERD (gastroesophageal reflux disease)    Occasionally   Hepatitis C    Hyperlipidemia    Hypertension    Past Surgical History:  Past Surgical History:  Procedure Laterality Date   APPENDECTOMY     CHOLECYSTECTOMY N/A 04/25/2014   Procedure: LAPAROSCOPIC CHOLECYSTECTOMY;  Surgeon: Oneil DELENA Budge, MD;  Location: [facility];  Service: General;  Laterality: N/A;   COLONOSCOPY N/A 09/01/2019   Procedure: COLONOSCOPY;  Surgeon: Golda Claudis PENNER, MD;  Location: [facility];  Service: Endoscopy;  Laterality: N/A;  930   COLONOSCOPY N/A 09/27/2024   Procedure: COLONOSCOPY;  Surgeon: Eartha Angelia Sieving, MD;  Location: [facility];  Service: Gastroenterology;  Laterality: N/A;  7:30am, ASA 1-2   HEMICOLECTOMY Right    lap hand assisted   POLYPECTOMY  09/01/2019   Procedure: POLYPECTOMY;  Surgeon: Golda Claudis PENNER, MD;  Location: [facility];  Service: Endoscopy;;  colon   right shoulder surgery Right 03/2005   rotator cuff repair   HPI:  Bryce Martinez is a 73 yo M who presented 12/22 with dyspna. Required BiPAP, now transitioned to Bryce Martinez.  Chest CT 12/22 with : 1. Moderate volume occlusive and nonocclusive pulmonary embolus throughout the  right lung with findings worrisome for right heart strain.  2. Hazy ground glass opacities in the posterior right upper lobe, right middle, and right lower lobes, with dense airspace consolidation in the periphery of the left upper lobe, lingula, and left lower lobe, worrisome for multifocal pneumonia. Given the differences in technique, this is similar to the prior  chest radiograph. Small left pleural effusion and trace right pleural effusion.  Pt with recent admission for aspiration pna 2/2 emesis during colonscopy which required intubation. FEES completed on 12/4 without aspiration. Pt with hx HTN, dyslipidemia, HFrEF, hepatitis C, and other medical issues who presented to [facility] 12/22 as a transfer from [facility] for dyspnea secondary to PE.    Assessment / Plan / Recommendation  Clinical Impression  Pt presents with normal swallow function as assessed clinically.  Pt tolerated all consistencies trialed without any clinical s/s of aspiration and exhibited excellent oral clearance of solids.  Pt is known to this service from prior admission for aspiration pna 2/2 emesis during colonoscopy.  Pt completed FEES 12/4 with no aspiration observed.  He reports no changes to swallow function since then and had no prior dysphagia hx at at that time. Pt can continue current diet and has no further ST needs at this time.  SLP will sign off.   Recommend regular texture diet with thin liquids.   SLP Visit Diagnosis: Dysphagia, oropharyngeal phase (R13.12)    Aspiration Risk  No limitations    Diet Recommendation Regular;Thin liquid    Liquid Administration via: Cup;Straw Medication Administration: Whole meds with liquid Supervision: Patient able to self feed Compensations: Slow rate;Small sips/bites Postural Changes: Seated upright at 90 degrees    Other Recommendations Oral Care Recommendations: Oral care BID     Swallow Evaluation Recommendations  N/A   Assistance Recommended at Discharge  N/A  Functional Status Assessment Patient has not  had a recent decline in their functional status  Frequency and Duration  (N/A)          Prognosis Prognosis for improved oropharyngeal function:  (N/A)      Swallow Study   General Date of Onset: 10/25/24 HPI: Bryce Martinez is a 73 yo M who presented 12/22 with dyspna. Required BiPAP, now transitioned to [name].  Chest  CT 12/22 with : 1. Moderate volume occlusive and nonocclusive pulmonary embolus throughout the  right lung with findings worrisome for right heart strain.  2. Hazy ground glass opacities in the posterior right upper lobe, right middle, and right lower lobes, with dense airspace consolidation in the periphery of the left upper lobe, lingula, and left lower lobe, worrisome for multifocal pneumonia. Given the differences in technique, this is similar to the prior chest radiograph. Small left pleural effusion and trace right pleural effusion.  Pt with recent admission for aspiration pna 2/2 emesis during colonscopy which required intubation. FEES completed on 12/4 without aspiration. Pt with hx HTN, dyslipidemia, HFrEF, hepatitis C, and other medical issues who presented to [facility] 12/22 as a transfer from [facility] for dyspnea secondary to PE. Type of Study: Bedside Swallow Evaluation Previous Swallow Assessment: FEES 12/4 Diet Prior to this Study: Regular;Thin liquids (Level 0) Temperature Spikes Noted: No Respiratory Status: Nasal cannula History of Recent Intubation: No Behavior/Cognition: Alert;Cooperative;Pleasant mood Oral Cavity Assessment: Within Functional Limits Oral Care Completed by SLP: No Oral Cavity - Dentition: Adequate natural dentition Patient Positioning: Upright in bed Baseline Vocal Quality: Hoarse;Low vocal intensity Volitional Cough: Weak Volitional Swallow: Able to elicit    Oral/Motor/Sensory Function Overall Oral Motor/Sensory Function: Within functional limits   Ice Chips Ice chips: Not tested   Thin Liquid Thin Liquid: Within functional limits Presentation: Straw    Nectar Thick Nectar Thick Liquid: Not tested   Honey Thick Honey Thick Liquid: Not tested   Puree Puree: Within functional limits Presentation: Spoon   Solid     Solid: Within functional limits Presentation:  (SLP fed)      Anette FORBES Grippe, MA, CCC-SLP Acute Rehabilitation Services Office:  [phone] 10/27/2024,12:08 PM

## 2024-10-27 NOTE — Progress Notes (Signed)
 PHARMACY - ANTICOAGULATION CONSULT NOTE  Pharmacy Consult for heparin  Indication: PE/DVT  Labs: Recent Labs    10/25/24 1527 10/25/24 1529 10/26/24 0259 10/26/24 1219 10/27/24 0136  HGB 10.6* 12.9* 10.3*  --  9.2*  HCT 32.6* 38.0* 31.1*  --  27.7*  PLT 258  --  270  --  278  APTT 36  --   --   --   --   LABPROT 15.8*  --   --   --   --   INR 1.2  --   --   --   --   HEPARINUNFRC  --   --  0.20* 0.23* 0.44  CREATININE 0.90 1.10  --   --   --    Assessment/Plan:  73yo male therapeutic on heparin  after rate change. Will continue infusion at current rate of 1650 units/hr and confirm stable with additional level.  Marvetta Dauphin, PharmD, BCPS 10/27/2024 2:44 AM

## 2024-10-27 NOTE — Evaluation (Signed)
 Physical Therapy Evaluation Patient Details Name: Bryce Martinez MRN: [id number] DOB: 10-04-1951 Today's Date: 10/27/2024  History of Present Illness  73 year old man who presented to [facility] 12/22 as a transfer from [facility] for dyspnea secondary to PE. 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 Recent admission to [facility] 11/24-12/9 for ARDS secondary to aspiration pneumonia, requiring mechanical ventilation 11/24-12/1.  During that admission, found to have systolic CHF with Echo revealing LVEF 25 to 30% and global hypokinesis. Pt d/c'd to CIR on 12/9 and remained admitted there through 12/15.   Clinical Impression  Pt admitted with above. Pt with recent hospital and CIR stay. PTA pt using rollator for long distance ambulation and attending outpt PT program. Pt was receiving some assist from wife for ADLs as patient was SOB and would fatigue quickly. Pt currently requiring contact guard assist and is most limited by SOB. Pt's SpO2 dec to 85% on 2lo2 via [place] during transfer from EOB to chair. SpO2 at 88% on 2Lo2 via [place] doing seated LE exercises. Pt reports mild SOB but overall feeling well. Anticipate pt to progress well and be able to d/c home with spouse and return to outpt PT services once cleared medically. Acute PT to cont to follow.        If plan is discharge home, recommend the following: A little help with walking and/or transfers;A little help with bathing/dressing/bathroom;Assistance with cooking/housework;Assist for transportation   Can travel by private vehicle        Equipment Recommendations None recommended by PT  Recommendations for Other Services       Functional Status Assessment Patient has had a recent decline in their functional status and demonstrates the ability to make significant improvements in function in a reasonable and predictable amount of time.     Precautions / Restrictions Precautions Precautions: Fall Recall of  Precautions/Restrictions: Intact Precaution/Restrictions Comments: watch SpO2 Restrictions Weight Bearing Restrictions Per Provider Order: No      Mobility  Bed Mobility Overal bed mobility: Needs Assistance Bed Mobility: Rolling, Sidelying to Sit Rolling: Supervision Sidelying to sit: Min assist       General bed mobility comments: increased time, used bed rail, minA for trunk elevation, able to bring LEs off EOB    Transfers Overall transfer level: Needs assistance Equipment used: Rolling walker (2 wheels) Transfers: Sit to/from Stand, Bed to chair/wheelchair/BSC Sit to Stand: Contact guard assist, From elevated surface   Step pivot transfers: Contact guard assist       General transfer comment: verbal cues for hand placement/to push up from bed, increased time, noted RR up to 40, SpO2 dec to 85% on 2Lo2 via , pt able to step with RW to chair, pt reports some SOB. pt took 3 minutes to recover SpO2 > 90% on 2Lo2 via     Ambulation/Gait               General Gait Details: deferred due to drop in Spo2 to 85% with std pvt to chair  Stairs            Wheelchair Mobility     Tilt Bed    Modified Rankin (Stroke Patients Only)       Balance Overall balance assessment: Mild deficits observed, not formally tested  Pertinent Vitals/Pain Pain Assessment Pain Assessment: No/denies pain (none reported)    Home Living Family/patient expects to be discharged to:: Private residence Living Arrangements: Spouse/significant other Available Help at Discharge: Family;Available 24 hours/day Type of Home: House Home Access: Stairs to enter Entrance Stairs-Rails: Can reach both Entrance Stairs-Number of Steps: 3   Home Layout: Laundry or work area in basement;One level Home Equipment: Rollator (4 wheels);Tub bench      Prior Function Prior Level of Function : Needs assist              Mobility Comments: pt was using rollator for long distance amb since d/c from CIR due to SOB ADLs Comments: wife helping with dressing and bathing due to SOB     Extremity/Trunk Assessment   Upper Extremity Assessment Upper Extremity Assessment: Left hand dominant    Lower Extremity Assessment Lower Extremity Assessment: Generalized weakness    Cervical / Trunk Assessment Cervical / Trunk Assessment: Normal  Communication   Communication Communication: No apparent difficulties    Cognition Arousal: Alert Behavior During Therapy: WFL for tasks assessed/performed   PT - Cognitive impairments: No apparent impairments                         Following commands: Intact       Cueing Cueing Techniques: Verbal cues     General Comments General comments (skin integrity, edema, etc.): BP stable, RR up to 40 during movement, SpO2 dec to 85% on 2Lo2 via [place]    Exercises General Exercises - Lower Extremity Ankle Circles/Pumps: AROM, Both, 10 reps, Seated Long Arc Quad: AROM, Both, 10 reps, Seated Hip Flexion/Marching: AROM, Both, 5 reps, Standing, Seated   Assessment/Plan    PT Assessment Patient needs continued PT services  PT Problem List Decreased strength;Decreased activity tolerance;Decreased knowledge of use of DME;Decreased balance;Decreased mobility       PT Treatment Interventions DME instruction;Therapeutic exercise;Gait training;Balance training;Stair training;Functional mobility training;Therapeutic activities;Patient/family education    PT Goals (Current goals can be found in the Care Plan section)  Acute Rehab PT Goals Patient Stated Goal: home PT Goal Formulation: With patient/family Time For Goal Achievement: 11/10/24 Potential to Achieve Goals: Good    Frequency Min 3X/week     Co-evaluation               AM-PAC PT 6 Clicks Mobility  Outcome Measure Help needed turning from your back to your side while in a flat bed without  using bedrails?: A Little Help needed moving from lying on your back to sitting on the side of a flat bed without using bedrails?: A Little Help needed moving to and from a bed to a chair (including a wheelchair)?: A Little Help needed standing up from a chair using your arms (e.g., wheelchair or bedside chair)?: A Little Help needed to walk in hospital room?: A Little Help needed climbing 3-5 steps with a railing? : A Lot 6 Click Score: 17    End of Session Equipment Utilized During Treatment: Gait belt Activity Tolerance: Patient limited by fatigue Patient left: in chair;with call bell/phone within reach Nurse Communication: Mobility status;Other (comment) (drop in SpO2) PT Visit Diagnosis: Other abnormalities of gait and mobility (R26.89);Muscle weakness (generalized) (M62.81);Difficulty in walking, not elsewhere classified (R26.2);Unsteadiness on feet (R26.81)    Time: 8847-8780 PT Time Calculation (min) (ACUTE ONLY): 27 min   Charges:   PT Evaluation $PT Eval Low Complexity: 1 Low PT Treatments $Therapeutic Exercise: 8-22 mins  PT General Charges $$ ACUTE PT VISIT: 1 Visit         Norene Ames, PT, DPT Acute Rehabilitation Services Secure chat preferred Office #: [phone]   Norene CHRISTELLA Ames 10/27/2024, 12:58 PM

## 2024-10-28 DIAGNOSIS — J9601 Acute respiratory failure with hypoxia: Secondary | ICD-10-CM | POA: Diagnosis not present

## 2024-10-28 LAB — COMPREHENSIVE METABOLIC PANEL WITH GFR
ALT: 38 U/L (ref 0–44)
AST: 25 U/L (ref 15–41)
Albumin: 2.5 g/dL — ABNORMAL LOW (ref 3.5–5.0)
Alkaline Phosphatase: 66 U/L (ref 38–126)
Anion gap: 8 (ref 5–15)
BUN: 7 mg/dL — ABNORMAL LOW (ref 8–23)
CO2: 23 mmol/L (ref 22–32)
Calcium: 8.7 mg/dL — ABNORMAL LOW (ref 8.9–10.3)
Chloride: 102 mmol/L (ref 98–111)
Creatinine, Ser: 0.78 mg/dL (ref 0.61–1.24)
GFR, Estimated: >60 mL/min
Glucose, Bld: 84 mg/dL (ref 70–99)
Potassium: 3.8 mmol/L (ref 3.5–5.1)
Sodium: 134 mmol/L — ABNORMAL LOW (ref 135–145)
Total Bilirubin: 0.3 mg/dL (ref 0.0–1.2)
Total Protein: 6.5 g/dL (ref 6.5–8.1)

## 2024-10-28 LAB — CBC WITH DIFFERENTIAL/PLATELET
Abs Immature Granulocytes: 0.48 K/uL — ABNORMAL HIGH (ref 0.00–0.07)
Basophils Absolute: 0 K/uL (ref 0.0–0.1)
Basophils Relative: 0 %
Eosinophils Absolute: 0.1 K/uL (ref 0.0–0.5)
Eosinophils Relative: 1 %
HCT: 29.3 % — ABNORMAL LOW (ref 39.0–52.0)
Hemoglobin: 9.7 g/dL — ABNORMAL LOW (ref 13.0–17.0)
Immature Granulocytes: 4 %
Lymphocytes Relative: 13 %
Lymphs Abs: 1.5 K/uL (ref 0.7–4.0)
MCH: 28.1 pg (ref 26.0–34.0)
MCHC: 33.1 g/dL (ref 30.0–36.0)
MCV: 84.9 fL (ref 80.0–100.0)
Monocytes Absolute: 0.9 K/uL (ref 0.1–1.0)
Monocytes Relative: 8 %
Neutro Abs: 8.2 K/uL — ABNORMAL HIGH (ref 1.7–7.7)
Neutrophils Relative %: 74 %
Platelets: 312 K/uL (ref 150–400)
RBC: 3.45 MIL/uL — ABNORMAL LOW (ref 4.22–5.81)
RDW: 14.4 % (ref 11.5–15.5)
WBC: 11.2 K/uL — ABNORMAL HIGH (ref 4.0–10.5)
nRBC: 0 % (ref 0.0–0.2)

## 2024-10-28 LAB — HEPARIN LEVEL (UNFRACTIONATED): Heparin Unfractionated: 0.42 [IU]/mL (ref 0.30–0.70)

## 2024-10-28 LAB — PHOSPHORUS: Phosphorus: 2.8 mg/dL (ref 2.5–4.6)

## 2024-10-28 LAB — MAGNESIUM: Magnesium: 1.6 mg/dL — ABNORMAL LOW (ref 1.7–2.4)

## 2024-10-28 MED ORDER — APIXABAN 5 MG PO TABS
10.0000 mg | ORAL_TABLET | Freq: Two times a day (BID) | ORAL | Status: DC
  Administered 2024-10-28 – 2024-10-29 (×3): 10 mg via ORAL
  Filled 2024-10-28 (×3): qty 2

## 2024-10-28 MED ORDER — APIXABAN 5 MG PO TABS: 5.0000 mg | ORAL_TABLET | Freq: Two times a day (BID) | ORAL | Status: DC

## 2024-10-28 NOTE — Progress Notes (Signed)
 [facility]  Bryce Martinez FMW:[id number]  DOB: 1951/01/02  DOA: 10/25/2024  PCP: [facility]  10/28/2024,5:39 AM  LOS: 3 days    Code Status:  full code     From:  home    73 year old male Recent d/c-- ARDS s/p colonoscopy-(transferred [facility]--- intubated until 12/1-completing antibiotics-new systolic CHF EF 25-30%--also coincidental COVID-positive) Known advanced fibrosis with hep C Discharged to CIR 12/12--- 12/18-discharged on Protonix  Keflex  azithromycin  and salt tablets 12/19 saw PCP-felt LLL PNA-started Z-Pak 12/22 seen at [facility],DOE sat 64% diameter >20 BNP 2291--CT chest moderate volume occlusive nonocclusive PE right lung  Transferred to the ICU 12/24 [facility]    Assessment  & Plan :    Multifactorial respiratory failure acute hypoxic type I SLP has cleared him to eat Overall he looks quite comfortable at the bedside is still on oxygen-note desat screen from yesterday showed that he was in the 88% with activity so he will require DME oxygen Up out of bed later today  Pulmonary-PESI score 92 class III intermediate risk DVT LCFv VLFv, right popliteal vein Heparin  GTT discontinued this morning in favor of Eliquis  per pharmacy Watch for bleeding overnight/intolerances  HFrEF-->HFmrEF normal RV EF 50% grade 1 DD this admission Stress-induced cardiomyopathy hold on SGLT2 Continues losartan  100 daily  Recent L LL pneumonia This hospitalization resumed on antibiotics 12/22  Circumscribe antibiotics to doxycycline  100 BID-stop ceftriaxone  as blood culture X2 from admission NGTD  Overall debility Frailty in the setting of critical illness myopathy Will need further work in the hospital to rehabilitate and strengthen him-I have asked mobility to work with him to get him up out of bed  Data Reviewed today: Sodium 134 potassium 3.8 BUN/creatinine 7/0.7 phosphorus 2.8 magnesium  1.6 WBC 11.2 hemoglobin 9.7 platelet 312   DVT  prophylaxis: Eliquis  currently   Dispo/Global plan: Likely discharge in a.m. if all well-needs mobility-has been hospitalized on and off for the past 3 weeks first in ICU then to rest then CIR so would be prudent to make sure that he is at functional capacity that wife can take care of him     Subjective:   Awake alert coherent no distress looks well feels well eating drinking On oxygen Feels overall weak-typically is quite active-used to work out go to the gym before all this happened No current chest pain fevers nausea vomiting No cough  Objective + exam Vitals:   10/27/24 1639 10/27/24 2037 10/28/24 0149 10/28/24 0425  BP: 124/79 131/75 139/74 138/80  Pulse: 70 64 69 77  Resp: 18 19 19 19   Temp: 98.3 F (36.8 C) 98.2 F (36.8 C) 98.5 F (36.9 C) 97.7 F (36.5 C)  TempSrc: Oral Oral Oral Oral  SpO2: 100% 97% 96% 97%  Weight:      Height:       Filed Weights   10/25/24 1429 10/26/24 0600  Weight: 73 kg 75.2 kg     Examination: EOMI NCAT no focal deficit no icterus no pallor no wheeze no rales no rhonchi-S1-S2 no murmur rub gallop RRR-abdomen soft no rebound no guarding No lower extremity edema--power 5/5--slightly emaciated supraclavicular wasting  Scheduled Meds:  Chlorhexidine  Gluconate Cloth  6 each Topical Daily   doxycycline   100 mg Oral Q12H   lidocaine   1 patch Transdermal Daily   losartan   100 mg Oral Daily   pantoprazole   40 mg Oral QAC breakfast   polyethylene glycol  17 g Oral Daily   senna-docusate  2 tablet  Oral BID   traZODone   50 mg Oral QHS   Continuous Infusions:  cefTRIAXone  (ROCEPHIN )  IV 1 g (10/27/24 1705)   heparin  1,650 Units/hr (10/27/24 1710)   acetaminophen , guaiFENesin , oxyCODONE   Time 36 minutes  Jai-Gurmukh Rashaud Ybarbo, MD  [facility]

## 2024-10-28 NOTE — Evaluation (Signed)
 Occupational Therapy Evaluation Patient Details Name: Bryce Martinez MRN: [id number] DOB: 02-Feb-1951 Today's Date: 10/28/2024   History of Present Illness   73 year old man who presented to [facility] 12/22 as a transfer from [facility] for dyspnea secondary to PE. 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 Recent admission to [facility] 11/24-12/9 for ARDS secondary to aspiration pneumonia, requiring mechanical ventilation 11/24-12/1.  During that admission, found to have systolic CHF with Echo revealing LVEF 25 to 30% and global hypokinesis. Pt d/c'd to CIR on 12/9 and remained admitted there through 12/15.     Clinical Impressions Pt recently discharged home with CIR. Since returning home, pt requiring Mod I to Min assist with ADLs, performing functional mobility Ind to Mod I with intermittent use of Rollator, and receiving assistance form wife for IADLs. Pt now presents with decreased activity tolerance, impaired cardiopulmonary status, and mild balance deficits but with no overt LOB. Pt currently demonstrating ability to complete ADLs Mod I to Contact guard assist and functional mobility/transfers with a RW with Contact guard assist. Pt's wife also demonstrates ability to Independently assist pt safety during all ADLs and functional mobility/transfers with RW. Pt requiring frequent sitting or standing rest breaks during session due to decreased activity tolerance and intermittent feeling of SOB with HR 74 to 108 bpm and O2 sat >/93% on 2L continuous O2 through nasal cannula. Pt participated well in session, is motivated to return to PLOF, and has good family support. Pt will benefit from acute skilled OT services to address deficits and increase safety and independence with functional tasks. No post acute skilled OT needs are anticipated at this time.      If plan is discharge home, recommend the following:   A little help with walking and/or transfers;A little  help with bathing/dressing/bathroom;Assistance with cooking/housework;Assist for transportation;Help with stairs or ramp for entrance;Direct supervision/assist for medications management;Direct supervision/assist for financial management     Functional Status Assessment   Patient has had a recent decline in their functional status and demonstrates the ability to make significant improvements in function in a reasonable and predictable amount of time.     Equipment Recommendations   None recommended by OT (Pt already has needed equipment)     Recommendations for Other Services         Precautions/Restrictions   Precautions Precautions: Fall Recall of Precautions/Restrictions: Intact Precaution/Restrictions Comments: watch SpO2 Restrictions Weight Bearing Restrictions Per Provider Order: No     Mobility Bed Mobility               General bed mobility comments: Pt sitting in recliner at beginning and end of session    Transfers Overall transfer level: Needs assistance Equipment used: Rolling walker (2 wheels) Transfers: Sit to/from Stand, Bed to chair/wheelchair/BSC Sit to Stand: Contact guard assist (from recliner)     Step pivot transfers: Contact guard assist     General transfer comment: verbal cues for hand placement; with mildly increased time      Balance Overall balance assessment: Mild deficits observed, not formally tested                                         ADL either performed or assessed with clinical judgement   ADL Overall ADL's : Needs assistance/impaired Eating/Feeding: Modified independent;Sitting   Grooming: Contact guard assist;Standing;With caregiver independent assisting  Upper Body Bathing: Supervision/ safety;Set up;Sitting;With caregiver independent assisting   Lower Body Bathing: Contact guard assist;Sit to/from stand;With caregiver independent assisting   Upper Body Dressing : Contact guard  assist;Sitting;With caregiver independent assisting Upper Body Dressing Details (indicate cue type and reason): assist to manage telemetry cords Lower Body Dressing: Contact guard assist;Sit to/from stand;With caregiver independent assisting   Toilet Transfer: Contact guard assist;Ambulation;Rolling walker (2 wheels);Comfort height toilet;With caregiver independent assisting   Toileting- Clothing Manipulation and Hygiene: Contact guard assist;Sit to/from stand;With caregiver independent assisting       Functional mobility during ADLs: Contact guard assist;Rolling walker (2 wheels);Caregiver able to provide necessary level of assistance General ADL Comments: Pt requiring mildly increased time and intemittent sitting or standing rest breaks. Pt with decreased activity tolerance, fatiguing quickly during tasks. Pt also with mild feeling of SOB during ADLs in standing at sink with O2 sat >/93% on 2L continuous O2 through nasal cannula throughout session     Vision Baseline Vision/History: 1 Wears glasses (wears reading glasses) Ability to See in Adequate Light: 0 Adequate Patient Visual Report: No change from baseline Vision Assessment?: Wears glasses for reading Additional Comments: [facility] for tasks assessed; not formally screened or evaluated     Perception         Praxis         Pertinent Vitals/Pain Pain Assessment Pain Assessment: No/denies pain     Extremity/Trunk Assessment Upper Extremity Assessment Upper Extremity Assessment: Left hand dominant;Overall [facility] for tasks assessed (mildly decreased B shoulder flexion at baseline; B strength, ROM, coordination, and sensation otherwise [facility])   Lower Extremity Assessment Lower Extremity Assessment: Defer to PT evaluation   Cervical / Trunk Assessment Cervical / Trunk Assessment: Normal   Communication Communication Communication: No apparent difficulties   Cognition Arousal: Alert Behavior During Therapy: WFL for tasks  assessed/performed Cognition: Cognition impaired     Awareness: Intellectual awareness intact, Online awareness intact Memory impairment (select all impairments): Short-term memory Attention impairment (select first level of impairment): Alternating attention Executive functioning impairment (select all impairments): Problem solving (requiring increased time) OT - Cognition Comments: Pt AAOx4 and pleasant throughout session with cognition largely [facility] for tasks assessed, but with mild noted short-term memory deficits and pt requiring mildly increased time for problem solving                 Following commands: Intact       Cueing  General Comments   Cueing Techniques: Verbal cues  HR 74 to 108 bpm and O2 sat >/93% on 2L continuous O2 through nasal cannula. Pt's wife present and supportive throughout session.   Exercises     Shoulder Instructions      Home Living Family/patient expects to be discharged to:: Private residence Living Arrangements: Spouse/significant other Available Help at Discharge: Family;Available 24 hours/day Type of Home: House Home Access: Stairs to enter [employer] of Steps: 3 Entrance Stairs-Rails: Can reach both Home Layout: Laundry or work area in basement;One level     Bathroom Shower/Tub: Chief Strategy Officer: Standard     Home Equipment: Rollator (4 wheels);Tub bench          Prior Functioning/Environment Prior Level of Function : Needs assist             Mobility Comments: pt was using rollator for long distance amb since d/c from CIR due to SOB ADLs Comments: wife provided CGA to Min assist for LB dressing/bathing since returning home from CIR due  to SOB; pt otherwise completing ADLs with Mod I to Set up; wife assisting with IADLs    OT Problem List: Decreased activity tolerance   OT Treatment/Interventions: Self-care/ADL training;Energy conservation;DME and/or AE instruction;Therapeutic  activities;Patient/family education      OT Goals(Current goals can be found in the care plan section)   Acute Rehab OT Goals Patient Stated Goal: to feel better and return home OT Goal Formulation: With patient/family Time For Goal Achievement: 11/11/24 Potential to Achieve Goals: Good ADL Goals Pt Will Perform Grooming: with supervision;standing Pt Will Perform Lower Body Bathing: with supervision;sit to/from stand Pt Will Perform Lower Body Dressing: with supervision;sit to/from stand Pt Will Transfer to Toilet: with supervision;ambulating (comfort height toilet; with least restrictive AD) Additional ADL Goal #1: Patient will demonstrate ability to participate in 5 or more minutes of a functional or therapeutic activity without the need for a rest break to increase safety and independence with functional tasks.   OT Frequency:  Min 2X/week    Co-evaluation              AM-PAC OT 6 Clicks Daily Activity     Outcome Measure Help from another person eating meals?: None Help from another person taking care of personal grooming?: A Little Help from another person toileting, which includes using toliet, bedpan, or urinal?: A Little Help from another person bathing (including washing, rinsing, drying)?: A Little Help from another person to put on and taking off regular upper body clothing?: A Little Help from another person to put on and taking off regular lower body clothing?: A Little 6 Click Score: 19   End of Session Equipment Utilized During Treatment: Gait belt;Rolling walker (2 wheels);Oxygen Nurse Communication: Mobility status;Other (comment) (Vital signs. Pt participated well. Pt with question regarding cough medication.)  Activity Tolerance: Patient tolerated treatment well Patient left: in chair;with call bell/phone within reach;with family/visitor present  OT Visit Diagnosis: Unsteadiness on feet (R26.81);Other (comment) (decreased activity tolerance)                 Time: 8663-8595 OT Time Calculation (min): 28 min Charges:  OT General Charges $OT Visit: 1 Visit OT Evaluation $OT Eval Low Complexity: 1 Low OT Treatments $Self Care/Home Management : 8-22 mins  Margarie Rockey HERO., OTR/L, MA Acute Rehab [phone]   Margarie FORBES Horns 10/28/2024, 2:41 PM

## 2024-10-28 NOTE — Consult Note (Signed)
 Pharmacy Consult Note  Pharmacy Consult:  Eliquis  Indication: pulmonary embolus  Allergies[1]  PATIENT MEASUREMENTS: Height: 6' 1 (185.4 cm) Weight: 75.2 kg (165 lb 12.6 oz) IBW/kg (Calculated) : 79.9 HEPARIN  DW (KG): 73  VITAL SIGNS: Temp: 97.7 F (36.5 C) (12/25 0425) Temp Source: Oral (12/25 0425) BP: 138/80 (12/25 0425) Pulse Rate: 77 (12/25 0425)  Recent Labs    10/25/24 1527 10/25/24 1529 10/27/24 0136 10/27/24 0759 10/28/24 0507  HGB 10.6*   < > 9.2*  --  9.7*  HCT 32.6*   < > 27.7*  --  29.3*  PLT 258   < > 278  --  312  APTT 36  --   --   --   --   LABPROT 15.8*  --   --   --   --   INR 1.2  --   --   --   --   HEPARINUNFRC  --    < > 0.44 0.51  --   CREATININE 0.90   < > 0.82  --   --    < > = values in this interval not displayed.    Estimated Creatinine Clearance: 86.6 mL/min (by C-G formula based on SCr of 0.82 mg/dL).   ASSESSMENT: 73 y.o. male with PE for Eliquis    PLAN: Stop heparin  Eliquis  10 mg BID X 7 days, then Eliquis  5 mg BID  Cathlyn Arrant, PharmD, BCPS  10/28/2024, 6:05 AM       [1]  Allergies Allergen Reactions   Amlodipine  Other (See Comments)    Swelling of feet

## 2024-10-29 ENCOUNTER — Other Ambulatory Visit (HOSPITAL_COMMUNITY): Payer: Self-pay

## 2024-10-29 DIAGNOSIS — J9601 Acute respiratory failure with hypoxia: Secondary | ICD-10-CM | POA: Diagnosis not present

## 2024-10-29 MED ORDER — APIXABAN 5 MG PO TABS
ORAL_TABLET | ORAL | 0 refills | Status: AC
  Filled 2024-10-29: qty 72, 30d supply, fill #0

## 2024-10-29 MED ORDER — DOXYCYCLINE HYCLATE 100 MG PO TABS
100.0000 mg | ORAL_TABLET | Freq: Two times a day (BID) | ORAL | 0 refills | Status: AC
  Filled 2024-10-29: qty 7, 4d supply, fill #0

## 2024-10-29 NOTE — TOC Transition Note (Signed)
 Transition of Care [facility]) - Discharge Note   Patient Details  Name: Bryce Martinez MRN: [id number] Date of Birth: 09-13-1951  Transition of Care [facility]) CM/SW Contact:  Tom-Johnson, Harvest Muskrat, RN Phone Number: 10/29/2024, 12:08 PM   Clinical Narrative:     Patient is scheduled for discharge today.  Readmission Risk Assessment done. Home health info, Home O2 info, outpatient f/u, hospital f/u and discharge instructions on AVS. Prescriptions sent to [facility] pharmacy and patient will receive meds prior discharge. Wife, [name] at bedside and will transport at discharge.  No further ICM needs noted.      Final next level of care: Home w Home Health Services Barriers to Discharge: Barriers Resolved   Patient Goals and CMS Choice Patient states their goals for this hospitalization and ongoing recovery are:: To return home CMS Medicare.gov Compare Post Acute Care list provided to:: Patient Choice offered to / list presented to : Patient, Spouse      Discharge Placement                Patient to be transferred to facility by: Wife Name of family member notified: [facility]    Discharge Plan and Services Additional resources added to the After Visit Summary for                  DME Arranged: Oxygen DME Agency: [facility] Date DME Agency Contacted: 10/29/24 Time DME Agency Contacted: 1148 Representative spoke with at DME Agency: [facility] HH Arranged: PT, RN [facility] Agency: [facility] Date [facility] Agency Contacted: 10/29/24 Time HH Agency Contacted: [phone] Representative spoke with at [facility] Agency: [place]  Social Drivers of Health (SDOH) Interventions SDOH Screenings   Food Insecurity: No Food Insecurity (10/27/2024)  Housing: Low Risk (10/27/2024)  Transportation Needs: No Transportation Needs (10/27/2024)  Utilities: Not At Risk (10/27/2024)  Alcohol Screen: Low Risk (06/28/2024)  Depression (PHQ2-9): Low Risk (10/22/2024)  Financial Resource Strain:  Low Risk (06/28/2024)  Physical Activity: Insufficiently Active (06/28/2024)  Social Connections: Moderately Integrated (10/27/2024)  Stress: No Stress Concern Present (06/28/2024)  Tobacco Use: Medium Risk (10/25/2024)  Health Literacy: Adequate Health Literacy (06/29/2024)     Readmission Risk Interventions    10/29/2024   12:06 PM  Readmission Risk Prevention Plan  Transportation Screening Complete  PCP or Specialist Appt within 3-5 Days Complete  HRI or Home Care Consult Complete  Social Work Consult for Recovery Care Planning/Counseling Complete  Palliative Care Screening Not Applicable  Medication Review Oceanographer) Referral to Pharmacy

## 2024-10-29 NOTE — Progress Notes (Signed)
 SATURATION QUALIFICATIONS: (This note is used to comply with regulatory documentation for home oxygen)  Patient Saturations on Room Air at Rest = 85%  Patient Saturations on Room Air while Ambulating = NT as pt drops on RA at rest%  Patient Saturations on 3 Liters of oxygen while Ambulating = 90%  Please briefly explain why patient needs home oxygen:Pt required 2LO2 at rest and 3LO2 with activity to keep saturation > 90%.  Wife to purchase a pulse oximeter.  Krista M,PT Acute Rehab Services [phone]

## 2024-10-29 NOTE — Progress Notes (Signed)
 Physical Therapy Treatment Patient Details Name: Bryce Martinez MRN: [id number] DOB: [birth year] Today's Date: 10/29/2024   History of Present Illness 73 year old man who presented to [facility] 12/22 as a transfer from [facility] for dyspnea secondary to PE. 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 Recent admission to [facility] 11/24-12/9 for ARDS secondary to aspiration pneumonia, requiring mechanical ventilation 11/24-12/1.  During that admission, found to have systolic CHF with Echo revealing LVEF 25 to 30% and global hypokinesis. Pt d/c'd to CIR on 12/9 and remained admitted there through 12/15.    PT Comments  Pt admitted with above diagnosis. Pt was able to ambulate in hallway with CGA needing 2LO2 at rest and 3LO2 with activity to keep sats > 90%.  Pt aware of need for O2 and educated regarding incentive spirometer. Pts wife will assist prn at home.  Would benefit from HHPT.  Pt currently with functional limitations due to the deficits listed below (see PT Problem List). Pt will benefit from acute skilled PT to increase their independence and safety with mobility to allow discharge.       If plan is discharge home, recommend the following: A little help with walking and/or transfers;A little help with bathing/dressing/bathroom;Assistance with cooking/housework;Assist for transportation   Can travel by private vehicle        Equipment Recommendations  Other (comment) (pulse oximeter)    Recommendations for Other Services       Precautions / Restrictions Precautions Precautions: Fall Recall of Precautions/Restrictions: Intact Precaution/Restrictions Comments: watch SpO2 Restrictions Weight Bearing Restrictions Per Provider Order: No     Mobility  Bed Mobility Overal bed mobility: Needs Assistance Bed Mobility: Rolling, Sidelying to Sit Rolling: Supervision Sidelying to sit: Supervision            Transfers Overall transfer level: Needs  assistance Equipment used: Rolling walker (2 wheels) Transfers: Sit to/from Stand, Bed to chair/wheelchair/BSC Sit to Stand: Contact guard assist   Step pivot transfers: Contact guard assist       General transfer comment: verbal cues for hand placement; with mildly increased time    Ambulation/Gait Ambulation/Gait assistance: Contact guard assist Gait Distance (Feet): 300 Feet Assistive device: None Gait Pattern/deviations: Trunk flexed, Narrow base of support, Step-through pattern, Decreased stride length, Drifts right/left Gait velocity: slowed Gait velocity interpretation: <1.8 ft/sec, indicate of risk for recurrent falls   General Gait Details: No significant LOB with ambulation although at times does drift but self corrects.  Pt did desaturate and require 3LO2 with activity and 2L at rest. Encouraged pt to use rollator and wife can assist at home. wife is ordering a pulse oximeter.   Stairs Stairs: Yes Stairs assistance: Contact guard assist Stair Management: Forwards, One rail Right, Step to pattern Number of Stairs: 2     Wheelchair Mobility     Tilt Bed    Modified Rankin (Stroke Patients Only)       Balance                                            Communication Communication Communication: No apparent difficulties Factors Affecting Communication: Reduced clarity of speech (soft voice)  Cognition Arousal: Alert Behavior During Therapy: WFL for tasks assessed/performed   PT - Cognitive impairments: No apparent impairments  PT - Cognition Comments: slow processing Following commands: Intact      Cueing    Exercises General Exercises - Lower Extremity Long Arc Quad: AROM, Both, 10 reps, Seated Hip Flexion/Marching: AROM, Both, 5 reps, Standing, Seated Other Exercises Other Exercises: educated in use of incentive spirometer    General Comments General comments (skin integrity, edema, etc.):  SATURATION QUALIFICATIONS: (This note is used to comply with regulatory documentation for home oxygen)     Patient Saturations on Room Air at Rest = 85%     Patient Saturations on Room Air while Ambulating = NT as pt drops on RA at rest%     Patient Saturations on 3 Liters of oxygen while Ambulating = 90%     Please briefly explain why patient needs home oxygen:Pt required 2LO2 at rest and 3LO2 with activity to keep saturation > 90%.      Pertinent Vitals/Pain Pain Assessment Pain Assessment: No/denies pain    Home Living                          Prior Function            PT Goals (current goals can now be found in the care plan section) Progress towards PT goals: Progressing toward goals    Frequency    Min 2X/week      PT Plan      Co-evaluation              AM-PAC PT 6 Clicks Mobility   Outcome Measure  Help needed turning from your back to your side while in a flat bed without using bedrails?: A Little Help needed moving from lying on your back to sitting on the side of a flat bed without using bedrails?: A Little Help needed moving to and from a bed to a chair (including a wheelchair)?: A Little Help needed standing up from a chair using your arms (e.g., wheelchair or bedside chair)?: A Little Help needed to walk in hospital room?: A Little Help needed climbing 3-5 steps with a railing? : A Little 6 Click Score: 18    End of Session Equipment Utilized During Treatment: Gait belt;Oxygen Activity Tolerance: Patient limited by fatigue Patient left: in chair;with call bell/phone within reach;with family/visitor present Nurse Communication: Mobility status;Other (comment) (drop in SpO2) PT Visit Diagnosis: Other abnormalities of gait and mobility (R26.89);Muscle weakness (generalized) (M62.81);Difficulty in walking, not elsewhere classified (R26.2);Unsteadiness on feet (R26.81)     Time: 8981-8957 PT Time Calculation (min) (ACUTE ONLY): 24  min  Charges:    $Gait Training: 23-37 mins PT General Charges $$ ACUTE PT VISIT: 1 Visit                     Jaymie Mckiddy M,PT Acute Rehab Services [phone]    Stephane JULIANNA Bevel 10/29/2024, 10:57 AM

## 2024-10-29 NOTE — Discharge Summary (Signed)
 Physician Discharge Summary  Bryce Martinez FMW:[id number] DOB: January 31, 1951 DOA: 10/25/2024  PCP: [facility]  Admit date: 10/25/2024 Discharge date: 10/29/2024  Time spent: 37 minutes  Recommendations for Outpatient Follow-up:  Needs uninterrupted anticoagulation X 3 months then decision about prolonging per PCP at the [facility] Recommend outpatient cirrhosis testing at the [facility] with FibroSure if not done already Complete antibiotics doxycycline  going forward for completion of treatment of possible pneumonia SGLT2 discontinued will need outpatient follow-up for the same  Discharge Diagnoses:  MAIN problem for hospitalization   Acute pulmonary embolism PEsi score 92 Superimposed underlying pneumonia  Please see below for itemized issues addressed in HOpsital- refer to other progress notes for clarity if needed  Discharge Condition: Improved  Diet recommendation: Heart healthy  Filed Weights   10/25/24 1429 10/26/24 0600  Weight: 73 kg 75.2 kg    History of present illness:  73 year old male Recent d/c-- ARDS s/p colonoscopy-(transferred from [facility]--- intubated until 12/1-completing antibiotics-new systolic CHF EF 25-30%--also coincidental COVID-positive) Known advanced fibrosis with hep C Discharged to CIR 12/12--- 12/18-discharged on Protonix  Keflex  azithromycin  and salt tablets 12/[facility]felt LLL PNA-started Z-Pak 12/22 seen at [facility],DOE sat 64% diameter >20 BNP 2291--CT chest moderate volume occlusive nonocclusive PE right lung             Transferred to the ICU 12/24 transferred to [facility]      Assessment  & Plan :      Multifactorial respiratory failure acute hypoxic type I SLP has cleared him to eat and he is tolerating the same pretty well Chest pain is improved he will need oxygen going forward at discharge and will need rehabilitation from his recent multiple hospital stays   Pulmonary-PESI score 92 class III  intermediate risk DVT LCFv VLFv, right popliteal vein Heparin  GTT transition 12/25-->loading dose Eliquis  , and will continue this for at least 90 days uninterrupted Defer to PCP to continue the same   HFrEF-->HFmrEF normal RV EF 50% grade 1 DD this admission Stress-induced cardiomyopathyhold on SGLT2 Continues losartan  100 daily   Recent L LL pneumonia This hospitalization resumed on antibiotics 12/22  Initially was on ceftriaxone  and broad spectrum coverage IV--discontinuing and is now on doxycycline  100 BID-narrowed to complete as blood culture X2 from admission NGTD   Overall debility Frailty in the setting of critical illness myopathy Will need further work in the hospital to rehabilitate and strengthen him-outpatient ambulatory referral placed to rehab    Discharge Exam: Vitals:   10/29/24 0429 10/29/24 0836  BP: 137/84 (!) 152/94  Pulse: 70 70  Resp: 18 18  Temp: 98.3 F (36.8 C) 98.3 F (36.8 C)  SpO2: 99% 96%    Subj on day of d/c   Looks well feels fair walked to the [place] no chest pain and did not feel too bad  General Exam on discharge  EOMI NCAT CTAB no wheeze rales rhonchi Chest is clear Seems comfortable--S1-S2 no murmur Abdomen soft no rebound no guarding ROM intact no focal deficit Power 5/5 no JVD  Discharge Instructions   Discharge Instructions     Ambulatory referral to Physical Therapy   Complete by: As directed    Discharge instructions   Complete by: As directed    Make sure that you use your oxygen at all times-I have called in new medications to our downstairs pharmacy as this would be cost effective and very easy to obtain and quite competitive-you need a blood  thinner for at least 3 to 6 months Complete the doxycycline  which you have been prescribed as you had a pneumonia Please follow-up with your primary on your prior appointment January 8 is reasonable although you probably need labs in about 7 to 10 days if you can get  that scheduled Notify if severe bleeding if vomiting if other issues of blood We will attempt to set you up for ambulatory referral for outpatient therapy   Increase activity slowly   Complete by: As directed       Allergies as of 10/29/2024       Reactions   Amlodipine  Other (See Comments)   Swelling of feet        Medication List     STOP taking these medications    acetaminophen  325 MG tablet Commonly known as: TYLENOL    azithromycin  250 MG tablet Commonly known as: ZITHROMAX    cephALEXin  500 MG capsule Commonly known as: KEFLEX    sodium chloride  1 g tablet       TAKE these medications    apixaban  5 MG Tabs tablet Commonly known as: ELIQUIS  Take 2 tablets (10 mg total) by mouth 2 (two) times daily for 6 days, THEN 1 tablet (5 mg total) 2 (two) times daily. Start taking on: October 29, 2024   doxycycline  100 MG tablet Commonly known as: VIBRA -TABS Take 1 tablet (100 mg total) by mouth every 12 (twelve) hours for 7 doses.   guaiFENesin  600 MG 12 hr tablet Commonly known as: MUCINEX  Take 600 mg by mouth in the morning and at bedtime.   lidocaine  5 % Commonly known as: LIDODERM  Place 1 patch onto the skin daily. Remove & Discard patch within 12 hours or as directed by MD   losartan  100 MG tablet Commonly known as: COZAAR  TAKE 1 TABLET BY MOUTH EVERY DAY   pantoprazole  40 MG tablet Commonly known as: PROTONIX  Take 1 tablet (40 mg total) by mouth daily before breakfast.   polyethylene glycol 17 g packet Commonly known as: MIRALAX  / GLYCOLAX  Take 17 g by mouth daily.   senna-docusate 8.6-50 MG tablet Commonly known as: Senokot-S Take 2 tablets by mouth 2 (two) times daily.   sodium chloride  0.65 % Soln nasal spray Commonly known as: OCEAN Place 1 spray into both nostrils as needed for congestion.   traZODone  50 MG tablet Commonly known as: DESYREL  Take 1 tablet (50 mg total) by mouth at bedtime.               Durable Medical  Equipment  (From admission, onward)           Start     Ordered   10/29/24 1054  DME Oxygen  Once       Question Answer Comment  Length of Need Lifetime   Mode or (Route) Nasal cannula   Liters per Minute 3   Frequency Continuous (stationary and portable oxygen unit needed)   Oxygen conserving device Yes   Oxygen delivery system: Gas   Oxygen delivery system: Portable concentrator (POC)   Oxygen delivery system: Gas/Tank      10/29/24 1057   10/28/24 0721  For home use only DME oxygen  Once       Question Answer Comment  Length of Need 12 Months   Mode or (Route) Nasal cannula   Liters per Minute 3   Oxygen delivery system: Gas      10/28/24 0720           Allergies[1]  The results of significant diagnostics from this hospitalization (including imaging, microbiology, ancillary and laboratory) are listed below for reference.    Significant Diagnostic Studies: VAS US  LOWER EXTREMITY VENOUS (DVT) Result Date: 10/26/2024  Lower Venous DVT Study Patient Name:  Bryce Martinez  Date of Exam:   10/26/2024 Medical Rec #: [id number]           Accession #:    [id number] Date of Birth: October 29, 1951           Patient Gender: M Patient Age:   68 years Exam Location:  [facility] Procedure:      VAS US  LOWER EXTREMITY VENOUS (DVT) Referring Phys: STEPHANIE REESE --------------------------------------------------------------------------------  Indications: Pulmonary embolism.  Risk Factors: Confirmed PE. Anticoagulation: Heparin . Comparison Study: No prior studies. Performing Technologist: Cordella Collet RVT  Examination Guidelines: A complete evaluation includes B-mode imaging, spectral Doppler, color Doppler, and power Doppler as needed of all accessible portions of each vessel. Bilateral testing is considered an integral part of a complete examination. Limited examinations for reoccurring indications may be performed as noted. The reflux portion of the exam is performed with  the patient in reverse Trendelenburg.  +---------+---------------+---------+-----------+----------+--------------+ RIGHT    CompressibilityPhasicitySpontaneityPropertiesThrombus Aging +---------+---------------+---------+-----------+----------+--------------+ CFV      Full           Yes      Yes                                 +---------+---------------+---------+-----------+----------+--------------+ SFJ      Full                                                        +---------+---------------+---------+-----------+----------+--------------+ FV Prox  Full                                                        +---------+---------------+---------+-----------+----------+--------------+ FV Mid   Full                                                        +---------+---------------+---------+-----------+----------+--------------+ FV DistalFull                                                        +---------+---------------+---------+-----------+----------+--------------+ PFV      Full                                                        +---------+---------------+---------+-----------+----------+--------------+ POP      Partial        Yes      Yes  Acute          +---------+---------------+---------+-----------+----------+--------------+ PTV      Full                                                        +---------+---------------+---------+-----------+----------+--------------+ PERO     Full                                                        +---------+---------------+---------+-----------+----------+--------------+   +---------+---------------+---------+-----------+----------+--------------+ LEFT     CompressibilityPhasicitySpontaneityPropertiesThrombus Aging +---------+---------------+---------+-----------+----------+--------------+ CFV      Partial        Yes      Yes                  Acute           +---------+---------------+---------+-----------+----------+--------------+ SFJ      Partial                                      Acute          +---------+---------------+---------+-----------+----------+--------------+ FV Prox  None           No       No                   Acute          +---------+---------------+---------+-----------+----------+--------------+ FV Mid   None           No       No                   Acute          +---------+---------------+---------+-----------+----------+--------------+ FV DistalFull                                                        +---------+---------------+---------+-----------+----------+--------------+ PFV      Full                                                        +---------+---------------+---------+-----------+----------+--------------+ POP      Full           Yes      Yes                                 +---------+---------------+---------+-----------+----------+--------------+ PTV      Full                                                        +---------+---------------+---------+-----------+----------+--------------+ PERO  Full                                                        +---------+---------------+---------+-----------+----------+--------------+ EIV                     Yes      Yes                                 +---------+---------------+---------+-----------+----------+--------------+     Summary: RIGHT: - Findings consistent with acute deep vein thrombosis involving the right popliteal vein.  - No cystic structure found in the popliteal fossa.  LEFT: - Findings consistent with acute deep vein thrombosis involving the left common femoral vein, and left femoral vein.  - No cystic structure found in the popliteal fossa.  *See table(s) above for measurements and observations. Electronically signed by Lonni Gaskins MD on 10/26/2024 at 5:00:33 PM.    Final    ECHOCARDIOGRAM  COMPLETE Result Date: 10/26/2024    ECHOCARDIOGRAM REPORT   Patient Name:   Bryce Martinez Date of Exam: 10/26/2024 Medical Rec #:  [id number]          Height:       73.0 in Accession #:    [id number]         Weight:       165.8 lb Date of Birth:  06-Apr-1951          BSA:          1.987 m Patient Age:    73 years           BP:           131/78 mmHg Patient Gender: M                  HR:           77 bpm. Exam Location:  Inpatient Procedure: 2D Echo, Cardiac Doppler and Color Doppler (Both Spectral and Color            Flow Doppler were utilized during procedure). Indications:    Pulmonary Embolus  History:        Patient has prior history of Echocardiogram examinations, most                 recent 09/28/2024. Risk Factors:Dyslipidemia and Hypertension.  Sonographer:    Sherlean Dubin Referring Phys: [id number] COREAN HERO REESE  Sonographer Comments: Image acquisition challenging due to respiratory motion. IMPRESSIONS  1. Left ventricular ejection fraction, by estimation, is 45 to 50%. The left ventricle has mildly decreased function. The left ventricle demonstrates global hypokinesis. Left ventricular diastolic parameters are consistent with Grade I diastolic dysfunction (impaired relaxation).  2. Right ventricular systolic function is normal. The right ventricular size is mildly enlarged. There is normal pulmonary artery systolic pressure. The estimated right ventricular systolic pressure is 25.7 mmHg.  3. The mitral valve is normal in structure. No evidence of mitral valve regurgitation. No evidence of mitral stenosis.  4. The aortic valve is normal in structure. Aortic valve regurgitation is mild. No aortic stenosis is present.  5. The inferior vena cava is normal in size with greater than 50% respiratory variability, suggesting right atrial pressure of 3 mmHg. FINDINGS  Left Ventricle: Left  ventricular ejection fraction, by estimation, is 45 to 50%. The left ventricle has mildly decreased function. The left  ventricle demonstrates global hypokinesis. The left ventricular internal cavity size was normal in size. There is  no left ventricular hypertrophy. Left ventricular diastolic parameters are consistent with Grade I diastolic dysfunction (impaired relaxation). Right Ventricle: The right ventricular size is mildly enlarged. No increase in right ventricular wall thickness. Right ventricular systolic function is normal. There is normal pulmonary artery systolic pressure. The tricuspid regurgitant velocity is 2.38  m/s, and with an assumed right atrial pressure of 3 mmHg, the estimated right ventricular systolic pressure is 25.7 mmHg. Left Atrium: Left atrial size was normal in size. Right Atrium: Right atrial size was normal in size. Pericardium: There is no evidence of pericardial effusion. Mitral Valve: The mitral valve is normal in structure. No evidence of mitral valve regurgitation. No evidence of mitral valve stenosis. Tricuspid Valve: The tricuspid valve is normal in structure. Tricuspid valve regurgitation is mild . No evidence of tricuspid stenosis. Aortic Valve: The aortic valve is normal in structure. Aortic valve regurgitation is mild. No aortic stenosis is present. Aortic valve mean gradient measures 2.0 mmHg. Aortic valve peak gradient measures 3.2 mmHg. Aortic valve area, by VTI measures 3.48 cm. Pulmonic Valve: The pulmonic valve was normal in structure. Pulmonic valve regurgitation is trivial. No evidence of pulmonic stenosis. Aorta: The aortic root is normal in size and structure. Venous: The inferior vena cava is normal in size with greater than 50% respiratory variability, suggesting right atrial pressure of 3 mmHg. IAS/Shunts: No atrial level shunt detected by color flow Doppler.  LEFT VENTRICLE PLAX 2D LVIDd:         4.70 cm   Diastology LVIDs:         3.60 cm   LV e' medial:    7.30 cm/s LV PW:         0.90 cm   LV E/e' medial:  6.6 LV IVS:        1.20 cm   LV e' lateral:   7.77 cm/s LVOT diam:      2.10 cm   LV E/e' lateral: 6.2 LV SV:         61 LV SV Index:   31 LVOT Area:     3.46 cm  RIGHT VENTRICLE            IVC RV Basal diam:  3.70 cm    IVC diam: 1.40 cm RV Mid diam:    3.00 cm RV S prime:     9.79 cm/s  PULMONARY VEINS TAPSE (M-mode): 1.5 cm     Diastolic Velocity: 33.00 cm/s                            S/D Velocity:       1.40                            Systolic Velocity:  44.60 cm/s LEFT ATRIUM           Index        RIGHT ATRIUM           Index LA diam:      2.70 cm 1.36 cm/m   RA Area:     18.90 cm LA Vol (A2C): 61.4 ml 30.91 ml/m  RA Volume:   51.20 ml  25.77 ml/m LA Vol (A4C): 50.1  ml 25.22 ml/m  AORTIC VALVE AV Area (Vmax):    3.49 cm AV Area (Vmean):   3.30 cm AV Area (VTI):     3.48 cm AV Vmax:           89.55 cm/s AV Vmean:          61.800 cm/s AV VTI:            0.175 m AV Peak Grad:      3.2 mmHg AV Mean Grad:      2.0 mmHg LVOT Vmax:         90.27 cm/s LVOT Vmean:        58.950 cm/s LVOT VTI:          0.176 m LVOT/AV VTI ratio: 1.01  AORTA Ao Root diam: 3.30 cm Ao Asc diam:  3.40 cm MITRAL VALVE               TRICUSPID VALVE MV Area (PHT): 3.72 cm    TR Peak grad:   22.7 mmHg MV Decel Time: 204 msec    TR Vmax:        238.00 cm/s MV E velocity: 48.00 cm/s MV A velocity: 74.10 cm/s  SHUNTS MV E/A ratio:  0.65        Systemic VTI:  0.18 m                            Systemic Diam: 2.10 cm Toribio Fuel MD Electronically signed by Toribio Fuel MD Signature Date/Time: 10/26/2024/11:33:58 AM    Final    CT Angio Chest PE W and/or Wo Contrast Result Date: 10/25/2024 EXAM: CTA of the Chest with contrast for PE 10/25/2024 TECHNIQUE: CTA of the chest was performed after the administration of intravenous contrast. Multiplanar reformatted images are provided for review. MIP images are provided for review. Automated exposure control, iterative reconstruction, and/or weight based adjustment of the mA/kV was utilized to reduce the radiation dose to as low as reasonably achievable.  COMPARISON: 10/17/2024, 10/04/2024 CLINICAL HISTORY: FINDINGS: PULMONARY ARTERIES: Occlusive embolus within the distal right pulmonary artery with occlusive and nonocclusive emboli extending into the lobar and sigmoid branches of the right upper and right lower lobes. Distal segmental embolus also noted in the right middle lobe. Main pulmonary artery is normal in caliber. MEDIASTINUM: Right ventricular dilation with the RV to LV ratio greater than 1. Leftward shift of the cardiomediastinal structures. No cardiomegaly. There is no acute abnormality of the thoracic aorta. LYMPH NODES: No mediastinal, hilar or axillary lymphadenopathy. LUNGS AND PLEURA: Hazy ground glass opacities within the posterior right upper lobe, the right middle, and right lower lobes. Dense airspace consolidation within the periphery of the left upper lobe, lingula, and left lower lobe. Small left pleural effusion. Trace right pleural effusion. No pneumothorax. UPPER ABDOMEN: Limited images of the upper abdomen demonstrate cholecystectomy. No reflux of contrast into the hepatic veins. SOFT TISSUES AND BONES: Symmetric bilateral gynecomastia. Scattered cervical and thoracic osteophytosis. No acute bone or soft tissue abnormality. Critical value/emergent results were called by telephone at the time of interpretation on 10/25/2024 at 6:28pm to Dr. Sharie, who verbally acknowledged these results. IMPRESSION: 1. Moderate volume occlusive and nonocclusive pulmonary embolus throughout the right lung with findings worrisome for right heart strain. 2. Hazy ground glass opacities in the posterior right upper lobe, right middle, and right lower lobes, with dense airspace consolidation in the periphery of the left upper lobe, lingula, and left  lower lobe, worrisome for multifocal pneumonia. Given the differences in technique, this is similar to the prior chest radiograph. Small left pleural effusion and trace right pleural effusion. 3. Critical  value/emergent results were called by telephone at the time of interpretation on 10/25/24 at 6:28pm to Dr. Sharie, who verbally acknowledged these results. Electronically signed by: Rogelia Myers MD 10/25/2024 06:38 PM EST RP Workstation: [id number]   DG Chest Port 1 View Result Date: 10/25/2024 CLINICAL DATA:  Shortness of breath. EXAM: PORTABLE CHEST 1 VIEW COMPARISON:  10/17/2024. FINDINGS: The heart size and mediastinal contours are stable. Increased airspace disease and consolidation is noted in the left lung. There is a trace right pleural effusion. No pneumothorax is seen. No acute osseous abnormality. IMPRESSION: 1. Interval worsening of left lung consolidation. 2. Small right pleural effusion. Electronically Signed   By: Leita Birmingham M.D.   On: 10/25/2024 15:55   DG Chest 2 View Result Date: 10/17/2024 CLINICAL DATA:  Cough. EXAM: CHEST - 2 VIEW COMPARISON:  10/04/2024 FINDINGS: New diffuse left lung airspace disease is seen mainly involving the lower lobe, suspicious for pneumonia. No pleural effusion. Heart size remains normal. IMPRESSION: New left lung airspace disease mainly involving the left lower lobe, suspicious for pneumonia. Electronically Signed   By: Norleen DELENA Kil M.D.   On: 10/17/2024 15:45   DG Abd 1 View Result Date: 10/12/2024 EXAM: 1 VIEW XRAY OF THE ABDOMEN 10/12/2024 06:29:00 PM COMPARISON: 10/02/2024 CLINICAL HISTORY: Abdominal bloating. FINDINGS: BOWEL: Continued diffuse gaseous distention of the bowel suggests it is ileus. This is similar to the prior study. SOFT TISSUES: Prior cholecystectomy. No abnormal calcifications. BONES: No acute fracture. IMPRESSION: 1. Continued diffuse gaseous distention of the bowel, similar to the prior study, suggests ileus. Electronically signed by: Franky Crease MD 10/12/2024 07:34 PM EST RP Workstation: [id number]   DG CHEST PORT 1 VIEW Result Date: 10/04/2024 CLINICAL DATA:  141880 SOB (shortness of breath) [id number] EXAM: PORTABLE CHEST -  1 VIEW COMPARISON:  October 02, 2024 FINDINGS: Low lung volumes. Patchy opacities in both lung bases. No pleural effusion or pneumothorax. No cardiomegaly. Aortic atherosclerosis. No acute fracture or destructive lesions. Multilevel thoracic osteophytosis. Right IJ approach central venous catheter terminates at the cavoatrial junction. Endotracheal tube is well-positioned in the mid trachea. Weighted feeding tube courses below the diaphragm with the distal tip not included in the field of view. IMPRESSION: 1. Similar patchy airspace opacities in both lung bases. No pleural effusion or pneumothorax. 2. Similar positioning of the support tubes and lines, as described above. Electronically Signed   By: Rogelia Myers M.D.   On: 10/04/2024 10:04   DG Chest 1 View Result Date: 10/02/2024 EXAM: 1 VIEW(S) XRAY OF THE CHEST 10/02/2024 08:03:00 AM COMPARISON: 09/27/2024 CLINICAL HISTORY: ARDS (adult respiratory distress syndrome) (HCC) FINDINGS: LINES, TUBES AND DEVICES: Stable endotracheal tube. Enteric feeding tube in place, courses to abdomen with tip not included. Stable right IJ central line. LUNGS AND PLEURA: Increased confluence of left lung base opacity with obscuration of left hemidiaphragm. Improved left lung aeration with decreased but persistent widespread left lung airspace opacity. Possible left pleural effusion. No pneumothorax. HEART AND MEDIASTINUM: No acute abnormality of the cardiac and mediastinal silhouettes. BONES AND SOFT TISSUES: Chronic right shoulder calcific tendinosis redemonstrated. No acute osseous abnormality. IMPRESSION: 1. Increased confluence of left lung base opacity with obscuration of left hemidiaphragm. 2. Improved left lung aeration with decreased but persistent widespread left lung airspace opacity. 3. Possible left pleural effusion. Electronically signed by: Evalene  Hugh MD 10/02/2024 08:21 AM EST RP Workstation: [id number]   DG Abd 1 View Result Date: 10/02/2024 EXAM: 1  VIEW XRAY OF THE ABDOMEN 10/02/2024 08:03:00 AM COMPARISON: None available. CLINICAL HISTORY: Abdominal distension. FINDINGS: LINES, TUBES AND DEVICES: There is a feeding tube present with its tip in the distal stomach. BOWEL: There is mild diffuse gaseous distention of the bowel. SOFT TISSUES: Surgical clips are present in the gallbladder fossa. No opaque urinary calculi. BONES: No acute osseous abnormality. LUNGS: There is coarsened streaky opacification/consolidation of the left lung base. IMPRESSION: 1. Mild diffuse gaseous distention of the bowel. 2. Coarsened streaky opacification/consolidation of the left lung base. Electronically signed by: Evalene Hugh MD 10/02/2024 08:21 AM EST RP Workstation: [id number]    Microbiology: Recent Results (from the past 240 hours)  Resp panel by RT-PCR (RSV, Flu A&B, Covid) Anterior Nasal Swab     Status: None   Collection Time: 10/25/24  2:55 PM   Specimen: Anterior Nasal Swab  Result Value Ref Range Status   SARS Coronavirus 2 by RT PCR NEGATIVE NEGATIVE Final    Comment: (NOTE) SARS-CoV-2 target nucleic acids are NOT DETECTED.  The SARS-CoV-2 RNA is generally detectable in upper respiratory specimens during the acute phase of infection. The lowest concentration of SARS-CoV-2 viral copies this assay can detect is 138 copies/mL. A negative result does not preclude SARS-Cov-2 infection and should not be used as the sole basis for treatment or other patient management decisions. A negative result may occur with  improper specimen collection/handling, submission of specimen other than nasopharyngeal swab, presence of viral mutation(s) within the areas targeted by this assay, and inadequate number of viral copies(<138 copies/mL). A negative result must be combined with clinical observations, patient history, and epidemiological information. The expected result is Negative.  Fact Sheet for Patients:  bloggercourse.com  Fact  Sheet for Healthcare Providers:  seriousbroker.it  This test is no t yet approved or cleared by the [place]  FDA and  has been authorized for detection and/or diagnosis of SARS-CoV-2 by FDA under an Emergency Use Authorization (EUA). This EUA will remain  in effect (meaning this test can be used) for the duration of the COVID-19 declaration under Section 564(b)(1) of the Act, 21 U.S.C.section 360bbb-3(b)(1), unless the authorization is terminated  or revoked sooner.       Influenza A by PCR NEGATIVE NEGATIVE Final   Influenza B by PCR NEGATIVE NEGATIVE Final    Comment: (NOTE) The Xpert Xpress SARS-CoV-2/FLU/RSV plus assay is intended as an aid in the diagnosis of influenza from Nasopharyngeal swab specimens and should not be used as a sole basis for treatment. Nasal washings and aspirates are unacceptable for Xpert Xpress SARS-CoV-2/FLU/RSV testing.  Fact Sheet for Patients: bloggercourse.com  Fact Sheet for Healthcare Providers: seriousbroker.it  This test is not yet approved or cleared by the [place]  FDA and has been authorized for detection and/or diagnosis of SARS-CoV-2 by FDA under an Emergency Use Authorization (EUA). This EUA will remain in effect (meaning this test can be used) for the duration of the COVID-19 declaration under Section 564(b)(1) of the Act, 21 U.S.C. section 360bbb-3(b)(1), unless the authorization is terminated or revoked.     Resp Syncytial Virus by PCR NEGATIVE NEGATIVE Final    Comment: (NOTE) Fact Sheet for Patients: bloggercourse.com  Fact Sheet for Healthcare Providers: seriousbroker.it  This test is not yet approved or cleared by the [place]  FDA and has been authorized for detection and/or diagnosis of SARS-CoV-2 by FDA under  an Emergency Use Authorization (EUA). This EUA will remain in effect  (meaning this test can be used) for the duration of the COVID-19 declaration under Section 564(b)(1) of the Act, 21 U.S.C. section 360bbb-3(b)(1), unless the authorization is terminated or revoked.  Performed at [facility], [address], [place], [place] [address]   Culture, blood (routine x 2)     Status: None (Preliminary result)   Collection Time: 10/25/24  3:27 PM   Specimen: BLOOD  Result Value Ref Range Status   Specimen Description BLOOD BLOOD LEFT ARM LAC  Final   Special Requests   Final    Blood Culture results may not be optimal due to an inadequate volume of blood received in culture bottles   Culture   Final    NO GROWTH 4 DAYS Performed at [facility], [address], [place], [place] [address]    Report Status PENDING  Incomplete  Culture, blood (routine x 2)     Status: None (Preliminary result)   Collection Time: 10/25/24  3:27 PM   Specimen: BLOOD  Result Value Ref Range Status   Specimen Description BLOOD BLOOD RIGHT ARM RAC  Final   Special Requests Blood Culture adequate volume  Final   Culture   Final    NO GROWTH 4 DAYS Performed at [facility] [address] [place], [place] [address]    Report Status PENDING  Incomplete  MRSA Next Gen by PCR, Nasal     Status: None   Collection Time: 10/26/24  7:18 AM   Specimen: Nasal Mucosa; Nasal Swab  Result Value Ref Range Status   MRSA by PCR Next Gen NOT DETECTED NOT DETECTED Final    Comment: (NOTE) The GeneXpert MRSA Assay (FDA approved for NASAL specimens only), is one component of a comprehensive MRSA colonization surveillance program. It is not intended to diagnose MRSA infection nor to guide or monitor treatment for MRSA infections. Test performance is not FDA approved in patients less than 3 years old. Performed at [facility], [address], [place], [place] [address]      Labs: Basic Metabolic Panel: Recent Labs  Lab 10/25/24 1527 10/25/24 1529 10/27/24 0136 10/28/24 0507  NA  134* 136 134* 134*  K 4.0 4.0 3.9 3.8  CL 100 99 102 102  CO2 23  --  24 23  GLUCOSE 113* 117* 91 84  BUN 8 7* 10 7*  CREATININE 0.90 1.10 0.82 0.78  CALCIUM  9.1  --  8.5* 8.7*  MG  --   --   --  1.6*  PHOS  --   --   --  2.8   Liver Function Tests: Recent Labs  Lab 10/25/24 1527 10/28/24 0507  AST 25 25  ALT 55* 38  ALKPHOS 81 66  BILITOT 0.4 0.3  PROT 7.3 6.5  ALBUMIN 2.9* 2.5*   No results for input(s): LIPASE, AMYLASE in the last 168 hours. No results for input(s): AMMONIA in the last 168 hours. CBC: Recent Labs  Lab 10/25/24 1527 10/25/24 1529 10/26/24 0259 10/27/24 0136 10/28/24 0507  WBC 11.8*  --  12.4* 11.3* 11.2*  NEUTROABS 8.8*  --   --   --  8.2*  HGB 10.6* 12.9* 10.3* 9.2* 9.7*  HCT 32.6* 38.0* 31.1* 27.7* 29.3*  MCV 85.8  --  84.5 84.5 84.9  PLT 258  --  270 278 312   Cardiac Enzymes: No results for input(s): CKTOTAL, CKMB, CKMBINDEX, TROPONINI in the last 168  hours. BNP: BNP (last 3 results) Recent Labs    09/28/24 0238  BNP 251.0*    ProBNP (last 3 results) Recent Labs    09/27/24 1157 10/25/24 1527  PROBNP 62.7 2,291.0*    CBG: Recent Labs  Lab 10/25/24 2101 10/26/24 1922 10/27/24 0741  GLUCAP 81 137* 87    Signed:  Colen Grimes MD   [facility] 10/29/2024, 10:58 AM      [1]  Allergies Allergen Reactions   Amlodipine  Other (See Comments)    Swelling of feet

## 2024-10-31 LAB — CULTURE, BLOOD (ROUTINE X 2)
Culture: NO GROWTH
Culture: NO GROWTH
Special Requests: ADEQUATE

## 2024-11-01 ENCOUNTER — Telehealth: Payer: Self-pay

## 2024-11-01 ENCOUNTER — Inpatient Hospital Stay: Payer: Self-pay | Admitting: Internal Medicine

## 2024-11-01 ENCOUNTER — Telehealth: Payer: Self-pay | Admitting: Family Medicine

## 2024-11-01 ENCOUNTER — Other Ambulatory Visit: Payer: Self-pay

## 2024-11-01 DIAGNOSIS — R790 Abnormal level of blood mineral: Secondary | ICD-10-CM

## 2024-11-01 DIAGNOSIS — E440 Moderate protein-calorie malnutrition: Secondary | ICD-10-CM

## 2024-11-01 DIAGNOSIS — J690 Pneumonitis due to inhalation of food and vomit: Secondary | ICD-10-CM

## 2024-11-01 NOTE — Telephone Encounter (Signed)
 Still in process due to Dr. Antonetta being out all last week

## 2024-11-01 NOTE — Telephone Encounter (Signed)
 Called patient to verify when and where FMLA was dropped off.  Per patient was given to nurse to give to Dr Antonetta on his appointment on 12.19.2025.  Please reach out to the patient when will be ready.   Copied from CRM [id number]. Topic: General - Other >> Oct 29, 2024 12:16 PM Deaijah H wrote: Reason for CRM: Patients wife called in to check if FMLA paperwork is ready for pick up. Please call

## 2024-11-01 NOTE — Telephone Encounter (Signed)
 Copied from CRM #[id number]. Topic: Clinical - Medical Advice >> Nov 01, 2024  8:11 AM Kevelyn M wrote: Patient's wife calling in patient discharged from hospital for Pneumonia on Friday 10/29/2024. He has oxygen. Wanted to get an appointment for 11/06/2023. No appointments available until 01/06. Patient has an appointment at [facility] on 11/12/2023. Patient's wife is now requesting blood work for the patient because that was told to her on discharge. She would like a nurse to call her back.  Call back #[phone]

## 2024-11-01 NOTE — Telephone Encounter (Signed)
"  error  "

## 2024-11-01 NOTE — Telephone Encounter (Signed)
 Ordered, pt wife informed

## 2024-11-01 NOTE — Telephone Encounter (Signed)
 Pls order stat CBC and diff, cmp and EGFR and magnesium  to be drawn at [facility] the morning of Jan 2, pls ask he get it done by 10 am so we can get the  result early in the DAY, DX IS PNEUMONIA, MALNUTRITION AND LOW MAGNESIUM 

## 2024-11-03 NOTE — Telephone Encounter (Signed)
 Completed, copied, placed up front. Pt wife informed

## 2024-11-03 NOTE — Telephone Encounter (Signed)
 Wife picked up forms.

## 2024-11-05 ENCOUNTER — Other Ambulatory Visit (HOSPITAL_COMMUNITY)
Admission: RE | Admit: 2024-11-05 | Discharge: 2024-11-05 | Disposition: A | Discharge status: Home / Self Care | Source: Ambulatory Visit | Attending: Family Medicine | Admitting: Family Medicine

## 2024-11-05 ENCOUNTER — Encounter: Payer: Self-pay | Admitting: Family Medicine

## 2024-11-05 ENCOUNTER — Ambulatory Visit (INDEPENDENT_AMBULATORY_CARE_PROVIDER_SITE_OTHER)

## 2024-11-05 ENCOUNTER — Ambulatory Visit: Payer: Self-pay | Admitting: Family Medicine

## 2024-11-05 DIAGNOSIS — I10 Essential (primary) hypertension: Secondary | ICD-10-CM | POA: Diagnosis not present

## 2024-11-05 DIAGNOSIS — E440 Moderate protein-calorie malnutrition: Secondary | ICD-10-CM | POA: Diagnosis present

## 2024-11-05 DIAGNOSIS — R790 Abnormal level of blood mineral: Secondary | ICD-10-CM | POA: Diagnosis present

## 2024-11-05 DIAGNOSIS — J690 Pneumonitis due to inhalation of food and vomit: Secondary | ICD-10-CM | POA: Diagnosis present

## 2024-11-05 LAB — CBC WITH DIFFERENTIAL/PLATELET
Abs Immature Granulocytes: 0.12 K/uL — ABNORMAL HIGH (ref 0.00–0.07)
Basophils Absolute: 0.1 K/uL (ref 0.0–0.1)
Basophils Relative: 1 %
Eosinophils Absolute: 0 K/uL (ref 0.0–0.5)
Eosinophils Relative: 0 %
HCT: 35.7 % — ABNORMAL LOW (ref 39.0–52.0)
Hemoglobin: 11.3 g/dL — ABNORMAL LOW (ref 13.0–17.0)
Immature Granulocytes: 2 %
Lymphocytes Relative: 17 %
Lymphs Abs: 1.4 K/uL (ref 0.7–4.0)
MCH: 27.4 pg (ref 26.0–34.0)
MCHC: 31.7 g/dL (ref 30.0–36.0)
MCV: 86.4 fL (ref 80.0–100.0)
Monocytes Absolute: 0.7 K/uL (ref 0.1–1.0)
Monocytes Relative: 9 %
Neutro Abs: 5.9 K/uL (ref 1.7–7.7)
Neutrophils Relative %: 71 %
Platelets: 463 K/uL — ABNORMAL HIGH (ref 150–400)
RBC: 4.13 MIL/uL — ABNORMAL LOW (ref 4.22–5.81)
RDW: 14.8 % (ref 11.5–15.5)
WBC: 8.2 K/uL (ref 4.0–10.5)
nRBC: 0 % (ref 0.0–0.2)

## 2024-11-05 LAB — COMPREHENSIVE METABOLIC PANEL WITH GFR
ALT: 100 U/L — ABNORMAL HIGH (ref 0–44)
AST: 54 U/L — ABNORMAL HIGH (ref 15–41)
Albumin: 3.1 g/dL — ABNORMAL LOW (ref 3.5–5.0)
Alkaline Phosphatase: 81 U/L (ref 38–126)
Anion gap: 11 (ref 5–15)
BUN: 8 mg/dL (ref 8–23)
CO2: 23 mmol/L (ref 22–32)
Calcium: 9 mg/dL (ref 8.9–10.3)
Chloride: 100 mmol/L (ref 98–111)
Creatinine, Ser: 0.87 mg/dL (ref 0.61–1.24)
GFR, Estimated: >60 mL/min
Glucose, Bld: 159 mg/dL — ABNORMAL HIGH (ref 70–99)
Potassium: 4 mmol/L (ref 3.5–5.1)
Sodium: 135 mmol/L (ref 135–145)
Total Bilirubin: 0.4 mg/dL (ref 0.0–1.2)
Total Protein: 8.1 g/dL (ref 6.5–8.1)

## 2024-11-05 LAB — MAGNESIUM: Magnesium: 1.7 mg/dL (ref 1.7–2.4)

## 2024-11-05 NOTE — Progress Notes (Signed)
 Patient is in office today for a nurse visit for Blood Pressure Check. Patient blood pressure was 133/77, Patient No chest pain, No shortness of breath, No dyspnea on exertion

## 2024-11-05 NOTE — Telephone Encounter (Signed)
 Pt wife informed, coming today

## 2024-11-08 ENCOUNTER — Telehealth: Payer: Self-pay

## 2024-11-08 NOTE — Telephone Encounter (Signed)
 Verbal orders given to sandra

## 2024-11-08 NOTE — Telephone Encounter (Signed)
 Copied from CRM [id number]. Topic: Clinical - Home Health Verbal Orders >> Nov 08, 2024  8:44 AM Antony RAMAN wrote: Caller/Agency: [facility] Callback Number: [phone] Service Requested: Skilled Nursing Frequency: Weekly x4 and everyother week x2 for mgmt of PE & CHF Any new concerns about the patient? No

## 2024-11-09 ENCOUNTER — Other Ambulatory Visit: Payer: Self-pay | Admitting: Student

## 2024-11-12 ENCOUNTER — Ambulatory Visit: Admitting: Student

## 2024-11-21 NOTE — Progress Notes (Unsigned)
 "  Cardiology Office Note    Date:  11/24/2024  ID:  Bryce Martinez, DOB April 04, 1951, MRN [id number] Cardiologist: Evaluated by Dr. Mona during admission but wants to follow-up in [facility]  History of Present Illness:    Bryce Martinez is a 74 y.o. male with past medical history of chronic HFrEF (EF 25-30% by echo in 09/2024), hepatic fibrosis in the setting of hepatitis C, GERD, osteoarthritis, HTN and HLD who presents to the office today for hospital follow-up.  The patient previously experienced a prolonged hospitalization from 11/24 - 10/12/2024 at [facility] for acute hypoxic respiratory failure due to ARDS in the setting of aspiration pneumonia requiring intubation.  Was found to have new cardiomyopathy with EF at 25 to 30% and felt to be due to his acute illness at that time. Was discharged to [facility] until 10/18/2024.  He had a recurrent admission from 12/22 - 10/29/2024 for recurrent acute hypoxic respiratory failure and was found to have an acute PE. Cardiology was consulted as his repeat echocardiogram during admission showed his EF did remain slightly reduced but had improved to 45 to 50%. Given his acute illness, it was recommended to hold off on adding SGLT2 inhibitor at that time and he was continued on Losartan . Was recommended to consider a repeat echocardiogram once further out from his acute illness for reassessment of his EF  In talking with the patient and his wife today, he reports significant improvement in his overall symptoms since his hospitalization. He is still having some shortness of breath if he overdoes it but this has significantly improved. He has not used supplemental oxygen in several weeks. Denies any specific chest pain or persistent palpitations. No recent orthopnea or PND. Does report occasional swelling along the top of his feet but no pitting edema along his legs. Was recently started on Hydralazine  at the [facility] given elevated BP and has been taking 10  mg twice daily.  His wife reports they have arranged for a repeat echocardiogram in 01/2025.  Studies Reviewed:   EKG: EKG is not ordered today.  Echocardiogram: 10/2024 IMPRESSIONS     1. Left ventricular ejection fraction, by estimation, is 45 to 50%. The  left ventricle has mildly decreased function. The left ventricle  demonstrates global hypokinesis. Left ventricular diastolic parameters are  consistent with Grade I diastolic  dysfunction (impaired relaxation).   2. Right ventricular systolic function is normal. The right ventricular  size is mildly enlarged. There is normal pulmonary artery systolic  pressure. The estimated right ventricular systolic pressure is 25.7 mmHg.   3. The mitral valve is normal in structure. No evidence of mitral valve  regurgitation. No evidence of mitral stenosis.   4. The aortic valve is normal in structure. Aortic valve regurgitation is  mild. No aortic stenosis is present.   5. The inferior vena cava is normal in size with greater than 50%  respiratory variability, suggesting right atrial pressure of 3 mmHg.    Risk Assessment/Calculations:   HYPERTENSION CONTROL Vitals:   11/23/24 1423 11/23/24 1455  BP: (!) 150/100 (!) 146/82    The patient's blood pressure is elevated above target today. In order to address the patient's elevated BP: A current anti-hypertensive medication was adjusted today.    Physical Exam:   VS:  BP (!) 146/82   Pulse 76   Ht 6' 1 (1.854 m)   Wt 170 lb (77.1 kg)   SpO2 98%   BMI 22.43 kg/m  Wt Readings from Last 3 Encounters:  11/23/24 170 lb (77.1 kg)  10/26/24 165 lb 12.6 oz (75.2 kg)  10/22/24 161 lb 0.6 oz (73 kg)     GEN: Well nourished, well developed male appearing in no acute distress NECK: No JVD; No carotid bruits CARDIAC: RRR, no murmurs, rubs, gallops RESPIRATORY:  Clear to auscultation without rales, wheezing or rhonchi  ABDOMEN: Appears non-distended. No obvious abdominal  masses. EXTREMITIES: No clubbing or cyanosis. No pitting edema.  Distal pedal pulses are 2+ bilaterally.   Assessment and Plan:   1. Heart failure with mildly reduced ejection fraction (HFmrEF) (HCC) - His EF was previously reduced at 25 to 30% in 09/2024 in the setting of his significant respiratory illness.  Had improved to 45 to 50% by repeat echocardiogram in 10/2024. We reviewed obtaining a follow-up echocardiogram in a few months for reassessment and his wife reports the [facility] has already scheduled a follow-up echocardiogram in 01/2025. - He appears euvolemic by examination today with no recent respiratory issues. For now, will continue Losartan  100 mg daily and will titrate Hydralazine  from 10 mg twice daily to 25 mg twice daily given his elevated BP. If EF remains reduced by repeat echocardiogram, would recommend adding an SGLT2i.  2. Essential hypertension - His BP was initially recorded at 150/100 today, rechecked personally and at 146/82. They report this has been elevated when checked at home as well. He was recently started on Hydralazine  10 mg twice daily by the [facility]. Will titrate to 25 mg twice daily and he was provided with a BP log as this may need to be further titrated. Continue Losartan  100 mg daily.  3. History of pulmonary embolus (PE) - He was diagnosed with a pulmonary embolism during his admission in 10/2024. He is on Eliquis  5 mg twice daily for anticoagulation with no reports of active bleeding. CBC on 11/05/2024 showed his hemoglobin was stable at 11.3 with platelets at 463 K.   Signed, Laymon CHRISTELLA Qua, PA-C   "

## 2024-11-23 ENCOUNTER — Ambulatory Visit: Attending: Student | Admitting: Student

## 2024-11-23 ENCOUNTER — Encounter: Payer: Self-pay | Admitting: Student

## 2024-11-23 VITALS — BP 146/82 | HR 76 | Ht 73.0 in | Wt 170.0 lb

## 2024-11-23 DIAGNOSIS — Z86711 Personal history of pulmonary embolism: Secondary | ICD-10-CM

## 2024-11-23 DIAGNOSIS — I5020 Unspecified systolic (congestive) heart failure: Secondary | ICD-10-CM

## 2024-11-23 DIAGNOSIS — I10 Essential (primary) hypertension: Secondary | ICD-10-CM | POA: Diagnosis not present

## 2024-11-23 MED ORDER — HYDRALAZINE HCL 25 MG PO TABS: 25.0000 mg | ORAL_TABLET | Freq: Two times a day (BID) | ORAL | 3 refills | Status: DC

## 2024-11-23 NOTE — Patient Instructions (Signed)
 Medication Instructions:   Increase Hydralazine  to 25mg  twice daily. Return BP Log in 3-4 weeks.   *If you need a refill on your cardiac medications before your next appointment, please call your pharmacy*   Follow-Up: At [facility], you and your health needs are our priority.  As part of our continuing mission to provide you with exceptional heart care, our providers are all part of one team.  This team includes your primary Cardiologist (physician) and Advanced Practice Providers or APPs (Physician Assistants and Nurse Practitioners) who all work together to provide you with the care you need, when you need it.  Your next appointment:   4 month(s)  Provider:   You may see None or one of the following Advanced Practice Providers on your designated Care Team:   Jaiveer Panas, PA-C  [place], [place] Olivia [place], [place]     We recommend signing up for the patient portal called MyChart.  Sign up information is provided on this After Visit Summary.  MyChart is used to connect with patients for Virtual Visits (Telemedicine).  Patients are able to view lab/test results, encounter notes, upcoming appointments, etc.  Non-urgent messages can be sent to your provider as well.   To learn more about what you can do with MyChart, go to [email or web address].   Other Instructions

## 2024-11-24 ENCOUNTER — Encounter: Payer: Self-pay | Admitting: Student

## 2024-11-29 ENCOUNTER — Ambulatory Visit (HOSPITAL_COMMUNITY)

## 2024-12-02 DIAGNOSIS — I5022 Chronic systolic (congestive) heart failure: Secondary | ICD-10-CM | POA: Diagnosis not present

## 2024-12-02 DIAGNOSIS — N529 Male erectile dysfunction, unspecified: Secondary | ICD-10-CM | POA: Diagnosis not present

## 2024-12-02 DIAGNOSIS — E559 Vitamin D deficiency, unspecified: Secondary | ICD-10-CM | POA: Diagnosis not present

## 2024-12-02 DIAGNOSIS — E440 Moderate protein-calorie malnutrition: Secondary | ICD-10-CM | POA: Diagnosis not present

## 2024-12-02 DIAGNOSIS — I2602 Saddle embolus of pulmonary artery with acute cor pulmonale: Secondary | ICD-10-CM | POA: Diagnosis not present

## 2024-12-02 DIAGNOSIS — H9190 Unspecified hearing loss, unspecified ear: Secondary | ICD-10-CM | POA: Diagnosis not present

## 2024-12-02 DIAGNOSIS — J309 Allergic rhinitis, unspecified: Secondary | ICD-10-CM | POA: Diagnosis not present

## 2024-12-02 DIAGNOSIS — J189 Pneumonia, unspecified organism: Secondary | ICD-10-CM | POA: Diagnosis not present

## 2024-12-02 DIAGNOSIS — I2699 Other pulmonary embolism without acute cor pulmonale: Secondary | ICD-10-CM | POA: Diagnosis not present

## 2024-12-02 DIAGNOSIS — J9601 Acute respiratory failure with hypoxia: Secondary | ICD-10-CM | POA: Diagnosis not present

## 2024-12-02 DIAGNOSIS — I110 Hypertensive heart disease with heart failure: Secondary | ICD-10-CM | POA: Diagnosis not present

## 2024-12-02 DIAGNOSIS — F4323 Adjustment disorder with mixed anxiety and depressed mood: Secondary | ICD-10-CM | POA: Diagnosis not present

## 2024-12-03 ENCOUNTER — Encounter: Payer: Self-pay | Admitting: Internal Medicine

## 2024-12-03 ENCOUNTER — Ambulatory Visit: Admitting: Internal Medicine

## 2024-12-03 ENCOUNTER — Encounter: Payer: Self-pay | Admitting: Student

## 2024-12-03 ENCOUNTER — Telehealth: Payer: Self-pay | Admitting: Student

## 2024-12-03 VITALS — BP 144/90 | HR 84 | Ht 73.0 in | Wt 169.8 lb

## 2024-12-03 DIAGNOSIS — I2609 Other pulmonary embolism with acute cor pulmonale: Secondary | ICD-10-CM | POA: Diagnosis not present

## 2024-12-03 DIAGNOSIS — I10 Essential (primary) hypertension: Secondary | ICD-10-CM

## 2024-12-03 DIAGNOSIS — R058 Other specified cough: Secondary | ICD-10-CM | POA: Diagnosis not present

## 2024-12-03 DIAGNOSIS — Z87891 Personal history of nicotine dependence: Secondary | ICD-10-CM | POA: Diagnosis not present

## 2024-12-03 DIAGNOSIS — I824Y9 Acute embolism and thrombosis of unspecified deep veins of unspecified proximal lower extremity: Secondary | ICD-10-CM

## 2024-12-03 MED ORDER — METOPROLOL TARTRATE 25 MG PO TABS: 25.0000 mg | ORAL_TABLET | Freq: Two times a day (BID) | ORAL | 11 refills | Status: AC

## 2024-12-03 MED ORDER — BENZONATATE 200 MG PO CAPS: 200.0000 mg | ORAL_CAPSULE | Freq: Three times a day (TID) | ORAL | 1 refills | Status: AC | PRN

## 2024-12-03 MED ORDER — HYDRALAZINE HCL 25 MG PO TABS: 25.0000 mg | ORAL_TABLET | Freq: Two times a day (BID) | ORAL | 3 refills | Status: AC

## 2024-12-03 NOTE — Patient Instructions (Addendum)
 Last week in Feb 2026, you need a venous doppler  at [facility] both legs to make sure the clots are gone before considering reducing or stopping Eliquis    Metaprolol 25 mg twice daily (with hydralazine )  Tessalon  200 mg 3 x daily as needed   GERD (REFLUX)  is an extremely common cause of respiratory symptoms just like yours, many times with no obvious heartburn at all.    It can be treated with medication, but also with lifestyle changes including elevation of the head of your bed (ideally with 6 -8inch blocks under the headboard of your bed),  Smoking cessation, avoidance of late meals, excessive alcohol, and avoid fatty foods, chocolate, peppermint, colas, red wine, and acidic juices such as orange juice.  NO MINT OR MENTHOL  PRODUCTS SO NO COUGH DROPS  USE SUGARLESS CANDY INSTEAD (Jolley ranchers or Stover's or Life Savers) or even ice chips will also do - the key is to swallow to prevent all throat clearing. NO OIL BASED VITAMINS - use powdered substitutes.  Avoid fish oil when coughing.   Since you will be following up with the [facility] doctor for your eliquis , pulmonary follow up in this office is as needed.

## 2024-12-03 NOTE — Telephone Encounter (Signed)
 Refill sent to local pharmacy,patient made aware

## 2024-12-03 NOTE — Progress Notes (Unsigned)
 "   Bryce Martinez, male    DOB: October 21, 1951    MRN: [id number]   Brief patient profile:  73  yobm quit smoking 1973  referred to pulmonary clinic in [place]  12/03/2024 by Dr Antonetta  for severe PE / R DVT    On CCM service  10/2024     presented to [facility] 12/22 as a transfer from [facility] for dyspnea secondary to PE. 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 Recent admission to [facility] 11/24-12/9 for ARDS secondary to aspiration pneumonia, requiring mechanical ventilation 11/24-12/1.  During that admission, found to have systolic CHF with Echo revealing LVEF 25 to 30% and global hypokinesis; also incidentally found to be COVID-positive. Due to generalized deconditioning/critical illness myopathy, he was discharged to CIR on 12/9 and remained admitted there through 12/15.    Admit date: 10/25/2024 Discharge date: 10/29/2024  Recommendations for Outpatient Follow-up:  Needs uninterrupted anticoagulation X 3 months then decision about prolonging per PCP at the [facility] Recommend outpatient cirrhosis testing at the [facility] with [facility]roSure if not done already Complete antibiotics doxycycline  going forward for completion of treatment of possible pneumonia SGLT2 discontinued will need outpatient follow-up for the same   Discharge Diagnoses:  MAIN problem for hospitalization    Acute pulmonary embolism PEsi score 92 Superimposed underlying pneumonia   Please see below for itemized issues addressed in HOpsital- refer to other progress notes for clarity if needed   Discharge Condition: Improved   Diet recommendation: Heart healthy       Filed Weights    10/25/24 1429 10/26/24 0600  Weight: 73 kg 75.2 kg      History of present illness:  74 year old male Recent d/c-- ARDS s/p colonoscopy-(transferred from [facility]--- intubated until 12/1-completing antibiotics-new systolic CHF EF 25-30%--also coincidental  COVID-positive) Known advanced fibrosis with hep C Discharged to CIR 12/12--- 12/18-discharged on Protonix  Keflex  azithromycin  and salt tablets [facility]felt LLL PNA-started Z-Pak 12/22 seen at [facility],DOE sat 64% diameter >20 BNP 2291--CT chest moderate volume occlusive nonocclusive PE right lung             Transferred to the ICU 12/24 transferred to [facility]      Assessment  & Plan :      Multifactorial respiratory failure acute hypoxic type I SLP has cleared him to eat and he is tolerating the same pretty well Chest pain is improved he will need oxygen going forward at discharge and will need rehabilitation from his recent multiple hospital stays   Pulmonary-PESI score 92 class III intermediate risk DVT LCFv VLFv, right popliteal vein Heparin  GTT transition 12/25-->loading dose Eliquis  , and will continue this for at least 90 days uninterrupted Defer to PCP to continue the same   HFrEF-->HFmrEF normal RV EF 50% grade 1 DD this admission Stress-induced cardiomyopathyhold on SGLT2 Continues losartan  100 daily   Recent L LL pneumonia This hospitalization resumed on antibiotics 12/22  Initially was on ceftriaxone  and broad spectrum coverage IV--discontinuing and is now on doxycycline  100 BID-narrowed to complete as blood culture X2 from admission NGTD   Overall debility Frailty in the setting of critical illness myopathy Will need further work in the hospital to rehabilitate and strengthen him-outpatient ambulatory referral placed to rehab      History of Present Illness  12/03/2024  post hosp f/u /  Pulmonary/ 1st office eval/ [place] / [place] [facility]  home since 10/29/24 does outpt  pulmonary rehab  Chief Complaint  Patient presents with   Consult    Referred by Dr. Rollene Pesa. Pt recently had aspiration PNA during colonoscopy. His breathing has improved since d/c from the hospitalization, but not back to baseline.   Dyspnea:  baseline = weed eating ok/  hauling wood not back yet  Cough: since aspiration min productive but sense of pnds  Sleep: bed is flat/ two pillows minimal cough  SABA use: none  02: none     No obvious day to day or daytime pattern/variability or assoc excess/ purulent sputum or mucus plugs or hemoptysis or cp or chest tightness, subjective wheeze or overt sinus or hb symptoms.    Also denies any obvious fluctuation of symptoms with weather or environmental changes or other aggravating or alleviating factors except as outlined above   No unusual exposure hx or h/o childhood pna/ asthma or knowledge of premature birth.  Current Allergies, Complete Past Medical History, Past Surgical History, Family History, and Social History were reviewed in [employer] record.  ROS  The following are not active complaints unless bolded Hoarseness, sore throat, dysphagia, dental problems, itching, sneezing,  nasal congestion or discharge of excess mucus or purulent secretions, ear ache,   fever, chills, sweats, unintended wt loss or wt gain, classically pleuritic or exertional cp,  orthopnea pnd or arm/hand swelling  or leg swelling, presyncope, palpitations, abdominal pain, anorexia, nausea, vomiting, diarrhea  or change in bowel habits or change in bladder habits, change in stools or change in urine, dysuria, hematuria,  rash, arthralgias, visual complaints, headache, numbness, weakness or ataxia or problems with walking or coordination,  change in mood or  memory.            Outpatient Medications Prior to Visit  Medication Sig Dispense Refill   apixaban  (ELIQUIS ) 5 MG TABS tablet Take 2 tablets (10 mg total) by mouth 2 (two) times daily for 6 days, THEN 1 tablet (5 mg total) 2 (two) times daily. 204 tablet 0   guaiFENesin  (MUCINEX ) 600 MG 12 hr tablet Take 600 mg by mouth in the morning and at bedtime.     hydrALAZINE  (APRESOLINE ) 25 MG tablet Take 1 tablet (25 mg total) by mouth 2 (two) times daily. 180 tablet  3   lidocaine  (LIDODERM ) 5 % Place 1 patch onto the skin daily. Remove & Discard patch within 12 hours or as directed by MD 30 patch 0   losartan  (COZAAR ) 100 MG tablet TAKE 1 TABLET BY MOUTH EVERY DAY 90 tablet 1   pantoprazole  (PROTONIX ) 40 MG tablet Take 1 tablet (40 mg total) by mouth daily before breakfast. 30 tablet 0   polyethylene glycol (MIRALAX  / GLYCOLAX ) 17 g packet Take 17 g by mouth daily.     senna-docusate (SENOKOT-S) 8.6-50 MG tablet Take 2 tablets by mouth 2 (two) times daily.     sodium chloride  (OCEAN) 0.65 % SOLN nasal spray Place 1 spray into both nostrils as needed for congestion.     traZODone  (DESYREL ) 50 MG tablet Take 1 tablet (50 mg total) by mouth at bedtime. 30 tablet 0   No facility-administered medications prior to visit.    Past Medical History:  Diagnosis Date   Allergy    Arthritis    ED (erectile dysfunction)    GERD (gastroesophageal reflux disease)    Occasionally   Hepatitis C    Hyperlipidemia    Hypertension       Objective:  BP (!) 144/90   Pulse 84   Ht 6' 1 (1.854 m)   Wt 169 lb 12.8 oz (77 kg)   SpO2 99% Comment: on RA  BMI 22.40 kg/m   SpO2: 99 % (on RA) pleasant amb bm nad   HEENT : Oropharynx  ***     Nasal turbinates ***   NECK :  without  apparent JVD/ palpable Nodes/TM    LUNGS: no acc muscle use,  Nl contour chest which is clear to A and P bilaterally without cough on insp or exp maneuvers   CV:  Loud S1S2  no s3 or murmur and no  increase in P2, and no edema   ABD:  soft and nontender   MS:  Gait ***  ext warm without deformities Or obvious joint restrictions  calf tenderness, cyanosis or clubbing    SKIN: warm and dry without lesions    NEURO:  alert, approp, nl sensorium with  no motor or cerebellar deficits apparent.       Assessment       "

## 2024-12-03 NOTE — Telephone Encounter (Signed)
 Pt's wife came into the office and stated that they are having a hard time getting the new rx for hydrALAZINE  (APRESOLINE ) 25 MG tablet  filled at the [place]. She is wanting to know if it can be sent to [facility] on [address] in [place]. [phone] is best number to reach someone.

## 2024-12-04 DIAGNOSIS — R058 Other specified cough: Secondary | ICD-10-CM | POA: Insufficient documentation

## 2024-12-04 NOTE — Assessment & Plan Note (Addendum)
 Develpped p intubation Nov 2025 p asp pna - already on ppi as of 12/03/2024 pulmonary post hosp f/u  >>> added tessalon  200 and GERD diet reviewed, bed blocks    Discussed in detail all the  indications, usual  risks and alternatives  relative to the benefits with patient who agrees to proceed with Rx as outlined.        Each maintenance medication was reviewed in detail including emphasizing most importantly the difference between maintenance and prns and under what circumstances the prns are to be triggered using an action plan format where appropriate.  Total time for H and P, chart review, counseling and generating customized AVS unique to this office visit / same day charting = 46 min with post hosp pt new to me p 2 complex life threatening recent admits reviewed with pt.

## 2024-12-04 NOTE — Assessment & Plan Note (Addendum)
 Not optimal control with loud S1S2 on exam while on vasodilatrs alone so rec  >>> add metaprolol 25 mg bid with f/u by PCP here and at [facility]

## 2024-12-04 NOTE — Assessment & Plan Note (Addendum)
 Dx 10/25/24 In setting of admit 09/27/24  with  Aspiratoin pna/vent dep  and assoc with  development of bilateral DVT/ pos covid testing  -  CTa 10/25/24 suggested RVE but Echo 10/26/24 s significant RVE and RVSP  28 / nl RA  He is not back to baseline in terms of ex tol  but making steady progress with Echo p starting anticoagulation encouraging and so echo does not need to be repeated as long as full recovery complete by 6 months   - I would rec repeat venous dopplers before considering reducing or stopping Eliquis  so  >>> ordered Venous dopplers for the end of Feb 2026 anticiipating return to [place] in March 2026 for re-eval re length of eliquis  rx for this  provokedDVT /PE with no other obvious risk factors for recurrent clotting.

## 2024-12-06 ENCOUNTER — Ambulatory Visit: Payer: Self-pay | Admitting: Student

## 2024-12-23 ENCOUNTER — Ambulatory Visit: Payer: Self-pay | Admitting: Family Medicine

## 2024-12-29 ENCOUNTER — Ambulatory Visit

## 2025-03-23 ENCOUNTER — Ambulatory Visit: Admitting: Student

## 2025-07-04 ENCOUNTER — Ambulatory Visit
